# Patient Record
Sex: Female | Born: 1980 | Hispanic: No | Smoking: Never smoker
Health system: Southern US, Community
[De-identification: ages and names within clinical notes are randomized; demographics above are authoritative.]

## PROBLEM LIST (undated history)

## (undated) ENCOUNTER — Inpatient Hospital Stay (HOSPITAL_COMMUNITY): Payer: Self-pay

## (undated) DIAGNOSIS — E119 Type 2 diabetes mellitus without complications: Secondary | ICD-10-CM

## (undated) DIAGNOSIS — O24419 Gestational diabetes mellitus in pregnancy, unspecified control: Secondary | ICD-10-CM

## (undated) DIAGNOSIS — C73 Malignant neoplasm of thyroid gland: Secondary | ICD-10-CM

## (undated) DIAGNOSIS — E7800 Pure hypercholesterolemia, unspecified: Secondary | ICD-10-CM

## (undated) DIAGNOSIS — E0590 Thyrotoxicosis, unspecified without thyrotoxic crisis or storm: Secondary | ICD-10-CM

## (undated) HISTORY — DX: Malignant neoplasm of thyroid gland: C73

## (undated) HISTORY — PX: NO PAST SURGERIES: SHX2092

## (undated) HISTORY — DX: Gestational diabetes mellitus in pregnancy, unspecified control: O24.419

---
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
## 2010-01-26 ENCOUNTER — Ambulatory Visit
Admission: RE | Admit: 2010-01-26 | Payer: Self-pay | Source: Home / Self Care | Attending: Obstetrics & Gynecology | Admitting: Obstetrics & Gynecology

## 2010-01-26 ENCOUNTER — Encounter: Payer: Self-pay | Admitting: Physician Assistant

## 2010-01-26 LAB — CONVERTED CEMR LAB: TSH: 19.331 microintl units/mL — ABNORMAL HIGH (ref 0.350–4.500)

## 2010-03-03 ENCOUNTER — Ambulatory Visit (INDEPENDENT_AMBULATORY_CARE_PROVIDER_SITE_OTHER): Payer: Self-pay | Admitting: Obstetrics and Gynecology

## 2010-03-03 DIAGNOSIS — E039 Hypothyroidism, unspecified: Secondary | ICD-10-CM

## 2013-09-10 ENCOUNTER — Encounter (HOSPITAL_COMMUNITY): Payer: Self-pay | Admitting: Emergency Medicine

## 2013-09-10 ENCOUNTER — Emergency Department (HOSPITAL_COMMUNITY)
Admission: EM | Admit: 2013-09-10 | Discharge: 2013-09-10 | Disposition: A | Discharge status: Home / Self Care | Payer: Self-pay | Attending: Emergency Medicine | Admitting: Emergency Medicine

## 2013-09-10 DIAGNOSIS — Z3202 Encounter for pregnancy test, result negative: Secondary | ICD-10-CM | POA: Insufficient documentation

## 2013-09-10 DIAGNOSIS — R51 Headache: Secondary | ICD-10-CM | POA: Insufficient documentation

## 2013-09-10 DIAGNOSIS — R519 Headache, unspecified: Secondary | ICD-10-CM

## 2013-09-10 DIAGNOSIS — R197 Diarrhea, unspecified: Secondary | ICD-10-CM | POA: Insufficient documentation

## 2013-09-10 DIAGNOSIS — R1084 Generalized abdominal pain: Secondary | ICD-10-CM | POA: Insufficient documentation

## 2013-09-10 DIAGNOSIS — R112 Nausea with vomiting, unspecified: Secondary | ICD-10-CM | POA: Insufficient documentation

## 2013-09-10 DIAGNOSIS — Z79899 Other long term (current) drug therapy: Secondary | ICD-10-CM | POA: Insufficient documentation

## 2013-09-10 LAB — COMPREHENSIVE METABOLIC PANEL
ALBUMIN: 3.9 g/dL (ref 3.5–5.2)
ALT: 31 U/L (ref 0–35)
AST: 34 U/L (ref 0–37)
Alkaline Phosphatase: 84 U/L (ref 39–117)
Anion gap: 17 — ABNORMAL HIGH (ref 5–15)
BUN: 10 mg/dL (ref 6–23)
CHLORIDE: 100 meq/L (ref 96–112)
CO2: 20 meq/L (ref 19–32)
Calcium: 9.9 mg/dL (ref 8.4–10.5)
Creatinine, Ser: 0.73 mg/dL (ref 0.50–1.10)
GFR calc Af Amer: >90 mL/min (ref >90)
Glucose, Bld: 248 mg/dL — ABNORMAL HIGH (ref 70–99)
Potassium: 4.5 mEq/L (ref 3.7–5.3)
SODIUM: 137 meq/L (ref 137–147)
Total Bilirubin: 0.3 mg/dL (ref 0.3–1.2)
Total Protein: 8.3 g/dL (ref 6.0–8.3)

## 2013-09-10 LAB — URINALYSIS, ROUTINE W REFLEX MICROSCOPIC
Bilirubin Urine: NEGATIVE
Glucose, UA: >1000 mg/dL — AB
Ketones, ur: NEGATIVE mg/dL
Nitrite: NEGATIVE
PH: 6 (ref 5.0–8.0)
PROTEIN: NEGATIVE mg/dL
Specific Gravity, Urine: 1.01 (ref 1.005–1.030)
Urobilinogen, UA: 0.2 mg/dL (ref 0.0–1.0)

## 2013-09-10 LAB — URINE MICROSCOPIC-ADD ON

## 2013-09-10 LAB — LIPASE, BLOOD: LIPASE: 32 U/L (ref 11–59)

## 2013-09-10 LAB — POC URINE PREG, ED: Preg Test, Ur: NEGATIVE

## 2013-09-10 MED ORDER — KETOROLAC TROMETHAMINE 30 MG/ML IJ SOLN
15.0000 mg | Freq: Once | INTRAMUSCULAR | Status: AC
  Administered 2013-09-10: 15 mg via INTRAVENOUS
  Filled 2013-09-10: qty 1

## 2013-09-10 MED ORDER — DIPHENHYDRAMINE HCL 50 MG/ML IJ SOLN
25.0000 mg | Freq: Once | INTRAMUSCULAR | Status: AC
  Administered 2013-09-10: 25 mg via INTRAVENOUS
  Filled 2013-09-10: qty 1

## 2013-09-10 MED ORDER — SODIUM CHLORIDE 0.9 % IV BOLUS (SEPSIS)
1000.0000 mL | Freq: Once | INTRAVENOUS | Status: AC
  Administered 2013-09-10: 1000 mL via INTRAVENOUS

## 2013-09-10 MED ORDER — METOCLOPRAMIDE HCL 5 MG/ML IJ SOLN
10.0000 mg | Freq: Once | INTRAMUSCULAR | Status: AC
  Administered 2013-09-10: 10 mg via INTRAVENOUS
  Filled 2013-09-10: qty 2

## 2013-09-10 NOTE — ED Notes (Signed)
Pt c/o headache, generalized body aches, and n/v x 3 days.  Pain score 10/10.  Denies blurred.

## 2013-09-10 NOTE — ED Provider Notes (Signed)
CSN: [id number]     Arrival date & time 09/10/13  2 History   First MD Initiated Contact with Patient 09/10/13 1110     Chief Complaint  Patient presents with  . Headache  . Generalized Body Aches      HPI Patient presents with 3 days of generalized discomfort, abdominal pain, nausea, vomiting, diarrhea. (Symptoms she was in her usual state of health. Patient is generally well, has no recent travel, sick contacts. She does endorse increased intake of pork products. She does not smoke, does not drink. Since onset no relief with OTC medication or any other medications.  History reviewed. No pertinent past medical history. History reviewed. No pertinent past surgical history. History reviewed. No pertinent family history. History  Substance Use Topics  . Smoking status: Never Smoker   . Smokeless tobacco: Not on file  . Alcohol Use: Yes     Comment: occ   OB History   Grav Para Term Preterm Abortions TAB SAB Ect Mult Living                 Review of Systems  Constitutional:       Per HPI, otherwise negative  HENT:       Per HPI, otherwise negative  Respiratory:       Per HPI, otherwise negative  Cardiovascular:       Per HPI, otherwise negative  Gastrointestinal: Positive for nausea, vomiting and diarrhea.  Endocrine:       Negative aside from HPI  Genitourinary:       Neg aside from HPI   Musculoskeletal:       Per HPI, otherwise negative  Skin: Negative.   Neurological: Positive for headaches. Negative for syncope and weakness.      Allergies  Review of patient's allergies indicates no known allergies.  Home Medications   Prior to Admission medications   Medication Sig Start Date End Date Taking? Authorizing Provider  atorvastatin (LIPITOR) 40 MG tablet Take 40 mg by mouth daily.   Yes Historical Provider, MD  metFORMIN (GLUCOPHAGE) 1000 MG tablet Take 1,000 mg by mouth 2 (two) times daily with a meal.   Yes Historical Provider, MD  pioglitazone  (ACTOS) 15 MG tablet Take 15 mg by mouth daily.   Yes Historical Provider, MD   BP 135/80  Pulse 92  Temp(Src) 99.2 F (37.3 C) (Oral)  Resp 16  SpO2 100%  LMP 09/02/2013 Physical Exam  Nursing note and vitals reviewed. Constitutional: She is oriented to person, place, and time. She appears well-developed and well-nourished. No distress.  HENT:  Head: Normocephalic and atraumatic.  Eyes: Conjunctivae and EOM are normal.  Cardiovascular: Normal rate and regular rhythm.   Pulmonary/Chest: Effort normal and breath sounds normal. No stridor. No respiratory distress.  Abdominal: She exhibits no distension. There is no hepatosplenomegaly. There is generalized tenderness. There is no rigidity and no guarding.  Patient is a soft, non-peritoneal abdomen, there is mild pain with palpation throughout  Musculoskeletal: She exhibits no edema.  Neurological: She is alert and oriented to person, place, and time. She displays no atrophy and no tremor. No cranial nerve deficit or sensory deficit. She exhibits normal muscle tone. She displays no seizure activity. Coordination normal.  Skin: Skin is warm and dry.  Psychiatric: She has a normal mood and affect.    ED Course  Procedures (including critical care time) Labs Review Labs Reviewed  COMPREHENSIVE METABOLIC PANEL - Abnormal; Notable for the following:  Glucose, Bld 248 (*)    Anion gap 17 (*)    All other components within normal limits  URINALYSIS, ROUTINE W REFLEX MICROSCOPIC - Abnormal; Notable for the following:    Glucose, UA >1000 (*)    Hgb urine dipstick MODERATE (*)    Leukocytes, UA TRACE (*)    All other components within normal limits  LIPASE, BLOOD  URINE MICROSCOPIC-ADD ON  POC URINE PREG, ED     1:27 PM Patient substantially improved, no ongoing pain.  MDM   Patient presents with headache, no neurologic dysfunction, no other focal complaints.  Patient has substantial improvement here, and retroflex for occult  neurologic cause, nor systemic infection.  Patient was discharged in stable condition  Carmin Muskrat, MD 09/10/13 1328

## 2013-09-17 ENCOUNTER — Emergency Department (HOSPITAL_COMMUNITY): Payer: Self-pay

## 2013-09-17 ENCOUNTER — Emergency Department (HOSPITAL_COMMUNITY)
Admission: EM | Admit: 2013-09-17 | Discharge: 2013-09-17 | Disposition: A | Discharge status: Home / Self Care | Payer: Self-pay | Attending: Emergency Medicine | Admitting: Emergency Medicine

## 2013-09-17 ENCOUNTER — Encounter (HOSPITAL_COMMUNITY): Payer: Self-pay | Admitting: Emergency Medicine

## 2013-09-17 DIAGNOSIS — R109 Unspecified abdominal pain: Secondary | ICD-10-CM | POA: Insufficient documentation

## 2013-09-17 DIAGNOSIS — Z3202 Encounter for pregnancy test, result negative: Secondary | ICD-10-CM | POA: Insufficient documentation

## 2013-09-17 DIAGNOSIS — Z792 Long term (current) use of antibiotics: Secondary | ICD-10-CM | POA: Insufficient documentation

## 2013-09-17 DIAGNOSIS — Z79899 Other long term (current) drug therapy: Secondary | ICD-10-CM | POA: Insufficient documentation

## 2013-09-17 DIAGNOSIS — E119 Type 2 diabetes mellitus without complications: Secondary | ICD-10-CM | POA: Insufficient documentation

## 2013-09-17 DIAGNOSIS — R110 Nausea: Secondary | ICD-10-CM | POA: Insufficient documentation

## 2013-09-17 DIAGNOSIS — E7800 Pure hypercholesterolemia, unspecified: Secondary | ICD-10-CM | POA: Insufficient documentation

## 2013-09-17 DIAGNOSIS — N289 Disorder of kidney and ureter, unspecified: Secondary | ICD-10-CM | POA: Insufficient documentation

## 2013-09-17 HISTORY — DX: Pure hypercholesterolemia, unspecified: E78.00

## 2013-09-17 HISTORY — DX: Type 2 diabetes mellitus without complications: E11.9

## 2013-09-17 LAB — URINE MICROSCOPIC-ADD ON

## 2013-09-17 LAB — COMPREHENSIVE METABOLIC PANEL
ALT: 20 U/L (ref 0–35)
AST: 9 U/L (ref 0–37)
Albumin: 3.9 g/dL (ref 3.5–5.2)
Alkaline Phosphatase: 92 U/L (ref 39–117)
Anion gap: 17 — ABNORMAL HIGH (ref 5–15)
BUN: 8 mg/dL (ref 6–23)
CO2: 27 mEq/L (ref 19–32)
Calcium: 10.2 mg/dL (ref 8.4–10.5)
Chloride: 100 mEq/L (ref 96–112)
Creatinine, Ser: 1.23 mg/dL — ABNORMAL HIGH (ref 0.50–1.10)
GFR calc Af Amer: 67 mL/min — ABNORMAL LOW (ref >90)
GFR calc non Af Amer: 58 mL/min — ABNORMAL LOW (ref >90)
Glucose, Bld: 223 mg/dL — ABNORMAL HIGH (ref 70–99)
Potassium: 3.1 mEq/L — ABNORMAL LOW (ref 3.7–5.3)
Sodium: 144 mEq/L (ref 137–147)
Total Bilirubin: 1 mg/dL (ref 0.3–1.2)
Total Protein: 8.1 g/dL (ref 6.0–8.3)

## 2013-09-17 LAB — URINALYSIS, ROUTINE W REFLEX MICROSCOPIC
Bilirubin Urine: NEGATIVE
Glucose, UA: NEGATIVE mg/dL
Ketones, ur: NEGATIVE mg/dL
Leukocytes, UA: NEGATIVE
Nitrite: NEGATIVE
Protein, ur: NEGATIVE mg/dL
Specific Gravity, Urine: 1.003 — ABNORMAL LOW (ref 1.005–1.030)
Urobilinogen, UA: 0.2 mg/dL (ref 0.0–1.0)
pH: 6.5 (ref 5.0–8.0)

## 2013-09-17 LAB — CBC WITH DIFFERENTIAL/PLATELET
Basophils Absolute: 0 10*3/uL (ref 0.0–0.1)
Basophils Relative: 0 % (ref 0–1)
Eosinophils Absolute: 0.1 10*3/uL (ref 0.0–0.7)
Eosinophils Relative: 1 % (ref 0–5)
HCT: 36.7 % (ref 36.0–46.0)
Hemoglobin: 12.6 g/dL (ref 12.0–15.0)
Lymphocytes Relative: 29 % (ref 12–46)
Lymphs Abs: 3.3 10*3/uL (ref 0.7–4.0)
MCH: 29.2 pg (ref 26.0–34.0)
MCHC: 34.3 g/dL (ref 30.0–36.0)
MCV: 85.2 fL (ref 78.0–100.0)
Monocytes Absolute: 0.6 10*3/uL (ref 0.1–1.0)
Monocytes Relative: 5 % (ref 3–12)
Neutro Abs: 7.4 10*3/uL (ref 1.7–7.7)
Neutrophils Relative %: 65 % (ref 43–77)
Platelets: 313 10*3/uL (ref 150–400)
RBC: 4.31 MIL/uL (ref 3.87–5.11)
RDW: 12 % (ref 11.5–15.5)
WBC: 11.5 10*3/uL — ABNORMAL HIGH (ref 4.0–10.5)

## 2013-09-17 LAB — POC URINE PREG, ED: Preg Test, Ur: NEGATIVE

## 2013-09-17 LAB — LIPASE, BLOOD: Lipase: 32 U/L (ref 11–59)

## 2013-09-17 MED ORDER — IOHEXOL 300 MG/ML  SOLN
100.0000 mL | Freq: Once | INTRAMUSCULAR | Status: AC | PRN
  Administered 2013-09-17: 100 mL via INTRAVENOUS

## 2013-09-17 MED ORDER — PROMETHAZINE HCL 25 MG/ML IJ SOLN
12.5000 mg | Freq: Once | INTRAMUSCULAR | Status: AC
  Administered 2013-09-17: 12.5 mg via INTRAVENOUS
  Filled 2013-09-17: qty 1

## 2013-09-17 MED ORDER — ONDANSETRON HCL 4 MG PO TABS: 4.0000 mg | ORAL_TABLET | Freq: Three times a day (TID) | ORAL | Status: DC | PRN

## 2013-09-17 MED ORDER — ONDANSETRON HCL 4 MG/2ML IJ SOLN
4.0000 mg | Freq: Once | INTRAMUSCULAR | Status: AC
  Administered 2013-09-17: 4 mg via INTRAVENOUS
  Filled 2013-09-17: qty 2

## 2013-09-17 MED ORDER — SODIUM CHLORIDE 0.9 % IV BOLUS (SEPSIS)
1000.0000 mL | Freq: Once | INTRAVENOUS | Status: AC
  Administered 2013-09-17: 1000 mL via INTRAVENOUS

## 2013-09-17 MED ORDER — MORPHINE SULFATE 4 MG/ML IJ SOLN
6.0000 mg | Freq: Once | INTRAMUSCULAR | Status: AC
  Administered 2013-09-17: 6 mg via INTRAVENOUS
  Filled 2013-09-17: qty 2

## 2013-09-17 MED ORDER — POTASSIUM CHLORIDE CRYS ER 20 MEQ PO TBCR
60.0000 meq | EXTENDED_RELEASE_TABLET | Freq: Once | ORAL | Status: DC
  Filled 2013-09-17: qty 3

## 2013-09-17 MED ORDER — POTASSIUM CHLORIDE 10 MEQ/100ML IV SOLN
10.0000 meq | INTRAVENOUS | Status: AC
  Administered 2013-09-17 (×2): 10 meq via INTRAVENOUS
  Filled 2013-09-17 (×2): qty 100

## 2013-09-17 MED ORDER — OXYCODONE HCL 5 MG PO TABS: 5.0000 mg | ORAL_TABLET | ORAL | Status: DC | PRN

## 2013-09-17 NOTE — Discharge Instructions (Signed)
Stop taking Lipitor at this time until you discuss further with your PCP. Do not take NSAIDs such a ibuprofen, naproxen, etc. You are being prescribed a pain medication to take as needed. Stop taking amoxicillin. Your urine today is not consistent with a urinary tract infection. You need to have repeat blood work(basic metabolic panel) by the end of next week. If you develop swelling, decreased urination or other symptoms as discussed you need to be re-evaluated as soon as you can. Otherwise you need to make an appointment to see your PCP. If you do not have one then please see attached resource list and make an appointment with the [facility] and [place].    Emergency Department Resource Guide 1) Find a Doctor and Pay Out of Pocket Although you won't have to find out who is covered by your insurance plan, it is a good idea to ask around and get recommendations. You will then need to call the office and see if the doctor you have chosen will accept you as a new patient and what types of options they offer for patients who are self-pay. Some doctors offer discounts or will set up payment plans for their patients who do not have insurance, but you will need to ask so you aren't surprised when you get to your appointment.  2) Contact Your Local Health Department Not all health departments have doctors that can see patients for sick visits, but many do, so it is worth a call to see if yours does. If you don't know where your local health department is, you can check in your phone book. The CDC also has a tool to help you locate your state's health department, and many state websites also have listings of all of their local health departments.  3) Find a [facility] If your illness is not likely to be very severe or complicated, you may want to try a walk in clinic. These are popping up all over the country in pharmacies, drugstores, and shopping centers. They're usually staffed by nurse  practitioners or physician assistants that have been trained to treat common illnesses and complaints. They're usually fairly quick and inexpensive. However, if you have serious medical issues or chronic medical problems, these are probably not your best option.  No Primary Care Doctor: - Call Health Connect at  [phone] - they can help you locate a primary care doctor that  accepts your insurance, provides certain services, etc. - Physician Referral Service- [phone]  Chronic Pain Problems: Organization         Address  Phone   Notes  [facility]  [phone] Patients need to be referred by their primary care doctor.   Medication Assistance: Organization         Address  Phone   Notes  [facility] Medication [facility] [address] [place], [place] [address] [phone] --Must be a resident of [facility] -- Must have NO insurance coverage whatsoever (no Medicaid/ Medicare, etc.) -- The pt. MUST have a primary care doctor that directs their care regularly and follows them in the community   MedAssist  [phone]   [employer]  [phone]    Agencies that provide inexpensive medical care: Organization         Address  Phone   Notes  [place]  [phone]   [facility]    [phone]   [facility] [place], [place] [address] [phone]   [place] [facility] [address], [place] [phone]   Planned Parenthood    [phone]   [facility]    [phone]   [facility] [address], [place] Phone:  [phone], Fax:  [phone] Hours of Operation:  9 am - 6 pm, M-F.  Also accepts Medicaid/Medicare and self-pay.  [facility] for [place] [address], [address], [place] Phone: [phone], Fax: [phone]. Hours of Operation:  8:30 am -  5:30 pm, M-F.  Also accepts Medicaid and self-pay.  [facility] [place] [address] [place] Phone: [phone]   [place], [place], [place] [phone], Ext. [phone] Mondays & Thursdays: 7-9 AM.  First 15 patients are seen on a first come, first serve basis.    [place] Providers:  Organization         Address  Phone   Notes  [facility] [address], [place] [phone] Also accepts self-pay patients.  [facility] [address], [place]  [phone]   [place], [address], [place] [phone]   [facility] [address], [place] [phone]   Lucianne Lei [address], [place]   [phone] Only accepts [place] Access [place] patients after they have their name applied to their card.   Self-Pay (no insurance) in [facility]:  Organization         Address  Phone   Notes  Sickle Cell Patients, [facility] [place] [phone]   [facility] [place] [phone]   [facility] [place]  [place], [place], [place] [phone]   [facility]/Dr. Osei-Bonsu  [address] [place] or [address], [place] [phone] Phone number for both [place] and [place] locations is the same.  Urgent Medical and [facility] [address] [place] [phone]   [facility] [address] [place] or [address] [phone]   [facility] [address] [place] [phone], phone; [phone], fax Sees patients 1st and 3rd Saturday of every month.  Must not qualify for public or private insurance (i.e. Medicaid, Medicare, [place] Health Choice,  Veterans' Benefits)  Household income should be no more than 200% of the poverty level The clinic cannot treat you if you are pregnant or think you are pregnant  Sexually transmitted diseases are not treated at the clinic.    Dental Care: Organization         Address  Phone  Notes  [facility] Department of [facility] [phone] Accepts children up to age 5 who are enrolled in [place] or [place]; pregnant women with a Medicaid card; and children who have applied for Medicaid or [place] Health Choice, but were declined, whose parents can pay a reduced fee at time of service.  [facility] Department of [facility]  [address], [place] [phone] Accepts children up to age 38 who are enrolled in  Medicaid or [place] Health Choice; pregnant women with a Medicaid card; and children who have applied for Medicaid or [place] Health Choice, but were declined, whose parents can pay a reduced fee at time of service.  [facility] Adult Dental Access PROGRAM  [place] [phone] Patients are seen by appointment only. Walk-ins are not accepted. [place] will see patients 41 years of age and older. Monday - Tuesday (8am-5pm) Most Wednesdays (8:30-5pm) $30 per visit, cash only  [facility] Adult Dental Access PROGRAM  [address], [facility] [phone] Patients are seen by appointment only. Walk-ins are not accepted. [place] will see patients 40 years of age and older. One Wednesday Evening (Monthly: Volunteer Based).  $30 per visit, cash only  [place]  [phone] for adults; Children under age 6, call Graduate Pediatric Dentistry at [phone]. Children aged 37-14, please call [phone] to request a pediatric application.  Dental services are provided in all areas of dental care including fillings, crowns and bridges, complete and  partial dentures, implants, gum treatment, root canals, and extractions. Preventive care is also provided. Treatment is provided to both adults and children. Patients are selected via a lottery and there is often a waiting list.   [facility] [address], [place]  [phone] [email or web address]   [facility] [address] [place], [place] [phone], Ext. [phone] Second and Fourth Thursday of each month, opens at 6:30 AM; Clinic ends at 9 AM.  Patients are seen on a first-come first-served basis, and a limited number are seen during each clinic.   [facility]  [address] Hillard Danker [place], [place] [phone]   Eligibility Requirements You must have lived in [place], [place], or [place] counties for at least the last three months.   You cannot be eligible for state or federal sponsored [employer], including [employer], [place], or [employer].   You generally cannot be eligible for healthcare insurance through your employer.    How to apply: Eligibility screenings are held every Tuesday and Wednesday afternoon from 1:00 pm until 4:00 pm. You do not need an appointment for the interview!  [facility] [address] [place], [place]   [place]  [place] Department  [place]  [phone]    Behavioral Health Resources in the Community: Intensive Outpatient Programs Organization         Address  Phone  Notes  [place]. [address], [place], [place] [phone]   [facility] Outpatient [address] [place], [place]   ADS: Alcohol & Drug Svcs [address], [place], [place]   [place] [address],  [place], [place] or [phone]   Substance Abuse Resources Organization          Address  Phone  Notes  Alcohol and Drug Services  [phone]   [place]  [phone]   [facility]   Chinita Pester  [phone]   Residential & Outpatient Substance Abuse Program  [phone]   Psychological Services Organization         Address  Phone  Notes  [facility] [place]  [place]  [phone]   [place] [address] [place] or  [phone]    Mobile Crisis Teams Organization         Address  Phone  Notes  [facility], Mobile Crisis Care Unit  [phone]   [facility]  [address] [place], [place]   [facility] [address], [place] [phone]    Self-Help/Support Groups Organization         Address  Phone             Notes  [place]. of [place] - variety of support groups  [place] Call for more information  Narcotics Anonymous (NA), [facility] [address], [employer] [place]  2 meetings at this location   Special educational needs teacher         Address  Phone  Notes  ASAP Residential Treatment [place],    [place]  [phone]   [facility]  [address], [place] [address], [place], [place]   [facility] [place], [place] [phone] Admissions: 8am-3pm M-F  Incentives Substance [place] [address],    [place], [place] [phone]   [facility] [address] [place], [place], [place]   [facility] [address],  [place], [place]   [facility] Programs - Intensive Outpatient [address] Kristeen [address], [place], [place]   [facility] ([place].,  [place], [place] [phone] or [phone]   [facility]idential Treatment Services (RTS) [address], [place], [place] Accepts Medicaid  [facility] [place] [address],  [place] [phone] Substance Abuse/Addiction Treatment   [facility] Organization         Address  Phone  Notes  [facility]  [phone]   Domenic Schwab, PhD [address] Arlis Porta [place], [place]   [phone] or [phone]   [place], [place] [phone]   [facility] [address] [place], [place] [phone] Insurance/Medicaid/sponsorship through [facility] and Families [address]                                    [place], [place] [phone] [place] [address], [place] [phone]    Dr. Adele Schilder  [phone]   [facility] Dept. 1) [address], [place] 2) [place] 3)  [address], [place] [phone]  [phone]   [place] [phone] or [phone] (After Hours)

## 2013-09-17 NOTE — ED Notes (Signed)
Patient transported to CT 

## 2013-09-17 NOTE — ED Notes (Signed)
Pt reports abdominal pain x2 weeks. Pain 10/10. Reports she vomited yesterday.

## 2013-09-17 NOTE — ED Notes (Signed)
Pt reports intermittent abdominal pain for 2 weeks. Pt reports normal BM 2 days ago. Pt reports nausea/vomiting 4 days ago but occuring again starting yesterday. Pt reports two episodes of vomiting since. Pt denies blood in stool or vomit. Pt denies GU complaint.

## 2013-09-17 NOTE — ED Provider Notes (Signed)
CSN: [id number]     Arrival date & time 09/17/13  1602 History   First MD Initiated Contact with Patient 09/17/13 1722     Chief Complaint  Patient presents with  . Abdominal Pain     (Consider location/radiation/quality/duration/timing/severity/associated sxs/prior Treatment) HPI  32yF with abdominal pain and n/v. Gradual onset about 2 weeks ago. Diffuse, but within past couple days feeling more in b/ flank. Constant. No appreciable exacerbating or relieving factors. Associated with intermittent n/v. Diarrhea initially which has resolved. No blood in stool. No fever. No urinary complaints. No sick contacts. No rash. Hx of DM. No significant surgical hx. Doesn't think she she is pregnant. No unusual vaginal bleeding or discharge.   Past Medical History  Diagnosis Date  . Diabetes mellitus without complication   . High cholesterol    History reviewed. No pertinent past surgical history. History reviewed. No pertinent family history. History  Substance Use Topics  . Smoking status: Never Smoker   . Smokeless tobacco: Not on file  . Alcohol Use: Yes     Comment: occ   OB History   Grav Para Term Preterm Abortions TAB SAB Ect Mult Living                 Review of Systems  All systems reviewed and negative, other than as noted in HPI.   Allergies  Review of patient's allergies indicates no known allergies.  Home Medications   Prior to Admission medications   Medication Sig Start Date End Date Taking? Authorizing Provider  amoxicillin (AMOXIL) 500 MG capsule Take 1,000 mg by mouth 2 (two) times daily. For 10 days. 09/16/13  Yes Historical Provider, MD  atorvastatin (LIPITOR) 40 MG tablet Take 40 mg by mouth daily.   Yes Historical Provider, MD  metFORMIN (GLUCOPHAGE) 1000 MG tablet Take 1,000 mg by mouth 2 (two) times daily with a meal.   Yes Historical Provider, MD  omeprazole (PRILOSEC) 20 MG capsule Take 20 mg by mouth 2 (two) times daily before a meal.   Yes Historical  Provider, MD  ondansetron (ZOFRAN) 4 MG tablet Take 4 mg by mouth every 6 (six) hours as needed for nausea or vomiting.   Yes Historical Provider, MD  pioglitazone (ACTOS) 15 MG tablet Take 15 mg by mouth daily.   Yes Historical Provider, MD   BP 157/79  Pulse 74  Temp(Src) 98.4 F (36.9 C) (Oral)  Resp 18  SpO2 99%  LMP 09/02/2013 Physical Exam  Nursing note and vitals reviewed. Constitutional: She appears well-developed and well-nourished. No distress.  HENT:  Head: Normocephalic and atraumatic.  Eyes: Conjunctivae are normal. Right eye exhibits no discharge. Left eye exhibits no discharge.  Neck: Neck supple.  Cardiovascular: Normal rate, regular rhythm and normal heart sounds.  Exam reveals no gallop and no friction rub.   No murmur heard. Pulmonary/Chest: Effort normal and breath sounds normal. No respiratory distress.  Abdominal: Soft. She exhibits no distension. There is tenderness.  Mild diffuse tenderness. No rebound or guarding. No distension.   Genitourinary:  No cva tenderness  Musculoskeletal: She exhibits no edema and no tenderness.  Neurological: She is alert.  Skin: Skin is warm and dry.  Psychiatric: She has a normal mood and affect. Her behavior is normal. Thought content normal.    ED Course  Procedures (including critical care time) Labs Review Labs Reviewed  COMPREHENSIVE METABOLIC PANEL - Abnormal; Notable for the following:    Potassium 3.1 (*)    Glucose, Bld 223 (*)  Creatinine, Ser 1.23 (*)    GFR calc non Af Amer 58 (*)    GFR calc Af Amer 67 (*)    Anion gap 17 (*)    All other components within normal limits  CBC WITH DIFFERENTIAL - Abnormal; Notable for the following:    WBC 11.5 (*)    All other components within normal limits  URINALYSIS, ROUTINE W REFLEX MICROSCOPIC - Abnormal; Notable for the following:    Specific Gravity, Urine 1.003 (*)    Hgb urine dipstick TRACE (*)    All other components within normal limits  LIPASE, BLOOD   URINE MICROSCOPIC-ADD ON  POC URINE PREG, ED    Imaging Review Ct Abdomen Pelvis W Contrast  09/17/2013   CLINICAL DATA:  ABDOMINAL PAIN nausea. Fever. RIGHT lower quadrant pain.  EXAM: CT ABDOMEN AND PELVIS WITH CONTRAST  TECHNIQUE: Multidetector CT imaging of the abdomen and pelvis was performed using the standard protocol following bolus administration of intravenous contrast.  CONTRAST:  128mL OMNIPAQUE IOHEXOL 300 MG/ML  SOLN  COMPARISON:  None.  FINDINGS: Musculoskeletal:  Normal.  Lung Bases: Dependent atelectasis.  Liver: Normal. Fatty infiltration adjacent to the falciform ligament of the liver.  Spleen:  Normal.  Gallbladder:  Normal.  Common bile duct:  Normal.  Pancreas:  Normal.  Adrenal glands:  Normal.  Kidneys: Nonspecific patchy enhancement and apparent enlargement of the kidneys bilaterally. This could be associated with ascending urinary tract infection and pyelonephritis. Pyelonephritis is typically more striated in appearance. LEFT kidney measures 14 cm long axis. Both ureters appear within normal limits.  Stomach:  Normal.  Small bowel:  Normal.  Colon:   Normal appendix.  No inflammatory changes of colon.  Pelvic Genitourinary:  Normal.  Vasculature: Normal.  Body Wall: Normal.  IMPRESSION: Patchy bilateral renal enhancement with bilateral renal enlargement. The appearance is most commonly associated with ascending urinary tract infection and pyelonephritis. Correlation with urinalysis recommended. The kidneys both appear enlarged with less perinephric stranding than expected for pyelonephritis and noninfectious causes of nephritis should be considered.   Electronically Signed   By: Dereck Ligas M.D.   On: 09/17/2013 20:32     EKG Interpretation None      MDM   Final diagnoses:  Abdominal pain, unspecified abdominal location  Nausea  Renal insufficiency    32yF with 2 weeks of crampy abdominal pain, worse within the past few days and now radiating into b/l flank.  Nausea/vomiting and diarrhea. Fatigue. CT as above. Dose have mild elevation in Cr. BUN normal. Blood noted on UA but no protein. No complaints of swelling. Doesn't appear volume overloaded. UA today and one week ago not consistent with infection, but reports also going to an Urgent Care in between visits and being started on amoxicillin for UTI. I do not have access to those records. No recent sore throat. Denies hx of known autoimmune disease. No rash. No recent procedures.   Pt doesn't not appear ill. Only mild renal impairment. Doesn't appear volume overloaded. Reports no appreciable decline in urine output. Reviewed medications with patient. Will have stop statin at this time until can follow-up with PCP or potentially nephrology. Told to avoid NSAIDs. Based on her urine from a week ago and again today, it does not appear she has a UTI. Will check a culture today. Instructed to stop amoxicillin. Needs repeat blood work in a few days to a week to assess renal function   Virgel Manifold, MD 09/23/13 1249

## 2013-09-19 LAB — URINE CULTURE
Colony Count: NO GROWTH
Culture: NO GROWTH

## 2015-01-16 ENCOUNTER — Emergency Department (HOSPITAL_COMMUNITY)
Admission: EM | Admit: 2015-01-16 | Discharge: 2015-01-16 | Disposition: A | Discharge status: Home / Self Care | Payer: Self-pay | Attending: Emergency Medicine | Admitting: Emergency Medicine

## 2015-01-16 ENCOUNTER — Encounter (HOSPITAL_COMMUNITY): Payer: Self-pay | Admitting: Emergency Medicine

## 2015-01-16 ENCOUNTER — Emergency Department (HOSPITAL_COMMUNITY): Payer: Self-pay

## 2015-01-16 DIAGNOSIS — F10929 Alcohol use, unspecified with intoxication, unspecified: Secondary | ICD-10-CM

## 2015-01-16 DIAGNOSIS — E1165 Type 2 diabetes mellitus with hyperglycemia: Secondary | ICD-10-CM | POA: Insufficient documentation

## 2015-01-16 DIAGNOSIS — R1110 Vomiting, unspecified: Secondary | ICD-10-CM | POA: Insufficient documentation

## 2015-01-16 DIAGNOSIS — F10129 Alcohol abuse with intoxication, unspecified: Secondary | ICD-10-CM | POA: Insufficient documentation

## 2015-01-16 DIAGNOSIS — Z79899 Other long term (current) drug therapy: Secondary | ICD-10-CM | POA: Insufficient documentation

## 2015-01-16 DIAGNOSIS — R739 Hyperglycemia, unspecified: Secondary | ICD-10-CM

## 2015-01-16 LAB — I-STAT CHEM 8, ED
CALCIUM ION: 1.05 mmol/L — AB (ref 1.12–1.23)
CREATININE: 0.4 mg/dL — AB (ref 0.44–1.00)
Chloride: 111 mmol/L (ref 101–111)
GLUCOSE: 315 mg/dL — AB (ref 65–99)
HEMATOCRIT: 34 % — AB (ref 36.0–46.0)
HEMOGLOBIN: 11.6 g/dL — AB (ref 12.0–15.0)
Potassium: 3.7 mmol/L (ref 3.5–5.1)
Sodium: 144 mmol/L (ref 135–145)
TCO2: 17 mmol/L (ref 0–100)

## 2015-01-16 LAB — CBG MONITORING, ED
GLUCOSE-CAPILLARY: 268 mg/dL — AB (ref 65–99)
Glucose-Capillary: 385 mg/dL — ABNORMAL HIGH (ref 65–99)

## 2015-01-16 LAB — URINALYSIS, ROUTINE W REFLEX MICROSCOPIC
Bilirubin Urine: NEGATIVE
Glucose, UA: >1000 mg/dL — AB
KETONES UR: 40 mg/dL — AB
LEUKOCYTES UA: NEGATIVE
Nitrite: NEGATIVE
PROTEIN: NEGATIVE mg/dL
Specific Gravity, Urine: 1.029 (ref 1.005–1.030)
pH: 5.5 (ref 5.0–8.0)

## 2015-01-16 LAB — BASIC METABOLIC PANEL
ANION GAP: 12 (ref 5–15)
Anion gap: 19 — ABNORMAL HIGH (ref 5–15)
BUN: <5 mg/dL — ABNORMAL LOW (ref 6–20)
CALCIUM: 8.8 mg/dL — AB (ref 8.9–10.3)
CALCIUM: 7.6 mg/dL — AB (ref 8.9–10.3)
CO2: 17 mmol/L — ABNORMAL LOW (ref 22–32)
CO2: 19 mmol/L — AB (ref 22–32)
CREATININE: 0.6 mg/dL (ref 0.44–1.00)
Chloride: 105 mmol/L (ref 101–111)
Chloride: 111 mmol/L (ref 101–111)
Creatinine, Ser: 0.44 mg/dL (ref 0.44–1.00)
GFR calc Af Amer: >60 mL/min (ref >60)
GFR calc Af Amer: >60 mL/min (ref >60)
GFR calc non Af Amer: >60 mL/min (ref >60)
GLUCOSE: 309 mg/dL — AB (ref 65–99)
Glucose, Bld: 421 mg/dL — ABNORMAL HIGH (ref 65–99)
Potassium: 3.9 mmol/L (ref 3.5–5.1)
Potassium: 3.6 mmol/L (ref 3.5–5.1)
Sodium: 141 mmol/L (ref 135–145)
Sodium: 142 mmol/L (ref 135–145)

## 2015-01-16 LAB — BLOOD GAS, VENOUS
Acid-base deficit: 7.6 mmol/L — ABNORMAL HIGH (ref 0.0–2.0)
Bicarbonate: 17.4 mEq/L — ABNORMAL LOW (ref 20.0–24.0)
O2 Saturation: 83.8 %
PATIENT TEMPERATURE: 98.6
PCO2 VEN: 35.5 mmHg — AB (ref 45.0–50.0)
PH VEN: 7.312 — AB (ref 7.250–7.300)
TCO2: 15.4 mmol/L (ref 0–100)
pO2, Ven: 54.3 mmHg — ABNORMAL HIGH (ref 30.0–45.0)

## 2015-01-16 LAB — CBC
HCT: 36.7 % (ref 36.0–46.0)
Hemoglobin: 12.7 g/dL (ref 12.0–15.0)
MCH: 30.2 pg (ref 26.0–34.0)
MCHC: 34.6 g/dL (ref 30.0–36.0)
MCV: 87.2 fL (ref 78.0–100.0)
PLATELETS: 280 10*3/uL (ref 150–400)
RBC: 4.21 MIL/uL (ref 3.87–5.11)
RDW: 13.1 % (ref 11.5–15.5)
WBC: 5.5 10*3/uL (ref 4.0–10.5)

## 2015-01-16 LAB — URINE MICROSCOPIC-ADD ON
Bacteria, UA: NONE SEEN
WBC UA: NONE SEEN WBC/hpf (ref 0–5)

## 2015-01-16 LAB — ETHANOL: ALCOHOL ETHYL (B): 195 mg/dL — AB (ref <5)

## 2015-01-16 MED ORDER — SODIUM CHLORIDE 0.9 % IV BOLUS (SEPSIS)
2000.0000 mL | Freq: Once | INTRAVENOUS | Status: AC
  Administered 2015-01-16: 2000 mL via INTRAVENOUS

## 2015-01-16 MED ORDER — SODIUM CHLORIDE 0.9 % IV BOLUS (SEPSIS)
1000.0000 mL | Freq: Once | INTRAVENOUS | Status: AC
  Administered 2015-01-16: 1000 mL via INTRAVENOUS

## 2015-01-16 MED ORDER — ONDANSETRON HCL 4 MG/2ML IJ SOLN
4.0000 mg | Freq: Once | INTRAMUSCULAR | Status: AC
  Administered 2015-01-16: 4 mg via INTRAVENOUS
  Filled 2015-01-16: qty 2

## 2015-01-16 NOTE — ED Notes (Signed)
ED PA at bedside

## 2015-01-16 NOTE — ED Provider Notes (Signed)
CSN: [id number]     Arrival date & time 01/16/15  [id number] History   First MD Initiated Contact with Patient 01/16/15 [phone]     Chief Complaint  Patient presents with  . Hyperglycemia  . Alcohol Intoxication   (Consider location/radiation/quality/duration/timing/severity/associated sxs/prior Treatment) Patient is a 34 y.o. female presenting with hyperglycemia and intoxication. The history is provided by the patient and the spouse. No language interpreter was used.  Hyperglycemia Associated symptoms: vomiting   Associated symptoms: no fever   Alcohol Intoxication Associated symptoms include vomiting. Pertinent negatives include no fever.  [name] is a 34 y.o female with a past medical history of diabetes and hyperlipidemia who presents via EMS from home complaining of hyperglycemia and alcohol intoxication. Her husband states she had tequila shots since 9 PM last night and his last drink was almost 3 hours ago. She was shaking at home and he was worried that her sugar may have been high. He reports that he vomited multiple times in the ED. The patient tells me she is in no pain now and has no nausea. She states that she takes her insulin regularly and took it yesterday. She denies drinking alcohol regularly. She denies any loss of consciousness, fall, injury, chest pain, shortness of breath, abdominal pain, diarrhea.  Past Medical History  Diagnosis Date  . Diabetes mellitus without complication ([place])   . High cholesterol    History reviewed. No pertinent past surgical history. History reviewed. No pertinent family history. Social History  Substance Use Topics  . Smoking status: Never Smoker   . Smokeless tobacco: None  . Alcohol Use: Yes     Comment: occ   OB History    No data available     Review of Systems  Constitutional: Negative for fever.  Gastrointestinal: Positive for vomiting.  All other systems reviewed and are negative.     Allergies  Review of  patient's allergies indicates no known allergies.  Home Medications   Prior to Admission medications   Medication Sig Start Date End Date Taking? Authorizing Provider  levothyroxine (SYNTHROID, LEVOTHROID) 150 MCG tablet Take 150 mcg by mouth daily. 12/20/14  Yes Historical Provider, MD  pioglitazone (ACTOS) 15 MG tablet Take 15 mg by mouth daily.   Yes Historical Provider, MD  TOUJEO SOLOSTAR 300 UNIT/ML SOPN Inject 20 Units into the skin at bedtime. 11/02/14  Yes Historical Provider, MD   BP 112/65 mmHg  Pulse 86  Temp(Src) 98.1 F (36.7 C) (Oral)  Resp 20  SpO2 99%  LMP  Physical Exam  Constitutional: She is oriented to person, place, and time. She appears well-developed and well-nourished.  HENT:  Head: Normocephalic and atraumatic.  Dry mucous membranes.  Eyes: Conjunctivae are normal.  Neck: Normal range of motion. Neck supple.  Cardiovascular: Normal rate, regular rhythm and normal heart sounds.   Regular rate and rhythm. No murmur.  Pulmonary/Chest: Effort normal and breath sounds normal. She has no decreased breath sounds. She has no wheezes.  Clear to auscultation bilaterally.  Abdominal: Soft. There is no tenderness.  No abdominal tenderness to palpation. Obese abdomen. No distention. No guarding or rebound. No   Musculoskeletal: Normal range of motion.  Neurological: She is alert and oriented to person, place, and time.  Skin: Skin is warm and dry.  Nursing note and vitals reviewed.   ED Course  Procedures (including critical care time) Labs Review Labs Reviewed  BASIC METABOLIC PANEL - Abnormal; Notable for the following:    CO2 17 (*)  Glucose, Bld 421 (*)    BUN <5 (*)    Calcium 8.8 (*)    Anion gap 19 (*)    All other components within normal limits  URINALYSIS, ROUTINE W REFLEX MICROSCOPIC (NOT AT [facility]) - Abnormal; Notable for the following:    Glucose, UA >1000 (*)    Hgb urine dipstick LARGE (*)    Ketones, ur 40 (*)    All other components  within normal limits  ETHANOL - Abnormal; Notable for the following:    Alcohol, Ethyl (B) 195 (*)    All other components within normal limits  BLOOD GAS, VENOUS - Abnormal; Notable for the following:    pH, Ven 7.312 (*)    pCO2, Ven 35.5 (*)    pO2, Ven 54.3 (*)    Bicarbonate 17.4 (*)    Acid-base deficit 7.6 (*)    All other components within normal limits  URINE MICROSCOPIC-ADD ON - Abnormal; Notable for the following:    Squamous Epithelial / LPF 0-5 (*)    All other components within normal limits  BASIC METABOLIC PANEL - Abnormal; Notable for the following:    CO2 19 (*)    Glucose, Bld 309 (*)    BUN <5 (*)    Calcium 7.6 (*)    All other components within normal limits  CBG MONITORING, ED - Abnormal; Notable for the following:    Glucose-Capillary 385 (*)    All other components within normal limits  I-STAT CHEM 8, ED - Abnormal; Notable for the following:    BUN <3 (*)    Creatinine, Ser 0.40 (*)    Glucose, Bld 315 (*)    Calcium, Ion 1.05 (*)    Hemoglobin 11.6 (*)    HCT 34.0 (*)    All other components within normal limits  CBC  CBG MONITORING, ED    Imaging Review Dg Chest 2 View  01/16/2015  CLINICAL DATA:  34 year old female with shortness of breath, chest discomfort and coughing EXAM: CHEST  2 VIEW COMPARISON:  None. FINDINGS: The lungs are clear and negative for focal airspace consolidation, pulmonary edema or suspicious pulmonary nodule. No pleural effusion or pneumothorax. Cardiac and mediastinal contours are within normal limits. No acute fracture or lytic or blastic osseous lesions. The visualized upper abdominal bowel gas pattern is unremarkable. IMPRESSION: Normal chest x-ray. Electronically Signed   By: Jacqulynn Cadet M.D.   On: 01/16/2015 10:14   I have personally reviewed and evaluated these images and lab results as part of my medical decision-making.   EKG Interpretation   Date/Time:  Sunday January 16 2015 08:18:10 EST Ventricular  Rate:  98 PR Interval:  177 QRS Duration: 80 QT Interval:  325 QTC Calculation: 415 R Axis:   52 Text Interpretation:  Sinus rhythm Abnormal inferior Q waves Borderline T  abnormalities, anterior leads no acute ischemia Confirmed by Thomasene Lot,  [place] ([id number]) on 01/16/2015 8:25:52 AM      MDM   Final diagnoses:  Alcohol intoxication, with unspecified complication ([place])  Hyperglycemia  Patient presents for hyperglycemia and alcohol intoxication. Her glucose is 421 and anion gap is 19. Alcohol level is 195. Patient states she is in no pain now. She feels better after vomiting. EKG is not concerning. No peaked T-wave. Recheck: Repeat CBG is 295. A VBG was done and patient is not acidotic and has a bicarbonate of 17.4. Will repeat i-STAT BMP. Recheck: Patient continues to say that she is not nauseous or in pain  but is now intermittently short of breath. Repeat anionic is 12. CO2 is 19. Glucose is 309. No additional vomiting since her arrival. No pain. Medications  ondansetron (ZOFRAN) injection 4 mg (4 mg Intravenous Given 01/16/15 0554)  sodium chloride 0.9 % bolus 2,000 mL (0 mLs Intravenous Stopped 01/16/15 0714)  sodium chloride 0.9 % bolus 1,000 mL (0 mLs Intravenous Stopped 01/16/15 1136)   Filed Vitals:   01/16/15 1106 01/16/15 1131  BP: 112/65   Pulse: 90 86  Temp: 98.1 F (36.7 C)   Resp: 14 20  Chest x-ray is negative for pneumonia or edema. I discussed results findings with the patient. She feels comfortable going home. I discussed return precautions with the patient. She is tolerating by mouth fluids. Patient agrees with the plan.     Ottie Glazier, PA-C 01/16/15 1137  Everlene Balls, MD 01/16/15 [phone]

## 2015-01-16 NOTE — ED Notes (Signed)
Pt in radiology 

## 2015-01-16 NOTE — Discharge Instructions (Signed)
Intoxicacin alcohlica (Alcohol Intoxication) La intoxicacin alcohlica ocurre cuando ha bebido la cantidad de alcohol suficiente para afectar su desenvolvimiento. Puede ser leve o muy grave. Beber gran cantidad de alcohol en un corto plazo se denomina borrachera. Puede ser [employer]. Beber alcohol tambin puede ser muy peligroso si toma medicamentos o [place] otras drogas. Algunos de los efectos causados por el alcohol son:  Prdida de la coordinacin.  Cambios en el estado de nimo y la conducta.  Pensamiento confuso.  Dificultad para hablar (arrastrar las palabras).  Devolver la comida (vomitar).  Confusin.  Disminucin de Secretary/administrator.  Sacudidas y temblores (convulsiones).  Prdida de la conciencia. CUIDADOS EN EL HOGAR  No conduzca vehculos despus de beber alcohol.  Beba gran cantidad de lquido para mantener el pis (orina) de tono claro o de color amarillo plido. Evite la cafena.  Slo tome los medicamentos que le haya indicado su mdico. SOLICITE AYUDA SI:  Devuelve (vomita) repetidas veces.  No mejora luego de [employer]Sh[employer].  Se intoxica con alcohol con frecuencia. El mdico podr ayudarlo a decidir si debe consultar a un terapeuta especializado en el abuso de sustancias. SOLICITE AYUDA DE INMEDIATO SI:  Siente temblores cuando deja de beber.  Tiene temblores o sacudidas.  Vomita sangre. Puede ser de color rojo brillante o similar a la borra del caf.  Nota sangre en las heces (movimiento intestinal).  Se siente mareado o se desvanece (se desmaya). ASEGRESE DE QUE:   Comprende estas instrucciones.  Controlar su afeccin.  Recibir ayuda de inmediato si no mejora o si empeora.   Esta informacin no tiene Marine scientist el consejo del mdico. Asegrese de hacerle al mdico cualquier pregunta que tenga.   Document Released: 02/10/2010 Document Revised: 09/10/2012 Elsevier Interactive Patient Education 2016 [place] (Hyperglycemia) La hiperglucemia ocurre cuando la glucosa (azcar) en su sangre est demasiado elevada. Puede suceder por varias razones, pero a menudo ocurre en personas que no saben que tienen diabetes o no la controlan adecuadamente.  CAUSAS Tanto si tiene diabetes como si no, existen otras causas para la hiperglucemia. La hiperglucemia puede producirse cuando tiene diabetes, pero tambin puede presentarse en otras situaciones de las que podra no ser consciente, como por ejemplo: Diabetes  Si tiene diabetes y tiene problemas para controlar su glucosa en sangre, la hiperglucemia podra producirse debido a las siguientes razones:  No seguir Armed forces technical officer.  No tomar los medicamentos para la diabetes o tomarlos de forma inadecuada.  Realizar menos ejercicio del que normalmente hace.  Estar enfermo. Prediabetes  Esto no puede ignorarse. Antes de que la persona presente diabetes de tipo 2, casi siempre hay "prediabetes". Esto ocurre cuando su glucosa en sangre es mayor que lo normal, pero no lo suficiente como para diagnosticar diabetes. La investigacin ha demostrado que algunos daos al cuerpo de Barrister's clerk, en especial los del corazn y el sistema circulatorio, podran haber ocurrido durante el periodo de prediabetes. Si controla la glucosa en sangre cuando tiene prediabetes, podr retardar o evitar que se desarrrolle la diabetes tipo 2. El estrs  Si tiene diabetes, deber hacer una dieta, tomar medicamentos orales o insulina para [place]. Sin embargo, Pension scheme manager que la glucosa en sangre es mayor que lo normal en el hospital tenga o no diabetes. Cientficamente se lo denomina "hiperglucemia por estrs". El estrs puede elevar su glucosa en sangre. Esto ocurre porque el organismo genera hormonas en los momentos de estrs. Si el estrs ha Avery Dennison causa  del alto nivel de glucosa en sangre, el mdico podr Optometrist un seguimiento de Justin regular. Ninfa [place], podr asegurarse de que la hiperglucemia no empeora o progresa hacia diabetes. Esteroides  Los esteroides son medicamentos que actan en la infeccin que ataca al sistema inmunolgico para bloquear la inflamacin o la infeccin. Un efecto secundario puede ser el aumento de glucosa en [place]. Muchas personas pueden producir la suficiente insulina extra para este aumento, pero aquellos que no pueden, los esteroides pueden Morgan Stanley niveles sean an [place]. No es inusual que los tratamientos con esteorides "destapen" una diabetes que se est desarrollando. No siempre es posible determinar si la hiperglucemia desaparecer una vez que se detenga el consumo de esteroides. A veces se realiza un anlisis de sangre especial denominado A1c para determinar si la glucosa en sangre se ha elevado antes de comenzar con el consumo de esteroides. SNTOMAS  Sed.  Necesidad frecuente de Garment/textile technologist.  T[employer]oro Corporation.  Visin borrosa.  Cansancio o fatiga.  Debilidad.  Somnolencia.  Hormigueo en el pie o pierna. DIAGNSTICO El diagnstico se realiza mediante el control de la glucosa en sangre de una o varias de las siguientes maneras:  Anlisis A1c. Es una sustancia qumica que se encuentra en la [place].  Control de glucosa en sangre con tiras de prueba.  Resultados de laboratorio. TRATAMIENTO Primero, es importante conocer la causa de la hiperglucemia antes de tratarla. El tratamiento puede ser el siguiente, [place] pueden ser otros:  Educacin  Cambios o ajustes en los medicamentos.  Cambios o ajustes en el plan de alimentacin.  Tratamiento por enfermedades, infecciones, etc.  Control de glucosa en sangre ms frecuente.  Cambios en el plan de ejercicios.  Disminucin o interrupcin del consumo de esteroides.  Cambios en el estilo de vida. INSTRUCCIONES PARA EL CUIDADO DOMICILIARIO  Contrlese la glucosa en sangre, como se lo indicaron.  Haga ejercicios regularmente. El profesional  que lo asiste le dar instrucciones relacionadas con el ejercicio fsico. La prediabetes que es consecuencia de situaciones de estrs, puede mejorar con la actividad fsica.  Consuma alimentos saludables y balanceados. Coma a menudo y de [place] regular, en momentos fijos. El profesional o el nutricionista le dar una dieta especial para controlar su ingestin de azcar.  Mantener su peso ideal es importante. Si lo necesita, perder un poco de peso, como 5  7 Kg. puede ser beneficioso para Unisys Corporation niveles de Museum/gallery exhibitions officer. SOLICITE ATENCIN MDICA SI:  Tiene preguntas relacionadas con los medicamentos, la actividad o la dieta.  Contina teniendo sntomas (como mucha sed, deseos intensos de Garment/textile technologist o aumento de peso) SOLICITE ATENCIN MDICA DE INMEDIATO SI:  Vomita o tiene diarrea.  Su respiracin huele frutal.  La frecuencia respiratoria es ms rpida o ms lenta.  Est somnoliento o incoherente.  Siente adormecimiento, hormigueos o Engineer, agricultural o en las manos.  Siente dolor en el pecho.  Sus sntomas empeoran aunque haya seguido las indicaciones de su mdico.  Tiene otras preguntas o preocupaciones.   Esta informacin no tiene Marine scientist el consejo del mdico. Asegrese de hacerle al mdico cualquier pregunta que tenga.   Document Released: 01/08/2005 Document Revised: 04/02/2011 Elsevier Interactive Patient Education Nation[employer] Mutual Insurance.  Emergency Department Resource Guide 1) Find a Doctor and Pay Out of Pocket Although you won't have to find out who is covered by your insurance plan, it is a good idea to ask around and get recommendations. You will then need to call  the office and see if the doctor you have chosen will accept you as a new patient and what types of options they offer for patients who are self-pay. Some doctors offer discounts or will set up payment plans for their patients who do not have insurance, but you will need to ask so you aren't  surprised when you get to your appointment.  2) Contact Your Local Health Department Not all health departments have doctors that can see patients for sick visits, but many do, so it is worth a call to see if yours does. If you don't know where your local health department is, you can check in your phone book. The CDC also has a tool to help you locate your state's health department, and many state websites also have listings of all of their local health departments.  3) Find a [facility] If your illness is not likely to be very severe or complicated, you may want to try a walk in clinic. These are popping up all over the country in pharmacies, drugstores, and shopping centers. They're usually staffed by nurse practitioners or physician assistants that have been trained to treat common illnesses and complaints. They're usually fairly quick and inexpensive. However, if you have serious medical issues or chronic medical problems, these are probably not your best option.  No Primary Care Doctor: - Call Health Connect at  [phone] - they can help you locate a primary care doctor that  accepts your insurance, provides certain services, etc. - Physician Referral Service- [phone]  Chronic Pain Problems: Organization         Address  Phone   Notes  [facility]  [phone] Patients need to be referred by their primary care doctor.   Medication Assistance: Organization         Address  Phone   Notes  [facility] Medication [facility] [address] [place], [place] [address] [phone] --Must be a resident of [facility] -- Must have NO insurance coverage whatsoever (no Medicaid/ Medicare, etc.) -- The pt. MUST have a primary care doctor that directs their care regularly and follows them in the community   MedAssist  [phone]   [employer]  [phone]    Agencies that provide inexpensive medical care: Organization          Address  Phone   Notes  [facility]  [phone]   [facility]    [phone]   [facility] [place], [place] [address] [phone]   [place] [address] [place] [phone]   Planned Parenthood    [phone]   [facility]    [phone]   [place] and [place] [address], [place] Phone:  [phone], Fax:  [phone] Hours of Operation:  9 am - 6 pm, M-F.  Also accepts Medicaid/Medicare and self-pay.  [facility] [address], [address], [place] Phone: [phone], Fax: [phone]. Hours of Operation:  8:30 am - 5:30 pm, M-F.  Also accepts Medicaid and self-pay.  [facility] [place] [address] [place] Phone: [phone]   [place], [place], [place] [phone], Ext. [phone] Mondays & Thursdays: 7-9 AM.  First 15 patients are seen on a first come, first serve basis.  [place] Providers:  Organization         Address  Phone   Notes  [facility] [address], [place] [phone] Also accepts self-pay patients.  [facility] [id number] [place], [place]  [phone]   [place], [address], [place] [phone]   [facility] [address], [place] [phone]   Lucianne Lei [address], [place]   [phone] Only accepts [place] Access [place] patients after they have their name applied to their card.   Self-Pay (no insurance) in [facility]:  Organization         Address  Phone   Notes  Sickle Cell Patients, [facility] Internal [facility] [place] [phone]   [facility] Care [place] [phone]    [facility] [place]  [place], [place], [place] [phone]   [facility]/Dr. Osei-Bonsu  [address], [place] or [address], [place] [phone] Phone number for both [place] and [place] locations is the same.  Urgent Medical and [facility] [address], [place] [phone]   [facility] [address] [place] or [address] [phone]   [facility] [address] [place] [phone], phone; [phone], fax Sees patients 1st and 3rd Saturday of every month.  Must not qualify for public or private insurance (i.e. Medicaid, Medicare, [place] Health Choice, Veterans' Benefits)  Household income should be no more than 200% of the poverty level The clinic cannot treat you if you are pregnant or think you are pregnant  Sexually transmitted diseases are not treated at the clinic.    Dental Care: Organization         Address  Phone  Notes  [facility] Department of [facility] [phone] Accepts children up to age 48 who are enrolled in [place] or [place]; pregnant women with a Medicaid card; and children who have applied for Medicaid or [place] Health Choice, but were declined, whose parents can pay a reduced fee at time of service.  [place] Department of [facility]  [address], [place] [phone] Accepts children up to age 18 who are enrolled in [place] or Mariano Colon; pregnant women with a Medicaid card; and children who have applied for Medicaid or [place] Health Choice, but were declined, whose parents can pay a reduced fee at time of service.  [place] Adult Dental Access PROGRAM  [place] [phone] Patients are seen by appointment only. Walk-ins are not accepted. [place] will see patients 20  years of age and older. Monday - Tuesday (8am-5pm) Most Wednesdays (8:30-5pm) $30 per visit, cash only  [facility] Adult Dental Access PROGRAM  [address], [facility] [phone] Patients are seen by appointment only. Walk-ins are not accepted. [place] will see patients 36 years of age and older. One Wednesday Evening (Monthly: Volunteer Based).  $30 per visit, cash only  [place]  [phone] for adults; Children under age 49, call Graduate Pediatric Dentistry at [phone]. Children aged 3-14, please call [phone] to request a  pediatric application.  Dental services are provided in all areas of dental care including fillings, crowns and bridges, complete and partial dentures, implants, gum treatment, root canals, and extractions. Preventive care is also provided. Treatment is provided to both adults and children. Patients are selected via a lottery and there is often a waiting list.   [facility] [address], [place]  [phone] [email or web address]   [facility] [address] [place], [place] [phone], Ext. [phone] Second and Fourth Thursday of each month, opens at 6:30 AM; Clinic ends at 9 AM.  Patients are seen on a first-come first-served basis, and a limited number are seen during each clinic.   [facility]  [address] Hillard Danker [place], [place] [phone]   Eligibility Requirements You must have lived in [place], [place], or [place] counties for at least the last three months.   You cannot be eligible for state or federal sponsored Apache Corporation, including [employer], [place], or [employer].   You generally cannot be eligible for healthcare insurance through your employer.    How to apply: Eligibility screenings are held every Tuesday and Wednesday afternoon from 1:00 pm until 4:00 pm. You do not need an appointment for the interview!  [facility]  [address] [place], [place]   [place]  [place] Department  [place]  [phone]    Behavioral Health Resources in the Community: Intensive Outpatient Programs Organization         Address  Phone  Notes  [place]. [address], [place], [place] [phone]   [facility] Outpatient [address], [place], [place]   ADS: Alcohol & Drug Svcs [address], [place], [place]   [place] [address],  [place], [place] or [phone]   Substance Abuse Resources Organization         Address  Phone  Notes  Alcohol and Drug Services  [phone]   [place]  [phone]   The [place]   Chinita Pester  [phone]   Residential & Outpatient Substance Abuse Program  [phone]   Psychological Services Organization         Address  Phone  Notes  [facility]  [place]  [phone]   [place] [address], [place] or [phone]    Mobile Crisis Teams Organization         Address  Phone  Notes  [facility]rapeutic Alternatives, Mobile Crisis Care Unit  [phone]   [facility]ive Psychotherapeutic Services  [address] [place], [place]   [place] Levels [address], [place] [phone]    Self-Help/Support Groups Organization         Address  Phone             Notes  [place]. of [place] - variety of support groups  [place] Call for more information  Narcotics Anonymous (NA), [facility] [address], [employer] [place]  2 meetings at this location   Brewing technologist  Notes  ASAP Residential Treatment [place],    [place]   [phone]   [place]  [place], [place]  [id number], [place], [place]   [place], [place] [phone] Admissions: 8am-3pm M-F  Incentives Substance [place] [address]    [place], [place] [phone]   [facility] [address] [place], [place], [place]   [facility] [address]  [place], [place]   Insight Programs - Intensive Outpatient [address] Kristeen Mans [address], [place], [place]   [facility] [place].,  [place], [place] [phone] or [phone]   [facility]idential Treatment Services (RTS) [address] [place], [place] Accepts Medicaid  [facility] [place] [address],  [place] 1-386-873-5734 Substance Abuse/Addiction Treatment   [facility] Organization         Address  Phone  Notes  [facility]  [phone]   Domenic Schwab, PhD [address] Arlis Porta [place], [place]   [phone] or [phone]   Euharlee Matthews [place], [place] [phone]   [facility] [address], [place], [place] [phone] Insurance/Medicaid/sponsorship through [facility] and Families [address] [place]                                    [place], [place] [phone] [place] [address], [place] [phone]    Dr. Adele Schilder  [phone]   [facility] Dept. 1) [address] [place] 2) [place] 3)  [place] [address], [place] [phone]  [phone]   [place] [phone] or [phone] (After Hours)

## 2015-01-16 NOTE — ED Notes (Signed)
Per EMS , is from home with complaint of hyperglycemia and etoh. Pt.'s cbg is 455mg /dl., pt. Is diabetic. Pt. Had shots  tequila since 9pm last night and reported last drink at 0430 this morning with family. Vomited multiple times as reported. Restless.

## 2015-01-16 NOTE — ED Notes (Signed)
Patient transported to X-ray 

## 2015-01-16 NOTE — ED Notes (Signed)
CBG [id number]

## 2015-01-18 LAB — I-STAT CHEM 8, ED
BUN: <3 mg/dL — ABNORMAL LOW (ref 6–20)
CALCIUM ION: 1.09 mmol/L — AB (ref 1.12–1.23)
CREATININE: 0.4 mg/dL — AB (ref 0.44–1.00)
Chloride: 108 mmol/L (ref 101–111)
GLUCOSE: 352 mg/dL — AB (ref 65–99)
HCT: 64 % — ABNORMAL HIGH (ref 36.0–46.0)
HEMOGLOBIN: 21.8 g/dL — AB (ref 12.0–15.0)
POTASSIUM: 3.8 mmol/L (ref 3.5–5.1)
Sodium: 145 mmol/L (ref 135–145)
TCO2: 17 mmol/L (ref 0–100)

## 2015-08-27 ENCOUNTER — Emergency Department (HOSPITAL_COMMUNITY)
Admission: EM | Admit: 2015-08-27 | Discharge: 2015-08-27 | Disposition: A | Discharge status: Home / Self Care | Payer: Self-pay | Attending: Emergency Medicine | Admitting: Emergency Medicine

## 2015-08-27 ENCOUNTER — Encounter (HOSPITAL_COMMUNITY): Payer: Self-pay | Admitting: Emergency Medicine

## 2015-08-27 DIAGNOSIS — K611 Rectal abscess: Secondary | ICD-10-CM | POA: Insufficient documentation

## 2015-08-27 DIAGNOSIS — E119 Type 2 diabetes mellitus without complications: Secondary | ICD-10-CM | POA: Insufficient documentation

## 2015-08-27 DIAGNOSIS — Z79899 Other long term (current) drug therapy: Secondary | ICD-10-CM | POA: Insufficient documentation

## 2015-08-27 MED ORDER — LIDOCAINE-EPINEPHRINE 2 %-1:100000 IJ SOLN
10.0000 mL | Freq: Once | INTRAMUSCULAR | Status: AC
  Administered 2015-08-27: 10 mL via INTRADERMAL
  Filled 2015-08-27: qty 1

## 2015-08-27 NOTE — ED Notes (Signed)
Bed: WA04 Expected date:  Expected time:  Means of arrival:  Comments: 

## 2015-08-27 NOTE — ED Triage Notes (Signed)
Patient presents for abscess to right inner buttock x3 days. Denies fever, N/V. No drainage. Hardened area with redness noted to same.

## 2015-08-27 NOTE — ED Provider Notes (Signed)
[facility] DEPT Provider Note   CSN: [id number] Arrival date & time: 08/27/15  1940  First Provider Contact:  None       History   Chief Complaint Chief Complaint  Patient presents with  . Abscess    HPI Audrey Mcmillan is a 35 y.o. female.  The history is provided by the patient.  Abscess  Location:  Pelvis Pelvic abscess location:  Anus Size:  2cm Abscess quality: induration, painful, redness and warmth   Duration:  3 days Progression:  Worsening Pain details:    Quality:  Aching and sharp   Severity:  Moderate   Timing:  Constant   Progression:  Worsening Chronicity:  New Context: not diabetes and not immunosuppression   Relieved by:  Nothing Worsened by:  Nothing Ineffective treatments:  None tried Associated symptoms: no fever and no vomiting   Risk factors: no prior abscess     Past Medical History:  Diagnosis Date  . Diabetes mellitus without complication ([place])   . High cholesterol     There are no active problems to display for this patient.   History reviewed. No pertinent surgical history.  OB History    No data available       Home Medications    Prior to Admission medications   Medication Sig Start Date End Date Taking? Authorizing Provider  levothyroxine (SYNTHROID, LEVOTHROID) 150 MCG tablet Take 150 mcg by mouth daily. 12/20/14   Historical Provider, MD  pioglitazone (ACTOS) 15 MG tablet Take 15 mg by mouth daily.    Historical Provider, MD  TOUJEO SOLOSTAR 300 UNIT/ML SOPN Inject 20 Units into the skin at bedtime. 11/02/14   Historical Provider, MD    Family History No family history on file.  Social History Social History  Substance Use Topics  . Smoking status: Never Smoker  . Smokeless tobacco: Never Used  . Alcohol use Yes     Comment: occ     Allergies   Review of patient's allergies indicates no known allergies.   Review of Systems Review of Systems  Constitutional: Negative for fever.    Gastrointestinal: Negative for vomiting.  All other systems reviewed and are negative.    Physical Exam Updated Vital Signs BP 149/77 (BP Location: Left Arm)   Pulse 108   Temp 98.6 F (37 C) (Oral)   Resp 20   Ht 5\' 2"  (1.575 m)   Wt 156 lb (70.8 kg)   LMP 08/13/2015   SpO2 96%   BMI 28.53 kg/m   Physical Exam  Constitutional: She is oriented to person, place, and time. She appears well-developed and well-nourished. No distress.  HENT:  Head: Normocephalic.  Eyes: Conjunctivae are normal.  Neck: Neck supple. No tracheal deviation present.  Cardiovascular: Normal rate and regular rhythm.   Pulmonary/Chest: Effort normal. No respiratory distress.  Abdominal: Soft. She exhibits no distension.  Genitourinary: Rectal exam shows tenderness. Rectal exam shows no external hemorrhoid and no internal hemorrhoid.  Genitourinary Comments: 2cm area of induration and fluctuance at right side of rectum without palpable extension internally  Neurological: She is alert and oriented to person, place, and time.  Skin: Skin is warm and dry.  Psychiatric: She has a normal mood and affect.     ED Treatments / Results  Labs (all labs ordered are listed, but only abnormal results are displayed) Labs Reviewed - No data to display  EKG  EKG Interpretation None       Radiology No results found.  Procedures  Procedures (including critical care time)  INCISION AND DRAINAGE Performed by: Leo Grosser Consent: Verbal consent obtained. Risks and benefits: risks, benefits and alternatives were discussed Type: abscess  Body area: perirectal  Anesthesia: local infiltration  Incision was made with a scalpel.  Local anesthetic: lidocaine 2% w epinephrine  Anesthetic total: 8 ml  Complexity: complex Blunt dissection to break up loculations  Drainage: purulent  Drainage amount: moderate  Packing material: none  Patient tolerance: Patient tolerated the procedure well with no  immediate complications.     Medications Ordered in ED Medications - No data to display   Initial Impression / Assessment and Plan / ED Course  I have reviewed the triage vital signs and the nursing notes.  Pertinent labs & imaging results that were available during my care of the patient were reviewed by me and considered in my medical decision making (see chart for details).  Clinical Course    Patient presents with abscess. Located over perirectal area. No extension internally on rectal exam. Drained as above with good relief of pressure after local anesthesia. No overlying cellulitis appreciated on exam and no indication for antibiotics at this time. Wound left open to drain and return precautions discussed for wound recheck.    Final Clinical Impressions(s) / ED Diagnoses   Final diagnoses:  Perirectal abscess    New Prescriptions New Prescriptions   No medications on file     Leo Grosser, MD 08/29/15 [phone]

## 2017-02-27 ENCOUNTER — Ambulatory Visit (INDEPENDENT_AMBULATORY_CARE_PROVIDER_SITE_OTHER): Payer: Self-pay | Admitting: Physician Assistant

## 2017-02-27 ENCOUNTER — Encounter (INDEPENDENT_AMBULATORY_CARE_PROVIDER_SITE_OTHER): Payer: Self-pay | Admitting: Physician Assistant

## 2017-02-27 VITALS — BP 106/68 | HR 71 | Temp 97.9°F | Resp 18 | Ht 63.0 in | Wt 153.0 lb

## 2017-02-27 DIAGNOSIS — E1142 Type 2 diabetes mellitus with diabetic polyneuropathy: Secondary | ICD-10-CM

## 2017-02-27 DIAGNOSIS — F418 Other specified anxiety disorders: Secondary | ICD-10-CM

## 2017-02-27 DIAGNOSIS — Z794 Long term (current) use of insulin: Secondary | ICD-10-CM

## 2017-02-27 DIAGNOSIS — Z131 Encounter for screening for diabetes mellitus: Secondary | ICD-10-CM

## 2017-02-27 DIAGNOSIS — E039 Hypothyroidism, unspecified: Secondary | ICD-10-CM

## 2017-02-27 DIAGNOSIS — R5383 Other fatigue: Secondary | ICD-10-CM

## 2017-02-27 DIAGNOSIS — Z23 Encounter for immunization: Secondary | ICD-10-CM

## 2017-02-27 DIAGNOSIS — E7841 Elevated Lipoprotein(a): Secondary | ICD-10-CM

## 2017-02-27 LAB — POCT GLYCOSYLATED HEMOGLOBIN (HGB A1C): HEMOGLOBIN A1C: 11.8

## 2017-02-27 MED ORDER — ATORVASTATIN CALCIUM 40 MG PO TABS
40.0000 mg | ORAL_TABLET | Freq: Every day | ORAL | 5 refills | Status: DC
Start: 2017-02-27 — End: 2017-03-27

## 2017-02-27 MED ORDER — INSULIN GLARGINE 100 UNIT/ML ~~LOC~~ SOLN: 45.0000 [IU] | Freq: Every day | SUBCUTANEOUS | 5 refills | Status: DC

## 2017-02-27 MED ORDER — INSULIN SYRINGES (DISPOSABLE) U-100 1 ML MISC: 1.0000 "application " | Freq: Four times a day (QID) | 5 refills | Status: DC

## 2017-02-27 MED ORDER — LEVOTHYROXINE SODIUM 150 MCG PO TABS: 150.0000 ug | ORAL_TABLET | Freq: Every day | ORAL | 5 refills | Status: DC

## 2017-02-27 MED ORDER — INSULIN LISPRO 100 UNIT/ML ~~LOC~~ SOLN: SUBCUTANEOUS | 11 refills | Status: DC

## 2017-02-27 MED ORDER — METFORMIN HCL 1000 MG PO TABS: 1000.0000 mg | ORAL_TABLET | Freq: Two times a day (BID) | ORAL | 5 refills | Status: DC

## 2017-02-27 MED ORDER — ESCITALOPRAM OXALATE 10 MG PO TABS: 10.0000 mg | ORAL_TABLET | Freq: Every day | ORAL | 5 refills | Status: DC

## 2017-02-27 MED FILL — ?LEVOTHYROXINE 150 MCG TAB: 150 | 30 days supply | Qty: 30 | Fill #0

## 2017-02-27 MED FILL — ESCITALOPRAM 10 MG TABLET: 10 | 30 days supply | Qty: 30 | Fill #0

## 2017-02-27 MED FILL — !LANTUS 100 UNITS/ML VIAL: 100 | 22 days supply | Qty: 10 | Fill #0

## 2017-02-27 MED FILL — ?METFORMIN HCL 1,000 MG TAB: 1000 | 30 days supply | Qty: 60 | Fill #0

## 2017-02-27 MED FILL — ?ATORVASTATIN 40MG TAB: 40 | 30 days supply | Qty: 30 | Fill #0

## 2017-02-27 NOTE — Patient Instructions (Signed)
Diabetes mellitus y nutrición  Diabetes Mellitus and Nutrition  Si sufre de diabetes (diabetes mellitus), es muy importante tener hábitos alimenticios saludables debido a que sus niveles de azúcar en la sangre (glucosa) se ven afectados en gran medida por lo que come y bebe. Comer alimentos saludables en las cantidades adecuadas, aproximadamente a la misma hora todos los días, lo ayudará a:  · Controlar la glucemia.  · Disminuir el riesgo de sufrir una enfermedad cardíaca.  · Mejorar la presión arterial.  · Alcanzar o mantener un peso saludable.    Todas las personas que sufren de diabetes son diferentes y cada una tiene necesidades diferentes en cuanto a un plan de alimentación. El médico puede recomendarle que trabaje con un especialista en dietas y nutrición (nutricionista) para elaborar el mejor plan para usted. Su plan de alimentación puede variar según factores como:  · Las calorías que necesita.  · Los medicamentos que toma.  · Su peso.  · Sus niveles de glucemia, presión arterial y colesterol.  · Su nivel de actividad.  · Otras afecciones que tenga, como enfermedades cardíacas o renales.    ¿Cómo me afectan los carbohidratos?  Los carbohidratos afectan el nivel de glucemia más que cualquier otro tipo de alimento. La ingesta de carbohidratos naturalmente aumenta la cantidad glucosa en la sangre. El recuento de carbohidratos es un método destinado a llevar un registro de la cantidad de carbohidratos que se ingieren. El recuento de carbohidratos es importante para mantener la glucemia a un nivel saludable, en especial si utiliza insulina o toma determinados medicamentos por vía oral para la diabetes.  Es importante saber la cantidad de carbohidratos que se pueden ingerir en cada comida sin correr ningún riesgo. Esto es diferente en cada persona. El nutricionista puede ayudarlo a calcular la cantidad de carbohidratos que debe ingerir en cada comida y colación.   Los alimentos que contienen carbohidratos incluyen:  · Pan, cereal, arroz, pasta y galletas.  · Papas y maíz.  · Guisantes, frijoles y lentejas.  · Leche y yogur.  · Frutas y jugo.  · Postres, como pasteles, galletitas, helado y caramelos.    ¿Cómo me afecta el alcohol?  El alcohol puede provocar disminuciones súbitas de la glucemia (hipoglucemia), en especial si utiliza insulina o toma determinados medicamentos por vía oral para la diabetes. La hipoglucemia es una afección potencialmente mortal. Los síntomas de la hipoglucemia (somnolencia, mareos y confusión) son similares a los síntomas de haber consumido demasiado alcohol.  Si el médico afirma que el alcohol es seguro para usted, siga estas pautas:  · Limite el consumo de alcohol a no más de 1 medida por día si es mujer y no está embarazada, y a 2 medidas si es hombre. Una medida equivale a 12 oz (355 ml) de cerveza, 5 oz (148 ml) de vino o 1½ oz (44 ml) de bebidas de alta graduación alcohólica.  · No beba con el estómago vacío.  · Manténgase hidratado con agua, gaseosas dietéticas o té helado sin azúcar.  · Tenga en cuenta que las gaseosas comunes, los jugos y otros refrescos pueden contener mucha azúcar y se deben contar como carbohidratos.    Consejos para seguir este plan  Leer las etiquetas de los alimentos  · Comience por controlar el tamaño de la porción en la etiqueta. La cantidad de calorías, carbohidratos, grasas y otros nutrientes mencionados en la etiqueta se basan en una porción del alimento. Muchos alimentos contienen más de una porción por envase.  · Verifique la cantidad total de gramos (g)   de carbohidratos totales en una porción. Puede calcular la cantidad de porciones de carbohidratos al dividir el total de carbohidratos por 15. Por ejemplo, si un alimento posee un total de 30 g de carbohidratos, equivale a 2 porciones de carbohidratos.  · Verifique la cantidad de gramos (g) de grasas saturadas y grasas trans  en una porción. Escoja alimentos que no contengan grasa o que tengan un bajo contenido.  · Controle la cantidad de miligramos (mg) de sodio en una porción. La mayoría de las personas deben limitar la ingesta de sodio total a menos de 2300 mg por día.  · Siempre consulte la información nutricional de los alimentos etiquetados como “con bajo contenido de grasa” o “sin grasa”. Estos alimentos pueden ser más altos en azúcar agregada o en carbohidratos refinados y deben evitarse.  · Hable con el nutricionista para identificar sus objetivos diarios en cuanto a los nutrientes mencionados en la etiqueta.  De compras  · Evite comprar alimentos procesados, enlatados o prehechos. Estos alimentos tienden a tener mayor cantidad de grasa, sodio y azúcar agregada.  · Compre en la zona exterior de la tienda de comestibles. Esta incluye frutas y vegetales frescos, granos a granel, carnes frescas y productos lácteos frescos.  Cocción  · Utilice métodos de cocción a baja temperatura, como hornear, en lugar de métodos de cocción a alta temperatura, como freír en abundante aceite.  · Cocine con aceites saludables, como el aceite de oliva, canola o girasol.  · Evite cocinar con manteca, crema o carnes con alto contenido de grasa.  Planificación de las comidas  · Consuma las comidas y las colaciones de forma regular, preferentemente a la misma hora todos los días. Evite pasar largos períodos de tiempo sin comer.  · Consuma alimentos ricos en fibra, como frutas frescas, verduras, frijoles y cereales integrales. Consulte al nutricionista sobre cuántas porciones de carbohidratos puede consumir en cada comida.  · Consuma entre 4 y 6 onzas de proteínas magras por día, como carnes magras, pollo, pescado, huevos o tofu. 1 onza equivale a 1 onza de carne, pollo o pescado, 1 huevo, o 1/4 taza de tofu.  · Coma algunos alimentos por día que contengan grasas saludables, como aguacates, frutos secos, semillas y pescado.  Estilo de vida     · Controle su nivel de glucemia con regularidad.  · Haga ejercicio al menos 30 minutos, 5 días o más por semana, o como se lo haya indicado el médico.  · Tome los medicamentos como se lo haya indicado el médico.  · No consuma ningún producto que contenga nicotina o tabaco, como cigarrillos y cigarrillos electrónicos. Si necesita ayuda para dejar de fumar, consulte al médico.  · Trabaje con un asesor o instructor en diabetes para identificar estrategias para controlar el estrés y cualquier desafío emocional y social.  ¿Cuáles son algunas de las preguntas que puedo hacerle a mi médico?  · ¿Es necesario que me reúna con un instructor en diabetes?  · ¿Es necesario que me reúna con un nutricionista?  · ¿A qué número puedo llamar si tengo preguntas?  · ¿Cuáles son los mejores momentos para controlar la glucemia?  Dónde encontrar más información:  · Asociación Americana de la Diabetes (American Diabetes Association): diabetes.org/food-and-fitness/food  · Academia de Nutrición y Dietética (Academy of Nutrition and Dietetics): www.eatright.org/resources/health/diseases-and-conditions/diabetes  · Instituto Nacional de la Diabetes y las Enfermedades Digestivas y Renales (National Institute of Diabetes and Digestive and Kidney Diseases) (Institutos Nacionales de Salud, NIH): www.niddk.nih.gov/health-information/diabetes/overview/diet-eating-physical-activity  Resumen  · Un plan de alimentación saludable   lo ayudará a controlar la glucemia y mantener un estilo de vida saludable.  · Trabajar con un especialista en dietas y nutrición (nutricionista) puede ayudarlo a elaborar el mejor plan de alimentación para usted.  · Tenga en cuenta que los carbohidratos y el alcohol tienen efectos inmediatos en sus niveles de glucemia. Es importante contar los carbohidratos y consumir alcohol con prudencia.  Esta información no tiene como fin reemplazar el consejo del médico. Asegúrese de hacerle al médico cualquier pregunta que tenga.   Document Released: 04/17/2007 Document Revised: 04/30/2016 Document Reviewed: 04/30/2016  Elsevier Interactive Patient Education © 2018 Elsevier Inc.
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
## 2017-02-28 LAB — LIPID PANEL
CHOLESTEROL TOTAL: 253 mg/dL — AB (ref 100–199)
Chol/HDL Ratio: 6.3 ratio — ABNORMAL HIGH (ref 0.0–4.4)
HDL: 40 mg/dL (ref >39)
LDL Calculated: 159 mg/dL — ABNORMAL HIGH (ref 0–99)
Triglycerides: 271 mg/dL — ABNORMAL HIGH (ref 0–149)
VLDL Cholesterol Cal: 54 mg/dL — ABNORMAL HIGH (ref 5–40)

## 2017-02-28 LAB — COMPREHENSIVE METABOLIC PANEL
A/G RATIO: 1.5 (ref 1.2–2.2)
ALK PHOS: 92 IU/L (ref 39–117)
ALT: 15 IU/L (ref 0–32)
AST: 10 IU/L (ref 0–40)
Albumin: 4.5 g/dL (ref 3.5–5.5)
BUN/Creatinine Ratio: 13 (ref 9–23)
BUN: 9 mg/dL (ref 6–20)
Bilirubin Total: 0.7 mg/dL (ref 0.0–1.2)
CHLORIDE: 101 mmol/L (ref 96–106)
CO2: 22 mmol/L (ref 20–29)
Calcium: 10.1 mg/dL (ref 8.7–10.2)
Creatinine, Ser: 0.69 mg/dL (ref 0.57–1.00)
GFR calc Af Amer: 130 mL/min/{1.73_m2} (ref >59)
GFR calc non Af Amer: 113 mL/min/{1.73_m2} (ref >59)
GLOBULIN, TOTAL: 3 g/dL (ref 1.5–4.5)
Glucose: 300 mg/dL — ABNORMAL HIGH (ref 65–99)
POTASSIUM: 4.9 mmol/L (ref 3.5–5.2)
SODIUM: 138 mmol/L (ref 134–144)
Total Protein: 7.5 g/dL (ref 6.0–8.5)

## 2017-02-28 LAB — CBC WITH DIFFERENTIAL/PLATELET
Basophils Absolute: 0 10*3/uL (ref 0.0–0.2)
Basos: 0 %
EOS (ABSOLUTE): 0.1 10*3/uL (ref 0.0–0.4)
EOS: 2 %
HEMATOCRIT: 44.3 % (ref 34.0–46.6)
Hemoglobin: 14.7 g/dL (ref 11.1–15.9)
IMMATURE GRANULOCYTES: 0 %
Immature Grans (Abs): 0 10*3/uL (ref 0.0–0.1)
LYMPHS: 43 %
Lymphocytes Absolute: 3.1 10*3/uL (ref 0.7–3.1)
MCH: 30.2 pg (ref 26.6–33.0)
MCHC: 33.2 g/dL (ref 31.5–35.7)
MCV: 91 fL (ref 79–97)
MONOS ABS: 0.4 10*3/uL (ref 0.1–0.9)
Monocytes: 5 %
NEUTROS PCT: 50 %
Neutrophils Absolute: 3.6 10*3/uL (ref 1.4–7.0)
PLATELETS: 301 10*3/uL (ref 150–379)
RBC: 4.86 x10E6/uL (ref 3.77–5.28)
RDW: 13.1 % (ref 12.3–15.4)
WBC: 7.2 10*3/uL (ref 3.4–10.8)

## 2017-02-28 LAB — THYROID PANEL WITH TSH
Free Thyroxine Index: 1.1 — ABNORMAL LOW (ref 1.2–4.9)
T3 UPTAKE RATIO: 20 % — AB (ref 24–39)
T4 TOTAL: 5.4 ug/dL (ref 4.5–12.0)
TSH: 16.12 u[IU]/mL — ABNORMAL HIGH (ref 0.450–4.500)

## 2017-03-01 ENCOUNTER — Other Ambulatory Visit (INDEPENDENT_AMBULATORY_CARE_PROVIDER_SITE_OTHER): Payer: Self-pay | Admitting: Physician Assistant

## 2017-03-04 ENCOUNTER — Encounter (INDEPENDENT_AMBULATORY_CARE_PROVIDER_SITE_OTHER): Payer: Self-pay | Admitting: Physician Assistant

## 2017-03-07 ENCOUNTER — Telehealth (INDEPENDENT_AMBULATORY_CARE_PROVIDER_SITE_OTHER): Payer: Self-pay | Admitting: *Deleted

## 2017-03-07 NOTE — Telephone Encounter (Signed)
Medical Assistant used [employer] to contact patient.  Interpreter Name:Diego Interpreter #: [phone] Medical Assistant left message on patient's home and cell voicemail. Voicemail states to give a call back to [place] with [facility] at [phone]. !!!Please inform patient of thyroid being abnormal and cholesterol being elevate likely due to being out of medication. Patient has refills and should take as directed. Patient will have a recheck completed on 03/27/17 for PAP!!

## 2017-03-07 NOTE — Telephone Encounter (Signed)
-----   Message from Clent Demark, PA-C sent at 03/01/2017 12:11 PM EST ----- Thyroid abnormal and cholesterol elevated but this is likely due to having run out of medications. I have already refilled thyroid medication. Please take as directed and after eight weeks of taking medication. She may keep appt on 03/27/17 for her PAP.

## 2017-03-27 ENCOUNTER — Encounter (INDEPENDENT_AMBULATORY_CARE_PROVIDER_SITE_OTHER): Payer: Self-pay | Admitting: Physician Assistant

## 2017-03-27 ENCOUNTER — Other Ambulatory Visit: Payer: Self-pay

## 2017-03-27 ENCOUNTER — Telehealth (INDEPENDENT_AMBULATORY_CARE_PROVIDER_SITE_OTHER): Payer: Self-pay | Admitting: Physician Assistant

## 2017-03-27 ENCOUNTER — Ambulatory Visit (INDEPENDENT_AMBULATORY_CARE_PROVIDER_SITE_OTHER): Payer: Self-pay | Admitting: Physician Assistant

## 2017-03-27 ENCOUNTER — Other Ambulatory Visit (HOSPITAL_COMMUNITY)
Admission: RE | Admit: 2017-03-27 | Discharge: 2017-03-27 | Disposition: A | Discharge status: Home / Self Care | Payer: Self-pay | Source: Ambulatory Visit | Attending: Physician Assistant | Admitting: Physician Assistant

## 2017-03-27 VITALS — BP 121/58 | HR 74 | Temp 98.3°F | Wt 156.4 lb

## 2017-03-27 DIAGNOSIS — Z124 Encounter for screening for malignant neoplasm of cervix: Secondary | ICD-10-CM | POA: Insufficient documentation

## 2017-03-27 DIAGNOSIS — E7841 Elevated Lipoprotein(a): Secondary | ICD-10-CM | POA: Insufficient documentation

## 2017-03-27 DIAGNOSIS — Z794 Long term (current) use of insulin: Secondary | ICD-10-CM

## 2017-03-27 DIAGNOSIS — Z114 Encounter for screening for human immunodeficiency virus [HIV]: Secondary | ICD-10-CM | POA: Insufficient documentation

## 2017-03-27 DIAGNOSIS — E039 Hypothyroidism, unspecified: Secondary | ICD-10-CM | POA: Insufficient documentation

## 2017-03-27 DIAGNOSIS — E119 Type 2 diabetes mellitus without complications: Secondary | ICD-10-CM

## 2017-03-27 MED ORDER — ATORVASTATIN CALCIUM 40 MG PO TABS
80.0000 mg | ORAL_TABLET | Freq: Every day | ORAL | 11 refills | Status: DC
Start: 2017-03-27 — End: 2017-07-01

## 2017-03-27 MED FILL — ?LEVOTHYROXINE 150 MCG TAB: 150 | 30 days supply | Qty: 30 | Fill #1

## 2017-03-27 MED FILL — ATORVASTATIN 40 MG TABLET: 40 | 30 days supply | Qty: 60 | Fill #0

## 2017-03-27 NOTE — Progress Notes (Signed)
Subjective:  Patient ID: Audrey Mcmillan, female    DOB: 12/29/1980  Age: 37 y.o. MRN: [id number]  CC: PAP  HPI Audrey Mcmillan is a 37 y.o. female with a medical history of DM2 and hypothyroidism presents to report glucometer readings and to have a PAP collected. Says her new insulin regimen has been successful and her glucometer low 120, high 375, and regular 200. Has been using Lantus sliding scale and Humalog qhs.     Ready to have pap done. Endorses occasional left sided adnexal pain. Does not endorse any other GU symptoms.     Outpatient Medications Prior to Visit  Medication Sig Dispense Refill  . atorvastatin (LIPITOR) 40 MG tablet Take 1 tablet (40 mg total) by mouth daily. 30 tablet 5  . escitalopram (LEXAPRO) 10 MG tablet Take 1 tablet (10 mg total) by mouth daily. 30 tablet 5  . insulin glargine (LANTUS) 100 UNIT/ML injection Inject 0.45 mLs (45 Units total) into the skin at bedtime. 10 mL 5  . insulin lispro (HUMALOG) 100 UNIT/ML injection Use per sliding scale: If sugar 150-200 take 2 units If sugar 201-251 take 4 units If sugar 251-300 take 6 units If sugar 301-350 take 8 units If sugar 351-400 take 10 units 10 mL 11  . Insulin Syringes, Disposable, U-100 1 ML MISC Inject 1 application into the skin 4 (four) times daily. 120 each 5  . levothyroxine (SYNTHROID, LEVOTHROID) 150 MCG tablet Take 150 mcg by mouth daily.  0  . levothyroxine (SYNTHROID, LEVOTHROID) 150 MCG tablet Take 1 tablet (150 mcg total) by mouth daily before breakfast. 30 tablet 5  . metFORMIN (GLUCOPHAGE) 1000 MG tablet Take 1 tablet (1,000 mg total) by mouth 2 (two) times daily with a meal. 60 tablet 5  . pioglitazone (ACTOS) 15 MG tablet Take 15 mg by mouth daily.    Nelva Nay SOLOSTAR 300 UNIT/ML SOPN Inject 20 Units into the skin at bedtime.  0   No facility-administered medications prior to visit.      ROS Review of Systems  Constitutional: Negative for chills, fever and malaise/fatigue.   Eyes: Negative for blurred vision.  Respiratory: Negative for shortness of breath.   Cardiovascular: Negative for chest pain and palpitations.  Gastrointestinal: Negative for abdominal pain and nausea.  Genitourinary: Negative for dysuria and hematuria.  Musculoskeletal: Negative for joint pain and myalgias.  Skin: Negative for rash.  Neurological: Negative for tingling and headaches.  Psychiatric/Behavioral: Negative for depression. The patient is not nervous/anxious.     Objective:  BP (!) 121/58 (BP Location: Left Arm, Patient Position: Sitting, Cuff Size: Normal)   Pulse 74   Temp 98.3 F (36.8 C) (Oral)   Wt 156 lb 6.4 oz (70.9 kg)   LMP 03/20/2017 (Exact Date)   SpO2 97%   BMI 27.71 kg/m   BP/Weight 03/27/2017 08/26/6312 09/28/261  Systolic BP 785 885 027  Diastolic BP 58 68 69  Wt. (Lbs) 156.4 153 156  BMI 27.71 27.1 28.53      Physical Exam  Constitutional: She is oriented to person, place, and time.  Well developed, well nourished, NAD, polite  HENT:  Head: Normocephalic and atraumatic.  Eyes: No scleral icterus.  Neck: Normal range of motion.  Cardiovascular: Normal rate, regular rhythm and normal heart sounds.  Pulmonary/Chest: Effort normal and breath sounds normal.  Genitourinary:  Genitourinary Comments: No vaginal discharge. Cervix somewhat friable, no motion tenderness to palpation. No adnexal mass or tenderness bilaterally. Uterus without mass or tenderness.  Musculoskeletal: She exhibits no edema.  Neurological: She is alert and oriented to person, place, and time.  Skin: Skin is warm and dry. No rash noted. No erythema. No pallor.  Psychiatric: She has a normal mood and affect. Her behavior is normal. Thought content normal.  Vitals reviewed.    Assessment & Plan:   1. Elevated lipoprotein(a) - atorvastatin (LIPITOR) 40 MG tablet; Take 2 tablets (80 mg total) by mouth daily.  Dispense: 60 tablet; Refill: 11  2. Screening for HIV (human  immunodeficiency virus) - HIV antibody; Future  3. Acquired hypothyroidism - Thyroid Panel With TSH; Future  4. Screening for cervical cancer - Cytology - PAP([place])  5. Type 2 diabetes mellitus without complication with long term current use of insulin - I have explained once again on how to properly use the both different kinds of insulin. Pt communicated understanding.     Meds ordered this encounter  Medications  . atorvastatin (LIPITOR) 40 MG tablet    Sig: Take 2 tablets (80 mg total) by mouth daily.    Dispense:  60 tablet    Refill:  11    Order Specific Question:   Supervising Provider    Answer:   Tresa Garter [[id number]]    Follow-up: Return in about 3 months (around 06/27/2017) for Diabetes and HLD.   Clent Demark PA

## 2017-03-27 NOTE — Telephone Encounter (Signed)
FWD to PCP. Tempestt S Roberts, CMA  

## 2017-03-27 NOTE — Patient Instructions (Signed)
Prueba de Papanicolaou  (Pap Test)  ¿POR QUÉ ME DEBO REALIZAR ESTA PRUEBA?  A esta prueba también se la denomina "frotis de Pap". Es una prueba de detección que se utiliza para detectar signos de cáncer de vagina, cuello del útero y útero. La prueba también puede identificar la presencia de infección o cambios precancerosos. El médico probablemente le recomiende que se realice esta prueba en forma regular. Esta prueba puede realizarse de la siguiente manera:  · Cada 3 años, a partir de los 21 años.  · Cada 5 años, en combinación con las pruebas que se realizan para detectar la presencia del virus del papiloma humano (VPH).  · Con mayor o menor frecuencia, en función de otras enfermedades que tenga.  ¿QUÉ TIPO DE MUESTRA SE TOMA?  El médico utilizará un pequeño hisopo de algodón, una espátula de plástico o un cepillo para recolectar una muestra de células de la superficie del cuello del útero. El cuello del útero es la apertura del útero, que también se conoce como matriz. También pueden recolectarse las secreciones del cuello del útero y la vagina.  ¿CÓMO DEBO PREPARARME PARA ESTA PRUEBA?  · Tenga en cuenta en qué etapa del ciclo menstrual se encuentra. Es posible que deba reprogramar la prueba si está menstruando el día en que debe realizársela.  · Si el día en que debe realizarse la prueba tiene una infección vaginal aparente, deberá reprogramar la prueba.  · Pueden pedirle que evite tomar una ducha o baño el día de la prueba o el día anterior.  · Algunos medicamentos pueden provocar resultados anormales de la prueba, como los digitálicos y la tetraciclina. Si toma alguno de estos medicamentos, hable con su médico antes de realizarse la prueba.  ¿QUÉ SIGNIFICAN LOS RESULTADOS?  Los resultados anormales de la prueba pueden indicar diversas enfermedades. Estas pueden incluir lo siguiente:  · Cáncer. Si bien los resultados de la prueba de Papanicolaou no pueden  utilizarse para diagnosticar cáncer de cuello del útero, de vagina o de útero, pueden indicar que existe una posibilidad de presencia de cáncer. En este caso, será necesario realizar pruebas adicionales para determinar la presencia de cáncer.  · Enfermedad de transmisión sexual.  · Infecciones por hongos.  · Infección por parásitos.  · Infección por herpes.  · Una enfermedad que causa o favorece la infertilidad.  Es su responsabilidad retirar el resultado del estudio. Consulte en el laboratorio o en el departamento en el que fue realizado el estudio cuándo y cómo podrá obtener los resultados. Comuníquese con el médico si tiene preguntas sobre los resultados.  Esta información no tiene como fin reemplazar el consejo del médico. Asegúrese de hacerle al médico cualquier pregunta que tenga.  Document Released: 06/27/2007 Document Revised: 01/29/2014 Document Reviewed: 06/01/2013  Elsevier Interactive Patient Education © 2018 Elsevier Inc.

## 2017-03-27 NOTE — Telephone Encounter (Signed)
Patient went ro [facility] to pick up her Humalog and they told her that she wasn't authorize only for the Lantus

## 2017-03-28 ENCOUNTER — Other Ambulatory Visit (INDEPENDENT_AMBULATORY_CARE_PROVIDER_SITE_OTHER): Payer: Self-pay | Admitting: Physician Assistant

## 2017-03-28 DIAGNOSIS — N760 Acute vaginitis: Principal | ICD-10-CM

## 2017-03-28 DIAGNOSIS — B9689 Other specified bacterial agents as the cause of diseases classified elsewhere: Secondary | ICD-10-CM

## 2017-03-28 LAB — CYTOLOGY - PAP
BACTERIAL VAGINITIS: POSITIVE — AB
Candida vaginitis: NEGATIVE
Chlamydia: NEGATIVE
DIAGNOSIS: NEGATIVE
NEISSERIA GONORRHEA: NEGATIVE
Trichomonas: NEGATIVE

## 2017-03-28 MED ORDER — METRONIDAZOLE 500 MG PO TABS: 500.0000 mg | ORAL_TABLET | Freq: Two times a day (BID) | ORAL | 0 refills | Status: AC

## 2017-03-29 ENCOUNTER — Other Ambulatory Visit (INDEPENDENT_AMBULATORY_CARE_PROVIDER_SITE_OTHER): Payer: Self-pay | Admitting: Physician Assistant

## 2017-03-29 ENCOUNTER — Telehealth (INDEPENDENT_AMBULATORY_CARE_PROVIDER_SITE_OTHER): Payer: Self-pay

## 2017-03-29 DIAGNOSIS — E1142 Type 2 diabetes mellitus with diabetic polyneuropathy: Secondary | ICD-10-CM

## 2017-03-29 DIAGNOSIS — Z794 Long term (current) use of insulin: Principal | ICD-10-CM

## 2017-03-29 MED ORDER — INSULIN LISPRO 100 UNIT/ML ~~LOC~~ SOLN: SUBCUTANEOUS | 11 refills | Status: DC

## 2017-03-29 MED FILL — metroNIDAZOLE 500 MG TABS: 500 | 7 days supply | Qty: 14 | Fill #0

## 2017-03-29 NOTE — Telephone Encounter (Signed)
I have spoken to the pharmacy. I reprinted the rx. Have patient pick up and take to [place].

## 2017-03-29 NOTE — Telephone Encounter (Signed)
Noted   She will come to pick up the RX today  Thank you

## 2017-03-29 NOTE — Telephone Encounter (Signed)
-----   Message from Clent Demark, PA-C sent at 03/28/2017  5:47 PM EST ----- Pap neg for cancer. Positive for bacterial vaginitis. I will send metronidazole to [facility].

## 2017-03-29 NOTE — Telephone Encounter (Signed)
Using interpreter [phone]) left patient a message informing that pap negative for cancer but positive for BV and that medication has been sent to pharmacy. Nat Christen, CMA

## 2017-04-02 MED FILL — ?HUMALOG 100 UNITS/ML VIAL: 100 | 28 days supply | Qty: 10 | Fill #0

## 2017-04-22 MED FILL — ?LEVOTHYROXINE 150 MCG TAB: 150 | 30 days supply | Qty: 30 | Fill #2

## 2017-04-22 MED FILL — metFORMIN HCL 1000 MG TABS: 1000 | 30 days supply | Qty: 60 | Fill #1

## 2017-04-22 MED FILL — !LANTUS 100 UNITS/ML VIAL: 100 | 22 days supply | Qty: 10 | Fill #1

## 2017-06-03 MED FILL — !LANTUS 100 UNITS/ML VIAL: 100 | 22 days supply | Qty: 10 | Fill #2

## 2017-06-03 MED FILL — ?LEVOTHYROXINE 150 MCG TAB: 150 | 30 days supply | Qty: 30 | Fill #3

## 2017-06-03 MED FILL — metFORMIN HCL 1000 MG TABS: 1000 | 30 days supply | Qty: 60 | Fill #2

## 2017-06-03 MED FILL — !HUMALOG 100 UNITS/ML VIAL: 100 | 28 days supply | Qty: 10 | Fill #1

## 2017-06-27 ENCOUNTER — Ambulatory Visit (INDEPENDENT_AMBULATORY_CARE_PROVIDER_SITE_OTHER): Payer: Self-pay | Admitting: Physician Assistant

## 2017-06-27 ENCOUNTER — Encounter (INDEPENDENT_AMBULATORY_CARE_PROVIDER_SITE_OTHER): Payer: Self-pay | Admitting: Physician Assistant

## 2017-06-27 ENCOUNTER — Other Ambulatory Visit: Payer: Self-pay

## 2017-06-27 VITALS — BP 99/62 | HR 68 | Temp 98.1°F | Ht 63.0 in | Wt 159.6 lb

## 2017-06-27 DIAGNOSIS — E1142 Type 2 diabetes mellitus with diabetic polyneuropathy: Secondary | ICD-10-CM

## 2017-06-27 DIAGNOSIS — E7841 Elevated Lipoprotein(a): Secondary | ICD-10-CM

## 2017-06-27 DIAGNOSIS — Z23 Encounter for immunization: Secondary | ICD-10-CM

## 2017-06-27 DIAGNOSIS — E039 Hypothyroidism, unspecified: Secondary | ICD-10-CM

## 2017-06-27 DIAGNOSIS — Z794 Long term (current) use of insulin: Secondary | ICD-10-CM

## 2017-06-27 DIAGNOSIS — F418 Other specified anxiety disorders: Secondary | ICD-10-CM

## 2017-06-27 LAB — POCT GLYCOSYLATED HEMOGLOBIN (HGB A1C): Hemoglobin A1C: 8.5 % — AB (ref 4.0–5.6)

## 2017-06-27 MED ORDER — INSULIN GLARGINE 100 UNIT/ML ~~LOC~~ SOLN: 60.0000 [IU] | Freq: Every day | SUBCUTANEOUS | 5 refills | Status: DC

## 2017-06-27 MED ORDER — HYDROXYZINE HCL 10 MG PO TABS: 10.0000 mg | ORAL_TABLET | Freq: Two times a day (BID) | ORAL | 1 refills | Status: DC | PRN

## 2017-06-27 MED ORDER — ESCITALOPRAM OXALATE 10 MG PO TABS: 10.0000 mg | ORAL_TABLET | Freq: Every day | ORAL | 5 refills | Status: DC

## 2017-06-27 MED FILL — hydrOXYzine HCL 10 MG TABS: 10 | 30 days supply | Qty: 60 | Fill #0

## 2017-06-27 MED FILL — ESCITALOPRAM 10 MG TABLET: 10 | 30 days supply | Qty: 30 | Fill #1

## 2017-06-27 MED FILL — !LANTUS 100 UNITS/ML VIAL: 100 | 33 days supply | Qty: 20 | Fill #0

## 2017-06-27 NOTE — Progress Notes (Signed)
Subjective:  Patient ID: Audrey Mcmillan, female    DOB: April 23, 1980  Age: 37 y.o. MRN: [id number]  CC: f/u DM/HLD  HPI  Audrey Mcmillan a 37 y.o.femalewith amedical history of depression, anxiety, DM2, HLD, and hypothyroidism presents to f/u on DM, HLD, and hypothyroidism. Last A1c 11.8% four months ago and A1c 8.5% today. Blood sugar reading usually ranging anywhere from 80s to 245. Pt not exercising. Has reduced her carbohydrate intake somewhat. Feels stressed from issues with her son. Has anxiety from dealing with her son and has chest tightening during stressful situations. No chest symptoms aside from stressful situations. Has not taken escitalopram for a couple of months. Does not endorse any other symptoms.       Outpatient Medications Prior to Visit  Medication Sig Dispense Refill  . atorvastatin (LIPITOR) 40 MG tablet Take 2 tablets (80 mg total) by mouth daily. 60 tablet 11  . escitalopram (LEXAPRO) 10 MG tablet Take 1 tablet (10 mg total) by mouth daily. 30 tablet 5  . insulin glargine (LANTUS) 100 UNIT/ML injection Inject 0.45 mLs (45 Units total) into the skin at bedtime. 10 mL 5  . insulin lispro (HUMALOG) 100 UNIT/ML injection Use per sliding scale: If sugar 150-200 take 2 units If sugar 201-251 take 4 units If sugar 251-300 take 6 units If sugar 301-350 take 8 units If sugar 351-400 take 10 units 10 mL 11  . Insulin Syringes, Disposable, U-100 1 ML MISC Inject 1 application into the skin 4 (four) times daily. 120 each 5  . levothyroxine (SYNTHROID, LEVOTHROID) 150 MCG tablet Take 1 tablet (150 mcg total) by mouth daily before breakfast. 30 tablet 5  . metFORMIN (GLUCOPHAGE) 1000 MG tablet Take 1 tablet (1,000 mg total) by mouth 2 (two) times daily with a meal. 60 tablet 5  . levothyroxine (SYNTHROID, LEVOTHROID) 150 MCG tablet Take 150 mcg by mouth daily.  0   No facility-administered medications prior to visit.      ROS Review of Systems   Constitutional: Negative for chills, fever and malaise/fatigue.  Eyes: Negative for blurred vision.  Respiratory: Negative for shortness of breath.   Cardiovascular: Negative for chest pain and palpitations.  Gastrointestinal: Negative for abdominal pain and nausea.  Genitourinary: Negative for dysuria and hematuria.  Musculoskeletal: Negative for joint pain and myalgias.  Skin: Negative for rash.  Neurological: Negative for tingling and headaches.  Psychiatric/Behavioral: Negative for depression. The patient is nervous/anxious.     Objective:  BP 99/62 (BP Location: Left Arm, Patient Position: Sitting, Cuff Size: Normal)   Pulse 68   Temp 98.1 F (36.7 C) (Oral)   Ht 5\' 3"  (1.6 m)   Wt 159 lb 9.6 oz (72.4 kg)   LMP 05/07/2017 (Approximate)   SpO2 100%   BMI 28.27 kg/m   BP/Weight 06/27/2017 04/28/8293 06/24/1306  Systolic BP 99 657 846  Diastolic BP 62 58 68  Wt. (Lbs) 159.6 156.4 153  BMI 28.27 27.71 27.1      Physical Exam  Constitutional: She is oriented to person, place, and time.  Well developed, well nourished, NAD, polite  HENT:  Head: Normocephalic and atraumatic.  Eyes: No scleral icterus.  Neck: Normal range of motion. Neck supple. No thyromegaly present.  Cardiovascular: Normal rate, regular rhythm, normal heart sounds and intact distal pulses. Exam reveals no gallop and no friction rub.  No murmur heard. Pulmonary/Chest: Effort normal and breath sounds normal.  Musculoskeletal: She exhibits no edema.  Neurological: She is alert and oriented  to person, place, and time.  Skin: Skin is warm and dry. No rash noted. No erythema. No pallor.  Psychiatric: She has a normal mood and affect. Her behavior is normal. Thought content normal.  Vitals reviewed.    Assessment & Plan:    1. Type 2 diabetes mellitus with diabetic polyneuropathy, with long-term current use of insulin (HCC) - HgB A1c 8.5% - Microalbumin / creatinine urine ratio - Increase insulin  glargine (LANTUS) 100 UNIT/ML injection; Inject 0.6 mLs (60 Units total) into the skin at bedtime.  Dispense: 10 mL; Refill: 5. Explained how to gradually increase insulin. Advised to check blood sugar TID and call if hypoglycemic.  - Basic Metabolic Panel  2. Acquired hypothyroidism - Thyroid Panel With TSH - Basic Metabolic Panel  3. Elevated lipoprotein(a) - Lipid panel  4. Depression with anxiety - hydrOXYzine (ATARAX/VISTARIL) 10 MG tablet; Take 1 tablet (10 mg total) by mouth 2 (two) times daily as needed.  Dispense: 60 tablet; Refill: 1 - escitalopram (LEXAPRO) 10 MG tablet; Take 1 tablet (10 mg total) by mouth daily.  Dispense: 30 tablet; Refill: 5  5. Need for prophylactic vaccination against Streptococcus pneumoniae (pneumococcus) - Pneumococcal polysaccharide vaccine 23-valent greater than or equal to 2yo subcutaneous/IM   Meds ordered this encounter  Medications  . hydrOXYzine (ATARAX/VISTARIL) 10 MG tablet    Sig: Take 1 tablet (10 mg total) by mouth 2 (two) times daily as needed.    Dispense:  60 tablet    Refill:  1    Order Specific Question:   Supervising Provider    Answer:   Charlott Rakes [[id number]]  . escitalopram (LEXAPRO) 10 MG tablet    Sig: Take 1 tablet (10 mg total) by mouth daily.    Dispense:  30 tablet    Refill:  5    Order Specific Question:   Supervising Provider    Answer:   Charlott Rakes [[id number]]  . insulin glargine (LANTUS) 100 UNIT/ML injection    Sig: Inject 0.6 mLs (60 Units total) into the skin at bedtime.    Dispense:  10 mL    Refill:  5    Order Specific Question:   Supervising Provider    Answer:   Charlott Rakes [[id number]]    Follow-up: Return in about 6 weeks (around 08/08/2017) for DM.   Clent Demark PA

## 2017-06-27 NOTE — Patient Instructions (Signed)
Vacuna antineumoccica de polisacridos: Lo que debe saber (Pneumococcal Polysaccharide Vaccine: What You Need to Know) 1. Por qu vacunarse? La vacunacin puede proteger a los [employer] (y a algunos nios y adultos ms jvenes) de la enfermedad neumoccica. La enfermedad neumoccica es causada por una bacteria que puede contagiarse de [place] persona a otra por contacto directo. Puede provocar infecciones en los odos y tambin infecciones ms graves en:  los pulmones (neumona),  la sangre (bacteriemia), y  las membranas que cubren el cerebro y la mdula espinal (meningitis). La meningitis puede causar sordera y dao cerebral, y puede ser mortal. Cualquier persona puede contraer la enfermedad neumoccica, pero los nios menores de 2 aos, las personas con [employer], los adultos mayores de 44 aos y los fumadores son los que corren un mayor riesgo. Cada ao, mueren alrededor de [employer] a causa de la enfermedad [employer] [place]. El tratamiento de las infecciones neumoccicas con penicilina y otros medicamentos era ms efectivo en el pasado. Sin embargo, algunas cepas de la bacteria que causa la enfermedad se han vuelto resistentes a estos medicamentos. Esto hace que la prevencin de la enfermedad mediante la vacunacin sea an ms importante. 2. Edward Jolly antineumoccica de polisacridos (PPSV23) La vacuna antineumoccica de polisacridos (PPSV23) protege contra 23tipos de bacterias neumoccicas. No previene todas las enfermedades neumoccicas. La vacuna PPSV23 se recomienda para las siguientes personas:  Todos los adultos de 65aos en adelante.  Cleora Fleet persona de 2a 64aos que tenga un problema de salud a Barrister's clerk.  [employer] de 2 a 65 aos que tengan el sistema inmunitario dbil.  Los adultos de 19 a 64 aos que fumen o tengan asma. [place] solo necesitan una dosis de la vacuna PPSV. Para ciertos grupos de alto  riesgo se recomienda una segunda dosis. Las [employer] de 65 aos deben recibir una dosis de la vacuna, aun si recibieron una o ms dosis antes de [employer]. El mdico puede brindarle ms informacin sobre estas recomendaciones. La mayora de los adultos sanos adquiere proteccin 2 a 3semanas despus de haber recibido la vacuna. 3. Algunas personas no deben recibir la vacuna  Cleora Fleet persona que haya sufrido una reaccin alrgica potencialmente mortal a la PPSV no debe recibir otra dosis.  Las personas que tengan Buyer, retail grave a los componentes de la vacuna PPSV no deben recibirla. Informe a su mdico si sufre de algn tipo de alergia grave.  Cleora Fleet persona que est enferma en un grado moderado o grave el da en que est programada la vacunacin, probablemente deba esperar a recuperarse para recibirla. Por lo general, una persona con una enfermedad leve puede vacunarse.  Los nios menores de 2 aos no deben recibir [employer].  No hay pruebas de que la vacuna PPSV sea perjudicial para las mujeres embarazadas o el feto. No obstante, como precaucin, las mujeres que necesiten aplicarse la vacuna deben hacerlo antes de quedar embarazadas, si es posible.  4. Riesgos de [place] reaccin a la vacuna Con cualquier medicamento, incluyendo las vacunas, existe la posibilidad de que aparezcan efectos secundarios. Estos son leves y desaparecen por s solos, pero tambin son posibles las reacciones graves. Aproximadamente la mitad de las personas que reciben la PPSV presentan efectos secundarios leves, como enrojecimiento o Management consultant donde se aplic la vacuna, que desaparecen a [place]. Menos de 1 de cada 100personas presenta fiebre, dolores musculares o reacciones locales ms graves. Problemas que podran ocurrir despus  de cualquier vacuna:  [employer] a veces se desmayan despus de un procedimiento mdico, incluso despus de recibir Engineer, drilling. Si permanece sentado o recostado  durante 15 minutos puede ayudar a [employer] y las lesiones causadas por las cadas. Informe al mdico si se siente mareado, tiene cambios en la visin o zumbidos en los odos.  Algunas personas sienten un dolor intenso en el hombro y tienen dificultad para mover el brazo donde se coloc la vacuna. Esto sucede con muy poca frecuencia.  Cualquier medicamento puede causar una reaccin alrgica grave. Dichas reacciones son Orlene Erm poco frecuentes con una vacuna (se calcula que menos de 1en un milln de dosis) y se producen unos minutos a unas horas despus de la vacunacin. Al igual que con cualquier [employer], existe una probabilidad remota de que una vacuna cause una lesin grave o la [place]. Se controla permanentemente la seguridad de las vacunas. Para obtener ms informacin, visite: http://www.aguilar.org/. 5. Qu pasa si hay una reaccin grave? A qu signos debo estar atento? Observe todo lo que le preocupe, como signos de una reaccin alrgica grave, fiebre muy alta o comportamiento fuera de lo normal. Los signos de una reaccin alrgica grave pueden incluir ronchas, hinchazn de la cara y la garganta, dificultad para respirar, latidos cardacos acelerados, mareos y debilidad. Generalmente, estos comenzaran entre unos pocos minutos y algunas horas despus de la vacunacin. Qu debo hacer? Si usted piensa que se trata de una reaccin alrgica grave o de otra emergencia que no puede esperar, llame al 911 o dirjase al hospital ms cercano. Sino, llame a su mdico. Despus, la reaccin debe informarse al Sistema de Informacin sobre Efectos Adversos de las [place] (Vaccine Adverse Event Reporting System, VAERS). Su mdico puede presentar este informe, o puede hacerlo usted mismo a travs del sitio web de VAERS, en www.vaers.SamedayNews.es, o llamando al (858)176-2563. VAERS no brinda recomendaciones mdicas. 6. Cmo puedo obtener ms informacin?  Consulte a su mdico. Este puede darle el  prospecto de la vacuna o recomendarle otras fuentes de informacin.  Comunquese con el servicio de salud de su localidad o su estado.  Comunquese con los Centros para Building surveyor y la Prevencin de Probation officer for Disease Control and Prevention , CDC). ? Llame al [phone] (1-800-CDC-INFO), o ? Visite el sitio Biomedical engineer en http://hunter.com/. Declaracin de informacin sobre la vacuna antineumoccica de polisacridos de los CDC (05/15/13) Esta informacin no tiene Marine scientist el consejo del mdico. Asegrese de hacerle al mdico cualquier pregunta que tenga. Document Released: 04/06/2008 Document Revised: 01/29/2014 Document Reviewed: 05/18/2013 Elsevier Interactive Patient Education  2017 [employer].

## 2017-06-28 LAB — THYROID PANEL WITH TSH
FREE THYROXINE INDEX: 2.5 (ref 1.2–4.9)
T3 Uptake Ratio: 26 % (ref 24–39)
T4, Total: 9.6 ug/dL (ref 4.5–12.0)
TSH: 0.608 u[IU]/mL (ref 0.450–4.500)

## 2017-06-28 LAB — BASIC METABOLIC PANEL
BUN / CREAT RATIO: 13 (ref 9–23)
BUN: 7 mg/dL (ref 6–20)
CHLORIDE: 102 mmol/L (ref 96–106)
CO2: 24 mmol/L (ref 20–29)
Calcium: 9.7 mg/dL (ref 8.7–10.2)
Creatinine, Ser: 0.52 mg/dL — ABNORMAL LOW (ref 0.57–1.00)
GFR calc Af Amer: 143 mL/min/{1.73_m2} (ref >59)
GFR calc non Af Amer: 124 mL/min/{1.73_m2} (ref >59)
GLUCOSE: 156 mg/dL — AB (ref 65–99)
Potassium: 4.2 mmol/L (ref 3.5–5.2)
SODIUM: 140 mmol/L (ref 134–144)

## 2017-06-28 LAB — LIPID PANEL
CHOLESTEROL TOTAL: 151 mg/dL (ref 100–199)
Chol/HDL Ratio: 4.4 ratio (ref 0.0–4.4)
HDL: 34 mg/dL — ABNORMAL LOW (ref >39)
LDL Calculated: 84 mg/dL (ref 0–99)
Triglycerides: 166 mg/dL — ABNORMAL HIGH (ref 0–149)
VLDL Cholesterol Cal: 33 mg/dL (ref 5–40)

## 2017-06-28 LAB — MICROALBUMIN / CREATININE URINE RATIO: Creatinine, Urine: 52.9 mg/dL

## 2017-07-01 ENCOUNTER — Other Ambulatory Visit (INDEPENDENT_AMBULATORY_CARE_PROVIDER_SITE_OTHER): Payer: Self-pay | Admitting: Physician Assistant

## 2017-07-01 DIAGNOSIS — E7841 Elevated Lipoprotein(a): Secondary | ICD-10-CM

## 2017-07-01 DIAGNOSIS — E039 Hypothyroidism, unspecified: Secondary | ICD-10-CM

## 2017-07-01 DIAGNOSIS — E781 Pure hyperglyceridemia: Secondary | ICD-10-CM

## 2017-07-01 MED ORDER — LEVOTHYROXINE SODIUM 150 MCG PO TABS: 150.0000 ug | ORAL_TABLET | Freq: Every day | ORAL | 11 refills | Status: DC

## 2017-07-01 MED ORDER — ATORVASTATIN CALCIUM 40 MG PO TABS: 80.0000 mg | ORAL_TABLET | Freq: Every day | ORAL | 11 refills | Status: DC

## 2017-07-01 MED FILL — ?LEVOTHYROXINE 150 MCG TAB: 150 | 30 days supply | Qty: 30 | Fill #0

## 2017-07-01 MED FILL — ATORVASTATIN CALCIUM 40 MG: 40 | 30 days supply | Qty: 60 | Fill #0

## 2017-07-05 ENCOUNTER — Telehealth (INDEPENDENT_AMBULATORY_CARE_PROVIDER_SITE_OTHER): Payer: Self-pay

## 2017-07-05 NOTE — Telephone Encounter (Signed)
Left message asking patient to return call to [facility]. If patient calls please provide the following results. Thyroid at normal levels. Triglycerides mildly elevated, abstain from too many sweets/carbs. Rest of labs normal. Meds/refills have been sent to her pharmacy at [facility]. Please remind to take protective measures against pregnancy while taking atorvastatin. Nat Christen, CMA

## 2017-07-05 NOTE — Telephone Encounter (Signed)
-----   Message from Clent Demark, PA-C sent at 07/01/2017  8:46 AM EDT ----- Thyroid at normal levels. Triglycerides mildly elevated, abstain from too many sweets/carbs. Rest of labs normal. I will send meds/refills to her pharmacy at [facility]. Please remind to take protective measures against pregnancy while taking atorvastatin.

## 2017-07-19 MED FILL — metFORMIN HCL 1000 MG TABS: 1000 | 30 days supply | Qty: 60 | Fill #3

## 2017-08-08 ENCOUNTER — Ambulatory Visit (INDEPENDENT_AMBULATORY_CARE_PROVIDER_SITE_OTHER): Payer: Self-pay | Admitting: Physician Assistant

## 2017-08-08 ENCOUNTER — Encounter (INDEPENDENT_AMBULATORY_CARE_PROVIDER_SITE_OTHER): Payer: Self-pay | Admitting: Physician Assistant

## 2017-08-08 ENCOUNTER — Other Ambulatory Visit: Payer: Self-pay

## 2017-08-08 VITALS — BP 108/70 | HR 67 | Temp 98.1°F | Ht 63.0 in | Wt 162.0 lb

## 2017-08-08 DIAGNOSIS — E1142 Type 2 diabetes mellitus with diabetic polyneuropathy: Secondary | ICD-10-CM

## 2017-08-08 DIAGNOSIS — Z114 Encounter for screening for human immunodeficiency virus [HIV]: Secondary | ICD-10-CM

## 2017-08-08 DIAGNOSIS — Z794 Long term (current) use of insulin: Secondary | ICD-10-CM

## 2017-08-08 DIAGNOSIS — E039 Hypothyroidism, unspecified: Secondary | ICD-10-CM

## 2017-08-08 MED ORDER — "INSULIN SYRINGE 30G X 5/16"" 1 ML MISC": 1.0000 "application " | Freq: Four times a day (QID) | 11 refills | Status: DC

## 2017-08-08 MED ORDER — LEVOTHYROXINE SODIUM 150 MCG PO TABS: 150.0000 ug | ORAL_TABLET | Freq: Every day | ORAL | 11 refills | Status: DC

## 2017-08-08 MED ORDER — METFORMIN HCL 1000 MG PO TABS: 1000.0000 mg | ORAL_TABLET | Freq: Two times a day (BID) | ORAL | 11 refills | Status: DC

## 2017-08-08 MED ORDER — INSULIN LISPRO 100 UNIT/ML ~~LOC~~ SOLN: SUBCUTANEOUS | 11 refills | Status: DC

## 2017-08-08 MED ORDER — INSULIN GLARGINE 100 UNIT/ML ~~LOC~~ SOLN: SUBCUTANEOUS | 11 refills | Status: DC

## 2017-08-08 MED FILL — !LANTUS 100 UNITS/ML VIAL: 100 | 26 days supply | Qty: 20 | Fill #0

## 2017-08-08 MED FILL — !HUMALOG 100 UNITS/ML VIAL: 100 | 28 days supply | Qty: 10 | Fill #2

## 2017-08-08 MED FILL — ?LEVOTHYROXINE 150 MCG TAB: 150 | 30 days supply | Qty: 30 | Fill #0

## 2017-08-08 NOTE — Patient Instructions (Signed)
Diabetes Mellitus and Nutrition When you have diabetes (diabetes mellitus), it is very important to have healthy eating habits because your blood sugar (glucose) levels are greatly affected by what you eat and drink. Eating healthy foods in the appropriate amounts, at about the same times every day, can help you:  Control your blood glucose.  Lower your risk of heart disease.  Improve your blood pressure.  Reach or maintain a healthy weight.  Every person with diabetes is different, and each person has different needs for a meal plan. Your health care provider may recommend that you work with a diet and nutrition specialist (dietitian) to make a meal plan that is best for you. Your meal plan may vary depending on factors such as:  The calories you need.  The medicines you take.  Your weight.  Your blood glucose, blood pressure, and cholesterol levels.  Your activity level.  Other health conditions you have, such as heart or kidney disease.  How do carbohydrates affect me? Carbohydrates affect your blood glucose level more than any other type of food. Eating carbohydrates naturally increases the amount of glucose in your blood. Carbohydrate counting is a method for keeping track of how many carbohydrates you eat. Counting carbohydrates is important to keep your blood glucose at a healthy level, especially if you use insulin or take certain oral diabetes medicines. It is important to know how many carbohydrates you can safely have in each meal. This is different for every person. Your dietitian can help you calculate how many carbohydrates you should have at each meal and for snack. Foods that contain carbohydrates include:  Bread, cereal, rice, pasta, and crackers.  Potatoes and corn.  Peas, beans, and lentils.  Milk and yogurt.  Fruit and juice.  Desserts, such as cakes, cookies, ice cream, and candy.  How does alcohol affect me? Alcohol can cause a sudden decrease in blood  glucose (hypoglycemia), especially if you use insulin or take certain oral diabetes medicines. Hypoglycemia can be a life-threatening condition. Symptoms of hypoglycemia (sleepiness, dizziness, and confusion) are similar to symptoms of having too much alcohol. If your health care provider says that alcohol is safe for you, follow these guidelines:  Limit alcohol intake to no more than 1 drink per day for nonpregnant women and 2 drinks per day for men. One drink equals 12 oz of beer, 5 oz of wine, or 1 oz of hard liquor.  Do not drink on an empty stomach.  Keep yourself hydrated with water, diet soda, or unsweetened iced tea.  Keep in mind that regular soda, juice, and other mixers may contain a lot of sugar and must be counted as carbohydrates.  What are tips for following this plan? Reading food labels  Start by checking the serving size on the label. The amount of calories, carbohydrates, fats, and other nutrients listed on the label are based on one serving of the food. Many foods contain more than one serving per package.  Check the total grams (g) of carbohydrates in one serving. You can calculate the number of servings of carbohydrates in one serving by dividing the total carbohydrates by 15. For example, if a food has 30 g of total carbohydrates, it would be equal to 2 servings of carbohydrates.  Check the number of grams (g) of saturated and trans fats in one serving. Choose foods that have low or no amount of these fats.  Check the number of milligrams (mg) of sodium in one serving. Most people   should limit total sodium intake to less than 2,300 mg per day.  Always check the nutrition information of foods labeled as "low-fat" or "nonfat". These foods may be higher in added sugar or refined carbohydrates and should be avoided.  Talk to your dietitian to identify your daily goals for nutrients listed on the label. Shopping  Avoid buying canned, premade, or processed foods. These  foods tend to be high in fat, sodium, and added sugar.  Shop around the outside edge of the grocery store. This includes fresh fruits and vegetables, bulk grains, fresh meats, and fresh dairy. Cooking  Use low-heat cooking methods, such as baking, instead of high-heat cooking methods like deep frying.  Cook using healthy oils, such as olive, canola, or sunflower oil.  Avoid cooking with butter, cream, or high-fat meats. Meal planning  Eat meals and snacks regularly, preferably at the same times every day. Avoid going long periods of time without eating.  Eat foods high in fiber, such as fresh fruits, vegetables, beans, and whole grains. Talk to your dietitian about how many servings of carbohydrates you can eat at each meal.  Eat 4-6 ounces of lean protein each day, such as lean meat, chicken, fish, eggs, or tofu. 1 ounce is equal to 1 ounce of meat, chicken, or fish, 1 egg, or 1/4 cup of tofu.  Eat some foods each day that contain healthy fats, such as avocado, nuts, seeds, and fish. Lifestyle   Check your blood glucose regularly.  Exercise at least 30 minutes 5 or more days each week, or as told by your health care provider.  Take medicines as told by your health care provider.  Do not use any products that contain nicotine or tobacco, such as cigarettes and e-cigarettes. If you need help quitting, ask your health care provider.  Work with a counselor or diabetes educator to identify strategies to manage stress and any emotional and social challenges. What are some questions to ask my health care provider?  Do I need to meet with a diabetes educator?  Do I need to meet with a dietitian?  What number can I call if I have questions?  When are the best times to check my blood glucose? Where to find more information:  American Diabetes Association: diabetes.org/food-and-fitness/food  Academy of Nutrition and Dietetics:  www.eatright.org/resources/health/diseases-and-conditions/diabetes  National Institute of Diabetes and Digestive and Kidney Diseases (NIH): www.niddk.nih.gov/health-information/diabetes/overview/diet-eating-physical-activity Summary  A healthy meal plan will help you control your blood glucose and maintain a healthy lifestyle.  Working with a diet and nutrition specialist (dietitian) can help you make a meal plan that is best for you.  Keep in mind that carbohydrates and alcohol have immediate effects on your blood glucose levels. It is important to count carbohydrates and to use alcohol carefully. This information is not intended to replace advice given to you by your health care provider. Make sure you discuss any questions you have with your health care provider. Document Released: 10/05/2004 Document Revised: 02/13/2016 Document Reviewed: 02/13/2016 Elsevier Interactive Patient Education  2018 Elsevier Inc.  

## 2017-08-08 NOTE — Progress Notes (Signed)
Subjective:  Patient ID: Audrey Mcmillan, female    DOB: 1980-07-19  Age: 37 y.o. MRN: [id number]  CC: f/u DM  HPI Audrey Mcmillan a 37 y.o.femalewith amedical history of depression, anxiety, DM2, HLD, and hypothyroidism presents to f/u on DM, HLD, and hypothyroidism. Last A1c 8.5% six weeks ago. Had Lantus increased to 60 units qhs. Glucometer readings are ranging from 85-200 but regularly around 150. Has adjusted diet to include less carbs. 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 Does not endorse any symptoms or complaints.       Outpatient Medications Prior to Visit  Medication Sig Dispense Refill  . atorvastatin (LIPITOR) 40 MG tablet Take 2 tablets (80 mg total) by mouth daily. 60 tablet 11  . escitalopram (LEXAPRO) 10 MG tablet Take 1 tablet (10 mg total) by mouth daily. 30 tablet 5  . hydrOXYzine (ATARAX/VISTARIL) 10 MG tablet Take 1 tablet (10 mg total) by mouth 2 (two) times daily as needed. 60 tablet 1  . insulin glargine (LANTUS) 100 UNIT/ML injection Inject 0.6 mLs (60 Units total) into the skin at bedtime. 10 mL 5  . insulin lispro (HUMALOG) 100 UNIT/ML injection Use per sliding scale: If sugar 150-200 take 2 units If sugar 201-251 take 4 units If sugar 251-300 take 6 units If sugar 301-350 take 8 units If sugar 351-400 take 10 units 10 mL 11  . Insulin Syringes, Disposable, U-100 1 ML MISC Inject 1 application into the skin 4 (four) times daily. 120 each 5  . levothyroxine (SYNTHROID, LEVOTHROID) 150 MCG tablet Take 1 tablet (150 mcg total) by mouth daily before breakfast. 30 tablet 11  . metFORMIN (GLUCOPHAGE) 1000 MG tablet Take 1 tablet (1,000 mg total) by mouth 2 (two) times daily with a meal. 60 tablet 5   No facility-administered medications prior to visit.      ROS Review of Systems  Constitutional: Negative for chills, fever and malaise/fatigue.  Eyes: Negative for blurred vision.  Respiratory: Negative for  shortness of breath.   Cardiovascular: Negative for chest pain and palpitations.  Gastrointestinal: Negative for abdominal pain and nausea.  Genitourinary: Negative for dysuria and hematuria.  Musculoskeletal: Negative for joint pain and myalgias.  Skin: Negative for rash.  Neurological: Negative for tingling and headaches.  Psychiatric/Behavioral: Negative for depression. The patient is not nervous/anxious.     Objective:  BP 108/70 (BP Location: Left Arm, Patient Position: Sitting, Cuff Size: Normal)   Pulse 67   Temp 98.1 F (36.7 C) (Oral)   Ht 5\' 3"  (1.6 m)   Wt 162 lb (73.5 kg)   SpO2 97%   BMI 28.70 kg/m   BP/Weight 08/08/2017 08/23/4233 03/27/1441  Systolic BP 154 99 008  Diastolic BP 70 62 58  Wt. (Lbs) 162 159.6 156.4  BMI 28.7 28.27 27.71      Physical Exam  Constitutional: She is oriented to person, place, and time.  Well developed, well nourished, NAD, polite  HENT:  Head: Normocephalic and atraumatic.  Eyes: No scleral icterus.  Neck: Normal range of motion. Neck supple. No thyromegaly present.  Cardiovascular: Normal rate, regular rhythm and normal heart sounds.  Pulmonary/Chest: Effort normal and breath sounds normal.  Musculoskeletal: She exhibits no edema.  Neurological: She is alert and oriented to person, place, and time.  Skin: Skin is warm and dry. No rash noted. No erythema. No pallor.  Psychiatric: She has a normal mood and affect. Her behavior is normal. Thought content normal.  Vitals reviewed.    Assessment & Plan:    1. Hypothyroidism, unspecified type - Refill levothyroxine (SYNTHROID, LEVOTHROID) 150 MCG tablet; Take 1 tablet (150 mcg total) by mouth daily before breakfast.  Dispense: 30 tablet; Refill: 11  2. Type 2 diabetes mellitus with diabetic polyneuropathy, with long-term current use of insulin (HCC) - Refill metFORMIN (GLUCOPHAGE) 1000 MG tablet; Take 1 tablet (1,000 mg total) by mouth 2 (two) times daily with a meal.  Dispense:  60 tablet; Refill: 11 - Refill insulin lispro (HUMALOG) 100 UNIT/ML injection; Use per sliding scale: If sugar 150-200 take 2 units If sugar 201-251 take 4 units If sugar 251-300 take 6 units If sugar 301-350 take 8 units If sugar 351-400 take 10 units  Dispense: 10 mL; Refill: 11 - Increase insulin glargine (LANTUS) 100 UNIT/ML injection; Inject 60 units at bedtime and 15 units in the morning.  Dispense: 10 mL; Refill: 11. Take 15 units after breakfast. - Refill Insulin Syringe-Needle U-100 (INSULIN SYRINGE 1CC/30GX5/16") 30G X 5/16" 1 ML MISC; Inject 1 application into the skin 4 (four) times daily.  Dispense: 120 each; Refill: 11  3. Screening for HIV (human immunodeficiency virus) - HIV antibody   Meds ordered this encounter  Medications  . metFORMIN (GLUCOPHAGE) 1000 MG tablet    Sig: Take 1 tablet (1,000 mg total) by mouth 2 (two) times daily with a meal.    Dispense:  60 tablet    Refill:  11    Order Specific Question:   Supervising Provider    Answer:   Charlott Rakes [[id number]]  . levothyroxine (SYNTHROID, LEVOTHROID) 150 MCG tablet    Sig: Take 1 tablet (150 mcg total) by mouth daily before breakfast.    Dispense:  30 tablet    Refill:  11    Order Specific Question:   Supervising Provider    Answer:   Charlott Rakes [[id number]]  . insulin lispro (HUMALOG) 100 UNIT/ML injection    Sig: Use per sliding scale: If sugar 150-200 take 2 units If sugar 201-251 take 4 units If sugar 251-300 take 6 units If sugar 301-350 take 8 units If sugar 351-400 take 10 units    Dispense:  10 mL    Refill:  11    Order Specific Question:   Supervising Provider    Answer:   Charlott Rakes [[id number]]  . insulin glargine (LANTUS) 100 UNIT/ML injection    Sig: Inject 60 units at bedtime and 15 units in the morning.    Dispense:  10 mL    Refill:  11    Order Specific Question:   Supervising Provider    Answer:   Charlott Rakes [[id number]]  . Insulin Syringe-Needle U-100 (INSULIN SYRINGE  1CC/30GX5/16") 30G X 5/16" 1 ML MISC    Sig: Inject 1 application into the skin 4 (four) times daily.    Dispense:  120 each    Refill:  11    Order Specific Question:   Supervising Provider    Answer:   Charlott Rakes [[id number]]    Follow-up: Return in about 3 months (around 11/08/2017) for diabetes.   Clent Demark PA

## 2017-08-09 ENCOUNTER — Telehealth (INDEPENDENT_AMBULATORY_CARE_PROVIDER_SITE_OTHER): Payer: Self-pay

## 2017-08-09 LAB — HIV ANTIBODY (ROUTINE TESTING W REFLEX): HIV Screen 4th Generation wRfx: NONREACTIVE

## 2017-08-09 NOTE — Telephone Encounter (Signed)
-----   Message from Clent Demark, PA-C sent at 08/09/2017 12:19 PM EDT ----- HIV negative.

## 2017-08-09 NOTE — Telephone Encounter (Signed)
Call placed using pacific interpreter Nevin Bloodgood [phone]) left message asking patient to call [facility]. If patient returns call please notify her that HIV is negative. Thank You. Nat Christen, CMA

## 2017-08-13 ENCOUNTER — Telehealth (INDEPENDENT_AMBULATORY_CARE_PROVIDER_SITE_OTHER): Payer: Self-pay

## 2017-08-13 ENCOUNTER — Encounter (INDEPENDENT_AMBULATORY_CARE_PROVIDER_SITE_OTHER): Payer: Self-pay

## 2017-08-13 NOTE — Telephone Encounter (Signed)
Call placed using pacific interpreter [phone] left patient another message asking to call [facility] at [phone]. Results have been mailed. Nat Christen, CMA

## 2017-08-13 NOTE — Telephone Encounter (Signed)
-----   Message from Clent Demark, PA-C sent at 08/09/2017 12:19 PM EDT ----- HIV negative.

## 2017-09-03 MED FILL — metFORMIN HCL 1000 MG TABS: 1000 | 30 days supply | Qty: 60 | Fill #4

## 2017-09-03 MED FILL — ATORVASTATIN CALCIUM 40 MG: 40 | 30 days supply | Qty: 60 | Fill #1

## 2017-09-03 MED FILL — !LANTUS 100 UNITS/ML VIAL: 100 | 26 days supply | Qty: 20 | Fill #1

## 2017-09-03 MED FILL — ?LEVOTHYROXINE 150 MCG TAB: 150 | 30 days supply | Qty: 30 | Fill #1

## 2017-10-10 MED FILL — ?LEVOTHYROXINE 150 MCG TAB: 150 | 30 days supply | Qty: 30 | Fill #2

## 2017-10-10 MED FILL — !LANTUS 100 UNITS/ML VIAL: 100 | 26 days supply | Qty: 20 | Fill #2

## 2017-10-10 MED FILL — metFORMIN HCL 1000 MG TABS: 1000 | 30 days supply | Qty: 60 | Fill #5

## 2017-11-08 ENCOUNTER — Other Ambulatory Visit: Payer: Self-pay

## 2017-11-08 ENCOUNTER — Ambulatory Visit (INDEPENDENT_AMBULATORY_CARE_PROVIDER_SITE_OTHER): Payer: Self-pay | Admitting: Physician Assistant

## 2017-11-08 ENCOUNTER — Encounter (INDEPENDENT_AMBULATORY_CARE_PROVIDER_SITE_OTHER): Payer: Self-pay | Admitting: Physician Assistant

## 2017-11-08 VITALS — BP 101/64 | HR 78 | Temp 98.0°F | Ht 63.0 in | Wt 161.6 lb

## 2017-11-08 DIAGNOSIS — Z794 Long term (current) use of insulin: Secondary | ICD-10-CM

## 2017-11-08 DIAGNOSIS — Z23 Encounter for immunization: Secondary | ICD-10-CM

## 2017-11-08 DIAGNOSIS — E1142 Type 2 diabetes mellitus with diabetic polyneuropathy: Secondary | ICD-10-CM

## 2017-11-08 DIAGNOSIS — N979 Female infertility, unspecified: Secondary | ICD-10-CM

## 2017-11-08 DIAGNOSIS — E039 Hypothyroidism, unspecified: Secondary | ICD-10-CM

## 2017-11-08 LAB — POCT GLYCOSYLATED HEMOGLOBIN (HGB A1C): Hemoglobin A1C: 9.6 % — AB (ref 4.0–5.6)

## 2017-11-08 MED ORDER — INSULIN GLARGINE 100 UNIT/ML ~~LOC~~ SOLN: SUBCUTANEOUS | 11 refills | Status: DC

## 2017-11-08 MED ORDER — "INSULIN SYRINGE 30G X 5/16"" 1 ML MISC": 1.0000 "application " | Freq: Two times a day (BID) | 5 refills | Status: DC

## 2017-11-08 MED ORDER — SITAGLIPTIN PHOSPHATE 100 MG PO TABS: 100.0000 mg | ORAL_TABLET | Freq: Every day | ORAL | 11 refills | Status: DC

## 2017-11-08 MED FILL — !LANTUS 100 UNITS/ML VIAL: 100 | 33 days supply | Qty: 30 | Fill #0

## 2017-11-08 MED FILL — TRUEPLUS SYR 0.5ML 30GX5/16: 30G X 5/16" | 50 days supply | Qty: 100 | Fill #0

## 2017-11-08 MED FILL — !JANUVIA 100MG TABLET: 100 | 30 days supply | Qty: 30 | Fill #0

## 2017-11-08 NOTE — Progress Notes (Signed)
Subjective:  Patient ID: Audrey Mcmillan, female    DOB: 06-Jul-1980  Age: 37 y.o. MRN: [id number]  CC: f/u DM  HPI [name] a 37 y.o.femalewith amedical history ofdepression, anxiety,DM2, HLD,and hypothyroidism presents to f/u on DM, HLD, and hypothyroidism. Last A1c 8.5% four months ago. Glucometer readings usually from "150 - 160". 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 Has excluded much sweets and breads from her diet. A1c 9.6% today. States she is feeling well and has complaints besides being unable to conceive.     Complains of inability to conceive over the past 10 years. Has a child of her own and he is 20 years old. Her partner is 3 years old and obese. He has no children of his own. Pt is concerned that she may have had a BTL or sterilization procedure in [place] because it was common to do so without the patient's consent. She gave natural birth, no surgical abdominal/pelvic scars. Pt endorses menstrual irregularity. Has not had a regular menstrual cycle for 5 months out of the last six months.     Outpatient Medications Prior to Visit  Medication Sig Dispense Refill  . atorvastatin (LIPITOR) 40 MG tablet Take 2 tablets (80 mg total) by mouth daily. 60 tablet 11  . insulin glargine (LANTUS) 100 UNIT/ML injection Inject 60 units at bedtime and 15 units in the morning. 10 mL 11  . Insulin Syringe-Needle U-100 (INSULIN SYRINGE 1CC/30GX5/16") 30G X 5/16" 1 ML MISC Inject 1 application into the skin 4 (four) times daily. 120 each 11  . levothyroxine (SYNTHROID, LEVOTHROID) 150 MCG tablet Take 1 tablet (150 mcg total) by mouth daily before breakfast. 30 tablet 11  . metFORMIN (GLUCOPHAGE) 1000 MG tablet Take 1 tablet (1,000 mg total) by mouth 2 (two) times daily with a meal. 60 tablet 11  . insulin lispro (HUMALOG) 100 UNIT/ML injection Use per sliding scale: If sugar 150-200 take 2 units If sugar 201-251 take 4 units If sugar 251-300 take  6 units If sugar 301-350 take 8 units If sugar 351-400 take 10 units (Patient not taking: Reported on 11/08/2017) 10 mL 11   No facility-administered medications prior to visit.      ROS Review of Systems  Constitutional: Negative for chills, fever and malaise/fatigue.  Eyes: Negative for blurred vision.  Respiratory: Negative for shortness of breath.   Cardiovascular: Negative for chest pain and palpitations.  Gastrointestinal: Negative for abdominal pain and nausea.  Genitourinary: Negative for dysuria and hematuria.  Musculoskeletal: Negative for joint pain and myalgias.  Skin: Negative for rash.  Neurological: Negative for tingling and headaches.  Psychiatric/Behavioral: Negative for depression. The patient is not nervous/anxious.     Objective:  BP 101/64 (BP Location: Left Arm, Patient Position: Sitting, Cuff Size: Normal)   Pulse 78   Temp 98 F (36.7 C) (Oral)   Ht 5\' 3"  (1.6 m)   Wt 161 lb 9.6 oz (73.3 kg)   LMP 10/30/2017 (Exact Date)   SpO2 97%   BMI 28.63 kg/m   BP/Weight 11/08/2017 2/37/6283 01/27/1759  Systolic BP 607 371 99  Diastolic BP 64 70 62  Wt. (Lbs) 161.6 162 159.6  BMI 28.63 28.7 28.27      Physical Exam  Constitutional: She is oriented to person, place, and time.  Well developed, well nourished, NAD, polite  HENT:  Head: Normocephalic and atraumatic.  Eyes: No scleral icterus.  Neck: Normal range of motion. Neck supple.  No thyromegaly present.  Cardiovascular: Normal rate, regular rhythm and normal heart sounds.  Pulmonary/Chest: Effort normal and breath sounds normal.  Musculoskeletal: She exhibits no edema.  Neurological: She is alert and oriented to person, place, and time.  Skin: Skin is warm and dry. No rash noted. No erythema. No pallor.  Psychiatric: She has a normal mood and affect. Her behavior is normal. Thought content normal.  Vitals reviewed.    Assessment & Plan:    1. Type 2 diabetes mellitus with diabetic  polyneuropathy, with long-term current use of insulin (HCC) - HgB A1c 9.6% today, up from 8.5% four months ago. - Ambulatory referral to Ophthalmology - Begin sitaGLIPtin (JANUVIA) 100 MG tablet; Take 1 tablet (100 mg total) by mouth daily.  Dispense: 30 tablet; Refill: 11 - Increase insulin glargine (LANTUS) 100 UNIT/ML injection; Inject 60 units at bedtime and 30 units in the morning.  Dispense: 30 mL; Refill: 11 - Refill Insulin Syringe-Needle U-100 (INSULIN SYRINGE 1CC/30GX5/16") 30G X 5/16" 1 ML MISC; Inject 1 application into the skin 2 times daily at 12 noon and 4 pm.  Dispense: 100 each; Refill: 5 - Stop Novolog. - Lipid panel - Basic Metabolic Panel   2. Hypothyroidism, unspecified type - Thyroid Panel With TSH  3. Infertility, female - DHEA-sulfate - FSH/LH - US Pelvis Complete; Future - US Transvaginal Non-OB; Future - I have advised to tell her partner to have a semen analysis done.   4. Need for prophylactic vaccination and inoculation against influenza - Flu Vaccine QUAD 6+ mos PF IM (Fluarix Quad PF)   Meds ordered this encounter  Medications  . sitaGLIPtin (JANUVIA) 100 MG tablet    Sig: Take 1 tablet (100 mg total) by mouth daily.    Dispense:  30 tablet    Refill:  11    Order Specific Question:   Supervising Provider    Answer:   Charlott Rakes [[id number]]  . insulin glargine (LANTUS) 100 UNIT/ML injection    Sig: Inject 60 units at bedtime and 30 units in the morning.    Dispense:  30 mL    Refill:  11    Order Specific Question:   Supervising Provider    Answer:   Charlott Rakes [[id number]]  . Insulin Syringe-Needle U-100 (INSULIN SYRINGE 1CC/30GX5/16") 30G X 5/16" 1 ML MISC    Sig: Inject 1 application into the skin 2 times daily at 12 noon and 4 pm.    Dispense:  100 each    Refill:  5    ICD 10 - E11.42    Order Specific Question:   Supervising Provider    Answer:   Charlott Rakes [[id number]]    Follow-up: Return in about 3 months (around 02/08/2018) for  Diabetes.   Clent Demark PA

## 2017-11-08 NOTE — Patient Instructions (Signed)
Diabetes mellitus y [place] fsica (Diabetes Mellitus and Exercise) Hacer actividad fsica habitualmente es importante para el estado de salud general, en especial si tiene diabetes (diabetes mellitus). La actividad fsica no solo se reduce a Pharmacist, hospital. Aporta muchos beneficios para la salud, como aumentar la fuerza muscular y la densidad sea, y reducir las grasas corporales y Dealer. Esto mejora el estado fsico, la flexibilidad y la resistencia, y todo ello redunda en un mejor estado de salud general. La actividad fsica tiene beneficios adicionales para los diabticos, entre ellos:  Disminuye el apetito.  Ayuda a bajar y Consulting civil engineer glucemia bajo control.  Baja la presin arterial.  Ayuda a controlar las cantidades de sustancias grasas (lpidos) en la [place], como el colesterol y los triglicridos.  Mejora la respuesta del cuerpo a la insulina (optimizacin de la sensibilidad a la insulina).  Reduce la cantidad de insulina que el cuerpo necesita.  Reduce el riesgo de sufrir cardiopata coronaria de la siguiente forma: ? [employer] de colesterol y triglicridos. ? Aumenta los niveles de colesterol bueno. ? Disminuye la glucemia. QU ES EL PLAN DE ACTIVIDADES? El mdico o un educador para la diabetes certificado pueden ayudarlo a Engineer, petroleum del tipo y de la frecuencia de actividad fsica (plan de actividades) adecuado para usted. Asegrese de lo siguiente:  Haga por lo menos 162mnutos semanales de ejercicios de intensidad moderada o vigorosa. Estos podran ser caminatas dinmicas, ciclismo o gBenin ? Haga ejercicios de elongacin y de fortalecimiento, como yoga o levantamiento de pesas, por lo menos 2veces por semana. ? Reparta la actividad en al menos 3das de la semana.  Haga algn tipo de actividad fsica t[employer] ? No deje pasar ms de 2das seguidos sin hacer algn tipo de actividad fsica. ? No permanezca inactivo durante  ms de 916mutos seguidos. Tmese descansos frecuentes para caminar o estirarse.  Elija un tipo de ejercicio o de actividad que disfrute y establezca objetivos realistas.  Comience lentamente y aumente de maMozambiqueradual la intensidad del ejercicio con el correr del tiShenandoahQU DEBO SABER ACERCA DEL CONTROL DE LA DIABETES?  Contrlese la glucemia antes y despus de ejercitarse. ? Si la glucemia supera los 24048ml (13,3mm84ml) antes de ejercitarse, hgase un control de la orina para detectar la presencia de cetonas. Si tiene cetoEmerson Electricna, no se ejercite hasta que la glucemia se normalice.  Conozca los sntomas de la glucemia baja (hipoglucemia) y aprenda cmo tratarla. El riesgo de tener hipoglucemia aumeSerbiaante y despus de hacer actividad fsica. Los sntomas frecuentes de hipoglucemia pueden incluir los siguientes: ? Hambre. ? Ansiedad. ? Sudoracin y pielIntel CorporationConfusin. ? Mareos o sensacin de desvanecimiento. ? Aumento de la frecuencia cardaca o palpitaciones. ? Visin borrosa. ? Hormigueo o adormecimiento alrededor de lExxon Mobil Corporations labios o la lengPerkinsTemblores o sacudidas. ? Irritabilidad.  Tenga una colacin de carbohidratos de accin rpida disponible antes, durante y despus de ejercitarse, a fin de evitar o tratar la hipoglucemia.  Evite inyectarse insulina en las zonas del cuerpo que ejercitar. Por ejemplo, evite inyectarse insulina en: ? Los brazos, si juega al tenis. ? Las piernas, si corre.  Lleve registros de sus hbitos de actividad fsica. Esto puede ayudarlos a usted y al mdico a adapTax advisercontrol de la diabetes segn sea necesario. Escriba los siguientes datos: ? Los alimentos que consume antes y despus de haceField seismologistividad fsica. ? Los niveles de glucosa en  Esto puede ayudarlos a usted y al mdico a adaptar el plan de control de la diabetes segn sea necesario. Escriba los siguientes datos:  ? Los alimentos que consume antes y despus de hacer actividad fsica.  ? Los niveles de glucosa en la sangre antes y despus de hacer ejercicios.  ? El tipo y cantidad de actividad fsica que realiza.  ? Cuando se prev que la insulina alcance su valor mximo, si usa insulina.  No haga actividad fsica en los momentos en que insulina alcanza su valor mximo.   Cuando comience un ejercicio o una actividad nuevos, trabaje con el mdico para asegurarse de que la actividad sea segura para usted y para ajustar la insulina, los medicamentos o la ingesta de alimentos segn sea necesario.   Beba gran cantidad de agua mientras hace ejercicios para evitar la deshidratacin o los golpes de calor. Beba suficiente lquido para mantener la orina clara o de color amarillo plido.  Esta informacin no tiene como fin reemplazar el consejo del mdico. Asegrese de hacerle al mdico cualquier pregunta que tenga.  Document Released: 01/28/2007 Document Revised: 05/02/2015 Document Reviewed: 06/20/2015  Elsevier Interactive Patient Education  2018 Elsevier Inc.

## 2017-11-09 LAB — BASIC METABOLIC PANEL
BUN / CREAT RATIO: 10 (ref 9–23)
BUN: 6 mg/dL (ref 6–20)
CHLORIDE: 104 mmol/L (ref 96–106)
CO2: 21 mmol/L (ref 20–29)
Calcium: 9.1 mg/dL (ref 8.7–10.2)
Creatinine, Ser: 0.61 mg/dL (ref 0.57–1.00)
GFR calc non Af Amer: 118 mL/min/{1.73_m2} (ref >59)
GFR, EST AFRICAN AMERICAN: 136 mL/min/{1.73_m2} (ref >59)
Glucose: 231 mg/dL — ABNORMAL HIGH (ref 65–99)
POTASSIUM: 4.2 mmol/L (ref 3.5–5.2)
SODIUM: 140 mmol/L (ref 134–144)

## 2017-11-09 LAB — LIPID PANEL
Chol/HDL Ratio: 4.8 ratio — ABNORMAL HIGH (ref 0.0–4.4)
Cholesterol, Total: 149 mg/dL (ref 100–199)
HDL: 31 mg/dL — AB (ref >39)
LDL CALC: 80 mg/dL (ref 0–99)
TRIGLYCERIDES: 189 mg/dL — AB (ref 0–149)
VLDL CHOLESTEROL CAL: 38 mg/dL (ref 5–40)

## 2017-11-09 LAB — DHEA-SULFATE: DHEA-SO4: 131 ug/dL (ref 57.3–279.2)

## 2017-11-09 LAB — THYROID PANEL WITH TSH
Free Thyroxine Index: 2.7 (ref 1.2–4.9)
T3 Uptake Ratio: 27 % (ref 24–39)
T4, Total: 9.9 ug/dL (ref 4.5–12.0)
TSH: 0.923 u[IU]/mL (ref 0.450–4.500)

## 2017-11-09 LAB — FSH/LH
FSH: 6.9 m[IU]/mL
LH: 6.1 m[IU]/mL

## 2017-11-12 ENCOUNTER — Telehealth (INDEPENDENT_AMBULATORY_CARE_PROVIDER_SITE_OTHER): Payer: Self-pay

## 2017-11-12 ENCOUNTER — Encounter (INDEPENDENT_AMBULATORY_CARE_PROVIDER_SITE_OTHER): Payer: Self-pay

## 2017-11-12 NOTE — Telephone Encounter (Signed)
-----   Message from Clent Demark, PA-C sent at 11/11/2017  6:13 PM EDT ----- Normal hormones. Triglycerides elevated. Buy OTC omega 3 fish oil pills and abstain from sweets. Awaiting [place] results.

## 2017-11-12 NOTE — Telephone Encounter (Signed)
Call placed using pacific interpreter [phone]) left voicemail asking patient to return call to [facility] at [phone]. Results mailed. Nat Christen, CMA

## 2017-11-14 ENCOUNTER — Ambulatory Visit (HOSPITAL_COMMUNITY): Admission: RE | Admit: 2017-11-14 | Payer: Self-pay | Source: Ambulatory Visit

## 2017-11-19 MED FILL — ?LEVOTHYROXINE 150 MCG TAB: 150 | 30 days supply | Qty: 30 | Fill #3

## 2017-11-19 MED FILL — metFORMIN HCL 1000 MG TABS: 1000 | 30 days supply | Qty: 60 | Fill #0

## 2017-11-19 MED FILL — ATORVASTATIN CALCIUM 40 MG: 40 | 30 days supply | Qty: 60 | Fill #2

## 2017-12-05 ENCOUNTER — Ambulatory Visit (HOSPITAL_COMMUNITY): Payer: Self-pay | Attending: Physician Assistant

## 2017-12-23 MED FILL — ?LEVOTHYROXINE 150 MCG TAB: 150 | 30 days supply | Qty: 30 | Fill #4

## 2017-12-23 MED FILL — metFORMIN HCL 1000 MG TABS: 1000 | 30 days supply | Qty: 60 | Fill #1

## 2017-12-23 MED FILL — $LANTUS 100 UNITS/ML VIAL: 100 | 33 days supply | Qty: 30 | Fill #1

## 2018-01-27 MED FILL — ?LEVOTHYROXINE 150 MCG TAB: 150 | 30 days supply | Qty: 30 | Fill #5

## 2018-02-07 MED FILL — metFORMIN HCL 1000 MG TABS: 1000 | 30 days supply | Qty: 60 | Fill #2

## 2018-02-07 MED FILL — ATORVASTATIN CALCIUM 40 MG: 40 | 30 days supply | Qty: 60 | Fill #3

## 2018-02-07 MED FILL — $LANTUS 100 UNITS/ML VIAL: 100 | 77 days supply | Qty: 70 | Fill #2

## 2018-02-14 ENCOUNTER — Ambulatory Visit (INDEPENDENT_AMBULATORY_CARE_PROVIDER_SITE_OTHER): Payer: Self-pay | Admitting: Physician Assistant

## 2018-02-19 ENCOUNTER — Ambulatory Visit (INDEPENDENT_AMBULATORY_CARE_PROVIDER_SITE_OTHER): Payer: Self-pay | Admitting: Nurse Practitioner

## 2018-02-19 ENCOUNTER — Other Ambulatory Visit: Payer: Self-pay

## 2018-02-19 ENCOUNTER — Encounter (INDEPENDENT_AMBULATORY_CARE_PROVIDER_SITE_OTHER): Payer: Self-pay | Admitting: Nurse Practitioner

## 2018-02-19 VITALS — BP 125/71 | HR 67 | Temp 98.0°F | Ht 63.0 in | Wt 165.0 lb

## 2018-02-19 DIAGNOSIS — E1165 Type 2 diabetes mellitus with hyperglycemia: Secondary | ICD-10-CM

## 2018-02-19 DIAGNOSIS — E1142 Type 2 diabetes mellitus with diabetic polyneuropathy: Secondary | ICD-10-CM

## 2018-02-19 DIAGNOSIS — Z794 Long term (current) use of insulin: Secondary | ICD-10-CM

## 2018-02-19 LAB — POCT GLYCOSYLATED HEMOGLOBIN (HGB A1C): HEMOGLOBIN A1C: 9.3 % — AB (ref 4.0–5.6)

## 2018-02-19 MED ORDER — GLUCOSE BLOOD VI STRP: ORAL_STRIP | 12 refills | Status: DC

## 2018-02-19 MED ORDER — TRUE METRIX METER W/DEVICE KIT: PACK | 0 refills | Status: DC

## 2018-02-19 MED ORDER — TRUEPLUS LANCETS 28G MISC: 3 refills | Status: DC

## 2018-02-19 MED FILL — TRUE METRIX TEST STRIP: 50 days supply | Qty: 100 | Fill #0

## 2018-02-19 MED FILL — TRUEplus LANCETS 28G MISC: 50 days supply | Qty: 100 | Fill #0

## 2018-02-19 MED FILL — !TRUE METRIX BLOOD GLUCOSE: 365 days supply | Qty: 1 | Fill #0

## 2018-02-19 NOTE — Patient Instructions (Signed)
Registro diario de la diabetes Daily Diabetes Record Introduccin Controle su glucemia segn las indicaciones de su mdico. Utilice este formulario para registrar los [place] de su glucemia y cualquier medicamento para la diabetes que tome, incluyendo insulina. [place] al [employer] un registro de los [place] de su glucemia y Ardelia Mems lista de los medicamentos que est tomando es muy til para el control de la diabetes. Estos nmeros ayudan al mdico a saber si se necesita algn cambio en su plan de control de la diabetes. Nombre del paciente: ____________________________________ Audrey Mcmillan de ____________________ Resultados diarios de la glucemia y medicamentos para la diabetes Fecha: _________  Audrey Mcmillan de glucemia / Medicamentos: ________________ / __________________________________________________________  Audrey Mcmillan de glucemia / Medicamentos: ____________________ / __________________________________________________________  Audrey Mcmillan de glucemia / Medicamentos: ___________________ / __________________________________________________________  Audrey Mcmillan ir a dormir, Nivel de glucemia / Medicamentos: __________________ / __________________________________________________________ Audrey Mcmillan: _________  Audrey Mcmillan de glucemia / Medicamentos: ________________ / __________________________________________________________  Audrey Mcmillan de glucemia / Medicamentos: ____________________ / __________________________________________________________  Audrey Mcmillan de glucemia / Medicamentos: ___________________ / __________________________________________________________  Audrey Mcmillan ir a dormir, Nivel de glucemia / Medicamentos: __________________ / __________________________________________________________ Audrey Mcmillan: _________  Audrey Mcmillan de glucemia / Medicamentos: ________________ / __________________________________________________________  Audrey Mcmillan de glucemia / Medicamentos:  ____________________ / __________________________________________________________  Audrey Mcmillan de glucemia / Medicamentos: ___________________ / __________________________________________________________  Audrey Mcmillan ir a dormir, Nivel de glucemia / Medicamentos: __________________ / __________________________________________________________ Audrey Mcmillan: _________  Audrey Mcmillan de glucemia / Medicamentos: ________________ / __________________________________________________________  Audrey Mcmillan de glucemia / Medicamentos: ____________________ / __________________________________________________________  Audrey Mcmillan de glucemia / Medicamentos: ___________________ / __________________________________________________________  Audrey Mcmillan ir a dormir, Nivel de glucemia / Medicamentos: __________________ / __________________________________________________________ Audrey Mcmillan: _________  Audrey Mcmillan de glucemia / Medicamentos: ________________ / __________________________________________________________  Audrey Mcmillan de glucemia / Medicamentos: ____________________ / __________________________________________________________  Audrey Mcmillan de glucemia / Medicamentos: ___________________ / __________________________________________________________  Audrey Mcmillan ir a dormir, Nivel de glucemia / Medicamentos: __________________ / __________________________________________________________ Audrey Mcmillan: _________  Audrey Mcmillan de glucemia / Medicamentos: ________________ / __________________________________________________________  Audrey Mcmillan de glucemia / Medicamentos: ____________________ / __________________________________________________________  Audrey Mcmillan de glucemia / Medicamentos: ___________________ / __________________________________________________________  Audrey Mcmillan ir a dormir, Nivel de glucemia / Medicamentos: __________________ /  __________________________________________________________ Audrey Mcmillan: _________  Audrey Mcmillan de glucemia / Medicamentos: ________________ / __________________________________________________________  Audrey Mcmillan de glucemia / Medicamentos: ____________________ / __________________________________________________________  Audrey Mcmillan de glucemia / Medicamentos: ___________________ / __________________________________________________________  Audrey Mcmillan ir a dormir, Nivel de glucemia / Medicamentos: __________________ / __________________________________________________________ Audrey Mcmillan: __________________________________________________________________________________________________ Esta informacin no tiene como fin reemplazar el consejo del mdico. Asegrese de hacerle al mdico cualquier pregunta que tenga. Document Released: 12/22/2007 Document Revised: 07/23/2016 Document Reviewed: 07/05/2015 Elsevier Interactive Patient Education  2019 [employer].

## 2018-02-19 NOTE — Progress Notes (Signed)
Assessment & Plan:  Nishtha was seen today for follow-up.  Diagnoses and all orders for this visit:  Type 2 diabetes mellitus with diabetic polyneuropathy, with long-term current use of insulin (HCC) -     HgB A1c -     Blood Glucose Monitoring Suppl (TRUE METRIX METER) w/Device KIT; Use as instructed -     glucose blood (TRUE METRIX BLOOD GLUCOSE TEST) test strip; Use as instructed. Check blood glucose levels twice per day. -     TRUEPLUS LANCETS 28G MISC; Use as instructed. Check blood glucose levels twice per day. Continue blood sugar control as discussed in office today, low carbohydrate diet, and regular physical exercise as tolerated, 150 minutes per week (30 min each day, 5 days per week, or 50 min 3 days per week). Keep blood sugar logs with fasting goal of 90-130 mg/dl, post prandial (after you eat) less than 180.  For Hypoglycemia: BS <60 and Hyperglycemia BS >400; contact the clinic ASAP. Annual eye exams and foot exams are recommended.  Diabetes is poorly controlled. Advised patient to keep a fasting blood sugar log fast, 2 hours post lunch and bedtime which will be reviewed at the next office visit.   Patient has been counseled on age-appropriate routine health concerns for screening and prevention. These are reviewed and up-to-date. Referrals have been placed accordingly. Immunizations are up-to-date or declined.    Subjective:   Chief Complaint  Patient presents with  . Follow-up    DM   HPI Taralee Marcus 38 y.o. female presents to office today for follow up to DM. She is not very engaged in the office visit today. Endorses increased stress dealing with her 70 some year old son who is an alcoholic. She denies any violence in the home.   Diabetes Mellitus Type 2 Chronic and not well controlled. A1c still above goal >7. She is not checking her blood glucose levels as instructed. She does not take her medication as prescribed and se does not exercise. She denies  any hypo or hyperglycemic symptoms. Current medications include: lantus 60units in the am and 30units at night along with metformin 1064m BID. She is overdue for eye exam. Patient has been advised to apply for financial assistance and schedule to see our financial counselor. We discussed dietary and exercise modifications today along with stress reduction. PHQ is normal.  Lab Results  Component Value Date   HGBA1C 9.3 (A) 02/19/2018    [facility] 02/19/2018 11/08/2017 08/08/2017 06/27/2017 03/27/2017  Decreased Interest 1 0 0 0 0  Down, Depressed, Hopeless 0 0 0 0 0  PHQ - 2 Score 1 0 0 0 0  Altered sleeping - - - - 0  Tired, decreased energy - - - - 1  Change in appetite - - - - 0  Feeling bad or failure about yourself  - - - - 0  Trouble concentrating - - - - 0  Moving slowly or fidgety/restless - - - - 0  Suicidal thoughts - - - - 0  PHQ-9 Score - - - - 1   Review of Systems  Constitutional: Negative for fever, malaise/fatigue and weight loss.  HENT: Negative.  Negative for nosebleeds.   Eyes: Negative.  Negative for blurred vision, double vision and photophobia.  Respiratory: Negative.  Negative for cough and shortness of breath.   Cardiovascular: Negative.  Negative for chest pain, palpitations and leg swelling.  Gastrointestinal: Negative.  Negative for heartburn, nausea and vomiting.  Musculoskeletal: Negative.  Negative for myalgias.  Neurological: Negative.  Negative for dizziness, focal weakness, seizures and headaches.  Psychiatric/Behavioral: Negative.  Negative for suicidal ideas.    Past Medical History:  Diagnosis Date  . Diabetes mellitus without complication ([place])   . High cholesterol     History reviewed. No pertinent surgical history.  History reviewed. No pertinent family history.  Social History Reviewed with no changes to be made today.   Outpatient Medications Prior to Visit  Medication Sig Dispense Refill  . atorvastatin (LIPITOR) 40 MG  tablet Take 2 tablets (80 mg total) by mouth daily. 60 tablet 11  . insulin glargine (LANTUS) 100 UNIT/ML injection Inject 60 units at bedtime and 30 units in the morning. 30 mL 11  . Insulin Syringe-Needle U-100 (INSULIN SYRINGE 1CC/30GX5/16") 30G X 5/16" 1 ML MISC Inject 1 application into the skin 2 times daily at 12 noon and 4 pm. 100 each 5  . levothyroxine (SYNTHROID, LEVOTHROID) 150 MCG tablet Take 1 tablet (150 mcg total) by mouth daily before breakfast. 30 tablet 11  . metFORMIN (GLUCOPHAGE) 1000 MG tablet Take 1 tablet (1,000 mg total) by mouth 2 (two) times daily with a meal. 60 tablet 11  . sitaGLIPtin (JANUVIA) 100 MG tablet Take 1 tablet (100 mg total) by mouth daily. (Patient not taking: Reported on 02/19/2018) 30 tablet 11   No facility-administered medications prior to visit.     No Known Allergies     Objective:    BP 125/71 (BP Location: Left Arm, Patient Position: Sitting, Cuff Size: Normal)   Pulse 67   Temp 98 F (36.7 C) (Oral)   Ht _0  (1.6 m)   Wt 165 lb (74.8 kg)   LMP 01/11/2018 (Approximate)   SpO2 100%   BMI 29.23 kg/m  Wt Readings from Last 3 Encounters:  02/19/18 165 lb (74.8 kg)  11/08/17 161 lb 9.6 oz (73.3 kg)  08/08/17 162 lb (73.5 kg)    Physical Exam Vitals signs and nursing note reviewed.  Constitutional:      Appearance: She is well-developed.  HENT:     Head: Normocephalic and atraumatic.  Neck:     Musculoskeletal: Normal range of motion.  Cardiovascular:     Rate and Rhythm: Normal rate and regular rhythm.     Heart sounds: Normal heart sounds. No murmur. No friction rub. No gallop.   Pulmonary:     Effort: Pulmonary effort is normal. No tachypnea or respiratory distress.     Breath sounds: Normal breath sounds. No decreased breath sounds, wheezing, rhonchi or rales.  Chest:     Chest wall: No tenderness.  Abdominal:     General: Bowel sounds are normal.     Palpations: Abdomen is soft.  Musculoskeletal: Normal range of  motion.  Skin:    General: Skin is warm and dry.  Neurological:     Mental Status: She is alert and oriented to person, place, and time.     Coordination: Coordination normal.  Psychiatric:        Behavior: Behavior normal. Behavior is cooperative.        Thought Content: Thought content normal.        Judgment: Judgment normal.          Patient has been counseled extensively about nutrition and exercise as well as the importance of adherence with medications and regular follow-up. The patient was given clear instructions to go to ER or return to medical center if symptoms don't improve,  worsen or new problems develop. The patient verbalized understanding.   Follow-up: Return in about 4 weeks (around 03/19/2018) for WITH LUKE bring meter; food log and glucose readings.   Gildardo Pounds, FNP-BC [facility] and [place], [place]   02/20/2018, 12:13 PM

## 2018-02-20 ENCOUNTER — Encounter (INDEPENDENT_AMBULATORY_CARE_PROVIDER_SITE_OTHER): Payer: Self-pay | Admitting: Nurse Practitioner

## 2018-02-20 ENCOUNTER — Ambulatory Visit: Payer: Self-pay | Attending: Family Medicine | Admitting: Pharmacist

## 2018-02-20 DIAGNOSIS — Z79899 Other long term (current) drug therapy: Secondary | ICD-10-CM

## 2018-02-20 DIAGNOSIS — Z794 Long term (current) use of insulin: Secondary | ICD-10-CM | POA: Insufficient documentation

## 2018-02-20 DIAGNOSIS — E1142 Type 2 diabetes mellitus with diabetic polyneuropathy: Secondary | ICD-10-CM | POA: Insufficient documentation

## 2018-02-20 NOTE — Progress Notes (Signed)
Patient was educated on the use of the True Metrix blood glucose meter. Reviewed necessary supplies and operation of the meter. Also reviewed goal blood glucose levels. Patient was able to demonstrate use. All questions and concerns were addressed.  Patient seen with: Beckey Rutter, PharmD Candidate  [place] [employer]  Class of 2022  Benard Halsted, PharmD, [place] [phone]

## 2018-02-21 ENCOUNTER — Encounter: Payer: Self-pay | Admitting: Pharmacist

## 2018-03-11 MED FILL — LEVOTHYROXINE 150 MCG TAB: 150 | 30 days supply | Qty: 30 | Fill #6

## 2018-03-31 MED FILL — metFORMIN HCL 1000 MG TABS: 1000 | 30 days supply | Qty: 60 | Fill #3

## 2018-04-10 MED FILL — $LANTUS 100 UNITS/ML VIAL: 100 | 33 days supply | Qty: 30 | Fill #3

## 2018-04-11 MED FILL — LEVOTHYROXINE 150 MCG TAB: 150 | 30 days supply | Qty: 30 | Fill #7

## 2018-05-09 MED FILL — LEVOTHYROXINE 150 MCG TAB: 150 | 30 days supply | Qty: 30 | Fill #8

## 2018-05-09 MED FILL — metFORMIN HCL 1000 MG TABS: 1000 | 30 days supply | Qty: 60 | Fill #4

## 2018-05-09 MED FILL — ATORVASTATIN CALCIUM 40 MG: 40 | 30 days supply | Qty: 60 | Fill #4

## 2018-05-15 ENCOUNTER — Ambulatory Visit (INDEPENDENT_AMBULATORY_CARE_PROVIDER_SITE_OTHER): Payer: Self-pay | Admitting: Primary Care

## 2018-05-15 ENCOUNTER — Ambulatory Visit: Payer: Self-pay | Admitting: Primary Care

## 2018-05-21 ENCOUNTER — Ambulatory Visit: Payer: Self-pay | Admitting: Primary Care

## 2018-06-06 MED FILL — LEVOTHYROXINE 150 MCG TAB: 150 | 30 days supply | Qty: 30 | Fill #9

## 2018-06-07 MED FILL — $LANTUS 100 UNITS/ML VIAL: 100 | 88 days supply | Qty: 80 | Fill #4

## 2018-06-07 MED FILL — ?METFORMIN HCL 1000 MG TAB: 1000 | 30 days supply | Qty: 60 | Fill #5

## 2018-07-11 MED FILL — LEVOTHYROXINE 150 MCG TAB: 150 | 30 days supply | Qty: 30 | Fill #10

## 2018-07-11 MED FILL — metFORMIN HCL 1000 MG TABS: 1000 | 30 days supply | Qty: 60 | Fill #6

## 2018-08-12 ENCOUNTER — Telehealth (INDEPENDENT_AMBULATORY_CARE_PROVIDER_SITE_OTHER): Payer: Self-pay | Admitting: Physician Assistant

## 2018-08-12 NOTE — Telephone Encounter (Signed)
Pt would like refills until her follow up appt-08/21/2018 If possible -atorvastatin (LIPITOR) 40 MG tablet  -glucose blood (TRUE METRIX BLOOD GLUCOSE TEST) test strip  -insulin glargine (LANTUS) 100 UNIT/ML injection  -levothyroxine (SYNTHROID, LEVOTHROID) 150 MCG tablet  -metFORMIN (GLUCOPHAGE) 1000 MG tablet  -TRUEPLUS LANCETS 28G MISC  To -[place], [place]. [address]

## 2018-08-12 NOTE — Telephone Encounter (Signed)
Per PCP patient must present to appointment before refills given. Would you please notify patient. Thanks. Nat Christen, CMA

## 2018-08-13 ENCOUNTER — Encounter (INDEPENDENT_AMBULATORY_CARE_PROVIDER_SITE_OTHER): Payer: Self-pay | Admitting: Primary Care

## 2018-08-13 ENCOUNTER — Ambulatory Visit (INDEPENDENT_AMBULATORY_CARE_PROVIDER_SITE_OTHER): Payer: Self-pay | Admitting: Primary Care

## 2018-08-13 ENCOUNTER — Other Ambulatory Visit: Payer: Self-pay

## 2018-08-13 VITALS — BP 119/72 | HR 80 | Temp 99.3°F | Ht 63.0 in | Wt 169.2 lb

## 2018-08-13 DIAGNOSIS — Z794 Long term (current) use of insulin: Secondary | ICD-10-CM

## 2018-08-13 DIAGNOSIS — Z6829 Body mass index (BMI) 29.0-29.9, adult: Secondary | ICD-10-CM

## 2018-08-13 DIAGNOSIS — E1142 Type 2 diabetes mellitus with diabetic polyneuropathy: Secondary | ICD-10-CM

## 2018-08-13 DIAGNOSIS — E7841 Elevated Lipoprotein(a): Secondary | ICD-10-CM

## 2018-08-13 DIAGNOSIS — E039 Hypothyroidism, unspecified: Secondary | ICD-10-CM

## 2018-08-13 DIAGNOSIS — I10 Essential (primary) hypertension: Secondary | ICD-10-CM

## 2018-08-13 LAB — POCT GLYCOSYLATED HEMOGLOBIN (HGB A1C): Hemoglobin A1C: 8.5 % — AB (ref 4.0–5.6)

## 2018-08-13 LAB — GLUCOSE, POCT (MANUAL RESULT ENTRY): POC Glucose: 208 mg/dl — AB (ref 70–99)

## 2018-08-13 MED ORDER — LISINOPRIL 2.5 MG PO TABS: 2.5000 mg | ORAL_TABLET | Freq: Every day | ORAL | 3 refills | Status: DC

## 2018-08-13 MED ORDER — LEVOTHYROXINE SODIUM 150 MCG PO TABS: 150.0000 ug | ORAL_TABLET | Freq: Every day | ORAL | 0 refills | Status: DC

## 2018-08-13 MED ORDER — INSULIN GLARGINE 100 UNIT/ML ~~LOC~~ SOLN: SUBCUTANEOUS | 3 refills | Status: DC

## 2018-08-13 MED ORDER — ATORVASTATIN CALCIUM 40 MG PO TABS: 80.0000 mg | ORAL_TABLET | Freq: Every day | ORAL | 3 refills | Status: DC

## 2018-08-13 MED ORDER — "INSULIN SYRINGE 30G X 5/16"" 1 ML MISC": 1.0000 "application " | Freq: Two times a day (BID) | 5 refills | Status: DC

## 2018-08-13 MED ORDER — METFORMIN HCL 1000 MG PO TABS: 1000.0000 mg | ORAL_TABLET | Freq: Two times a day (BID) | ORAL | 3 refills | Status: DC

## 2018-08-13 MED FILL — LISINOPRIL 2.5 MG TABLET: 2.5 | 30 days supply | Qty: 30 | Fill #0

## 2018-08-13 MED FILL — metFORMIN HCL 1000 MG TABS: 1000 | 30 days supply | Qty: 60 | Fill #0

## 2018-08-13 MED FILL — ?ATORVASTATIN 40MG TABLET: 40 | 30 days supply | Qty: 60 | Fill #0

## 2018-08-13 MED FILL — LEVOTHYROXINE 150 MCG TAB: 150 | 30 days supply | Qty: 30 | Fill #0

## 2018-08-13 MED FILL — $LANTUS 100 UNITS/ML VIAL: 100 | 33 days supply | Qty: 30 | Fill #5

## 2018-08-13 NOTE — Progress Notes (Signed)
Established Patient Office Visit  Subjective:  Patient ID: Audrey Mcmillan, female    DOB: 1981-01-19  Age: 38 y.o. MRN: [id number]  CC:  Chief Complaint  Patient presents with  . Establish Care    DM  . Medication Refill    all medications need refills     HPI Audrey Mcmillan presents for diabetes management and medication refills.   Past Medical History:  Diagnosis Date  . Diabetes mellitus without complication ([place])   . High cholesterol     History reviewed. No pertinent surgical history.  History reviewed. No pertinent family history.  Social History   Socioeconomic History  . Marital status: Single    Spouse name: Not on file  . Number of children: Not on file  . Years of education: Not on file  . Highest education level: Not on file  Occupational History  . Not on file  Social Needs  . Financial resource strain: Not on file  . Food insecurity    Worry: Not on file    Inability: Not on file  . Transportation needs    Medical: Not on file    Non-medical: Not on file  Tobacco Use  . Smoking status: Never Smoker  . Smokeless tobacco: Never Used  Substance and Sexual Activity  . Alcohol use: No    Frequency: Never    Comment: occ  . Drug use: No  . Sexual activity: Yes    Birth control/protection: None  Lifestyle  . Physical activity    Days per week: Not on file    Minutes per session: Not on file  . Stress: Not on file  Relationships  . Social Herbalist on phone: Not on file    Gets together: Not on file    Attends religious service: Not on file    Active member of club or organization: Not on file    Attends meetings of clubs or organizations: Not on file    Relationship status: Not on file  . Intimate partner violence    Fear of current or ex partner: Not on file    Emotionally abused: Not on file    Physically abused: Not on file    Forced sexual activity: Not on file  Other Topics Concern  . Not on file   Social History Narrative   ** Merged History Encounter **        Outpatient Medications Prior to Visit  Medication Sig Dispense Refill  . Blood Glucose Monitoring Suppl (TRUE METRIX METER) w/Device KIT Use as instructed 1 kit 0  . glucose blood (TRUE METRIX BLOOD GLUCOSE TEST) test strip Use as instructed. Check blood glucose levels twice per day. 100 each 12  . TRUEPLUS LANCETS 28G MISC Use as instructed. Check blood glucose levels twice per day. 100 each 3  . atorvastatin (LIPITOR) 40 MG tablet Take 2 tablets (80 mg total) by mouth daily. 60 tablet 11  . insulin glargine (LANTUS) 100 UNIT/ML injection Inject 60 units at bedtime and 30 units in the morning. 30 mL 11  . Insulin Syringe-Needle U-100 (INSULIN SYRINGE 1CC/30GX5/16") 30G X 5/16" 1 ML MISC Inject 1 application into the skin 2 times daily at 12 noon and 4 pm. 100 each 5  . levothyroxine (SYNTHROID, LEVOTHROID) 150 MCG tablet Take 1 tablet (150 mcg total) by mouth daily before breakfast. 30 tablet 11  . metFORMIN (GLUCOPHAGE) 1000 MG tablet Take 1 tablet (1,000 mg total) by mouth 2 (  two) times daily with a meal. 60 tablet 11   No facility-administered medications prior to visit.     No Known Allergies  ROS Review of Systems  All other systems reviewed and are negative.     Objective:    Physical Exam  Constitutional: She is oriented to person, place, and time. She appears well-developed and well-nourished.  HENT:  Head: Normocephalic.  Eyes: EOM are normal.  Neck: Neck supple.  Cardiovascular: Normal rate and regular rhythm.  Pulmonary/Chest: Effort normal and breath sounds normal.  Abdominal: Soft. Bowel sounds are normal. She exhibits distension.  Neurological: She is oriented to person, place, and time.  Skin: Skin is warm and dry.  Psychiatric: She has a normal mood and affect.  Sensory exam of the foot is normal, tested with the monofilament. Good pulses, no lesions or ulcers, good peripheral pulses.  BP  119/72 (BP Location: Right Arm, Patient Position: Sitting, Cuff Size: Normal)   Pulse 80   Temp 99.3 F (37.4 C) (Tympanic)   Ht 5' 3"  (1.6 m)   Wt 169 lb 3.2 oz (76.7 kg)   LMP 07/30/2018 (Approximate)   SpO2 97%   BMI 29.97 kg/m  Wt Readings from Last 3 Encounters:  08/13/18 169 lb 3.2 oz (76.7 kg)  02/19/18 165 lb (74.8 kg)  11/08/17 161 lb 9.6 oz (73.3 kg)     Health Maintenance Due  Topic Date Due  . OPHTHALMOLOGY EXAM  12/05/1991  . FOOT EXAM  06/28/2018  . URINE MICROALBUMIN  06/28/2018    There are no preventive care reminders to display for this patient.  Lab Results  Component Value Date   TSH 0.923 11/08/2017   Lab Results  Component Value Date   WBC 7.2 02/27/2017   HGB 14.7 02/27/2017   HCT 44.3 02/27/2017   MCV 91 02/27/2017   PLT 301 02/27/2017   Lab Results  Component Value Date   NA 140 11/08/2017   K 4.2 11/08/2017   CO2 21 11/08/2017   GLUCOSE 231 (H) 11/08/2017   BUN 6 11/08/2017   CREATININE 0.61 11/08/2017   BILITOT 0.7 02/27/2017   ALKPHOS 92 02/27/2017   AST 10 02/27/2017   ALT 15 02/27/2017   PROT 7.5 02/27/2017   ALBUMIN 4.5 02/27/2017   CALCIUM 9.1 11/08/2017   ANIONGAP 12 01/16/2015   Lab Results  Component Value Date   CHOL 149 11/08/2017   Lab Results  Component Value Date   HDL 31 (L) 11/08/2017   Lab Results  Component Value Date   LDLCALC 80 11/08/2017   Lab Results  Component Value Date   TRIG 189 (H) 11/08/2017   Lab Results  Component Value Date   CHOLHDL 4.8 (H) 11/08/2017   Lab Results  Component Value Date   HGBA1C 8.5 (A) 08/13/2018      Assessment & Plan:  Kalinda was seen today for establish care and medication refill.  Diagnoses and all orders for this visit:  Type 2 diabetes mellitus with diabetic polyneuropathy, with long-term current use of insulin ([place]) ADA recommends the following therapeutic goals for glycemic control related to A1c measurements: Goal of therapy: Less than 6.5  hemoglobin A1c.  Reference clinical practice recommendations. Foods that are high in carbohydrates are the following rice, potatoes, breads, sugars, and pastas.  Reduction in the intake (eating) will assist in lowering your blood sugars. -     HgB A1c -     Glucose (CBG) -     Ambulatory referral  to Ophthalmology -     HM Diabetes Foot Exam -     metFORMIN (GLUCOPHAGE) 1000 MG tablet; Take 1 tablet (1,000 mg total) by mouth 2 (two) times daily with a meal. -     insulin glargine (LANTUS) 100 UNIT/ML injection; Inject 60 units at bedtime and 30 units in the morning. -     CBC with Differential -     CMP14+EGFR -     Microalbumin, urine  Elevated lipoprotein(a)  Healthy lifestyle diet of fruits vegetables fish nuts whole grains and low saturated fat . Foods high in cholesterol or liver, fatty meats,cheese, butter avocados, nuts and seeds, chocolate and fried foods.    -     atorvastatin (LIPITOR) 40 MG tablet; Take 2 tablets (80 mg total) by mouth daily. -     Lipid Panel  Hypothyroidism, unspecified type -     levothyroxine (SYNTHROID) 150 MCG tablet; Take 1 tablet (150 mcg total) by mouth daily before breakfast. -     TSH + free T4  Adult BMI 29.0-29.9 kg/sq m Obesity is indicating an excess in caloric intake or underlining conditions. This may lead to other co-morbidities. Lifestyle modifications of diet and exercise may reduce obesity.   Essential hypertension Counseled on blood pressure goal of less than 130/80, low-sodium, DASH diet, medication compliance, 150 minutes of moderate intensity exercise per week. Discussed medication compliance, adverse effects.  Other orders -     lisinopril (ZESTRIL) 2.5 MG tablet; Take 1 tablet (2.5 mg total) by mouth daily.    Meds ordered this encounter  Medications  . atorvastatin (LIPITOR) 40 MG tablet    Sig: Take 2 tablets (80 mg total) by mouth daily.    Dispense:  60 tablet    Refill:  3  . levothyroxine (SYNTHROID) 150 MCG tablet     Sig: Take 1 tablet (150 mcg total) by mouth daily before breakfast.    Dispense:  30 tablet    Refill:  0  . metFORMIN (GLUCOPHAGE) 1000 MG tablet    Sig: Take 1 tablet (1,000 mg total) by mouth 2 (two) times daily with a meal.    Dispense:  60 tablet    Refill:  3  . insulin glargine (LANTUS) 100 UNIT/ML injection    Sig: Inject 60 units at bedtime and 30 units in the morning.    Dispense:  30 mL    Refill:  3  . lisinopril (ZESTRIL) 2.5 MG tablet    Sig: Take 1 tablet (2.5 mg total) by mouth daily.    Dispense:  30 tablet    Refill:  3  . Insulin Syringe-Needle U-100 (INSULIN SYRINGE 1CC/30GX5/16") 30G X 5/16" 1 ML MISC    Sig: Inject 1 application into the skin 2 times daily at 12 noon and 4 pm.    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    Kerin Perna, NP

## 2018-08-13 NOTE — Patient Instructions (Signed)
Diabetes mellitus y nutricin, en adultos Diabetes Mellitus and Nutrition, Adult Si sufre de diabetes (diabetes mellitus), es muy importante tener hbitos alimenticios saludables debido a que sus niveles de Designer, television/film set sangre (glucosa) se ven afectados en gran medida por lo que come y bebe. [place] alimentos saludables en las cantidades [place], aproximadamente a la [employer], [place] ayudar a:  Aeronautical engineer glucemia.  Disminuir el riesgo de sufrir una enfermedad cardaca.  Mejorar la presin arterial.  Science writer o mantener un peso saludable. Todas las personas que sufren de diabetes son diferentes y cada una tiene necesidades diferentes en cuanto a un plan de alimentacin. El mdico puede recomendarle que trabaje con un especialista en dietas y nutricin (nutricionista) para Financial trader plan para usted. Su plan de alimentacin puede variar segn factores como:  Las caloras que necesita.  Los medicamentos que toma.  Su peso.  Sus niveles de glucemia, presin arterial y colesterol.  Su nivel de [place].  Otras afecciones que tenga, como enfermedades cardacas o renales. Cmo me afectan los carbohidratos? Los carbohidratos, o hidratos de carbono, afectan su nivel de glucemia ms que cualquier otro tipo de alimento. La ingesta de carbohidratos naturalmente aumenta la cantidad de [employer]. El recuento de carbohidratos es un mtodo destinado a Catering manager un registro de la cantidad de carbohidratos que se consumen. El recuento de carbohidratos es importante para Theatre manager la glucemia a un nivel saludable, especialmente si utiliza insulina o toma determinados medicamentos por va oral para la diabetes. Es importante conocer la cantidad de carbohidratos que se pueden ingerir en cada comida sin correr Engineer, manufacturing. Esto es Psychologist, forensic. Su nutricionista puede ayudarlo a calcular la cantidad de carbohidratos que debe ingerir en cada comida y en cada  refrigerio. Entre los alimentos que contienen carbohidratos, se incluyen:  Pan, cereal, arroz, pastas y galletas.  Papas y maz.  Guisantes, frijoles y lentejas.  Leche y Estate agent.  Lambert Mody y [place].  Postres, como pasteles, galletas, helado y caramelos. Cmo me afecta el alcohol? El alcohol puede provocar disminuciones sbitas de la glucemia (hipoglucemia), especialmente si utiliza insulina o toma determinados medicamentos por va oral para la diabetes. La hipoglucemia es una afeccin potencialmente mortal. Los sntomas de la hipoglucemia (somnolencia, mareos y confusin) son similares a los sntomas de haber consumido demasiado alcohol. Si el mdico afirma que el alcohol es seguro para usted, [place] estas pautas:  Limite el consumo de alcohol a no ms de 26mdida por da si es mujer y no est [place] y a 219midas si es hombre. Una medida equivale a 12oz (35564mde cerveza, 5oz (148m60me vino o 1oz (44ml75m bebidas alcohlicas de alta graduacin.  No beba con el estmago vaco.  Mantngase hidratado bebiendo agua, refrescos dietticos o t helado sin azcar.  Tenga en cuenta que los refrescos comunes, los jugos y otras bebida para mezclOptician, dispensingen contener mucha azcar y se deben contar como carbohidratos. Cules son algunos consejos para seguir este plan?  Leer las etiquetas de los alimentos  Comience por leer el tamao de la porcin en la "Informacin nutricional" en las etiquetas de los alimentos envasados y las bebidas. La cantidad de caloras, carbohidratos, grasas y otros nutrientes mencionados en la etiqueta se basan en una porcin del alimento. Muchos alimentos contienen ms de una porcin por envase.  Verifique la cantidad total de gramos (g) de carbohidratos totales en una porcin. Puede calcular la cantidad de porciones de carbohidratos al dividir el  Muchos alimentos contienen ms de una porcin por envase.   Verifique la cantidad total de gramos (g) de carbohidratos totales en una porcin. Puede calcular la cantidad de porciones de carbohidratos al dividir el total de carbohidratos por 15. Por ejemplo, si un alimento tiene un total de 30g de carbohidratos, equivale a 2  porciones de carbohidratos.   Verifique la cantidad de gramos (g) de grasas saturadas y grasas trans en una porcin. Escoja alimentos que no contengan grasa o que tengan un bajo contenido.   Verifique la cantidad de miligramos (mg) de sal (sodio) en una porcin. La mayora de las personas deben limitar la ingesta de sodio total a menos de 2300mg por da.   Siempre consulte la informacin nutricional de los alimentos etiquetados como "con bajo contenido de grasa" o "sin grasa". Estos alimentos pueden tener un mayor contenido de azcar agregada o carbohidratos refinados, y deben evitarse.   Hable con su nutricionista para identificar sus objetivos diarios en cuanto a los nutrientes mencionados en la etiqueta.  Al ir de compras   Evite comprar alimentos procesados, enlatados o precocinados. Estos alimentos tienden a tener una mayor cantidad de grasa, sodio y azcar agregada.   Compre en la zona exterior de la tienda de comestibles. Esta zona incluye frutas y verduras frescas, granos a granel, carnes frescas y productos lcteos frescos.  Al cocinar   Utilice mtodos de coccin a baja temperatura, como hornear, en lugar de mtodos de coccin a alta temperatura, como frer en abundante aceite.   Cocine con aceites saludables, como el aceite de oliva, canola o girasol.   Evite cocinar con manteca, crema o carnes con alto contenido de grasa.  Planificacin de las comidas   Coma las comidas y los refrigerios regularmente, preferentemente a la misma hora todos los das. Evite pasar largos perodos de tiempo sin comer.   Consuma alimentos ricos en fibra, como frutas frescas, verduras, frijoles y cereales integrales. Consulte a su nutricionista sobre cuntas porciones de carbohidratos puede consumir en cada comida.   Consuma entre 4 y 6 onzas (oz) de protenas magras por da, como carnes magras, pollo, pescado, huevos o tofu. Una onza de protena magra equivale a:  ? 1 onza de carne, pollo o  pescado.  ? 1huevo.  ?  taza de tofu.   Coma algunos alimentos por da que contengan grasas saludables, como aguacates, frutos secos, semillas y pescado.  Estilo de vida   Controle su nivel de glucemia con regularidad.   Haga actividad fsica habitualmente como se lo haya indicado el mdico. Esto puede incluir lo siguiente:  ? 150minutos semanales de ejercicio de intensidad moderada o alta. Esto podra incluir caminatas dinmicas, ciclismo o gimnasia acutica.  ? Realizar ejercicios de elongacin y de fortalecimiento, como yoga o levantamiento de pesas, por lo menos 2veces por semana.   Tome los medicamentos como se lo haya indicado el mdico.   No consuma ningn producto que contenga nicotina o tabaco, como cigarrillos y cigarrillos electrnicos. Si necesita ayuda para dejar de fumar, consulte al mdico.   Trabaje con un asesor o instructor en diabetes para identificar estrategias para controlar el estrs y cualquier desafo emocional y social.  Preguntas para hacerle al mdico   Es necesario que consulte a un instructor en el cuidado de la diabetes?   Es necesario que me rena con un nutricionista?   A qu nmero puedo llamar si tengo preguntas?   Cules son los mejores momentos para controlar la glucemia?  Dnde   Trabajar con un especialista en dietas y nutricin (nutricionista) puede ayudarlo a Insurance claims handler de alimentacin para usted.  Tenga en cuenta que los carbohidratos (hidratos de carbono) y el alcohol tienen  efectos inmediatos en sus niveles de glucemia. Es importante contar los carbohidratos que ingiere y consumir alcohol con prudencia. Esta informacin no tiene Marine scientist el consejo del mdico. Asegrese de hacerle al mdico cualquier pregunta que tenga. Document Released: 04/17/2007 Document Revised: 09/18/2016 Document Reviewed: 04/30/2016 Elsevier Patient Education  2020 [employer].

## 2018-08-14 LAB — TSH+FREE T4
Free T4: 1.48 ng/dL (ref 0.82–1.77)
TSH: 0.612 u[IU]/mL (ref 0.450–4.500)

## 2018-08-14 LAB — CMP14+EGFR
ALT: 22 IU/L (ref 0–32)
AST: 12 IU/L (ref 0–40)
Albumin/Globulin Ratio: 1.6 (ref 1.2–2.2)
Albumin: 4 g/dL (ref 3.8–4.8)
Alkaline Phosphatase: 88 IU/L (ref 39–117)
BUN/Creatinine Ratio: 7 — ABNORMAL LOW (ref 9–23)
BUN: 4 mg/dL — ABNORMAL LOW (ref 6–20)
Bilirubin Total: 0.3 mg/dL (ref 0.0–1.2)
CO2: 26 mmol/L (ref 20–29)
Calcium: 9.3 mg/dL (ref 8.7–10.2)
Chloride: 103 mmol/L (ref 96–106)
Creatinine, Ser: 0.58 mg/dL (ref 0.57–1.00)
GFR calc Af Amer: 137 mL/min/{1.73_m2} (ref >59)
GFR calc non Af Amer: 119 mL/min/{1.73_m2} (ref >59)
Globulin, Total: 2.5 g/dL (ref 1.5–4.5)
Glucose: 189 mg/dL — ABNORMAL HIGH (ref 65–99)
Potassium: 4.5 mmol/L (ref 3.5–5.2)
Sodium: 142 mmol/L (ref 134–144)
Total Protein: 6.5 g/dL (ref 6.0–8.5)

## 2018-08-14 LAB — CBC WITH DIFFERENTIAL/PLATELET
Basophils Absolute: 0.1 10*3/uL (ref 0.0–0.2)
Basos: 1 %
EOS (ABSOLUTE): 0.2 10*3/uL (ref 0.0–0.4)
Eos: 2 %
Hematocrit: 38 % (ref 34.0–46.6)
Hemoglobin: 12.9 g/dL (ref 11.1–15.9)
Immature Grans (Abs): 0 10*3/uL (ref 0.0–0.1)
Immature Granulocytes: 0 %
Lymphocytes Absolute: 4.5 10*3/uL — ABNORMAL HIGH (ref 0.7–3.1)
Lymphs: 40 %
MCH: 29.9 pg (ref 26.6–33.0)
MCHC: 33.9 g/dL (ref 31.5–35.7)
MCV: 88 fL (ref 79–97)
Monocytes Absolute: 0.5 10*3/uL (ref 0.1–0.9)
Monocytes: 4 %
Neutrophils Absolute: 5.9 10*3/uL (ref 1.4–7.0)
Neutrophils: 53 %
Platelets: 273 10*3/uL (ref 150–450)
RBC: 4.32 x10E6/uL (ref 3.77–5.28)
RDW: 13.1 % (ref 11.7–15.4)
WBC: 11.1 10*3/uL — ABNORMAL HIGH (ref 3.4–10.8)

## 2018-08-14 LAB — LIPID PANEL
Chol/HDL Ratio: 5.4 ratio — ABNORMAL HIGH (ref 0.0–4.4)
Cholesterol, Total: 157 mg/dL (ref 100–199)
HDL: 29 mg/dL — ABNORMAL LOW (ref >39)
LDL Calculated: 55 mg/dL (ref 0–99)
Triglycerides: 364 mg/dL — ABNORMAL HIGH (ref 0–149)
VLDL Cholesterol Cal: 73 mg/dL — ABNORMAL HIGH (ref 5–40)

## 2018-08-14 LAB — MICROALBUMIN, URINE: Microalbumin, Urine: 5.4 ug/mL

## 2018-08-21 ENCOUNTER — Ambulatory Visit (INDEPENDENT_AMBULATORY_CARE_PROVIDER_SITE_OTHER): Payer: Self-pay | Admitting: Primary Care

## 2018-08-29 ENCOUNTER — Encounter (INDEPENDENT_AMBULATORY_CARE_PROVIDER_SITE_OTHER): Payer: Self-pay

## 2018-09-04 MED FILL — PROMETHAZINE W/DM SYRUP: 6.25-15 | 4 days supply | Qty: 80 | Fill #0

## 2018-09-15 ENCOUNTER — Other Ambulatory Visit: Payer: Self-pay

## 2018-09-15 ENCOUNTER — Telehealth (INDEPENDENT_AMBULATORY_CARE_PROVIDER_SITE_OTHER): Payer: Self-pay

## 2018-09-15 DIAGNOSIS — E039 Hypothyroidism, unspecified: Secondary | ICD-10-CM

## 2018-09-15 DIAGNOSIS — Z20822 Contact with and (suspected) exposure to covid-19: Secondary | ICD-10-CM

## 2018-09-15 MED ORDER — LEVOTHYROXINE SODIUM 150 MCG PO TABS: 150.0000 ug | ORAL_TABLET | Freq: Every day | ORAL | 1 refills | Status: DC

## 2018-09-15 MED FILL — $LANTUS 100 UNITS/ML VIAL: 100 | 33 days supply | Qty: 30 | Fill #6

## 2018-09-15 MED FILL — LEVOTHYROXINE 150 MCG TAB: 150 | 30 days supply | Qty: 30 | Fill #0

## 2018-09-15 NOTE — Telephone Encounter (Signed)
Patient called to request medication refill for levothyroxine (SYNTHROID) 150 MCG tablet    Patient uses [facility]   Please advice [phone]    Thank you Whitney Post

## 2018-09-16 LAB — NOVEL CORONAVIRUS, NAA: SARS-CoV-2, NAA: NOT DETECTED

## 2018-10-02 MED FILL — metFORMIN HCL 1000 MG TABS: 1000 | 30 days supply | Qty: 60 | Fill #1

## 2018-10-02 MED FILL — ?ATORVASTATIN 40MG TABLET: 40 | 30 days supply | Qty: 60 | Fill #1

## 2018-10-22 MED FILL — LEVOTHYROXINE 150 MCG TAB: 150 | 30 days supply | Qty: 30 | Fill #1

## 2018-11-10 MED FILL — metFORMIN HCL 1000 MG TABS: 1000 | 30 days supply | Qty: 60 | Fill #2

## 2018-11-14 ENCOUNTER — Ambulatory Visit (INDEPENDENT_AMBULATORY_CARE_PROVIDER_SITE_OTHER): Payer: Self-pay | Admitting: Primary Care

## 2018-11-28 ENCOUNTER — Ambulatory Visit (INDEPENDENT_AMBULATORY_CARE_PROVIDER_SITE_OTHER): Payer: Self-pay | Admitting: Primary Care

## 2018-11-28 ENCOUNTER — Other Ambulatory Visit: Payer: Self-pay

## 2018-11-28 ENCOUNTER — Encounter (INDEPENDENT_AMBULATORY_CARE_PROVIDER_SITE_OTHER): Payer: Self-pay | Admitting: Primary Care

## 2018-11-28 VITALS — BP 101/65 | HR 76 | Temp 97.3°F | Ht 63.0 in | Wt 167.2 lb

## 2018-11-28 DIAGNOSIS — Z794 Long term (current) use of insulin: Secondary | ICD-10-CM

## 2018-11-28 DIAGNOSIS — E039 Hypothyroidism, unspecified: Secondary | ICD-10-CM

## 2018-11-28 DIAGNOSIS — E1142 Type 2 diabetes mellitus with diabetic polyneuropathy: Secondary | ICD-10-CM

## 2018-11-28 DIAGNOSIS — K5900 Constipation, unspecified: Secondary | ICD-10-CM

## 2018-11-28 DIAGNOSIS — E7841 Elevated Lipoprotein(a): Secondary | ICD-10-CM

## 2018-11-28 LAB — POCT GLYCOSYLATED HEMOGLOBIN (HGB A1C): Hemoglobin A1C: 8.3 % — AB (ref 4.0–5.6)

## 2018-11-28 LAB — GLUCOSE, POCT (MANUAL RESULT ENTRY): POC Glucose: 78 mg/dl (ref 70–99)

## 2018-11-28 MED ORDER — ATORVASTATIN CALCIUM 40 MG PO TABS: 80.0000 mg | ORAL_TABLET | Freq: Every day | ORAL | 3 refills | Status: DC

## 2018-11-28 MED ORDER — INSULIN GLARGINE 100 UNIT/ML ~~LOC~~ SOLN: SUBCUTANEOUS | 3 refills | Status: DC

## 2018-11-28 MED ORDER — METFORMIN HCL 1000 MG PO TABS: 1000.0000 mg | ORAL_TABLET | Freq: Two times a day (BID) | ORAL | 3 refills | Status: DC

## 2018-11-28 MED ORDER — LISINOPRIL 2.5 MG PO TABS: 2.5000 mg | ORAL_TABLET | Freq: Every day | ORAL | 3 refills | Status: DC

## 2018-11-28 MED ORDER — LEVOTHYROXINE SODIUM 150 MCG PO TABS: 150.0000 ug | ORAL_TABLET | Freq: Every day | ORAL | 1 refills | Status: DC

## 2018-11-28 MED ORDER — GLIPIZIDE 10 MG PO TABS: 10.0000 mg | ORAL_TABLET | Freq: Two times a day (BID) | ORAL | 3 refills | Status: DC

## 2018-11-28 MED ORDER — TRUEPLUS LANCETS 28G MISC: 3 refills | Status: DC

## 2018-11-28 MED ORDER — "INSULIN SYRINGE 30G X 5/16"" 1 ML MISC": 1.0000 "application " | Freq: Two times a day (BID) | 5 refills | Status: DC

## 2018-11-28 MED ORDER — SENNA 8.6 MG PO TABS: 1.0000 | ORAL_TABLET | Freq: Every day | ORAL | 0 refills | Status: DC

## 2018-11-28 MED FILL — LISINOPRIL 2.5 MG TABLET: 2.5 | 30 days supply | Qty: 30 | Fill #0

## 2018-11-28 MED FILL — ?ATORVASTATIN 40MG TABLET: 40 | 30 days supply | Qty: 60 | Fill #0

## 2018-11-28 MED FILL — TRUEPLUS SYR 0.5ML 30GX5/16: 30G X 5/16" | 30 days supply | Qty: 100 | Fill #0

## 2018-11-28 MED FILL — TRUEplus LANCETS 28G MISC: 50 days supply | Qty: 100 | Fill #0

## 2018-11-28 MED FILL — glipiZIDE 10 MG TABS: 10 | 30 days supply | Qty: 60 | Fill #0

## 2018-11-28 MED FILL — LEVOTHYROXINE 150 MCG TAB: 150 | 30 days supply | Qty: 30 | Fill #0

## 2018-11-28 MED FILL — $LANTUS 100 UNITS/ML VIAL: 100 | 30 days supply | Qty: 30 | Fill #0

## 2018-11-28 NOTE — Progress Notes (Signed)
Established Patient Office Visit  Subjective:  Patient ID: Audrey Mcmillan, female    DOB: Nov 11, 1980  Age: 38 y.o. MRN: [id number]  CC:  Chief Complaint  Patient presents with  . Diabetes    HPI Audrey Mcmillan presents for the management of diabetes, hypothyrodism and elevated cholesterol. Denies any concerns does have a problem with constipation and would like to address.  Past Medical History:  Diagnosis Date  . Diabetes mellitus without complication ([place])   . High cholesterol     History reviewed. No pertinent surgical history.  History reviewed. No pertinent family history.  Social History   Socioeconomic History  . Marital status: Single    Spouse name: Not on file  . Number of children: Not on file  . Years of education: Not on file  . Highest education level: Not on file  Occupational History  . Not on file  Social Needs  . Financial resource strain: Not on file  . Food insecurity    Worry: Not on file    Inability: Not on file  . Transportation needs    Medical: Not on file    Non-medical: Not on file  Tobacco Use  . Smoking status: Never Smoker  . Smokeless tobacco: Never Used  Substance and Sexual Activity  . Alcohol use: No    Frequency: Never    Comment: occ  . Drug use: No  . Sexual activity: Yes    Birth control/protection: None  Lifestyle  . Physical activity    Days per week: Not on file    Minutes per session: Not on file  . Stress: Not on file  Relationships  . Social Herbalist on phone: Not on file    Gets together: Not on file    Attends religious service: Not on file    Active member of club or organization: Not on file    Attends meetings of clubs or organizations: Not on file    Relationship status: Not on file  . Intimate partner violence    Fear of current or ex partner: Not on file    Emotionally abused: Not on file    Physically abused: Not on file    Forced sexual activity: Not on file   Other Topics Concern  . Not on file  Social History Narrative   ** Merged History Encounter **        Outpatient Medications Prior to Visit  Medication Sig Dispense Refill  . Blood Glucose Monitoring Suppl (TRUE METRIX METER) w/Device KIT Use as instructed 1 kit 0  . glucose blood (TRUE METRIX BLOOD GLUCOSE TEST) test strip Use as instructed. Check blood glucose levels twice per day. 100 each 12  . atorvastatin (LIPITOR) 40 MG tablet Take 2 tablets (80 mg total) by mouth daily. 60 tablet 3  . insulin glargine (LANTUS) 100 UNIT/ML injection Inject 60 units at bedtime and 30 units in the morning. 30 mL 3  . Insulin Syringe-Needle U-100 (INSULIN SYRINGE 1CC/30GX5/16") 30G X 5/16" 1 ML MISC Inject 1 application into the skin 2 times daily at 12 noon and 4 pm. 100 each 5  . levothyroxine (SYNTHROID) 150 MCG tablet Take 1 tablet (150 mcg total) by mouth daily before breakfast. 30 tablet 1  . metFORMIN (GLUCOPHAGE) 1000 MG tablet Take 1 tablet (1,000 mg total) by mouth 2 (two) times daily with a meal. 60 tablet 3  . TRUEPLUS LANCETS 28G MISC Use as instructed. Check blood glucose  levels twice per day. 100 each 3  . lisinopril (ZESTRIL) 2.5 MG tablet Take 1 tablet (2.5 mg total) by mouth daily. 30 tablet 3   No facility-administered medications prior to visit.     No Known Allergies  ROS Review of Systems  Gastrointestinal: Positive for constipation.  All other systems reviewed and are negative.     Objective:    Physical Exam  Constitutional: She is oriented to person, place, and time. She appears well-developed and well-nourished.  Neck: Neck supple.  Cardiovascular: Normal rate and regular rhythm.  Pulmonary/Chest: Effort normal and breath sounds normal.  Abdominal: Soft. Bowel sounds are normal. She exhibits distension.  Musculoskeletal: Normal range of motion.  Neurological: She is oriented to person, place, and time.  Psychiatric: She has a normal mood and affect.    BP  101/65 (BP Location: Left Arm, Patient Position: Sitting, Cuff Size: Normal)   Pulse 76   Temp (!) 97.3 F (36.3 C) (Temporal)   Ht 5' 3"  (1.6 m)   Wt 167 lb 3.2 oz (75.8 kg)   LMP 07/28/2018 (Approximate)   SpO2 97%   BMI 29.62 kg/m  Wt Readings from Last 3 Encounters:  11/28/18 167 lb 3.2 oz (75.8 kg)  08/13/18 169 lb 3.2 oz (76.7 kg)  02/19/18 165 lb (74.8 kg)     Health Maintenance Due  Topic Date Due  . OPHTHALMOLOGY EXAM  12/05/1991    There are no preventive care reminders to display for this patient.  Lab Results  Component Value Date   TSH 0.423 (L) 11/28/2018   Lab Results  Component Value Date   WBC 12.3 (H) 11/28/2018   HGB 12.6 11/28/2018   HCT 36.9 11/28/2018   MCV 87 11/28/2018   PLT 327 11/28/2018   Lab Results  Component Value Date   NA 143 11/28/2018   K 4.4 11/28/2018   CO2 22 11/28/2018   GLUCOSE 78 11/28/2018   BUN 8 11/28/2018   CREATININE 0.60 11/28/2018   BILITOT 0.7 11/28/2018   ALKPHOS 77 11/28/2018   AST 15 11/28/2018   ALT 21 11/28/2018   PROT 6.8 11/28/2018   ALBUMIN 3.9 11/28/2018   CALCIUM 9.3 11/28/2018   ANIONGAP 12 01/16/2015   Lab Results  Component Value Date   CHOL 140 11/28/2018   Lab Results  Component Value Date   HDL 34 (L) 11/28/2018   Lab Results  Component Value Date   LDLCALC 86 11/28/2018   Lab Results  Component Value Date   TRIG 108 11/28/2018   Lab Results  Component Value Date   CHOLHDL 4.1 11/28/2018   Lab Results  Component Value Date   HGBA1C 8.3 (A) 11/28/2018      Assessment & Plan:  Constipation, unspecified constipation type Denice was seen today for diabetes.  Diagnoses and all orders for this visit:  Type 2 diabetes mellitus with diabetic polyneuropathy, with long-term current use of insulin (HCC) A1c today 8.3 previously 3 months ago 8.5 not a significant of improvement discussed decreasing foods that are high in carbohydrates are the following rice, potatoes, breads,  sugars, and pastas.  Reduction in the intake (eating) will assist in lowering your blood sugars. -     CBC with Differential future; Future -     CMP14+EGFR; Future -     Lipid panel; Future -     metFORMIN (GLUCOPHAGE) 1000 MG tablet; Take 1 tablet (1,000 mg total) by mouth 2 (two) times daily with a meal. -  TRUEplus Lancets 28G MISC; Use as instructed. Check blood glucose levels twice per day. -     Insulin Syringe-Needle U-100 (INSULIN SYRINGE 1CC/30GX5/16") 30G X 5/16" 1 ML MISC; Inject 1 application into the skin 2 times daily at 12 noon and 4 pm. -     insulin glargine (LANTUS) 100 UNIT/ML injection; Inject 60 units at bedtime and 30 units in the morning. -     Lipid panel -     CMP14+EGFR -     CBC with Differential future -     HgB A1c -     Glucose (CBG)  Hypothyroidism, unspecified type -     levothyroxine (SYNTHROID) 150 MCG tablet; Take 1 tablet (150 mcg total) by mouth daily before breakfast. -     TSH + free T4; Future -     TSH + free T4  Elevated lipoprotein(a) Reduction in fatty foods examples of cheese, butter, fried foods and organ meats -     atorvastatin (LIPITOR) 40 MG tablet; Take 2 tablets (80 mg total) by mouth daily.  Constipation, unspecified constipation type Encouraged to eat more fruits and vegetables and drink 64 ounces of water- added Senokot  Other orders -     lisinopril (ZESTRIL) 2.5 MG tablet; Take 1 tablet (2.5 mg total) by mouth daily. -     glipiZIDE (GLUCOTROL) 10 MG tablet; Take 1 tablet (10 mg total) by mouth 2 (two) times daily before a meal. -     senna (SENOKOT) 8.6 MG TABS tablet; Take 1 tablet (8.6 mg total) by mouth daily.    Follow-up: Return in about 3 months (around 02/28/2019) for Diabetes ,HTN, lipids.    Kerin Perna, NP

## 2018-11-28 NOTE — Patient Instructions (Signed)
Diabetes Mellitus and Exercise Exercising regularly is important for your overall health, especially when you have diabetes (diabetes mellitus). Exercising is not only about losing weight. It has many other health benefits, such as increasing muscle strength and bone density and reducing body fat and stress. This leads to improved fitness, flexibility, and endurance, all of which result in better overall health. Exercise has additional benefits for people with diabetes, including:  Reducing appetite.  Helping to lower and control blood glucose.  Lowering blood pressure.  Helping to control amounts of fatty substances (lipids) in the blood, such as cholesterol and triglycerides.  Helping the body to respond better to insulin (improving insulin sensitivity).  Reducing how much insulin the body needs.  Decreasing the risk for heart disease by: ? Lowering cholesterol and triglyceride levels. ? Increasing the levels of good cholesterol. ? Lowering blood glucose levels. What is my activity plan? Your health care provider or certified diabetes educator can help you make a plan for the type and frequency of exercise (activity plan) that works for you. Make sure that you:  Do at least 150 minutes of moderate-intensity or vigorous-intensity exercise each week. This could be brisk walking, biking, or water aerobics. ? Do stretching and strength exercises, such as yoga or weightlifting, at least 2 times a week. ? Spread out your activity over at least 3 days of the week.  Get some form of physical activity every day. ? Do not go more than 2 days in a row without some kind of physical activity. ? Avoid being inactive for more than 30 minutes at a time. Take frequent breaks to walk or stretch.  Choose a type of exercise or activity that you enjoy, and set realistic goals.  Start slowly, and gradually increase the intensity of your exercise over time. What do I need to know about managing my  diabetes?   Check your blood glucose before and after exercising. ? If your blood glucose is 240 mg/dL (13.3 mmol/L) or higher before you exercise, check your urine for ketones. If you have ketones in your urine, do not exercise until your blood glucose returns to normal. ? If your blood glucose is 100 mg/dL (5.6 mmol/L) or lower, eat a snack containing 15-20 grams of carbohydrate. Check your blood glucose 15 minutes after the snack to make sure that your level is above 100 mg/dL (5.6 mmol/L) before you start your exercise.  Know the symptoms of low blood glucose (hypoglycemia) and how to treat it. Your risk for hypoglycemia increases during and after exercise. Common symptoms of hypoglycemia can include: ? Hunger. ? Anxiety. ? Sweating and feeling clammy. ? Confusion. ? Dizziness or feeling light-headed. ? Increased heart rate or palpitations. ? Blurry vision. ? Tingling or numbness around the mouth, lips, or tongue. ? Tremors or shakes. ? Irritability.  Keep a rapid-acting carbohydrate snack available before, during, and after exercise to help prevent or treat hypoglycemia.  Avoid injecting insulin into areas of the body that are going to be exercised. For example, avoid injecting insulin into: ? The arms, when playing tennis. ? The legs, when jogging.  Keep records of your exercise habits. Doing this can help you and your health care provider adjust your diabetes management plan as needed. Write down: ? Food that you eat before and after you exercise. ? Blood glucose levels before and after you exercise. ? The type and amount of exercise you have done. ? When your insulin is expected to peak, if you use   insulin. Avoid exercising at times when your insulin is peaking.  When you start a new exercise or activity, work with your health care provider to make sure the activity is safe for you, and to adjust your insulin, medicines, or food intake as needed.  Drink plenty of water while  you exercise to prevent dehydration or heat stroke. Drink enough fluid to keep your urine clear or pale yellow. Summary  Exercising regularly is important for your overall health, especially when you have diabetes (diabetes mellitus).  Exercising has many health benefits, such as increasing muscle strength and bone density and reducing body fat and stress.  Your health care provider or certified diabetes educator can help you make a plan for the type and frequency of exercise (activity plan) that works for you.  When you start a new exercise or activity, work with your health care provider to make sure the activity is safe for you, and to adjust your insulin, medicines, or food intake as needed. This information is not intended to replace advice given to you by your health care provider. Make sure you discuss any questions you have with your health care provider. Document Released: 03/31/2003 Document Revised: 08/02/2016 Document Reviewed: 06/20/2015 Elsevier Patient Education  2020 [employer]  

## 2018-11-29 LAB — CMP14+EGFR
ALT: 21 IU/L (ref 0–32)
AST: 15 IU/L (ref 0–40)
Albumin/Globulin Ratio: 1.3 (ref 1.2–2.2)
Albumin: 3.9 g/dL (ref 3.8–4.8)
Alkaline Phosphatase: 77 IU/L (ref 39–117)
BUN/Creatinine Ratio: 13 (ref 9–23)
BUN: 8 mg/dL (ref 6–20)
Bilirubin Total: 0.7 mg/dL (ref 0.0–1.2)
CO2: 22 mmol/L (ref 20–29)
Calcium: 9.3 mg/dL (ref 8.7–10.2)
Chloride: 107 mmol/L — ABNORMAL HIGH (ref 96–106)
Creatinine, Ser: 0.6 mg/dL (ref 0.57–1.00)
GFR calc Af Amer: 136 mL/min/{1.73_m2} (ref >59)
GFR calc non Af Amer: 118 mL/min/{1.73_m2} (ref >59)
Globulin, Total: 2.9 g/dL (ref 1.5–4.5)
Glucose: 78 mg/dL (ref 65–99)
Potassium: 4.4 mmol/L (ref 3.5–5.2)
Sodium: 143 mmol/L (ref 134–144)
Total Protein: 6.8 g/dL (ref 6.0–8.5)

## 2018-11-29 LAB — CBC WITH DIFFERENTIAL/PLATELET
Basophils Absolute: 0 10*3/uL (ref 0.0–0.2)
Basos: 0 %
EOS (ABSOLUTE): 0.1 10*3/uL (ref 0.0–0.4)
Eos: 1 %
Hematocrit: 36.9 % (ref 34.0–46.6)
Hemoglobin: 12.6 g/dL (ref 11.1–15.9)
Immature Grans (Abs): 0 10*3/uL (ref 0.0–0.1)
Immature Granulocytes: 0 %
Lymphocytes Absolute: 4.2 10*3/uL — ABNORMAL HIGH (ref 0.7–3.1)
Lymphs: 34 %
MCH: 29.6 pg (ref 26.6–33.0)
MCHC: 34.1 g/dL (ref 31.5–35.7)
MCV: 87 fL (ref 79–97)
Monocytes Absolute: 0.5 10*3/uL (ref 0.1–0.9)
Monocytes: 4 %
Neutrophils Absolute: 7.5 10*3/uL — ABNORMAL HIGH (ref 1.4–7.0)
Neutrophils: 61 %
Platelets: 327 10*3/uL (ref 150–450)
RBC: 4.25 x10E6/uL (ref 3.77–5.28)
RDW: 12.6 % (ref 11.7–15.4)
WBC: 12.3 10*3/uL — ABNORMAL HIGH (ref 3.4–10.8)

## 2018-11-29 LAB — LIPID PANEL
Chol/HDL Ratio: 4.1 ratio (ref 0.0–4.4)
Cholesterol, Total: 140 mg/dL (ref 100–199)
HDL: 34 mg/dL — ABNORMAL LOW (ref >39)
LDL Chol Calc (NIH): 86 mg/dL (ref 0–99)
Triglycerides: 108 mg/dL (ref 0–149)
VLDL Cholesterol Cal: 20 mg/dL (ref 5–40)

## 2018-11-29 LAB — TSH+FREE T4
Free T4: 1.28 ng/dL (ref 0.82–1.77)
TSH: 0.423 u[IU]/mL — ABNORMAL LOW (ref 0.450–4.500)

## 2018-12-26 MED FILL — LEVOTHYROXINE 150 MCG TAB: 150 | 30 days supply | Qty: 30 | Fill #1

## 2018-12-26 MED FILL — metFORMIN HCL 1000 MG TABS: 1000 | 30 days supply | Qty: 60 | Fill #3

## 2019-01-28 ENCOUNTER — Other Ambulatory Visit (INDEPENDENT_AMBULATORY_CARE_PROVIDER_SITE_OTHER): Payer: Self-pay | Admitting: Primary Care

## 2019-01-28 ENCOUNTER — Telehealth (INDEPENDENT_AMBULATORY_CARE_PROVIDER_SITE_OTHER): Payer: Self-pay

## 2019-01-28 DIAGNOSIS — E039 Hypothyroidism, unspecified: Secondary | ICD-10-CM

## 2019-01-28 MED ORDER — LEVOTHYROXINE SODIUM 150 MCG PO TABS: 150.0000 ug | ORAL_TABLET | Freq: Every day | ORAL | 1 refills | Status: DC

## 2019-01-28 NOTE — Telephone Encounter (Signed)
FWD to PCP

## 2019-01-28 NOTE — Telephone Encounter (Signed)
Patient called to make a medication refill for levothyroxine (SYNTHROID) 150 MCG tablet    Patient uses  [facility]   Please advice [phone]

## 2019-01-28 NOTE — Telephone Encounter (Signed)
Thyroid medication sent 148mcg labs placed needs TSH/T4 6 weeks

## 2019-01-29 MED FILL — LEVOTHYROXINE 150 MCG TAB: 150 | 30 days supply | Qty: 30 | Fill #0

## 2019-01-29 NOTE — Telephone Encounter (Signed)
Patient is aware medication has been sent. Patient has an appointment to see PCP on Feb 12.

## 2019-02-02 MED FILL — $LANTUS 100 UNITS/ML VIAL: 100 | 30 days supply | Qty: 30 | Fill #1

## 2019-03-06 ENCOUNTER — Encounter (INDEPENDENT_AMBULATORY_CARE_PROVIDER_SITE_OTHER): Payer: Self-pay | Admitting: Primary Care

## 2019-03-06 ENCOUNTER — Ambulatory Visit (INDEPENDENT_AMBULATORY_CARE_PROVIDER_SITE_OTHER): Payer: Self-pay | Admitting: Primary Care

## 2019-03-06 ENCOUNTER — Other Ambulatory Visit: Payer: Self-pay

## 2019-03-06 DIAGNOSIS — E1142 Type 2 diabetes mellitus with diabetic polyneuropathy: Secondary | ICD-10-CM

## 2019-03-06 DIAGNOSIS — E039 Hypothyroidism, unspecified: Secondary | ICD-10-CM

## 2019-03-06 DIAGNOSIS — Z794 Long term (current) use of insulin: Secondary | ICD-10-CM

## 2019-03-06 DIAGNOSIS — E7841 Elevated Lipoprotein(a): Secondary | ICD-10-CM

## 2019-03-06 LAB — POCT GLYCOSYLATED HEMOGLOBIN (HGB A1C): Hemoglobin A1C: 10.2 % — AB (ref 4.0–5.6)

## 2019-03-06 MED ORDER — "INSULIN SYRINGE 30G X 5/16"" 1 ML MISC": 1.0000 "application " | Freq: Two times a day (BID) | 5 refills | Status: DC

## 2019-03-06 MED ORDER — SENNA 8.6 MG PO TABS: 1.0000 | ORAL_TABLET | Freq: Every day | ORAL | 0 refills | Status: DC

## 2019-03-06 MED ORDER — METFORMIN HCL 1000 MG PO TABS: 1000.0000 mg | ORAL_TABLET | Freq: Two times a day (BID) | ORAL | 1 refills | Status: DC

## 2019-03-06 MED ORDER — INSULIN GLARGINE 100 UNIT/ML ~~LOC~~ SOLN: SUBCUTANEOUS | 1 refills | Status: DC

## 2019-03-06 MED ORDER — ATORVASTATIN CALCIUM 40 MG PO TABS: 80.0000 mg | ORAL_TABLET | Freq: Every day | ORAL | 1 refills | Status: DC

## 2019-03-06 MED ORDER — LISINOPRIL 2.5 MG PO TABS: 2.5000 mg | ORAL_TABLET | Freq: Every day | ORAL | 1 refills | Status: DC

## 2019-03-06 MED ORDER — LEVOTHYROXINE SODIUM 150 MCG PO TABS: 150.0000 ug | ORAL_TABLET | Freq: Every day | ORAL | 1 refills | Status: DC

## 2019-03-06 MED ORDER — ATORVASTATIN CALCIUM 40 MG PO TABS: 40.0000 mg | ORAL_TABLET | Freq: Every day | ORAL | 1 refills | Status: DC

## 2019-03-06 MED ORDER — GLIPIZIDE 10 MG PO TABS: 10.0000 mg | ORAL_TABLET | Freq: Two times a day (BID) | ORAL | 1 refills | Status: DC

## 2019-03-06 MED FILL — LISINOPRIL 2.5 MG TABLET: 2.5 | 30 days supply | Qty: 30 | Fill #0

## 2019-03-06 MED FILL — metFORMIN HCL 1000 MG TABS: 1000 | 30 days supply | Qty: 60 | Fill #0

## 2019-03-06 MED FILL — LEVOTHYROXINE 150 MCG TAB: 150 | 30 days supply | Qty: 30 | Fill #0

## 2019-03-06 MED FILL — $LANTUS 100 UNITS/ML VIAL: 100 | 33 days supply | Qty: 30 | Fill #0

## 2019-03-06 MED FILL — ?ATORVASTATIN 40MG TABL: 40 | 30 days supply | Qty: 60 | Fill #0

## 2019-03-06 MED FILL — TRUEPLUS SYR 1ML 30GX5/16": 30G X 5/16" | 50 days supply | Qty: 100 | Fill #0

## 2019-03-06 MED FILL — TRUEPLUS SYR 1ML 30GX5/16: 30G X 5/16" | 50 days supply | Qty: 100 | Fill #0

## 2019-03-06 MED FILL — glipiZIDE 10 MG TABS: 10 | 30 days supply | Qty: 60 | Fill #0

## 2019-03-06 NOTE — Progress Notes (Signed)
Virtual Visit via Telephone Note  I connected with Audrey Mcmillan on 03/06/19 at  8:30 AM EST by telephone and verified that I am speaking with the correct person using two identifiers.   I discussed the limitations, risks, security and privacy concerns of performing an evaluation and management service by telephone and the availability of in person appointments. I also discussed with the patient that there may be a patient responsible charge related to this service. The patient expressed understanding and agreed to proceed.   History of Present Illness: Audrey Mcmillan  Is having a tele visit due to inclement weather she was originally schedule for TSH/T4 today. Confussion with taking her diabetes medication. She thought because no refills were sent on last visit her orals were stopped. However all medications would had refills and lasted to this appointment . She will be in for labs today. A1C and TSH.  Past Medical History:  Diagnosis Date  . Diabetes mellitus without complication ([place])   . High cholesterol     Current Outpatient Medications on File Prior to Visit  Medication Sig Dispense Refill  . Blood Glucose Monitoring Suppl (TRUE METRIX METER) w/Device KIT Use as instructed 1 kit 0  . glucose blood (TRUE METRIX BLOOD GLUCOSE TEST) test strip Use as instructed. Check blood glucose levels twice per day. 100 each 12  . TRUEplus Lancets 28G MISC Use as instructed. Check blood glucose levels twice per day. 100 each 3   No current facility-administered medications on file prior to visit.   Observations/Objective: Audrey Mcmillan was seen today for diabetes, hypertension and medication refill.  Diagnoses and all orders for this visit:  Type 2 diabetes mellitus with diabetic polyneuropathy, with linsulin. long-term current use of insulin (Audrey Mcmillan) Discussed taking medication oral medication daily as prescribed and continue insulin misunderstanding stopped oral agents Not at goal  Less  than 6.5 hemoglobin A1c.  Decrease foods that are high in carbohydrates are the following rice, potatoes, breads, sugars, and pastas.  Reduction in the intake (eating) will assist in lowering your blood sugars. -     HgB A1c; Future -     metFORMIN (GLUCOPHAGE) 1000 MG tablet; Take 1 tablet (1,000 mg total) by mouth 2 (two) times daily with a meal. -     insulin glargine (LANTUS) 100 UNIT/ML injection; Inject 60 units at bedtime and 30 units in the morning. -     Insulin Syringe-Needle U-100 (INSULIN SYRINGE 1CC/30GX5/16") 30G X 5/16" 1 ML MISC; Inject 1 application into the skin 2 times daily at 12 noon and 4 pm.  Elevated lipoprotein(a) Decrease your fatty foods, red meat, cheese, milk and increase fiber like whole grains and veggies.  --     atorvastatin (LIPITOR) 40 MG tablet; Take 1 tablet (40 mg total) by mouth daily.  Hypothyroidism, unspecified type Refill -     levothyroxine (SYNTHROID) 150 MCG tablet; Take 1 tablet (150 mcg total) by mouth daily before breakfast.  Other orders -     glipiZIDE (GLUCOTROL) 10 MG tablet; Take 1 tablet (10 mg total) by mouth 2 (two) times daily before a meal. -     lisinopril (ZESTRIL) 2.5 MG tablet; Take 1 tablet (2.5 mg total) by mouth daily. -     senna (SENOKOT) 8.6 MG TABS tablet; Take 1 tablet (8.6 mg total) by mouth daily.    Assessment and Plan:  Follow Up Instructions:    I discussed the assessment and treatment plan with the patient. The patient was provided  an opportunity to ask questions and all were answered. The patient agreed with the plan and demonstrated an understanding of the instructions.   The patient was advised to call back or seek an in-person evaluation if the symptoms worsen or if the condition fails to improve as anticipated.  I provided 15 minutes of non-face-to-face time during this encounter.   Kerin Perna, NP

## 2019-03-06 NOTE — Progress Notes (Signed)
Fasting CBG this am 126 Pt states that Metformin and glipizide were  suspended by PCP  Pt is unable to check Bp at home; she does not have a cuff

## 2019-03-07 LAB — LIPID PANEL
Chol/HDL Ratio: 4.2 ratio (ref 0.0–4.4)
Cholesterol, Total: 167 mg/dL (ref 100–199)
HDL: 40 mg/dL (ref >39)
LDL Chol Calc (NIH): 105 mg/dL — ABNORMAL HIGH (ref 0–99)
Triglycerides: 123 mg/dL (ref 0–149)
VLDL Cholesterol Cal: 22 mg/dL (ref 5–40)

## 2019-03-07 LAB — TSH+FREE T4
Free T4: 1.39 ng/dL (ref 0.82–1.77)
TSH: 0.997 u[IU]/mL (ref 0.450–4.500)

## 2019-03-09 ENCOUNTER — Other Ambulatory Visit (INDEPENDENT_AMBULATORY_CARE_PROVIDER_SITE_OTHER): Payer: Self-pay | Admitting: Primary Care

## 2019-03-09 DIAGNOSIS — E039 Hypothyroidism, unspecified: Secondary | ICD-10-CM

## 2019-03-09 MED ORDER — LEVOTHYROXINE SODIUM 150 MCG PO TABS: 150.0000 ug | ORAL_TABLET | Freq: Every day | ORAL | 1 refills | Status: DC

## 2019-04-07 ENCOUNTER — Telehealth: Payer: Self-pay | Admitting: Pharmacist

## 2019-04-07 MED FILL — $LANTUS 100 UNITS/ML VIAL: 100 | 30 days supply | Qty: 30 | Fill #2

## 2019-04-07 MED FILL — LEVOTHYROXINE SODIUM 150 MC: 150 | 30 days supply | Qty: 30 | Fill #1

## 2019-04-07 MED FILL — metFORMIN HCL 1000 MG TABS: 1000 | 30 days supply | Qty: 60 | Fill #1

## 2019-04-07 NOTE — Telephone Encounter (Signed)
Patient's pharmacy requesting change in levothyroxine brand. Will notify provider.   Sharyn Lull -   Is it okay for [place] to change the brand?

## 2019-04-07 NOTE — Telephone Encounter (Signed)
Yes needs TSH/T4 in 6 weeks due to brand change

## 2019-05-05 MED FILL — LEVOTHYROXINE SODIUM 150 MC: 150 | 30 days supply | Qty: 30 | Fill #0

## 2019-05-25 MED FILL — metFORMIN HCL 1000 MG TABS: 1000 | 30 days supply | Qty: 60 | Fill #2

## 2019-05-25 MED FILL — ?ATORVASTATIN 40MG TABLET: 40 | 30 days supply | Qty: 60 | Fill #1

## 2019-06-03 ENCOUNTER — Ambulatory Visit (INDEPENDENT_AMBULATORY_CARE_PROVIDER_SITE_OTHER): Payer: Self-pay | Admitting: Primary Care

## 2019-06-11 MED FILL — LEVOTHYROXINE SODIUM 150 MC: 150 | 30 days supply | Qty: 30 | Fill #1

## 2019-06-12 ENCOUNTER — Ambulatory Visit (INDEPENDENT_AMBULATORY_CARE_PROVIDER_SITE_OTHER): Payer: Self-pay | Admitting: Primary Care

## 2019-06-12 ENCOUNTER — Encounter (INDEPENDENT_AMBULATORY_CARE_PROVIDER_SITE_OTHER): Payer: Self-pay | Admitting: Primary Care

## 2019-06-12 ENCOUNTER — Other Ambulatory Visit: Payer: Self-pay

## 2019-06-12 VITALS — BP 116/75 | HR 67 | Temp 97.3°F | Ht 63.0 in | Wt 165.6 lb

## 2019-06-12 DIAGNOSIS — E1142 Type 2 diabetes mellitus with diabetic polyneuropathy: Secondary | ICD-10-CM

## 2019-06-12 DIAGNOSIS — E039 Hypothyroidism, unspecified: Secondary | ICD-10-CM

## 2019-06-12 DIAGNOSIS — Z794 Long term (current) use of insulin: Secondary | ICD-10-CM

## 2019-06-12 LAB — POCT GLYCOSYLATED HEMOGLOBIN (HGB A1C): Hemoglobin A1C: 10 % — AB (ref 4.0–5.6)

## 2019-06-12 LAB — GLUCOSE, POCT (MANUAL RESULT ENTRY): POC Glucose: 185 mg/dl — AB (ref 70–99)

## 2019-06-12 MED ORDER — LISINOPRIL 2.5 MG PO TABS: 2.5000 mg | ORAL_TABLET | Freq: Every day | ORAL | 1 refills | Status: DC

## 2019-06-12 MED FILL — LISINOPRIL 2.5 MG TABLET: 2.5 | 90 days supply | Qty: 90 | Fill #0

## 2019-06-12 NOTE — Patient Instructions (Signed)
Diabetes mellitus y actividad fsica Diabetes Mellitus and Exercise Hacer actividad fsica habitualmente es importante para el estado de salud general, en especial si tiene diabetes (diabetes mellitus). La actividad fsica no solo se reduce a bajar de peso. Aporta muchos beneficios para la salud, como aumento de la fuerza muscular y la densidad sea, y reduccin de las grasas corporales y el estrs. Esto mejora el estado fsico, la flexibilidad y la resistencia, y todo ello redunda en un mejor estado de salud general. La actividad fsica tiene beneficios adicionales para los diabticos, entre ellos:  Disminuye el apetito.  Ayuda a bajar y mantener la glucemia bajo control.  Baja la presin arterial.  Ayuda a controlar las cantidades de sustancias grasas (lpidos) en la sangre, como el colesterol y los triglicridos.  Mejora la respuesta del cuerpo a la insulina (optimizacin de la sensibilidad a la insulina).  Reduce la cantidad de insulina que el cuerpo necesita.  Reduce el riesgo de sufrir cardiopata coronaria de la siguiente forma: ? Baja los niveles de colesterol y triglicridos. ? Aumenta los niveles de colesterol bueno. ? Disminuye la glucemia. Cul es mi plan de actividad? El mdico o un educador para la diabetes certificado pueden ayudarlo a elaborar un plan respecto del tipo y de la frecuencia de actividad fsica (plan de actividades) adecuado para usted. Asegrese de lo siguiente:  Haga por lo menos 150minutos semanales de ejercicios de intensidad moderada o vigorosa. Estos podran ser caminatas dinmicas, ciclismo o gimnasia acutica. ? Haga ejercicios de elongacin y de fortalecimiento, como yoga o levantamiento de pesas, por lo menos 2veces por semana. ? Reparta la actividad en al menos 3das de la semana.  Haga algn tipo de actividad fsica todos los das. ? No deje pasar ms de 2das seguidos sin hacer algn tipo de actividad fsica. ? Evite permanecer inactivo  durante ms de 30minutos seguidos. Tmese descansos frecuentes para caminar o estirarse.  Elija un tipo de ejercicio o de actividad que disfrute y establezca objetivos realistas.  Comience lentamente y aumente de manera gradual la intensidad del ejercicio con el correr del tiempo. Qu debo saber acerca del control de la diabetes?   Contrlese la glucemia antes y despus de ejercitarse. ? Si la glucemia es de 240mg/dl (13,3mmol/l) o ms antes de comenzar a hacer actividad fsica, controle la orina para detectar la presencia de cetonas. Si tiene cetonas en la orina, no haga ejercicio hasta que la glucemia se normalice. ? Si la glucemia es de 100 mg/dl (5.6 mmol/l) o menos, tome una colacin que contenga entre 15 y 20 gramos de carbohidratos. Controle la glucemia 15 minutos despus de la colacin para asegurarse de que el nivel est por encima de 100 mg/dl (5.6 mmol/l) antes de comenzar a hacer actividad fsica.  Conozca los sntomas de la glucemia baja (hipoglucemia) y aprenda cmo tratarla. El riesgo de tener hipoglucemia aumenta durante y despus de hacer actividad fsica. Los sntomas frecuentes de hipoglucemia pueden incluir los siguientes: ? Hambre. ? Ansiedad. ? Sudoracin y piel hmeda. ? Confusin. ? Mareos o sensacin de desvanecimiento. ? Aumento de la frecuencia cardaca o palpitaciones. ? Visin borrosa. ? Hormigueo o adormecimiento alrededor de la boca, los labios o la lengua. ? Estremecimientos y temblores. ? Irritabilidad.  Tenga una colacin de carbohidratos de accin rpida disponible antes, durante y despus de ejercitarse, a fin de evitar o tratar la hipoglucemia.  Evite inyectarse insulina en las zonas del cuerpo que ejercitar. Por ejemplo, evite inyectarse insulina en: ? Los brazos,   si juega al tenis. ? Las piernas, si corre.  Lleve registros de sus hbitos de actividad fsica. Esto puede ayudarlos a usted y al mdico a adaptar el plan de control de la diabetes  segn sea necesario. Escriba los siguientes datos: ? Los alimentos que consume antes y despus de hacer actividad fsica. ? Los niveles de glucosa en la sangre antes y despus de hacer ejericios. ? El tipo y cantidad de actividad fsica que realiza. ? Cuando se prev que la insulina alcance su valor mximo, si usa insulina. No haga actividad fsica en los momentos en que insulina alcanza su valor mximo.  Cuando comience un ejercicio o una actividad nuevos, trabaje con el mdico para asegurarse de que la actividad sea segura para usted y para ajustar la insulina, los medicamentos o la ingesta de alimentos segn sea necesario.  Beba gran cantidad de agua mientras hace ejercicio para evitar la deshidratacin o los golpes de calor. Beba suficiente lquido como para mantener la orina clara o de color amarillo plido. Resumen  Hacer actividad fsica habitualmente es importante para el estado de salud general, en especial si tiene diabetes (diabetes mellitus).  La actividad fsica aporta muchos beneficios para la salud, como aumentar la fuerza muscular y la densidad sea, y reducir las grasas corporales y el estrs.  El mdico o un educador para la diabetes certificado pueden ayudarlo a elaborar un plan respecto del tipo y de la frecuencia de actividad fsica (plan de actividades) adecuado para usted.  Cuando comience un ejercicio o una actividad nuevos, trabaje con el mdico para asegurarse de que la actividad sea segura para usted y para ajustar la insulina, los medicamentos o la ingesta de alimentos segn sea necesario. Esta informacin no tiene como fin reemplazar el consejo del mdico. Asegrese de hacerle al mdico cualquier pregunta que tenga. Document Revised: 11/05/2016 Document Reviewed: 06/20/2015 Elsevier Patient Education  2020 Elsevier Inc.  

## 2019-06-12 NOTE — Progress Notes (Signed)
Established Patient Office Visit  Subjective:  Patient ID: Audrey Mcmillan, female    DOB: 11-21-1980  Age: 39 y.o. MRN: [id number]  CC:  Chief Complaint  Patient presents with  . Diabetes    HPI Audrey Mcmillan is a 39 year old Hispanic female that speaks Fluent [place]. In today for diabetes follow up . She states she is compliant with all medication  lantus 30 am 60 pm, metformin 1056m glipizide 10 mg both BID . Diabetes remains uncontrolled. Blood pressure well controlled on ACE for renal protection.   Past Medical History:  Diagnosis Date  . [place]   . High cholesterol     History reviewed. No pertinent surgical history.  History reviewed. No pertinent family history.  Social History   Socioeconomic History  . Marital status: Single    Spouse name: Not on file  . Number of children: Not on file  . Years of education: Not on file  . Highest education level: Not on file  Occupational History  . Not on file  Tobacco Use  . Smoking status: Never Smoker  . Smokeless tobacco: Never Used  Substance and Sexual Activity  . Alcohol use: No    Comment: occ  . Drug use: No  . Sexual activity: Yes    Birth control/protection: None  Other Topics Concern  . Not on file  Social History Narrative   ** Merged History Encounter **       Social Determinants of Health   Financial Resource Strain:   . Difficulty of Paying Living Expenses:   Food Insecurity:   . Worried About RCharity fundraiserin the Last Year:   . RArboriculturistin the Last Year:   Transportation Needs:   . LFilm/video editor(Medical):   .Marland KitchenLack of Transportation (Non-Medical):   Physical Activity:   . Days of Exercise per Week:   . Minutes of Exercise per Session:   Stress:   . Feeling of Stress :   Social Connections:   . Frequency of Communication with Friends and Family:   . Frequency of Social Gatherings with Friends and Family:    . Attends Religious Services:   . Active Member of Clubs or Organizations:   . Attends CArchivistMeetings:   .Marland KitchenMarital Status:   Intimate Partner Violence:   . Fear of Current or Ex-Partner:   . Emotionally Abused:   .Marland KitchenPhysically Abused:   . Sexually Abused:     Outpatient Medications Prior to Visit  Medication Sig Dispense Refill  . atorvastatin (LIPITOR) 40 MG tablet Take 1 tablet (40 mg total) by mouth daily. 180 tablet 1  . Blood Glucose Monitoring Suppl (TRUE METRIX METER) w/Device KIT Use as instructed 1 kit 0  . glipiZIDE (GLUCOTROL) 10 MG tablet Take 1 tablet (10 mg total) by mouth 2 (two) times daily before a meal. 180 tablet 1  . glucose blood (TRUE METRIX BLOOD GLUCOSE TEST) test strip Use as instructed. Check blood glucose levels twice per day. 100 each 12  . insulin glargine (LANTUS) 100 UNIT/ML injection Inject 60 units at bedtime and 30 units in the morning. 90 mL 1  . Insulin Syringe-Needle U-100 (INSULIN SYRINGE 1CC/30GX5/16") 30G X 5/16" 1 ML MISC Inject 1 application into the skin 2 times daily at 12 noon and 4 pm. 100 each 5  . levothyroxine (SYNTHROID) 150 MCG tablet Take 1 tablet (150 mcg total) by  mouth daily before breakfast. 90 tablet 1  . metFORMIN (GLUCOPHAGE) 1000 MG tablet Take 1 tablet (1,000 mg total) by mouth 2 (two) times daily with a meal. 180 tablet 1  . senna (SENOKOT) 8.6 MG TABS tablet Take 1 tablet (8.6 mg total) by mouth daily. 120 tablet 0  . TRUEplus Lancets 28G MISC Use as instructed. Check blood glucose levels twice per day. 100 each 3  . lisinopril (ZESTRIL) 2.5 MG tablet Take 1 tablet (2.5 mg total) by mouth daily. 90 tablet 1   No facility-administered medications prior to visit.    No Known Allergies  ROS Review of Systems  All other systems reviewed and are negative.     Objective:    Physical Exam  Constitutional: She is oriented to person, place, and time. She appears well-developed and well-nourished.   Cardiovascular: Normal rate and regular rhythm.  Pulmonary/Chest: Effort normal and breath sounds normal.  Abdominal: Soft. Bowel sounds are normal.  Musculoskeletal:        General: Normal range of motion.     Cervical back: Normal range of motion.  Neurological: She is oriented to person, place, and time.  Skin: Skin is warm and dry.  Psychiatric: She has a normal mood and affect.    BP 116/75 (BP Location: Right Arm, Patient Position: Sitting, Cuff Size: Normal)   Pulse 67   Temp (!) 97.3 F (36.3 C) (Temporal)   Ht 5' 3" (1.6 m)   Wt 165 lb 9.6 oz (75.1 kg)   LMP 05/14/2019 (Approximate)   SpO2 96%   BMI 29.33 kg/m  Wt Readings from Last 3 Encounters:  06/12/19 165 lb 9.6 oz (75.1 kg)  11/28/18 167 lb 3.2 oz (75.8 kg)  08/13/18 169 lb 3.2 oz (76.7 kg)     Health Maintenance Due  Topic Date Due  . OPHTHALMOLOGY EXAM  Never done  . COVID-19 Vaccine (1) Never done    There are no preventive care reminders to display for this patient.  Lab Results  Component Value Date   TSH 1.060 06/12/2019   Lab Results  Component Value Date   WBC 12.3 (H) 11/28/2018   HGB 12.6 11/28/2018   HCT 36.9 11/28/2018   MCV 87 11/28/2018   PLT 327 11/28/2018   Lab Results  Component Value Date   NA 143 11/28/2018   K 4.4 11/28/2018   CO2 22 11/28/2018   GLUCOSE 78 11/28/2018   BUN 8 11/28/2018   CREATININE 0.60 11/28/2018   BILITOT 0.7 11/28/2018   ALKPHOS 77 11/28/2018   AST 15 11/28/2018   ALT 21 11/28/2018   PROT 6.8 11/28/2018   ALBUMIN 3.9 11/28/2018   CALCIUM 9.3 11/28/2018   ANIONGAP 12 01/16/2015   Lab Results  Component Value Date   CHOL 167 03/06/2019   Lab Results  Component Value Date   HDL 40 03/06/2019   Lab Results  Component Value Date   LDLCALC 105 (H) 03/06/2019   Lab Results  Component Value Date   TRIG 123 03/06/2019   Lab Results  Component Value Date   CHOLHDL 4.2 03/06/2019   Lab Results  Component Value Date   HGBA1C 10.0 (A)  06/12/2019      Assessment & Plan:  Audrey Mcmillan was seen today for diabetes.  Diagnoses and all orders for this visit:  Type 2 diabetes mellitus with diabetic polyneuropathy, with long-term current use of insulin ([place])  Goal of therapy: Less than 6.5 hemoglobin A1c.  Reference clinical practice recommendations.decreas  foods that are high in carbohydrates are the following rice, potatoes, breads, sugars, and pastas.  Reduction in the intake (eating) will assist in lowering your blood sugars. -     HgB A1c -     Glucose (CBG) -     Ambulatory referral to Ophthalmology -     lisinopril (ZESTRIL) 2.5 MG tablet; Take 1 tablet (2.5 mg total) by mouth daily.  Hypothyroidism, unspecified type -     TSH + free T4    Meds ordered this encounter  Medications  . lisinopril (ZESTRIL) 2.5 MG tablet    Sig: Take 1 tablet (2.5 mg total) by mouth daily.    Dispense:  90 tablet    Refill:  1    Follow-up: Return in about 3 months (around 09/12/2019) for Appointment with Lurena Joiner and PCP 3 months.    Kerin Perna, NP

## 2019-06-13 LAB — TSH+FREE T4
Free T4: 1.19 ng/dL (ref 0.82–1.77)
TSH: 1.06 u[IU]/mL (ref 0.450–4.500)

## 2019-06-30 ENCOUNTER — Ambulatory Visit: Payer: Self-pay | Admitting: Pharmacist

## 2019-07-09 ENCOUNTER — Ambulatory Visit: Payer: Self-pay | Attending: Internal Medicine

## 2019-07-09 DIAGNOSIS — Z23 Encounter for immunization: Secondary | ICD-10-CM

## 2019-07-15 MED FILL — LEVOTHYROXINE SODIUM 150 MC: 150 | 30 days supply | Qty: 30 | Fill #2

## 2019-08-13 ENCOUNTER — Other Ambulatory Visit (INDEPENDENT_AMBULATORY_CARE_PROVIDER_SITE_OTHER): Payer: Self-pay | Admitting: Primary Care

## 2019-08-13 DIAGNOSIS — E039 Hypothyroidism, unspecified: Secondary | ICD-10-CM

## 2019-08-13 MED FILL — ?ATORVASTATIN 40MG TABLET: 40 | 30 days supply | Qty: 60 | Fill #3

## 2019-08-13 MED FILL — $LANTUS 100 UNITS/ML VIAL: 100 | 22 days supply | Qty: 20 | Fill #2

## 2019-08-13 MED FILL — metFORMIN HCL 1000 MG TABS: 1000 | 30 days supply | Qty: 60 | Fill #4

## 2019-08-13 MED FILL — LEVOTHYROXINE SODIUM 150 MC: 150 | 30 days supply | Qty: 30 | Fill #3

## 2019-09-14 ENCOUNTER — Ambulatory Visit (INDEPENDENT_AMBULATORY_CARE_PROVIDER_SITE_OTHER): Payer: Self-pay | Admitting: Family Medicine

## 2019-09-14 ENCOUNTER — Other Ambulatory Visit: Payer: Self-pay

## 2019-09-14 ENCOUNTER — Encounter (INDEPENDENT_AMBULATORY_CARE_PROVIDER_SITE_OTHER): Payer: Self-pay | Admitting: Family Medicine

## 2019-09-14 ENCOUNTER — Other Ambulatory Visit (INDEPENDENT_AMBULATORY_CARE_PROVIDER_SITE_OTHER): Payer: Self-pay | Admitting: Family Medicine

## 2019-09-14 VITALS — BP 115/72 | HR 79 | Ht 63.0 in | Wt 163.0 lb

## 2019-09-14 DIAGNOSIS — E039 Hypothyroidism, unspecified: Secondary | ICD-10-CM

## 2019-09-14 DIAGNOSIS — E1142 Type 2 diabetes mellitus with diabetic polyneuropathy: Secondary | ICD-10-CM

## 2019-09-14 DIAGNOSIS — Z794 Long term (current) use of insulin: Secondary | ICD-10-CM

## 2019-09-14 DIAGNOSIS — E7800 Pure hypercholesterolemia, unspecified: Secondary | ICD-10-CM

## 2019-09-14 LAB — POCT GLYCOSYLATED HEMOGLOBIN (HGB A1C): HbA1c, POC (controlled diabetic range): 9.9 % — AB (ref 0.0–7.0)

## 2019-09-14 LAB — GLUCOSE, POCT (MANUAL RESULT ENTRY): POC Glucose: 197 mg/dl — AB (ref 70–99)

## 2019-09-14 MED ORDER — LISINOPRIL 2.5 MG PO TABS: 2.5000 mg | ORAL_TABLET | Freq: Every day | ORAL | 1 refills | Status: DC

## 2019-09-14 MED ORDER — ATORVASTATIN CALCIUM 40 MG PO TABS: 40.0000 mg | ORAL_TABLET | Freq: Every day | ORAL | 1 refills | Status: DC

## 2019-09-14 MED ORDER — DAPAGLIFLOZIN PROPANEDIOL 10 MG PO TABS: 10.0000 mg | ORAL_TABLET | Freq: Every day | ORAL | 1 refills | Status: DC

## 2019-09-14 MED ORDER — INSULIN GLARGINE 100 UNIT/ML ~~LOC~~ SOLN: SUBCUTANEOUS | 1 refills | Status: DC

## 2019-09-14 MED ORDER — GLIPIZIDE 10 MG PO TABS: 10.0000 mg | ORAL_TABLET | Freq: Two times a day (BID) | ORAL | 1 refills | Status: DC

## 2019-09-14 MED ORDER — METFORMIN HCL 1000 MG PO TABS: 1000.0000 mg | ORAL_TABLET | Freq: Two times a day (BID) | ORAL | 1 refills | Status: DC

## 2019-09-14 MED FILL — metFORMIN HCL 1000 MG TABS: 1000 | 30 days supply | Qty: 60 | Fill #0

## 2019-09-14 MED FILL — glipiZIDE 10 MG TABS: 10 | 30 days supply | Qty: 60 | Fill #0

## 2019-09-14 MED FILL — LISINOPRIL 2.5 MG TABLET: 2.5 | 30 days supply | Qty: 30 | Fill #0

## 2019-09-14 MED FILL — FARXIGA 10 MG TABLET: 10 | 30 days supply | Qty: 30 | Fill #0

## 2019-09-14 MED FILL — ATORVASTATIN CALCIUM 40 MG: 40 | 30 days supply | Qty: 30 | Fill #0

## 2019-09-14 MED FILL — $LANTUS 100 UNITS/ML VIAL: 100 | 33 days supply | Qty: 30 | Fill #0

## 2019-09-14 NOTE — Patient Instructions (Signed)

## 2019-09-14 NOTE — Progress Notes (Signed)
Subjective:  Patient ID: Audrey Mcmillan, female    DOB: 02-08-1980  Age: 39 y.o. MRN: [id number]  CC: Diabetes and Hypertension   HPI Audrey Mcmillan is a 39 year old female with a history of Type 2 Dm (A1c 9.9) , Hyperlipidemia, Hypothyroidism,HTN here for chronic disease management. She endorses compliance with her Diabetic medications but A1C is 9.9 with no much difference compared to 10.0, three months ago. Denies presence of neuropathy, visual concerns or hypoglycemic symptoms. Tolerating her statin and antihypertensive. Doing well on Levothyroxine. She has no additional concerns today.  Past Medical History:  Diagnosis Date  . Diabetes mellitus without complication ([place])   . High cholesterol     History reviewed. No pertinent surgical history.  History reviewed. No pertinent family history.  No Known Allergies  Outpatient Medications Prior to Visit  Medication Sig Dispense Refill  . Blood Glucose Monitoring Suppl (TRUE METRIX METER) w/Device KIT Use as instructed 1 kit 0  . glucose blood (TRUE METRIX BLOOD GLUCOSE TEST) test strip Use as instructed. Check blood glucose levels twice per day. 100 each 12  . Insulin Syringe-Needle U-100 (INSULIN SYRINGE 1CC/30GX5/16") 30G X 5/16" 1 ML MISC Inject 1 application into the skin 2 times daily at 12 noon and 4 pm. 100 each 5  . levothyroxine (SYNTHROID) 150 MCG tablet Take 1 tablet (150 mcg total) by mouth daily before breakfast. 90 tablet 1  . senna (SENOKOT) 8.6 MG TABS tablet Take 1 tablet (8.6 mg total) by mouth daily. 120 tablet 0  . TRUEplus Lancets 28G MISC Use as instructed. Check blood glucose levels twice per day. 100 each 3  . atorvastatin (LIPITOR) 40 MG tablet Take 1 tablet (40 mg total) by mouth daily. 180 tablet 1  . glipiZIDE (GLUCOTROL) 10 MG tablet Take 1 tablet (10 mg total) by mouth 2 (two) times daily before a meal. 180 tablet 1  . insulin glargine (LANTUS) 100 UNIT/ML injection Inject 60 units  at bedtime and 30 units in the morning. 90 mL 1  . metFORMIN (GLUCOPHAGE) 1000 MG tablet Take 1 tablet (1,000 mg total) by mouth 2 (two) times daily with a meal. 180 tablet 1  . lisinopril (ZESTRIL) 2.5 MG tablet Take 1 tablet (2.5 mg total) by mouth daily. 90 tablet 1   No facility-administered medications prior to visit.     ROS Review of Systems  Constitutional: Negative for activity change, appetite change and fatigue.  HENT: Negative for congestion, sinus pressure and sore throat.   Eyes: Negative for visual disturbance.  Respiratory: Negative for cough, chest tightness, shortness of breath and wheezing.   Cardiovascular: Negative for chest pain and palpitations.  Gastrointestinal: Negative for abdominal distention, abdominal pain and constipation.  Endocrine: Negative for polydipsia.  Genitourinary: Negative for dysuria and frequency.  Musculoskeletal: Negative for arthralgias and back pain.  Skin: Negative for rash.  Neurological: Negative for tremors, light-headedness and numbness.  Hematological: Does not bruise/bleed easily.  Psychiatric/Behavioral: Negative for agitation and behavioral problems.    Objective:  BP 115/72   Pulse 79   Ht 5' 3"  (1.6 m)   Wt 163 lb (73.9 kg)   SpO2 99%   BMI 28.87 kg/m   BP/Weight 09/14/2019 06/12/2019 97/09/4799  Systolic BP 655 374 827  Diastolic BP 72 75 65  Wt. (Lbs) 163 165.6 167.2  BMI 28.87 29.33 29.62      Physical Exam Constitutional:      Appearance: She is well-developed.  Neck:  Vascular: No JVD.  Cardiovascular:     Rate and Rhythm: Normal rate.     Heart sounds: Normal heart sounds. No murmur heard.   Pulmonary:     Effort: Pulmonary effort is normal.     Breath sounds: Normal breath sounds. No wheezing or rales.  Chest:     Chest wall: No tenderness.  Abdominal:     General: Bowel sounds are normal. There is no distension.     Palpations: Abdomen is soft. There is no mass.     Tenderness: There is no  abdominal tenderness.  Musculoskeletal:        General: Normal range of motion.     Right lower leg: No edema.     Left lower leg: No edema.  Neurological:     Mental Status: She is alert and oriented to person, place, and time.  Psychiatric:        Mood and Affect: Mood normal.     CMP Latest Ref Rng & Units 11/28/2018 08/13/2018 11/08/2017  Glucose 65 - 99 mg/dL 78 189(H) 231(H)  BUN 6 - 20 mg/dL 8 4(L) 6  Creatinine 0.57 - 1.00 mg/dL 0.60 0.58 0.61  Sodium 134 - 144 mmol/L 143 142 140  Potassium 3.5 - 5.2 mmol/L 4.4 4.5 4.2  Chloride 96 - 106 mmol/L 107(H) 103 104  CO2 20 - 29 mmol/L 22 26 21   Calcium 8.7 - 10.2 mg/dL 9.3 9.3 9.1  Total Protein 6.0 - 8.5 g/dL 6.8 6.5 -  Total Bilirubin 0.0 - 1.2 mg/dL 0.7 0.3 -  Alkaline Phos 39 - 117 IU/L 77 88 -  AST 0 - 40 IU/L 15 12 -  ALT 0 - 32 IU/L 21 22 -    Lipid Panel     Component Value Date/Time   CHOL 167 03/06/2019 0932   TRIG 123 03/06/2019 0932   HDL 40 03/06/2019 0932   CHOLHDL 4.2 03/06/2019 0932   LDLCALC 105 (H) 03/06/2019 0932    CBC    Component Value Date/Time   WBC 12.3 (H) 11/28/2018 1203   WBC 5.5 01/16/2015 0558   RBC 4.25 11/28/2018 1203   RBC 4.21 01/16/2015 0558   HGB 12.6 11/28/2018 1203   HCT 36.9 11/28/2018 1203   PLT 327 11/28/2018 1203   MCV 87 11/28/2018 1203   MCH 29.6 11/28/2018 1203   MCH 30.2 01/16/2015 0558   MCHC 34.1 11/28/2018 1203   MCHC 34.6 01/16/2015 0558   RDW 12.6 11/28/2018 1203   LYMPHSABS 4.2 (H) 11/28/2018 1203   MONOABS 0.6 09/17/2013 1642   EOSABS 0.1 11/28/2018 1203   BASOSABS 0.0 11/28/2018 1203    Lab Results  Component Value Date   HGBA1C 9.9 (A) 09/14/2019    Assessment & Plan:  1. Type 2 diabetes mellitus with diabetic polyneuropathy, with long-term current use of insulin (HCC) Uncontrolled Farxiga added to regimen Counseled on Diabetic diet, my plate method, 177 minutes of moderate intensity exercise/week Blood sugar logs with fasting goals of 80-120  mg/dl, random of less than 180 and in the event of sugars less than 60 mg/dl or greater than 400 mg/dl encouraged to notify the clinic. Advised on the need for annual eye exams, annual foot exams, Pneumonia vaccine. - POCT glucose (manual entry) - POCT glycosylated hemoglobin (Hb A1C) - CMP14+EGFR - dapagliflozin propanediol (FARXIGA) 10 MG TABS tablet; Take 1 tablet (10 mg total) by mouth daily before breakfast.  Dispense: 90 tablet; Refill: 1 - glipiZIDE (GLUCOTROL) 10 MG tablet;  Take 1 tablet (10 mg total) by mouth 2 (two) times daily before a meal.  Dispense: 180 tablet; Refill: 1 - insulin glargine (LANTUS) 100 UNIT/ML injection; Inject 60 units at bedtime and 30 units in the morning.  Dispense: 90 mL; Refill: 1 - lisinopril (ZESTRIL) 2.5 MG tablet; Take 1 tablet (2.5 mg total) by mouth daily.  Dispense: 90 tablet; Refill: 1 - metFORMIN (GLUCOPHAGE) 1000 MG tablet; Take 1 tablet (1,000 mg total) by mouth 2 (two) times daily with a meal.  Dispense: 180 tablet; Refill: 1 - Microalbumin / creatinine urine ratio  2. Acquired hypothyroidism Controlled Will send off thyroid labs and adjust regimen accordingly - TSH - T4, free  3. Pure Hypercholesterolemia Controlled Low cholesterol diet - atorvastatin (LIPITOR) 40 MG tablet; Take 1 tablet (40 mg total) by mouth daily.  Dispense: 180 tablet; Refill: 1    Meds ordered this encounter  Medications  . dapagliflozin propanediol (FARXIGA) 10 MG TABS tablet    Sig: Take 1 tablet (10 mg total) by mouth daily before breakfast.    Dispense:  90 tablet    Refill:  1  . glipiZIDE (GLUCOTROL) 10 MG tablet    Sig: Take 1 tablet (10 mg total) by mouth 2 (two) times daily before a meal.    Dispense:  180 tablet    Refill:  1  . atorvastatin (LIPITOR) 40 MG tablet    Sig: Take 1 tablet (40 mg total) by mouth daily.    Dispense:  180 tablet    Refill:  1  . insulin glargine (LANTUS) 100 UNIT/ML injection    Sig: Inject 60 units at bedtime and 30  units in the morning.    Dispense:  90 mL    Refill:  1  . lisinopril (ZESTRIL) 2.5 MG tablet    Sig: Take 1 tablet (2.5 mg total) by mouth daily.    Dispense:  90 tablet    Refill:  1  . metFORMIN (GLUCOPHAGE) 1000 MG tablet    Sig: Take 1 tablet (1,000 mg total) by mouth 2 (two) times daily with a meal.    Dispense:  180 tablet    Refill:  1    Follow-up: Return in about 3 months (around 12/15/2019) for Chronic disease management.       Charlott Rakes, MD, FAAFP. [facility] [place], [place]   09/14/2019, 9:09 AM

## 2019-09-15 ENCOUNTER — Other Ambulatory Visit (INDEPENDENT_AMBULATORY_CARE_PROVIDER_SITE_OTHER): Payer: Self-pay | Admitting: Family Medicine

## 2019-09-15 DIAGNOSIS — E039 Hypothyroidism, unspecified: Secondary | ICD-10-CM

## 2019-09-15 DIAGNOSIS — E7800 Pure hypercholesterolemia, unspecified: Secondary | ICD-10-CM

## 2019-09-15 LAB — CMP14+EGFR
ALT: 15 IU/L (ref 0–32)
AST: 9 IU/L (ref 0–40)
Albumin/Globulin Ratio: 1.4 (ref 1.2–2.2)
Albumin: 4.2 g/dL (ref 3.8–4.8)
Alkaline Phosphatase: 100 IU/L (ref 48–121)
BUN/Creatinine Ratio: 11 (ref 9–23)
BUN: 8 mg/dL (ref 6–20)
Bilirubin Total: 0.6 mg/dL (ref 0.0–1.2)
CO2: 20 mmol/L (ref 20–29)
Calcium: 10 mg/dL (ref 8.7–10.2)
Chloride: 101 mmol/L (ref 96–106)
Creatinine, Ser: 0.76 mg/dL (ref 0.57–1.00)
GFR calc Af Amer: 116 mL/min/{1.73_m2} (ref >59)
GFR calc non Af Amer: 101 mL/min/{1.73_m2} (ref >59)
Globulin, Total: 2.9 g/dL (ref 1.5–4.5)
Glucose: 219 mg/dL — ABNORMAL HIGH (ref 65–99)
Potassium: 4.6 mmol/L (ref 3.5–5.2)
Sodium: 137 mmol/L (ref 134–144)
Total Protein: 7.1 g/dL (ref 6.0–8.5)

## 2019-09-15 LAB — TSH: TSH: 0.45 u[IU]/mL (ref 0.450–4.500)

## 2019-09-15 LAB — MICROALBUMIN / CREATININE URINE RATIO
Creatinine, Urine: 206.8 mg/dL
Microalb/Creat Ratio: 28 mg/g creat (ref 0–29)
Microalbumin, Urine: 57.9 ug/mL

## 2019-09-15 LAB — T4, FREE: Free T4: 1.48 ng/dL (ref 0.82–1.77)

## 2019-09-15 MED ORDER — LEVOTHYROXINE SODIUM 150 MCG PO TABS: 150.0000 ug | ORAL_TABLET | Freq: Every day | ORAL | 1 refills | Status: DC

## 2019-09-16 MED FILL — LEVOTHYROXINE SODIUM 150 MC: 150 | 90 days supply | Qty: 90 | Fill #0

## 2019-11-13 MED FILL — METFORMIN HCL 1000 MG TABS: 1000 | 30 days supply | Qty: 60 | Fill #1

## 2019-11-13 MED FILL — ATORVASTATIN CALCIUM 40 MG: 40 | 30 days supply | Qty: 60 | Fill #4

## 2019-11-13 MED FILL — $LANTUS 100 UNITS/ML VIAL: 100 | 22 days supply | Qty: 20 | Fill #3

## 2019-12-15 ENCOUNTER — Encounter (INDEPENDENT_AMBULATORY_CARE_PROVIDER_SITE_OTHER): Payer: Self-pay | Admitting: Primary Care

## 2019-12-15 ENCOUNTER — Other Ambulatory Visit: Payer: Self-pay

## 2019-12-15 ENCOUNTER — Ambulatory Visit (INDEPENDENT_AMBULATORY_CARE_PROVIDER_SITE_OTHER): Payer: Self-pay | Admitting: Primary Care

## 2019-12-15 VITALS — BP 121/72 | HR 79 | Temp 97.2°F | Ht 63.0 in | Wt 157.2 lb

## 2019-12-15 DIAGNOSIS — E1142 Type 2 diabetes mellitus with diabetic polyneuropathy: Secondary | ICD-10-CM

## 2019-12-15 DIAGNOSIS — Z794 Long term (current) use of insulin: Secondary | ICD-10-CM

## 2019-12-15 DIAGNOSIS — Z23 Encounter for immunization: Secondary | ICD-10-CM

## 2019-12-15 DIAGNOSIS — E7841 Elevated Lipoprotein(a): Secondary | ICD-10-CM

## 2019-12-15 DIAGNOSIS — E039 Hypothyroidism, unspecified: Secondary | ICD-10-CM

## 2019-12-15 DIAGNOSIS — E119 Type 2 diabetes mellitus without complications: Secondary | ICD-10-CM

## 2019-12-15 DIAGNOSIS — Z3009 Encounter for other general counseling and advice on contraception: Secondary | ICD-10-CM

## 2019-12-15 LAB — GLUCOSE, POCT (MANUAL RESULT ENTRY): POC Glucose: 104 mg/dl — AB (ref 70–99)

## 2019-12-15 LAB — POCT GLYCOSYLATED HEMOGLOBIN (HGB A1C): Hemoglobin A1C: 10.1 % — AB (ref 4.0–5.6)

## 2019-12-15 MED ORDER — LISINOPRIL 2.5 MG PO TABS: 2.5000 mg | ORAL_TABLET | Freq: Every day | ORAL | 1 refills | Status: DC

## 2019-12-15 MED FILL — LISINOPRIL 2.5 MG TABLET: 2.5 | 30 days supply | Qty: 30 | Fill #0

## 2019-12-15 NOTE — Patient Instructions (Addendum)
Gripe en los adultos Influenza, Adult A la gripe tambin se la conoce como "influenza". Es una [employer], la nariz y la garganta (vas respiratorias). La causa un virus. La gripe provoca sntomas que son similares a los de un resfro. Tambin causa fiebre alta y dolores corporales. Se transmite fcilmente de persona a persona (es contagiosa). La mejor manera de prevenir la gripe es aplicndose la vacuna contra la gripe todos los aos. Cules son las causas? La causa de esta afeccin es el virus de la influenza. Puede contraer el virus de las siguientes maneras:  [place] gotitas que estn en el aire y que provienen de la tos o el estornudo de una persona que tiene el virus.  Tocar algo que tiene el virus (est contaminado) y luego tocarse la boca, la nariz o los ojos. Qu incrementa el riesgo? Hay ciertas cosas que lo pueden hacer ms propenso a Nurse, adult. Estas incluyen lo siguiente:  No lavarse las manos con frecuencia.  Tener contacto cercano con [employer] durante la temporada de resfro y gripe.  Tocarse la boca, los ojos o la nariz sin antes lavarse las manos.  No recibir la [employer]. Puede correr un mayor riesgo de tener gripe, junto con problemas graves como una infeccin pulmonar (neumona), si:  Es mayor de 60 aos de edad.  Est embarazada.  Tiene debilitado el sistema que combate las defensas (sistema inmunitario) debido a una enfermedad o porque toma determinados medicamentos.  Tiene una enfermedad prolongada (crnica), por ejemplo: ? Enfermedad cardaca, renal o pulmonar. ? Diabetes. ? Asma.  Tiene un trastorno heptico.  Tiene mucho sobrepeso (obesidad [place]).  Tiene anemia. Esta es una afeccin que afecta a los glbulos rojos. Cules son los signos o los sntomas? Los sntomas normalmente comienzan de repente y [place] [address]. Pueden incluir los siguientes:  Cristy Hilts y [place].  Dolores de  Orin, dolores en el cuerpo o dolores musculares.  Dolor de Investment banker, operational.  Tos.  Secrecin o congestin nasal.  Immunologist.  No desear comer en las cantidades normales (prdida del apetito).  Debilidad o cansancio (fatiga).  Mareos.  Malestar estomacal (nuseas) o ganas de devolver (vmitos). Cmo se trata? Si la gripe se encuentra de forma temprana, se la puede tratar con medicamentos que pueden ayudar a reducir la gravedad de la enfermedad y reducir su duracin (medicamentos antivirales). Estos pueden administrarse por boca (va oral) o por va (catter) intravenosa. Cuidarse en su hogar puede ayudar a que mejoren los sntomas. El mdico puede sugerirle lo siguiente:  Tomar medicamentos de Radio broadcast assistant.  Beber mucho lquido. La gripe suele desaparecer sola. Si tiene sntomas muy graves u otros problemas, puede recibir tratamiento en un hospital. Siga estas indicaciones en su casa:     Actividad  Descanse todo lo que sea necesario. Duerma lo suficiente.  Foy [place] en su casa y no concurra al [place] o a la escuela, como se lo haya indicado el mdico. ? No salga de su casa hasta que no haya tenido fiebre por 24horas sin tomar medicamentos. ? Salga de su casa solo para ir al [employer]. Comida y bebida  Wyatt Haste SRO (solucin de rehidratacin oral). Es Ardelia Mems bebida que se vende en farmacias y tiendas.  Beba suficiente lquido para Contractor pis (la orina) de color amarillo plido.  En la medida en que pueda, beba lquidos claros en pequeas cantidades. Los lquidos transparentes son, por ejemplo: ? Grayce Sessions. ? Trocitos  de hielo. ? Jugo de frutas con agua agregada (jugo de frutas diluido). ? Bebidas deportivas de bajas caloras.  En la medida en que pueda, consuma alimentos blandos y fciles de digerir en pequeas cantidades. Estos alimentos incluyen: ? Bananas. ? Pur de [employer]. ? Arroz. ? [employer]on. ? Tostadas. ? Galletas.  No coma ni beba lo  siguiente: ? Lquidos con alto contenido de azcar o cafena. ? Alcohol. ? Alimentos condimentados o con alto contenido de [place]. Indicaciones generales  [employer] de venta libre y los recetados solamente como se lo haya indicado el mdico.  Use un humidificador de aire fro para que el aire de su casa est ms hmedo. Esto puede facilitar la respiracin.  Al toser o estornudar, cbrase la boca y la [place].  Lvese las manos con agua y jabn frecuentemente, en especial despus de toser o Brewing technologist. Use desinfectante para manos con alcohol si no dispone de [place] y [place].  Concurra a todas las visitas de control como se lo haya indicado el mdico. Esto es importante. Cmo se evita?   Colquese la vacuna antigripal todos los [place]. Puede colocarse la vacuna contra la gripe a fines de verano, en otoo o en invierno. Pregntele al mdico cundo debe aplicarse la vacuna contra la gripe.  Evite el contacto con personas que estn enfermas durante el otoo y el invierno (la temporada de resfro y gripe). Comunquese con un mdico si:  Tiene sntomas nuevos.  Tiene los siguientes sntomas: ? Tourist information centre manager. ? Materia fecal lquida (diarrea). ? Fiebre.  La tos empeora.  Empieza a tener ms mucosidad.  Tiene Higher education careers adviser.  Vomita. Solicite ayuda inmediatamente si:  Le falta el aire.  Tiene dificultad para respirar.  La piel o las uas se ponen de un color azulado.  Presenta dolor muy intenso o rigidez en el cuello.  Tiene dolor de cabeza repentino.  Le duele la cara o el odo de forma repentina.  No puede comer ni beber sin vomitar. Resumen  La gripe es una infeccin en los pulmones, la nariz y Patent examiner. La causa un virus.  Tome los medicamentos de venta libre y los recetados solamente como se lo haya indicado el mdico.  Aplicarse la vacuna contra la gripe todos los aos es la mejor manera de evitar contagiarse la gripe. Esta informacin no tiene  Marine scientist el consejo del mdico. Asegrese de hacerle al mdico cualquier pregunta que tenga. Document Revised: 08/21/2017 Document Reviewed: 08/21/2017 Elsevier Patient Education  [place] mtodo anticonceptivo Contraception Choices La anticoncepcin, o los mtodos anticonceptivos, son medidas que se toman o [place] a fin de intentar no quedar embarazada. Mtodo anticonceptivo hormonal Este tipo de mtodo anticonceptivo contiene hormonas. A continuacin se mencionan algunos tipos de mtodos anticonceptivos hormonales:  Un tubo que se coloca debajo de la piel del brazo (implante). El tubo Product manager durante 3aos.  Inyecciones que debe recibir cada 82meses.  Pldoras que debe tomar [employer] (pldoras anticonceptivas).  Un parche que se debe cambiar 1vez por semana durante 3semanas (parche anticonceptivo). Despus de [employer], el parche se debe retirar durante 1semana.  Un anillo que se coloca en la vagina. El anillo se deja colocado durante 3 semanas. Luego, se debe retirar de Theatre stage manager. Luego, se coloca un nuevo anillo en la vagina.  Pldoras que se deben tomar despus de tener sexo sin proteccin (pldoras anticonceptivas de emergencia). Mtodos anticonceptivos de  barrera A continuacin se mencionan algunos tipos de mtodos anticonceptivos de barrera:  Una cubierta delgada que se coloca sobre el pene antes de tener sexo (preservativo masculino). La cubierta se desecha despus de [employer].  Una cubierta blanda y suelta que se coloca en la vagina antes de tener sexo (preservativo femenino). La cubierta se desecha despus de [employer]ill Lynch.  Un dispositivo de goma que se aplica sobre el cuello uterino (diafragma). Este dispositivo debe fabricarse para usted. Se coloca en la vagina antes de tener sexo. Se debe dejar colocado durante 6 a 8horas despus de [employer]ill Lynch. Se debe retirar en un plazo de  24horas.  Un capuchn pequeo y [employer] se fija sobre el cuello uterino (capuchn cervical). Este capuchn debe fabricarse para usted. Se debe dejar colocado durante 6 a 8horas despus de [employer]ill Lynch. Se debe retirar en un plazo de 48horas.  Una esponja que se coloca en la vagina antes de tener sexo. Se debe dejar colocada durante al menos 6horas despus de [employer]ill Lynch. Se debe retirar en un plazo de 30horas. Luego, debe desecharse.  Una sustancia qumica que destruye o impide que los espermatozoides ingresen al tero (espermicida). Se puede presentar en forma de pldora, crema, gel o espuma y se debe colocar en la vagina. La sustancia qumica se debe usar al [facility] de 10 a 70minutos antes de [employer]ill Lynch. Dispositivo anticonceptivo intrauterino (DIU) El DIU es un pequeo dispositivo plstico en forma de T. Se coloca en el interior del tero. Existen dos tipos diferentes:  DIU hormonal. Este tipo puede permanecer colocado durante 3 a 5aos.  DIU de cobre. Este tipo International aid/development worker colocado durante 10aos. Mtodos anticonceptivos permanentes A continuacin se mencionan algunos tipos de mtodos anticonceptivos permanentes:  [place] para obstruir las trompas de [place].  Colocacin de un dispositivo en cada una de las trompas de [place].  [place] para atar los conductos que transportan el esperma (vasectoma). Mtodos anticonceptivos por planificacin natural A continuacin se mencionan algunos mtodos anticonceptivos por planificacin natural:  No tener [employer]'s frtiles de la [place].  Usar un calendario a fin de: ? Llevar un registro de la duracin de cada perodo. ? Determinar en [employer] se podra producir Water quality scientist. ? Planificar no tener [employer] en que se podra producir Water quality scientist.  Reconocer los sntomas de la ovulacin y no tener sexo durante la ovulacin. Kallie Edward en que la mujer puede detectar la ovulacin es controlarse la temperatura.  Esperar para  tener sexo hasta despus de la ovulacin. Resumen  La anticoncepcin, o los mtodos anticonceptivos, son medidas que se toman o [place] a fin de intentar no quedar [place].  Los mtodos anticonceptivos hormonales incluyen implantes, inyecciones, pldoras, parches, anillos vaginales y pldoras anticonceptivas de Freight forwarder.  Los mtodos anticonceptivos de barrera pueden incluir preservativos masculinos, preservativos femeninos, diafragmas, capuchones cervicales, esponjas y espermicidas.  [employer] tipos diferentes de DIU (dispositivo intrauterino) anticonceptivo. Un DIU puede colocarse en el tero de una mujer para evitar el embarazo durante 3 a 5 aos.  La esterilizacin permanente puede realizarse mediante un procedimiento para hombres, mujeres o ambos.  Los mtodos anticonceptivos por Marine scientist natural incluyen no tener [employer] frtiles de la [place]. Esta informacin no tiene Marine scientist el consejo del mdico. Asegrese de hacerle al mdico cualquier pregunta que tenga. Document Revised: 09/11/2017 Document Reviewed: 08/29/2016 Elsevier Patient Education  [place].

## 2019-12-15 NOTE — Progress Notes (Signed)
Established Patient Office Visit  Subjective:  Patient ID: Audrey Mcmillan, female    DOB: Jun 04, 1980  Age: 39 y.o. MRN: [id number]  CC:  Chief Complaint  Patient presents with  . Diabetes    HPI Audrey Mcmillan is a 39 year old Hispanic female who speaks Spanish and English interpreter used [place] [phone]  presents for management of type 2 diabetes she denies polyuria, polyphagia, and polydipsia. Bp well control on ACE for renal protection  Past Medical History:  Diagnosis Date  . Diabetes mellitus without complication ([place])   . High cholesterol     No past surgical history on file.  No family history on file.  Social History   Socioeconomic History  . Marital status: Single    Spouse name: Not on file  . Number of children: Not on file  . Years of education: Not on file  . Highest education level: Not on file  Occupational History  . Not on file  Tobacco Use  . Smoking status: Never Smoker  . Smokeless tobacco: Never Used  Vaping Use  . Vaping Use: Never used  Substance and Sexual Activity  . Alcohol use: No    Comment: occ  . Drug use: No  . Sexual activity: Yes    Birth control/protection: None  Other Topics Concern  . Not on file  Social History Narrative   ** Merged History Encounter **       Social Determinants of Health   Financial Resource Strain:   . Difficulty of Paying Living Expenses: Not on file  Food Insecurity:   . Worried About Charity fundraiser in the Last Year: Not on file  . Ran Out of Food in the Last Year: Not on file  Transportation Needs:   . Lack of Transportation (Medical): Not on file  . Lack of Transportation (Non-Medical): Not on file  Physical Activity:   . Days of Exercise per Week: Not on file  . Minutes of Exercise per Session: Not on file  Stress:   . Feeling of Stress : Not on file  Social Connections:   . Frequency of Communication with Friends and Family: Not on file  . Frequency of Social  Gatherings with Friends and Family: Not on file  . Attends Religious Services: Not on file  . Active Member of Clubs or Organizations: Not on file  . Attends Archivist Meetings: Not on file  . Marital Status: Not on file  Intimate Partner Violence:   . Fear of Current or Ex-Partner: Not on file  . Emotionally Abused: Not on file  . Physically Abused: Not on file  . Sexually Abused: Not on file    Outpatient Medications Prior to Visit  Medication Sig Dispense Refill  . atorvastatin (LIPITOR) 40 MG tablet Take 1 tablet (40 mg total) by mouth daily. 180 tablet 1  . Blood Glucose Monitoring Suppl (TRUE METRIX METER) w/Device KIT Use as instructed 1 kit 0  . dapagliflozin propanediol (FARXIGA) 10 MG TABS tablet Take 1 tablet (10 mg total) by mouth daily before breakfast. 90 tablet 1  . glipiZIDE (GLUCOTROL) 10 MG tablet Take 1 tablet (10 mg total) by mouth 2 (two) times daily before a meal. 180 tablet 1  . glucose blood (TRUE METRIX BLOOD GLUCOSE TEST) test strip Use as instructed. Check blood glucose levels twice per day. 100 each 12  . insulin glargine (LANTUS) 100 UNIT/ML injection Inject 60 units at bedtime and 30 units in the  morning. 90 mL 1  . Insulin Syringe-Needle U-100 (INSULIN SYRINGE 1CC/30GX5/16") 30G X 5/16" 1 ML MISC Inject 1 application into the skin 2 times daily at 12 noon and 4 pm. 100 each 5  . levothyroxine (SYNTHROID) 150 MCG tablet Take 1 tablet (150 mcg total) by mouth daily before breakfast. 90 tablet 1  . metFORMIN (GLUCOPHAGE) 1000 MG tablet Take 1 tablet (1,000 mg total) by mouth 2 (two) times daily with a meal. 180 tablet 1  . TRUEplus Lancets 28G MISC Use as instructed. Check blood glucose levels twice per day. 100 each 3  . lisinopril (ZESTRIL) 2.5 MG tablet Take 1 tablet (2.5 mg total) by mouth daily. 90 tablet 1  . senna (SENOKOT) 8.6 MG TABS tablet Take 1 tablet (8.6 mg total) by mouth daily. 120 tablet 0   No facility-administered medications prior  to visit.    No Known Allergies  ROS Review of Systems  All other systems reviewed and are negative.     Objective:    Physical Exam Vitals reviewed.  Constitutional:      Appearance: Normal appearance.  HENT:     Head: Normocephalic.     Right Ear: Tympanic membrane normal.     Left Ear: Tympanic membrane normal.     Nose: Nose normal.  Eyes:     Extraocular Movements: Extraocular movements intact.     Pupils: Pupils are equal, round, and reactive to light.  Cardiovascular:     Rate and Rhythm: Normal rate and regular rhythm.  Pulmonary:     Effort: Pulmonary effort is normal.     Breath sounds: Normal breath sounds.  Abdominal:     General: Bowel sounds are normal.     Palpations: Abdomen is soft.  Musculoskeletal:        General: Normal range of motion.     Cervical back: Normal range of motion.  Skin:    General: Skin is warm and dry.  Neurological:     Mental Status: She is alert and oriented to person, place, and time.  Psychiatric:        Mood and Affect: Mood normal.        Behavior: Behavior normal.        Thought Content: Thought content normal.     BP 121/72 (BP Location: Right Arm, Patient Position: Sitting, Cuff Size: Normal)   Pulse 79   Temp (!) 97.2 F (36.2 C) (Temporal)   Ht _0  (1.6 m)   Wt 157 lb 3.2 oz (71.3 kg)   SpO2 99%   BMI 27.85 kg/m  Wt Readings from Last 3 Encounters:  12/15/19 157 lb 3.2 oz (71.3 kg)  09/14/19 163 lb (73.9 kg)  06/12/19 165 lb 9.6 oz (75.1 kg)     Health Maintenance Due  Topic Date Due  . OPHTHALMOLOGY EXAM  Never done  . INFLUENZA VACCINE  08/23/2019    There are no preventive care reminders to display for this patient.  Lab Results  Component Value Date   TSH 0.450 09/14/2019   Lab Results  Component Value Date   WBC 12.3 (H) 11/28/2018   HGB 12.6 11/28/2018   HCT 36.9 11/28/2018   MCV 87 11/28/2018   PLT 327 11/28/2018   Lab Results  Component Value Date   NA 137 09/14/2019   K  4.6 09/14/2019   CO2 20 09/14/2019   GLUCOSE 219 (H) 09/14/2019   BUN 8 09/14/2019   CREATININE 0.76 09/14/2019   BILITOT 0.6  09/14/2019   ALKPHOS 100 09/14/2019   AST 9 09/14/2019   ALT 15 09/14/2019   PROT 7.1 09/14/2019   ALBUMIN 4.2 09/14/2019   CALCIUM 10.0 09/14/2019   ANIONGAP 12 01/16/2015   Lab Results  Component Value Date   CHOL 167 03/06/2019   Lab Results  Component Value Date   HDL 40 03/06/2019   Lab Results  Component Value Date   LDLCALC 105 (H) 03/06/2019   Lab Results  Component Value Date   TRIG 123 03/06/2019   Lab Results  Component Value Date   CHOLHDL 4.2 03/06/2019   Lab Results  Component Value Date   HGBA1C 10.1 (A) 12/15/2019      Assessment & Plan:  Ellenora was seen today for diabetes.  Diagnoses and all orders for this visit:  Type 2 diabetes mellitus with diabetic polyneuropathy, with long-term current use of insulin ([place]) Uncontrolled states taking all medication as directed again discussed diabetic retinopathy leading to blindness, diabetic nephropathy leading to dialysis, decrease in circulation decrease in sores or wound healing which may lead to amputations and increase of heart attack and stroke -     HgB A1c -     Glucose (CBG)  Acquired hypothyroidism Currently on levothyroxine (SYNTHROID) 150 MCG tablet stable on this dosage will check thyroid levels .  -     TSH + free T4  Elevated lipoprotein(a) -     Lipid Panel  Comprehensive diabetic foot examination, type 2 DM, encounter for [facility]) Completed done preventative care and health maintenance   Flu vaccine need Received   Birth control counseling Did not do well with oral contraception , not that interested in depo with may cause weight gain. Refer to health department for more invasive treatments  Follow-up: Return in about 3 months (around 03/16/2020) for ASAP with Lurena Joiner than 3 month follow up with PCP.    Kerin Perna, NP

## 2019-12-16 LAB — TSH+FREE T4
Free T4: 1.19 ng/dL (ref 0.82–1.77)
TSH: 1.72 u[IU]/mL (ref 0.450–4.500)

## 2019-12-16 LAB — LIPID PANEL
Chol/HDL Ratio: 5.8 ratio — ABNORMAL HIGH (ref 0.0–4.4)
Cholesterol, Total: 162 mg/dL (ref 100–199)
HDL: 28 mg/dL — ABNORMAL LOW (ref >39)
LDL Chol Calc (NIH): 109 mg/dL — ABNORMAL HIGH (ref 0–99)
Triglycerides: 140 mg/dL (ref 0–149)
VLDL Cholesterol Cal: 25 mg/dL (ref 5–40)

## 2019-12-22 ENCOUNTER — Other Ambulatory Visit (INDEPENDENT_AMBULATORY_CARE_PROVIDER_SITE_OTHER): Payer: Self-pay | Admitting: Primary Care

## 2019-12-22 DIAGNOSIS — E7841 Elevated Lipoprotein(a): Secondary | ICD-10-CM

## 2019-12-22 MED ORDER — ATORVASTATIN CALCIUM 10 MG PO TABS: 10.0000 mg | ORAL_TABLET | Freq: Every day | ORAL | 3 refills | Status: DC

## 2019-12-22 MED FILL — ?ATORVASTATIN 10 MG TABLET: 10 | 30 days supply | Qty: 30 | Fill #0

## 2019-12-29 ENCOUNTER — Ambulatory Visit: Payer: Self-pay | Admitting: Physician Assistant

## 2019-12-29 ENCOUNTER — Encounter: Payer: Self-pay | Admitting: Pharmacist

## 2019-12-29 ENCOUNTER — Ambulatory Visit: Payer: Self-pay | Attending: Family Medicine | Admitting: Pharmacist

## 2019-12-29 ENCOUNTER — Other Ambulatory Visit: Payer: Self-pay

## 2019-12-29 DIAGNOSIS — E1142 Type 2 diabetes mellitus with diabetic polyneuropathy: Secondary | ICD-10-CM

## 2019-12-29 DIAGNOSIS — Z794 Long term (current) use of insulin: Secondary | ICD-10-CM

## 2019-12-29 LAB — GLUCOSE, POCT (MANUAL RESULT ENTRY): POC Glucose: 99 mg/dl (ref 70–99)

## 2019-12-29 MED FILL — METFORMIN HCL 1000 MG TABS: 1000 | 30 days supply | Qty: 60 | Fill #2

## 2019-12-29 MED FILL — LEVOTHYROXINE SODIUM 150 MC: 150 | 90 days supply | Qty: 90 | Fill #1

## 2019-12-29 NOTE — Progress Notes (Signed)
    S:    PCP: Sharyn Lull   No chief complaint on file.  Patient arrives well and in good spirits.  Presents for diabetes evaluation, education, and management. Patient was referred and last seen by Primary Care Provider, Juluis Mire, on 12/15/2019. Patient's fasting blood sugar of 104 was controlled at that visit. However, A1c remains elevated a 10.1.   Today, patient reports appropriate adherence to medications. Denies episodes of hypoglycemia. Denies symptoms of hyperglycemia.  Family/Social History: nonsmoker  Insurance coverage/medication affordability: Self pay  Medication adherence reported appropriately. Current diabetes medications include: metformin 1g BID, glipizide 10 mg BID, farxiga 10 mg, glargine 60 units daily Current hypertension medications include: lisinopril 2.5 mg  Current hyperlipidemia medications include: atorvastatin 10 mg   Patient denies hypoglycemic events.  Patient reported dietary habits: Eats 3 meals/day  Patient-reported exercise habits: none reported   Patient denies nocturia (nighttime urination).  Patient denies neuropathy (nerve pain). Patient denies visual changes. Patient reports self foot exams.     O:  POCT glucose: 99  Lab Results  Component Value Date   HGBA1C 10.1 (A) 12/15/2019   There were no vitals filed for this visit.  Lipid Panel     Component Value Date/Time   CHOL 162 12/15/2019 0918   TRIG 140 12/15/2019 0918   HDL 28 (L) 12/15/2019 0918   CHOLHDL 5.8 (H) 12/15/2019 0918   LDLCALC 109 (H) 12/15/2019 0918    Home fasting blood sugars: 110s-130s; reports a 350 a couple weeks ago  Does not take post prandial blood sugar    Clinical Atherosclerotic Cardiovascular Disease (ASCVD): No  The ASCVD Risk score Mikey Bussing DC Jr., et al., 2013) failed to calculate for the following reasons:   The 2013 ASCVD risk score is only valid for ages 39 to 33    A/P: Diabetes longstanding, appears currently controlled based on  fasting blood glucose, however, A1c at 10.1 in November. Patient is able to verbalize appropriate hypoglycemia management plan. Medication adherence appears appropriate. Suspecting that post-prandial blood sugars may be elevated due to discrepancy between fasting blood sugars and A1c. Discussed with patient the need to start checking PPD home CBGs. Patient will return with results in one month. If mealtime insulin is needed, we can consider discontinuing glipizide and starting a short-acting insulin three times daily.  -Continue current diabetes regimen  -Extensively discussed pathophysiology of diabetes, recommended lifestyle interventions, dietary effects on blood sugar control -Counseled on s/sx of and management of hypoglycemia -Next A1C anticipated February 2022  ASCVD risk - primary prevention in patient with diabetes. Last LDL is controlled. ASCVD risk score is not >20%  - moderate intensity statin indicated. Aspirin is not indicated.  -Continued atorvastatin 10 mg.   Written patient instructions provided.  Total time in face to face counseling 10 minutes.   Follow up Pharmacist Clinic Visit in one month.   Harriet Pho, PharmD PGY-1 [facility] Pharmacy Resident   12/29/2019 11:06 AM

## 2020-01-05 ENCOUNTER — Other Ambulatory Visit: Payer: Self-pay

## 2020-01-05 ENCOUNTER — Inpatient Hospital Stay (HOSPITAL_COMMUNITY)
Admission: AD | Admit: 2020-01-05 | Discharge: 2020-01-05 | Disposition: A | Discharge status: Home / Self Care | Payer: Self-pay | Attending: Obstetrics and Gynecology | Admitting: Obstetrics and Gynecology

## 2020-01-05 DIAGNOSIS — Z3202 Encounter for pregnancy test, result negative: Secondary | ICD-10-CM | POA: Insufficient documentation

## 2020-01-05 DIAGNOSIS — R109 Unspecified abdominal pain: Secondary | ICD-10-CM | POA: Insufficient documentation

## 2020-01-05 LAB — POCT PREGNANCY, URINE: Preg Test, Ur: NEGATIVE

## 2020-01-05 NOTE — MAU Provider Note (Signed)
   S Ms. Delene Morais is a 39 y.o. No obstetric history on file. patient who presents to MAU today with complaint of abdominal pain that started at 1400. Has not tried medications. Complains of bleeding but no blood on pad. LMP 12/4.   O BP (!) 151/68 (BP Location: Right Arm)   Pulse 81   Temp 98.7 F (37.1 C) (Oral)   Resp 20   Ht 5\' 2"  (1.575 m)   Wt 72.6 kg   LMP 12/26/2019   BMI 29.28 kg/m  Physical Exam Constitutional:      Appearance: She is well-developed.  Cardiovascular:     Rate and Rhythm: Normal rate and regular rhythm.  Pulmonary:     Effort: Pulmonary effort is normal.     Breath sounds: Normal breath sounds.  Skin:    General: Skin is warm and dry.  Neurological:     General: No focal deficit present.     Mental Status: She is alert and oriented to person, place, and time.  Psychiatric:        Mood and Affect: Mood normal.        Behavior: Behavior normal.     A Medical screening exam complete 1. Abdominal pain, unspecified abdominal location  2. Negative pregnancy test     P Discharge from MAU in stable condition Patient given the option of transfer to [facility] for further evaluation or seek care in outpatient facility of choice  List of options for follow-up given  Warning signs for worsening condition that would warrant emergency follow-up discussed   Nicolette Bang, DO 01/05/2020 9:28 PM

## 2020-01-05 NOTE — MAU Note (Signed)
PT SAYS ABD PAIN STARTED AT 2 PM- -NO MEDS.   AT [place] . IN TRIAGE - NO BLOOD ON PAD- YELLOW D/C.   SHE SEES BLOOD WHEN URINATES  AND PAINFUL.   NO BC.  NO HPT .

## 2020-01-23 NOTE — L&D Delivery Note (Signed)
OB/GYN Faculty Practice Delivery Note  Audrey Mcmillan is a 40 y.o. G2P1001 s/p VD at [redacted]w[redacted]d. She was admitted for IOL due to pre-eclampsia with severe features.   ROM: 9h 53m with clear fluid GBS Status: unknown, received PCN   Maximum Maternal Temperature: 98.77F  Labor Progress: Initial SVE: 1/thick/posterior. She then progressed to complete with assistance of cytotec, FB, pit, and AROM.   Delivery Date/Time: 1610 Delivery: Presented to room due to noting a deceleration on FHT. Upon entering the room (with RN, as we were both walking towards the room the FOB came out requesting help), found mom hovering over the toilet holding the infant. Infant blue with quite whimper, code APGAR called. Infant dried and stimulated, cord clamped and cut by provider within 1 minute. No nuchal cord present. Attempted to obtain cord ABG, however unable to collect specimen. Cord blood drawn. Placenta delivered spontaneously with gentle cord traction. Fundus firm with massage and Pitocin. Labia, perineum, vagina, and cervix inspected with a hemostatic left labial tear that did not require repair.  Approximately 15 minutes after placenta delivery, noted patient requested postpartum liletta. Patient consented to try placement despite the delay, performed a bimanual exam with uterus already significantly clamped down. Lower uterine sweep at this time retrieved an additional 100cc of clots (initial EBL was 50). Opted to not proceed with trial of liletta placement, will send message for postpartum IUD with scholarship paperwork.    Baby Weight: pending  Placenta: 3 vessel, intact. Sent to pathology.  Complications: See above  Lacerations: hemostatic left labial that did not require repair  EBL: 150 mL Analgesia: None  Infant:  APGAR (1 MIN): 3   APGAR (5 MINS): 9    Infant left skin to skin after evaluation.   Darrelyn Hillock, DO  OB Family Medicine Fellow, [facility] for [facility], [facility] 12/07/2020, 3:13 AM

## 2020-01-29 ENCOUNTER — Ambulatory Visit: Payer: Self-pay | Admitting: Pharmacist

## 2020-02-04 ENCOUNTER — Emergency Department (HOSPITAL_COMMUNITY): Payer: Self-pay

## 2020-02-04 ENCOUNTER — Encounter (HOSPITAL_COMMUNITY): Payer: Self-pay

## 2020-02-04 DIAGNOSIS — Z79899 Other long term (current) drug therapy: Secondary | ICD-10-CM

## 2020-02-04 DIAGNOSIS — E1165 Type 2 diabetes mellitus with hyperglycemia: Secondary | ICD-10-CM | POA: Diagnosis present

## 2020-02-04 DIAGNOSIS — E872 Acidosis: Secondary | ICD-10-CM | POA: Diagnosis present

## 2020-02-04 DIAGNOSIS — E785 Hyperlipidemia, unspecified: Secondary | ICD-10-CM | POA: Diagnosis present

## 2020-02-04 DIAGNOSIS — Z833 Family history of diabetes mellitus: Secondary | ICD-10-CM

## 2020-02-04 DIAGNOSIS — N12 Tubulo-interstitial nephritis, not specified as acute or chronic: Secondary | ICD-10-CM | POA: Diagnosis present

## 2020-02-04 DIAGNOSIS — Z7984 Long term (current) use of oral hypoglycemic drugs: Secondary | ICD-10-CM

## 2020-02-04 DIAGNOSIS — Z20822 Contact with and (suspected) exposure to covid-19: Secondary | ICD-10-CM | POA: Diagnosis present

## 2020-02-04 DIAGNOSIS — E1142 Type 2 diabetes mellitus with diabetic polyneuropathy: Secondary | ICD-10-CM | POA: Diagnosis present

## 2020-02-04 DIAGNOSIS — E7800 Pure hypercholesterolemia, unspecified: Secondary | ICD-10-CM | POA: Diagnosis present

## 2020-02-04 DIAGNOSIS — Z79890 Hormone replacement therapy (postmenopausal): Secondary | ICD-10-CM

## 2020-02-04 DIAGNOSIS — Z794 Long term (current) use of insulin: Secondary | ICD-10-CM

## 2020-02-04 DIAGNOSIS — Z8342 Family history of familial hypercholesterolemia: Secondary | ICD-10-CM

## 2020-02-04 DIAGNOSIS — R319 Hematuria, unspecified: Secondary | ICD-10-CM | POA: Diagnosis present

## 2020-02-04 DIAGNOSIS — T383X6A Underdosing of insulin and oral hypoglycemic [antidiabetic] drugs, initial encounter: Secondary | ICD-10-CM | POA: Diagnosis present

## 2020-02-04 DIAGNOSIS — E039 Hypothyroidism, unspecified: Secondary | ICD-10-CM | POA: Diagnosis present

## 2020-02-04 DIAGNOSIS — N179 Acute kidney failure, unspecified: Principal | ICD-10-CM | POA: Diagnosis present

## 2020-02-04 NOTE — ED Triage Notes (Signed)
Pt c/o productive cough, fever and body aches x 2 wks. Denies known covid exposure

## 2020-02-05 ENCOUNTER — Inpatient Hospital Stay (HOSPITAL_COMMUNITY): Payer: Self-pay

## 2020-02-05 ENCOUNTER — Emergency Department (HOSPITAL_COMMUNITY): Payer: Self-pay

## 2020-02-05 ENCOUNTER — Inpatient Hospital Stay (HOSPITAL_COMMUNITY)
Admission: EM | Admit: 2020-02-05 | Discharge: 2020-02-09 | DRG: 683 | Disposition: A | Discharge status: Home / Self Care | Payer: Self-pay | Attending: Internal Medicine | Admitting: Internal Medicine

## 2020-02-05 DIAGNOSIS — Z20822 Contact with and (suspected) exposure to covid-19: Secondary | ICD-10-CM

## 2020-02-05 DIAGNOSIS — E1142 Type 2 diabetes mellitus with diabetic polyneuropathy: Secondary | ICD-10-CM

## 2020-02-05 DIAGNOSIS — N179 Acute kidney failure, unspecified: Secondary | ICD-10-CM | POA: Diagnosis present

## 2020-02-05 DIAGNOSIS — E039 Hypothyroidism, unspecified: Secondary | ICD-10-CM | POA: Diagnosis present

## 2020-02-05 DIAGNOSIS — R0602 Shortness of breath: Secondary | ICD-10-CM

## 2020-02-05 DIAGNOSIS — D72829 Elevated white blood cell count, unspecified: Secondary | ICD-10-CM

## 2020-02-05 DIAGNOSIS — R6889 Other general symptoms and signs: Secondary | ICD-10-CM

## 2020-02-05 DIAGNOSIS — Z794 Long term (current) use of insulin: Secondary | ICD-10-CM

## 2020-02-05 DIAGNOSIS — N390 Urinary tract infection, site not specified: Secondary | ICD-10-CM

## 2020-02-05 LAB — COMPREHENSIVE METABOLIC PANEL
ALT: 17 U/L (ref 0–44)
AST: 17 U/L (ref 15–41)
Albumin: 2.5 g/dL — ABNORMAL LOW (ref 3.5–5.0)
Alkaline Phosphatase: 167 U/L — ABNORMAL HIGH (ref 38–126)
Anion gap: 11 (ref 5–15)
BUN: 44 mg/dL — ABNORMAL HIGH (ref 6–20)
CO2: 23 mmol/L (ref 22–32)
Calcium: 8.9 mg/dL (ref 8.9–10.3)
Chloride: 98 mmol/L (ref 98–111)
Creatinine, Ser: 4.61 mg/dL — ABNORMAL HIGH (ref 0.44–1.00)
GFR, Estimated: 12 mL/min — ABNORMAL LOW (ref >60)
Glucose, Bld: 337 mg/dL — ABNORMAL HIGH (ref 70–99)
Potassium: 5.2 mmol/L — ABNORMAL HIGH (ref 3.5–5.1)
Sodium: 132 mmol/L — ABNORMAL LOW (ref 135–145)
Total Bilirubin: 0.4 mg/dL (ref 0.3–1.2)
Total Protein: 7.8 g/dL (ref 6.5–8.1)

## 2020-02-05 LAB — CBC WITH DIFFERENTIAL/PLATELET
Abs Immature Granulocytes: 1.05 10*3/uL — ABNORMAL HIGH (ref 0.00–0.07)
Basophils Absolute: 0.1 10*3/uL (ref 0.0–0.1)
Basophils Relative: 0 %
Eosinophils Absolute: 0.1 10*3/uL (ref 0.0–0.5)
Eosinophils Relative: 0 %
HCT: 34 % — ABNORMAL LOW (ref 36.0–46.0)
Hemoglobin: 10.8 g/dL — ABNORMAL LOW (ref 12.0–15.0)
Immature Granulocytes: 4 %
Lymphocytes Relative: 6 %
Lymphs Abs: 1.5 10*3/uL (ref 0.7–4.0)
MCH: 28.1 pg (ref 26.0–34.0)
MCHC: 31.8 g/dL (ref 30.0–36.0)
MCV: 88.5 fL (ref 80.0–100.0)
Monocytes Absolute: 0.7 10*3/uL (ref 0.1–1.0)
Monocytes Relative: 3 %
Neutro Abs: 22.1 10*3/uL — ABNORMAL HIGH (ref 1.7–7.7)
Neutrophils Relative %: 87 %
Platelets: 338 10*3/uL (ref 150–400)
RBC: 3.84 MIL/uL — ABNORMAL LOW (ref 3.87–5.11)
RDW: 14.1 % (ref 11.5–15.5)
WBC: 25.4 10*3/uL — ABNORMAL HIGH (ref 4.0–10.5)
nRBC: 0 % (ref 0.0–0.2)

## 2020-02-05 LAB — BLOOD GAS, VENOUS
Acid-base deficit: 5 mmol/L — ABNORMAL HIGH (ref 0.0–2.0)
Bicarbonate: 20.7 mmol/L (ref 20.0–28.0)
FIO2: 21
O2 Saturation: 81.2 %
Patient temperature: 37
pCO2, Ven: 43.6 mmHg — ABNORMAL LOW (ref 44.0–60.0)
pH, Ven: 7.297 (ref 7.250–7.430)
pO2, Ven: 55.5 mmHg — ABNORMAL HIGH (ref 32.0–45.0)

## 2020-02-05 LAB — URINALYSIS, ROUTINE W REFLEX MICROSCOPIC
Bilirubin Urine: NEGATIVE
Glucose, UA: >=500 mg/dL — AB
Ketones, ur: NEGATIVE mg/dL
Nitrite: POSITIVE — AB
Protein, ur: NEGATIVE mg/dL
Specific Gravity, Urine: 1.007 (ref 1.005–1.030)
WBC, UA: >50 WBC/hpf — ABNORMAL HIGH (ref 0–5)
pH: 5 (ref 5.0–8.0)

## 2020-02-05 LAB — CBG MONITORING, ED
Glucose-Capillary: 289 mg/dL — ABNORMAL HIGH (ref 70–99)
Glucose-Capillary: 126 mg/dL — ABNORMAL HIGH (ref 70–99)
Glucose-Capillary: 120 mg/dL — ABNORMAL HIGH (ref 70–99)

## 2020-02-05 LAB — GLUCOSE, CAPILLARY
Glucose-Capillary: 178 mg/dL — ABNORMAL HIGH (ref 70–99)
Glucose-Capillary: 172 mg/dL — ABNORMAL HIGH (ref 70–99)

## 2020-02-05 LAB — SARS CORONAVIRUS 2 (TAT 6-24 HRS): SARS Coronavirus 2: NEGATIVE

## 2020-02-05 LAB — HEMOGLOBIN A1C
Hgb A1c MFr Bld: 10.2 % — ABNORMAL HIGH (ref 4.8–5.6)
Mean Plasma Glucose: 246.04 mg/dL

## 2020-02-05 LAB — I-STAT BETA HCG BLOOD, ED (MC, WL, AP ONLY): I-stat hCG, quantitative: <5 m[IU]/mL (ref <5)

## 2020-02-05 MED ORDER — ONDANSETRON HCL 4 MG/2ML IJ SOLN
4.0000 mg | Freq: Four times a day (QID) | INTRAMUSCULAR | Status: DC | PRN
  Administered 2020-02-06: 4 mg via INTRAVENOUS
  Filled 2020-02-05: qty 2

## 2020-02-05 MED ORDER — INSULIN ASPART 100 UNIT/ML IV SOLN
10.0000 [IU] | Freq: Once | INTRAVENOUS | Status: AC
  Administered 2020-02-05: 10 [IU] via INTRAVENOUS
  Filled 2020-02-05: qty 0.1

## 2020-02-05 MED ORDER — SODIUM CHLORIDE 0.9 % IV SOLN: INTRAVENOUS | Status: AC

## 2020-02-05 MED ORDER — INSULIN ASPART 100 UNIT/ML ~~LOC~~ SOLN
0.0000 [IU] | Freq: Every day | SUBCUTANEOUS | Status: DC
  Administered 2020-02-06: 2 [IU] via SUBCUTANEOUS
  Filled 2020-02-05: qty 0.05

## 2020-02-05 MED ORDER — SODIUM CHLORIDE 0.9 % IV SOLN
1.0000 g | INTRAVENOUS | Status: DC
  Administered 2020-02-05: 1 g via INTRAVENOUS
  Filled 2020-02-05: qty 10

## 2020-02-05 MED ORDER — LEVOTHYROXINE SODIUM 75 MCG PO TABS
150.0000 ug | ORAL_TABLET | Freq: Every day | ORAL | Status: DC
  Administered 2020-02-06 – 2020-02-09 (×4): 150 ug via ORAL
  Filled 2020-02-05 (×4): qty 2

## 2020-02-05 MED ORDER — METOPROLOL TARTRATE 5 MG/5ML IV SOLN: 5.0000 mg | Freq: Four times a day (QID) | INTRAVENOUS | Status: DC | PRN

## 2020-02-05 MED ORDER — SODIUM CHLORIDE 0.9 % IV BOLUS
1000.0000 mL | Freq: Once | INTRAVENOUS | Status: AC
Start: 2020-02-05 — End: 2020-02-05
  Administered 2020-02-05: 1000 mL via INTRAVENOUS

## 2020-02-05 MED ORDER — ACETAMINOPHEN 650 MG RE SUPP: 650.0000 mg | Freq: Four times a day (QID) | RECTAL | Status: DC | PRN

## 2020-02-05 MED ORDER — ONDANSETRON HCL 4 MG/2ML IJ SOLN
4.0000 mg | Freq: Once | INTRAMUSCULAR | Status: AC
  Administered 2020-02-05: 4 mg via INTRAVENOUS
  Filled 2020-02-05: qty 2

## 2020-02-05 MED ORDER — ATORVASTATIN CALCIUM 10 MG PO TABS
10.0000 mg | ORAL_TABLET | Freq: Every day | ORAL | Status: DC
Start: 2020-02-05 — End: 2020-02-09
  Administered 2020-02-05 – 2020-02-09 (×5): 10 mg via ORAL
  Filled 2020-02-05 (×5): qty 1

## 2020-02-05 MED ORDER — ACETAMINOPHEN 325 MG PO TABS
650.0000 mg | ORAL_TABLET | Freq: Four times a day (QID) | ORAL | Status: DC | PRN
  Administered 2020-02-07 – 2020-02-09 (×4): 650 mg via ORAL
  Filled 2020-02-05 (×4): qty 2

## 2020-02-05 MED ORDER — POLYETHYLENE GLYCOL 3350 17 G PO PACK
17.0000 g | PACK | Freq: Every day | ORAL | Status: DC | PRN
  Administered 2020-02-07 – 2020-02-08 (×2): 17 g via ORAL
  Filled 2020-02-05 (×2): qty 1

## 2020-02-05 MED ORDER — HEPARIN SODIUM (PORCINE) 5000 UNIT/ML IJ SOLN
5000.0000 [IU] | Freq: Three times a day (TID) | INTRAMUSCULAR | Status: DC
  Administered 2020-02-05 – 2020-02-09 (×12): 5000 [IU] via SUBCUTANEOUS
  Filled 2020-02-05 (×12): qty 1

## 2020-02-05 MED ORDER — INSULIN ASPART 100 UNIT/ML ~~LOC~~ SOLN
0.0000 [IU] | Freq: Three times a day (TID) | SUBCUTANEOUS | Status: DC
  Administered 2020-02-05: 17:00:00 2 [IU] via SUBCUTANEOUS
  Administered 2020-02-06: 5 [IU] via SUBCUTANEOUS
  Administered 2020-02-06: 09:00:00 2 [IU] via SUBCUTANEOUS
  Administered 2020-02-06: 3 [IU] via SUBCUTANEOUS
  Filled 2020-02-05: qty 0.09

## 2020-02-05 MED ORDER — ONDANSETRON HCL 4 MG PO TABS: 4.0000 mg | ORAL_TABLET | Freq: Four times a day (QID) | ORAL | Status: DC | PRN

## 2020-02-05 NOTE — ED Notes (Signed)
ED TO INPATIENT HANDOFF REPORT  ED Nurse Name and Phone #: [phone]  S Name/Age/Gender Audrey Mcmillan 40 y.o. female Room/Bed: WA10/WA10  Code Status   Code Status: Full Code  Home/SNF/Other Home Patient oriented to: self, place, time and situation Is this baseline? Yes   Triage Complete: Triage complete  Chief Complaint AKI (acute kidney injury) (HCC) [N17.9]  Triage Note Pt c/o productive cough, fever and body aches x 2 wks. Denies known covid exposure    Allergies No Known Allergies  Level of Care/Admitting Diagnosis ED Disposition    ED Disposition Condition Comment   Admit  [facility] [[id number]]  Level of Care: Med-Surg [16]  [facility] to [facility] or [facility] if equivalent level of care is available:: Yes  Covid Evaluation: Symptomatic Person Under Investigation (PUI)  Diagnosis: AKI (acute kidney injury) [facility](HCC) [[id number]]) [[id number]]  Admitting Physician: Jae DireSEGAL, JARED E [[id number]]  Attending Physician: Jae DireSEGAL, JARED E [[id number]]  Estimated length of stay: past midnight tomorrow  Certification:: I certify this patient will need inpatient services for at least 2 midnights       B Medical/Surgery History Past Medical History:  Diagnosis Date  . Diabetes mellitus without complication (HCC)   . High cholesterol    History reviewed. No pertinent surgical history.   A IV Location/Drains/Wounds Patient Lines/Drains/Airways Status    Active Line/Drains/Airways    Name Placement date Placement time Site Days   Peripheral IV 02/05/20 Right Antecubital 02/05/20  0702  Antecubital  less than 1          Intake/Output Last 24 hours No intake or output data in the 24 hours ending 02/05/20 1420  Labs/Imaging Results for orders placed or performed during the hospital encounter of 02/05/20 (from the past 48 hour(s))  SARS CORONAVIRUS 2 (TAT 6-24 HRS) Nasopharyngeal Nasopharyngeal Swab     Status: None   Collection Time:  02/05/20  6:57 AM   Specimen: Nasopharyngeal Swab  Result Value Ref Range   SARS Coronavirus 2 NEGATIVE NEGATIVE    Comment: (NOTE) SARS-CoV-2 target nucleic acids are NOT DETECTED.  The SARS-CoV-2 RNA is generally detectable in upper and lower respiratory specimens during the acute phase of infection. Negative results do not preclude SARS-CoV-2 infection, do not rule out co-infections with other pathogens, and should not be used as the sole basis for treatment or other patient management decisions. Negative results must be combined with clinical observations, patient history, and epidemiological information. The expected result is Negative.  Fact Sheet for Patients: HairSlick.nohttps://www.fda.gov/media/138098/download  Fact Sheet for Healthcare Providers: quierodirigir.comhttps://www.fda.gov/media/138095/download  This test is not yet approved or cleared by the [place] FDA and  has been authorized for detection and/or diagnosis of SARS-CoV-2 by FDA under an Emergency Use Authorization (EUA). This EUA will remain  in effect (meaning this test can be used) for the duration of the COVID-19 declaration under Se ction 564(b)(1) of the Act, 21 U.S.C. section 360bbb-3(b)(1), unless the authorization is terminated or revoked sooner.  Performed at [facility], [address] [place], [place] [address]   Comprehensive metabolic panel     Status: Abnormal   Collection Time: 02/05/20  6:57 AM  Result Value Ref Range   Sodium 132 (L) 135 - 145 mmol/L   Potassium 5.2 (H) 3.5 - 5.1 mmol/L   Chloride 98 98 - 111 mmol/L   CO2 23 22 - 32 mmol/L   Glucose, Bld 337 (H) 70 - 99 mg/dL  Comment: Glucose reference range applies only to samples taken after fasting for at least 8 hours.   BUN 44 (H) 6 - 20 mg/dL   Creatinine, Ser 4.61 (H) 0.44 - 1.00 mg/dL   Calcium 8.9 8.9 - 10.3 mg/dL   Total Protein 7.8 6.5 - 8.1 g/dL   Albumin 2.5 (L) 3.5 - 5.0 g/dL   AST 17 15 - 41 U/L   ALT 17 0 - 44 U/L   Alkaline  Phosphatase 167 (H) 38 - 126 U/L   Total Bilirubin 0.4 0.3 - 1.2 mg/dL   GFR, Estimated 12 (L) >60 mL/min    Comment: (NOTE) Calculated using the CKD-EPI Creatinine Equation (2021)    Anion gap 11 5 - 15    Comment: Performed at [facility], [place] [address], [place], [place] [address]  CBC with Differential     Status: Abnormal   Collection Time: 02/05/20  6:57 AM  Result Value Ref Range   WBC 25.4 (H) 4.0 - 10.5 K/uL   RBC 3.84 (L) 3.87 - 5.11 MIL/uL   Hemoglobin 10.8 (L) 12.0 - 15.0 g/dL   HCT 34.0 (L) 36.0 - 46.0 %   MCV 88.5 80.0 - 100.0 fL   MCH 28.1 26.0 - 34.0 pg   MCHC 31.8 30.0 - 36.0 g/dL   RDW 14.1 11.5 - 15.5 %   Platelets 338 150 - 400 K/uL   nRBC 0.0 0.0 - 0.2 %   Neutrophils Relative % 87 %   Neutro Abs 22.1 (H) 1.7 - 7.7 K/uL   Lymphocytes Relative 6 %   Lymphs Abs 1.5 0.7 - 4.0 K/uL   Monocytes Relative 3 %   Monocytes Absolute 0.7 0.1 - 1.0 K/uL   Eosinophils Relative 0 %   Eosinophils Absolute 0.1 0.0 - 0.5 K/uL   Basophils Relative 0 %   Basophils Absolute 0.1 0.0 - 0.1 K/uL   WBC Morphology MILD LEFT SHIFT (1-5% METAS, OCC MYELO, OCC BANDS)    Immature Granulocytes 4 %   Abs Immature Granulocytes 1.05 (H) 0.00 - 0.07 K/uL    Comment: Performed at [facility], [place] [address], [place], [place] [address]  Urinalysis, Routine w reflex microscopic     Status: Abnormal   Collection Time: 02/05/20  6:57 AM  Result Value Ref Range   Color, Urine YELLOW YELLOW   APPearance HAZY (A) CLEAR   Specific Gravity, Urine 1.007 1.005 - 1.030   pH 5.0 5.0 - 8.0   Glucose, UA >=500 (A) NEGATIVE mg/dL   Hgb urine dipstick MODERATE (A) NEGATIVE   Bilirubin Urine NEGATIVE NEGATIVE   Ketones, ur NEGATIVE NEGATIVE mg/dL   Protein, ur NEGATIVE NEGATIVE mg/dL   Nitrite POSITIVE (A) NEGATIVE   Leukocytes,Ua LARGE (A) NEGATIVE   RBC / HPF 6-10 0 - 5 RBC/hpf   WBC, UA >50 (H) 0 - 5 WBC/hpf   Bacteria, UA MANY (A) NONE SEEN   Squamous  Epithelial / LPF 6-10 0 - 5   Mucus PRESENT     Comment: Performed at [facility], [place] [address] [place], [place] [address]  Hemoglobin A1c     Status: Abnormal   Collection Time: 02/05/20  7:07 AM  Result Value Ref Range   Hgb A1c MFr Bld 10.2 (H) 4.8 - 5.6 %    Comment: (NOTE) Pre diabetes:          5.7%-6.4%  Diabetes:              >  6.4%  Glycemic control for   <7.0% adults with diabetes    Mean Plasma Glucose 246.04 mg/dL    Comment: Performed at [place] [address], [place], [place] [address]  I-Stat Beta hCG blood, ED ([facility], [facility], [facility] only)     Status: None   Collection Time: 02/05/20  7:09 AM  Result Value Ref Range   I-stat hCG, quantitative <5.0 <5 mIU/mL   Comment 3            Comment:   GEST. AGE      CONC.  (mIU/mL)   <=1 WEEK        5 - 50     2 WEEKS       50 - 500     3 WEEKS       100 - 10,000     4 WEEKS     1,000 - 30,000        FEMALE AND NON-PREGNANT FEMALE:     LESS THAN 5 mIU/mL   Blood gas, venous (at [facility] and [facility], not at [facility])     Status: Abnormal   Collection Time: 02/05/20  7:24 AM  Result Value Ref Range   FIO2 21.00    pH, Ven 7.297 7.250 - 7.430   pCO2, Ven 43.6 (L) 44.0 - 60.0 mmHg   pO2, Ven 55.5 (H) 32.0 - 45.0 mmHg   Bicarbonate 20.7 20.0 - 28.0 mmol/L   Acid-base deficit 5.0 (H) 0.0 - 2.0 mmol/L   O2 Saturation 81.2 %   Patient temperature 37.0     Comment: Performed at [facility], [place] [address], [place], [place] [address]  POC CBG, ED     Status: Abnormal   Collection Time: 02/05/20  7:26 AM  Result Value Ref Range   Glucose-Capillary 289 (H) 70 - 99 mg/dL    Comment: Glucose reference range applies only to samples taken after fasting for at least 8 hours.  POC CBG, ED     Status: Abnormal   Collection Time: 02/05/20 10:52 AM  Result Value Ref Range   Glucose-Capillary 126 (H) 70 - 99 mg/dL    Comment: Glucose reference range applies only to samples taken after fasting for at least  8 hours.  CBG monitoring, ED     Status: Abnormal   Collection Time: 02/05/20 11:56 AM  Result Value Ref Range   Glucose-Capillary 120 (H) 70 - 99 mg/dL    Comment: Glucose reference range applies only to samples taken after fasting for at least 8 hours.   DG Chest 2 View  Result Date: 02/04/2020 CLINICAL DATA:  Productive cough, fever EXAM: CHEST - 2 VIEW COMPARISON:  None. FINDINGS: Low lung volumes. Linear densities in the lung bases, likely atelectasis. Heart is normal size. No effusions or acute bony abnormality. IMPRESSION: Low lung volumes, bibasilar atelectasis. Electronically Signed   By: Rolm Baptise M.D.   On: 02/04/2020 23:21    Pending Labs Unresulted Labs (From admission, onward)          Start     Ordered   02/06/20 9924  Basic metabolic panel  Daily,   R      02/05/20 1139   02/06/20 0500  CBC  Daily,   R      02/05/20 1139   02/05/20 1140  HIV Antibody (routine testing w rflx)  (HIV Antibody (Routine testing w reflex) panel)  Once,   STAT  02/05/20 1139   02/05/20 1139  Culture, blood (routine x 2)  BLOOD CULTURE X 2,   R (with STAT occurrences)      02/05/20 1138   02/05/20 1132  Influenza panel by PCR (type A & B)  (Influenza PCR Panel)  Add-on,   AD        02/05/20 1131   02/05/20 1118  Culture, Urine  Add-on,   AD        02/05/20 1117          Vitals/Pain Today's Vitals   02/05/20 0852 02/05/20 1015 02/05/20 1040 02/05/20 1131  BP: 131/67 112/74  135/70  Pulse: 92 87  85  Resp: 18 (!) 22  19  Temp:      SpO2: 97% 100%  96%  Weight:      Height:      PainSc:   0-No pain 0-No pain    Isolation Precautions Droplet precaution  Medications Medications  cefTRIAXone (ROCEPHIN) 1 g in sodium chloride 0.9 % 100 mL IVPB (1 g Intravenous New Bag/Given 02/05/20 1131)  atorvastatin (LIPITOR) tablet 10 mg (has no administration in time range)  levothyroxine (SYNTHROID) tablet 150 mcg (has no administration in time range)  acetaminophen (TYLENOL)  tablet 650 mg (has no administration in time range)    Or  acetaminophen (TYLENOL) suppository 650 mg (has no administration in time range)  polyethylene glycol (MIRALAX / GLYCOLAX) packet 17 g (has no administration in time range)  heparin injection 5,000 Units (has no administration in time range)  0.9 %  sodium chloride infusion (has no administration in time range)  ondansetron (ZOFRAN) tablet 4 mg (has no administration in time range)    Or  ondansetron (ZOFRAN) injection 4 mg (has no administration in time range)  metoprolol tartrate (LOPRESSOR) injection 5 mg (has no administration in time range)  insulin aspart (novoLOG) injection 0-5 Units (has no administration in time range)  insulin aspart (novoLOG) injection 0-9 Units (0 Units Subcutaneous Not Given 02/05/20 1208)  sodium chloride 0.9 % bolus 1,000 mL (0 mLs Intravenous Stopped 02/05/20 0931)  ondansetron (ZOFRAN) injection 4 mg (4 mg Intravenous Given 02/05/20 0706)  insulin aspart (novoLOG) injection 10 Units (10 Units Intravenous Given 02/05/20 0851)    Mobility walks Low fall risk   Focused Assessments Renal Assessment Handoff:         R Recommendations: See Admitting Provider Note  Report given to:   Additional Notes:

## 2020-02-05 NOTE — ED Provider Notes (Signed)
[facility] DEPT Provider Note   CSN: [id number] Arrival date & time: 02/04/20  2238     History Chief Complaint  Patient presents with  . Cough  . Generalized Body Aches    Audrey Mcmillan is a 40 y.o. female with PMHx high cholesterol and IDDM who presents to the ED today with complaint of gradual onset, constant, productive cough x 2 weeks. Pt also complains of subjective fevers, body aches, nausea, NBNB emesis, occasional diarrhea, and shortness of breath. She denies any recent sick contacts - has received J&J vaccine 07/09/19. Pt states she has not been taking her insulin for the past 2 days and has not been checking her blood sugar levels. She states anytime she tries to eat or drink she vomits. Pt also mentions that her menstrual cycle is late - LNMP per patient 12/04. She has diffuse abdominal pain however attributes this to her emesis. No vaginal bleeding or discharge or pelvic pain.   The history is provided by the patient and medical records.       Past Medical History:  Diagnosis Date  . Diabetes mellitus without complication ([place])   . High cholesterol     Patient Active Problem List   Diagnosis Date Noted  . Type 2 diabetes mellitus with diabetic polyneuropathy, with long-term current use of insulin ([place]) 06/27/2017  . Elevated lipoprotein(a) 03/27/2017  . Acquired hypothyroidism 03/27/2017    History reviewed. No pertinent surgical history.   OB History   No obstetric history on file.     History reviewed. No pertinent family history.  Social History   Tobacco Use  . Smoking status: Never Smoker  . Smokeless tobacco: Never Used  Vaping Use  . Vaping Use: Never used  Substance Use Topics  . Alcohol use: No    Comment: occ  . Drug use: No    Home Medications Prior to Admission medications   Medication Sig Start Date End Date Taking? Authorizing Provider  atorvastatin (LIPITOR) 10 MG tablet Take 1 tablet (10  mg total) by mouth daily. 12/22/19   Kerin Perna, NP  Blood Glucose Monitoring Suppl (TRUE METRIX METER) w/Device KIT Use as instructed 02/19/18   Gildardo Pounds, NP  dapagliflozin propanediol (FARXIGA) 10 MG TABS tablet Take 1 tablet (10 mg total) by mouth daily before breakfast. 09/14/19   Charlott Rakes, MD  glipiZIDE (GLUCOTROL) 10 MG tablet Take 1 tablet (10 mg total) by mouth 2 (two) times daily before a meal. 09/14/19   Newlin, Enobong, MD  glucose blood (TRUE METRIX BLOOD GLUCOSE TEST) test strip Use as instructed. Check blood glucose levels twice per day. 02/19/18   Gildardo Pounds, NP  insulin glargine (LANTUS) 100 UNIT/ML injection Inject 60 units at bedtime and 30 units in the morning. 09/14/19   Charlott Rakes, MD  Insulin Syringe-Needle U-100 (INSULIN SYRINGE 1CC/30GX5/16") 30G X 5/16" 1 ML MISC Inject 1 application into the skin 2 times daily at 12 noon and 4 pm. 03/06/19   Kerin Perna, NP  levothyroxine (SYNTHROID) 150 MCG tablet Take 1 tablet (150 mcg total) by mouth daily before breakfast. 09/15/19   Charlott Rakes, MD  lisinopril (ZESTRIL) 2.5 MG tablet Take 1 tablet (2.5 mg total) by mouth daily. 12/15/19   Kerin Perna, NP  metFORMIN (GLUCOPHAGE) 1000 MG tablet Take 1 tablet (1,000 mg total) by mouth 2 (two) times daily with a meal. 09/14/19   Charlott Rakes, MD  TRUEplus Lancets 28G MISC Use as  instructed. Check blood glucose levels twice per day. 11/28/18   Kerin Perna, NP    Allergies    Patient has no known allergies.  Review of Systems   Review of Systems  Constitutional: Positive for chills, fatigue and fever (subjective).  Respiratory: Positive for cough and shortness of breath.   Cardiovascular: Negative for chest pain, palpitations and leg swelling.  Gastrointestinal: Positive for abdominal pain, diarrhea, nausea and vomiting.  Genitourinary: Negative for difficulty urinating, dysuria, flank pain and frequency.  Musculoskeletal:  Positive for myalgias.  All other systems reviewed and are negative.   Physical Exam Updated Vital Signs BP (!) 163/88   Pulse 95   Temp 98.4 F (36.9 C)   Resp 18   Ht 5' 2"  (1.575 m)   Wt 73.5 kg   LMP 12/23/2019 (Within Days)   SpO2 100%   BMI 29.63 kg/m   Physical Exam Vitals and nursing note reviewed.  Constitutional:      Appearance: She is ill-appearing. She is not diaphoretic.  HENT:     Head: Normocephalic and atraumatic.     Mouth/Throat:     Mouth: Mucous membranes are dry.  Eyes:     Conjunctiva/sclera: Conjunctivae normal.  Cardiovascular:     Rate and Rhythm: Normal rate and regular rhythm.     Pulses: Normal pulses.  Pulmonary:     Effort: Pulmonary effort is normal.     Breath sounds: Normal breath sounds.     Comments: Actively coughing in the room. Speaking in short sentences between coughing fits. Satting 100% on RA. LCTAB.  Abdominal:     Palpations: Abdomen is soft.     Tenderness: There is abdominal tenderness. There is no right CVA tenderness, left CVA tenderness, guarding or rebound.     Comments: Soft, diffuse mild abdominal TTP, +BS throughout, no r/g/r, neg murphy's, neg mcburney's, no CVA TTP  Musculoskeletal:     Cervical back: Neck supple.  Skin:    General: Skin is warm and dry.  Neurological:     Mental Status: She is alert.     ED Results / Procedures / Treatments   Labs (all labs ordered are listed, but only abnormal results are displayed) Labs Reviewed  COMPREHENSIVE METABOLIC PANEL - Abnormal; Notable for the following components:      Result Value   Sodium 132 (*)    Potassium 5.2 (*)    Glucose, Bld 337 (*)    BUN 44 (*)    Creatinine, Ser 4.61 (*)    Albumin 2.5 (*)    Alkaline Phosphatase 167 (*)    GFR, Estimated 12 (*)    All other components within normal limits  CBC WITH DIFFERENTIAL/PLATELET - Abnormal; Notable for the following components:   WBC 25.4 (*)    RBC 3.84 (*)    Hemoglobin 10.8 (*)    HCT 34.0  (*)    Neutro Abs 22.1 (*)    Abs Immature Granulocytes 1.05 (*)    All other components within normal limits  BLOOD GAS, VENOUS - Abnormal; Notable for the following components:   pCO2, Ven 43.6 (*)    pO2, Ven 55.5 (*)    Acid-base deficit 5.0 (*)    All other components within normal limits  CBG MONITORING, ED - Abnormal; Notable for the following components:   Glucose-Capillary 289 (*)    All other components within normal limits  SARS CORONAVIRUS 2 (TAT 6-24 HRS)  URINALYSIS, ROUTINE W REFLEX MICROSCOPIC  I-STAT BETA  HCG BLOOD, ED ([facility], [facility], [facility] ONLY)  I-STAT VENOUS BLOOD GAS, ED  CBG MONITORING, ED    EKG EKG Interpretation  Date/Time:  Friday February 05 2020 07:27:06 EST Ventricular Rate:  99 PR Interval:    QRS Duration: 80 QT Interval:  310 QTC Calculation: 398 R Axis:   61 Text Interpretation: Sinus rhythm No previous ECGs available Confirmed by Ripley Fraise [phone]) on 02/05/2020 7:37:38 AM   Radiology DG Chest 2 View  Result Date: 02/04/2020 CLINICAL DATA:  Productive cough, fever EXAM: CHEST - 2 VIEW COMPARISON:  None. FINDINGS: Low lung volumes. Linear densities in the lung bases, likely atelectasis. Heart is normal size. No effusions or acute bony abnormality. IMPRESSION: Low lung volumes, bibasilar atelectasis. Electronically Signed   By: Rolm Baptise M.D.   On: 02/04/2020 23:21    Procedures Procedures (including critical care time)  Medications Ordered in ED Medications  sodium chloride 0.9 % bolus 1,000 mL (0 mLs Intravenous Stopped 02/05/20 0931)  ondansetron (ZOFRAN) injection 4 mg (4 mg Intravenous Given 02/05/20 0706)  insulin aspart (novoLOG) injection 10 Units (10 Units Intravenous Given 02/05/20 0851)    ED Course  I have reviewed the triage vital signs and the nursing notes.  Pertinent labs & imaging results that were available during my care of the patient were reviewed by me and considered in my medical decision making (see chart for  details).  Clinical Course as of 02/05/20 1041  Fri Feb 05, 2020  0716 WBC(!): 25.4 [MV]  0727 I-stat hCG, quantitative: <5.0 [MV]  1025 Creatinine(!): 4.61 [MV]  0751 Potassium(!): 5.2 [MV]    Clinical Course User Index [MV] Eustaquio Maize, PA-C   MDM Rules/Calculators/A&P                          41 year old female who presents to the ED today with complaints of a productive cough, body aches, subjective fevers for the past 2 weeks.  She is vaccinated with J&J vaccine in July and denies any recent sick contacts.  On arrival to the ED vitals are stable.  Patient is afebrile, nontachycardic and nontachypneic.  She had a chest x-ray done while in the waiting room which did not show any acute findings.  When she is brought back to her room she mentions she is also having nausea, nonbloody nonbilious emesis, diarrhea and has not been taking her insulin for the past 2 days.  She also mentions she is late on her menstrual cycle, last normal menstrual period 12/04.  On exam patient has diffuse abdominal tenderness palpation however very mild without rebound or guarding.  She attributes this to excessive vomiting.  She denies any pelvic pain.  She is dry on exam.  She is actively coughing in the room however able to speak in short sentences between coughing spells.  Satting 100% on room air and lungs are clear to auscultation bilaterally.  Strong suspicion for COVID at this time given constellation of symptoms however given patient is a diabetic is not taking her insulin for the past 2 days we will check CBG, screening labs including CBC and CMP with VBG.  We will also obtain pregnancy test at this time.  Will provide fluids and Zofran.  Will obtain EKG given complaint of shortness of breath and plan to ambulate patient in the ED to check pulse ox. 6-24 hour TAT COVID test ordered.   CBC with leukocytosis 25.4. Hgb stable at 10.8. Pt nontoxic appearing -  question dehydration causing leukocytosis.  CMP  with creatinine 4.61 and BUN 44 with potassium 5.2. CBG 337, bicarb normal at 23 and no gap. Will obtain renal ultrasound at this time given new AKI and will consult nephrology. Insulin provided.   Lab Results  Component Value Date   CREATININE 4.61 (H) 02/05/2020   CREATININE 0.76 09/14/2019   CREATININE 0.60 11/28/2018   CBG with normal pH at 7.297; not consistent with DKA.   Discussed case with nephrologist Dr. Jonnie Finner who recommends renal ultrasound, U/A, and admit. Can be reconsulted in a couple of days if kidney function does not improve.   Discussed case with [facility] Dr. Neysa Bonito who agrees to evaluate patient for admission. Still awaiting renal ultrasound and U/A.   U/A positive for leuks and nitrites with > 50 WBCs per HPF. Rocephin ordered by hospitalist.   This note was prepared using Dragon voice recognition software and may include unintentional dictation errors due to the inherent limitations of voice recognition software.  Starlynn Klinkner was evaluated in Emergency Department on 02/05/2020 for the symptoms described in the history of present illness. She was evaluated in the context of the global COVID-19 pandemic, which necessitated consideration that the patient might be at risk for infection with the SARS-CoV-2 virus that causes COVID-19. Institutional protocols and algorithms that pertain to the evaluation of patients at risk for COVID-19 are in a state of rapid change based on information released by regulatory bodies including the CDC and federal and state organizations. These policies and algorithms were followed during the patient's care in the ED.   Final Clinical Impression(s) / ED Diagnoses Final diagnoses:  AKI (acute kidney injury) ([place])  Person under investigation for COVID-19    Rx / DC Orders ED Discharge Orders    None       Eustaquio Maize, PA-C 02/05/20 1141    Fredia Sorrow, MD 02/11/20 1757

## 2020-02-05 NOTE — H&P (Signed)
History and Physical        Hospital Admission Note Date: 02/05/2020  Patient name: Audrey Mcmillan Medical record number: [id number] Date of birth: 1980/09/28 Age: 40 y.o. Gender: female  PCP: Kerin Perna, NP    Chief Complaint    Chief Complaint  Patient presents with  . Cough  . Generalized Body Aches      HPI:   This is a 40 year old female who was vaccinated against COVID-19 with J&J with past medical history of hyperlipidemia, diabetes, hypothyroidism who presented to the ED with persistent subjective fever, nausea, vomiting x2 weeks.  Has intermittent shortness of breath as well with cough.  Currently denies myalgias but reported body aches to the ED provider.  Has not been taking her insulin over the past 2 days and has not been checking her blood sugar levels.  Unable to tolerate significant p.o. intake for the past 2 weeks and especially the past few days.  States that 3 weeks ago she noticed blood in her urine which has since stopped and denies dysuria, abdominal pain or urinary frequency.  States that she does have an appetite and is requesting clear liquid diet.  She was also concerned that she was pregnant as she had a late menstrual cycle most recently on 12/4 but was negative for pregnancy and denies any further concern regarding this which was mentioned in the ED.   ED Course: Afebrile, hemodynamically stable, on room air. Notable Labs: Sodium 132, K5.2, glucose 337, BUN 44, creatinine 4.61, AG 11, alkaline phosphatase 167, albumin 2.5, AST 17, ALT 17, WBC 25, Hb 10, platelets 338 pregnancy test negative. Notable Imaging: CXR-low lung volumes bibasilar atelectasis. Patient received 1 L NS bolus, Zofran, NovoLog 10 units x1.  ED provider discussed AKI with Dr. Jonnie Finner, nephrology recommended UA and renal ultrasound  Vitals:   02/05/20 1015 02/05/20 1131   BP: 112/74 135/70  Pulse: 87 85  Resp: (!) 22 19  Temp:    SpO2: 100% 96%     Review of Systems:  Review of Systems  All other systems reviewed and are negative.   Medical/Social/Family History   Past Medical History: Past Medical History:  Diagnosis Date  . Diabetes mellitus without complication ([place])   . High cholesterol     History reviewed. No pertinent surgical history.  Medications: Prior to Admission medications   Medication Sig Start Date End Date Taking? Authorizing Provider  atorvastatin (LIPITOR) 10 MG tablet Take 1 tablet (10 mg total) by mouth daily. 12/22/19   Kerin Perna, NP  Blood Glucose Monitoring Suppl (TRUE METRIX METER) w/Device KIT Use as instructed 02/19/18   Gildardo Pounds, NP  dapagliflozin propanediol (FARXIGA) 10 MG TABS tablet Take 1 tablet (10 mg total) by mouth daily before breakfast. 09/14/19   Charlott Rakes, MD  glipiZIDE (GLUCOTROL) 10 MG tablet Take 1 tablet (10 mg total) by mouth 2 (two) times daily before a meal. 09/14/19   Newlin, Enobong, MD  glucose blood (TRUE METRIX BLOOD GLUCOSE TEST) test strip Use as instructed. Check blood glucose levels twice per day. 02/19/18   Gildardo Pounds, NP  insulin glargine (LANTUS) 100 UNIT/ML injection Inject 60 units at bedtime and 30  units in the morning. 09/14/19   Charlott Rakes, MD  Insulin Syringe-Needle U-100 (INSULIN SYRINGE 1CC/30GX5/16") 30G X 5/16" 1 ML MISC Inject 1 application into the skin 2 times daily at 12 noon and 4 pm. 03/06/19   Kerin Perna, NP  levothyroxine (SYNTHROID) 150 MCG tablet Take 1 tablet (150 mcg total) by mouth daily before breakfast. 09/15/19   Charlott Rakes, MD  lisinopril (ZESTRIL) 2.5 MG tablet Take 1 tablet (2.5 mg total) by mouth daily. 12/15/19   Kerin Perna, NP  metFORMIN (GLUCOPHAGE) 1000 MG tablet Take 1 tablet (1,000 mg total) by mouth 2 (two) times daily with a meal. 09/14/19   Charlott Rakes, MD  TRUEplus Lancets 28G MISC Use as  instructed. Check blood glucose levels twice per day. 11/28/18   Kerin Perna, NP    Allergies:  No Known Allergies  Social History:  reports that she has never smoked. She has never used smokeless tobacco. She reports that she does not drink alcohol and does not use drugs.  Family History: History reviewed. No pertinent family history.   Objective   Physical Exam: Blood pressure 135/70, pulse 85, temperature 98.4 F (36.9 C), resp. rate 19, height 5' 2"  (1.575 m), weight 73.5 kg, last menstrual period 12/23/2019, SpO2 96 %.  Physical Exam Vitals and nursing note reviewed.  Constitutional:      Appearance: She is ill-appearing.  HENT:     Head: Normocephalic.     Mouth/Throat:     Mouth: Mucous membranes are dry.  Eyes:     Conjunctiva/sclera: Conjunctivae normal.  Cardiovascular:     Rate and Rhythm: Normal rate.     Pulses: Normal pulses.  Pulmonary:     Effort: Pulmonary effort is normal. No respiratory distress.  Abdominal:     General: Abdomen is flat.     Palpations: Abdomen is soft.  Musculoskeletal:        General: No swelling or tenderness.  Skin:    General: Skin is warm.     Coloration: Skin is not jaundiced.  Neurological:     Mental Status: She is alert. Mental status is at baseline.  Psychiatric:        Mood and Affect: Mood normal.        Behavior: Behavior normal.     LABS on Admission: I have personally reviewed all the labs and imaging below    Basic Metabolic Panel: Recent Labs  Lab 02/05/20 0657  NA 132*  K 5.2*  CL 98  CO2 23  GLUCOSE 337*  BUN 44*  CREATININE 4.61*  CALCIUM 8.9   Liver Function Tests: Recent Labs  Lab 02/05/20 0657  AST 17  ALT 17  ALKPHOS 167*  BILITOT 0.4  PROT 7.8  ALBUMIN 2.5*   No results for input(s): LIPASE, AMYLASE in the last 168 hours. No results for input(s): AMMONIA in the last 168 hours. CBC: Recent Labs  Lab 02/05/20 0657  WBC 25.4*  NEUTROABS 22.1*  HGB 10.8*  HCT 34.0*   MCV 88.5  PLT 338   Cardiac Enzymes: No results for input(s): CKTOTAL, CKMB, CKMBINDEX, TROPONINI in the last 168 hours. BNP: Invalid input(s): POCBNP CBG: Recent Labs  Lab 02/05/20 0726 02/05/20 1052  GLUCAP 289* 126*    Radiological Exams on Admission:  DG Chest 2 View  Result Date: 02/04/2020 CLINICAL DATA:  Productive cough, fever EXAM: CHEST - 2 VIEW COMPARISON:  None. FINDINGS: Low lung volumes. Linear densities in the lung bases, likely atelectasis.  Heart is normal size. No effusions or acute bony abnormality. IMPRESSION: Low lung volumes, bibasilar atelectasis. Electronically Signed   By: Rolm Baptise M.D.   On: 02/04/2020 23:21      EKG: normal EKG, normal sinus rhythm   A & P   Principal Problem:   AKI (acute kidney injury) ([place]) Active Problems:   Acquired hypothyroidism   Type 2 diabetes mellitus with diabetic polyneuropathy, with long-term current use of insulin (H[facility])   Acute lower UTI   Flu-like symptoms   1. AKI likely secondary to GI losses and poor p.o. intake a. Creatinine 4.61, baseline 0.6 in November 2020 b. Follow-up renal ultrasound c. Hold lisinopril d. Continue IV hydration  2. UTI a. Possibly the etiology of her presenting symptoms b. Afebrile but with leukocytosis c. Start ceftriaxone d. Check blood cultures e. Urine culture  3. Nonspecific flulike symptoms  nausea, vomiting a. COVID-19 swab pending b. Clear liquid diet c. Check flu swab d. Check blood culture  4. Hyperglycemia  Poorly controlled type 2 diabetes a. Not in DKA b. Likely from not taking her insulin for the past several days c. HbA1c 12/15/2019: 10.1 d. Hold lisinopril e. Recheck HbA1c f. Sliding scale insulin, monitor for hypoglycemia in setting of AKI  5. Pseudohyponatremia From hyperglycemia  6. Hypothyroidism a. Continue Synthroid     DVT prophylaxis: Heparin   Code Status: Full Code  Diet: Clear liquid diet Family Communication: Admission,  patients condition and plan of care including tests being ordered have been discussed with the patient who indicates understanding and agrees with the plan and Code Status.  Disposition Plan: The appropriate patient status for this patient is INPATIENT. Inpatient status is judged to be reasonable and necessary in order to provide the required intensity of service to ensure the patient's safety. The patient's presenting symptoms, physical exam findings, and initial radiographic and laboratory data in the context of their chronic comorbidities is felt to place them at high risk for further clinical deterioration. Furthermore, it is not anticipated that the patient will be medically stable for discharge from the hospital within 2 midnights of admission. The following factors support the patient status of inpatient.   " The patient's presenting symptoms include nausea, vomiting, subjective fevers. " The worrisome physical exam findings include appears ill, dry mucous membranes. " The initial radiographic and laboratory data are worrisome because of AKI, UTI. " The chronic co-morbidities include diabetes.   * I certify that at the point of admission it is my clinical judgment that the patient will require inpatient hospital care spanning beyond 2 midnights from the point of admission due to high intensity of service, high risk for further deterioration and high frequency of surveillance required.*   Status is: Inpatient  Remains inpatient appropriate because:IV treatments appropriate due to intensity of illness or inability to take PO and Inpatient level of care appropriate due to severity of illness   Dispo: The patient is from: Home              Anticipated d/c is to: Home              Anticipated d/c date is: 2 days              Patient currently is not medically stable to d/c.        Consultants  . None  Procedures  . None  Time Spent on Admission: 60 minutes    Harold Hedge,  DO [facility]  02/05/2020, 11:45 AM

## 2020-02-05 NOTE — ED Notes (Signed)
[place] at bedside.

## 2020-02-06 DIAGNOSIS — N179 Acute kidney failure, unspecified: Principal | ICD-10-CM

## 2020-02-06 LAB — CBC
HCT: 29.8 % — ABNORMAL LOW (ref 36.0–46.0)
Hemoglobin: 9.5 g/dL — ABNORMAL LOW (ref 12.0–15.0)
MCH: 28.6 pg (ref 26.0–34.0)
MCHC: 31.9 g/dL (ref 30.0–36.0)
MCV: 89.8 fL (ref 80.0–100.0)
Platelets: 311 10*3/uL (ref 150–400)
RBC: 3.32 MIL/uL — ABNORMAL LOW (ref 3.87–5.11)
RDW: 14.1 % (ref 11.5–15.5)
WBC: 22 10*3/uL — ABNORMAL HIGH (ref 4.0–10.5)
nRBC: 0 % (ref 0.0–0.2)

## 2020-02-06 LAB — GLUCOSE, CAPILLARY
Glucose-Capillary: 206 mg/dL — ABNORMAL HIGH (ref 70–99)
Glucose-Capillary: 218 mg/dL — ABNORMAL HIGH (ref 70–99)
Glucose-Capillary: 293 mg/dL — ABNORMAL HIGH (ref 70–99)
Glucose-Capillary: 222 mg/dL — ABNORMAL HIGH (ref 70–99)

## 2020-02-06 LAB — BASIC METABOLIC PANEL
Anion gap: 10 (ref 5–15)
BUN: 44 mg/dL — ABNORMAL HIGH (ref 6–20)
CO2: 19 mmol/L — ABNORMAL LOW (ref 22–32)
Calcium: 8.1 mg/dL — ABNORMAL LOW (ref 8.9–10.3)
Chloride: 104 mmol/L (ref 98–111)
Creatinine, Ser: 4.13 mg/dL — ABNORMAL HIGH (ref 0.44–1.00)
GFR, Estimated: 13 mL/min — ABNORMAL LOW (ref >60)
Glucose, Bld: 165 mg/dL — ABNORMAL HIGH (ref 70–99)
Potassium: 5.1 mmol/L (ref 3.5–5.1)
Sodium: 133 mmol/L — ABNORMAL LOW (ref 135–145)

## 2020-02-06 LAB — HIV ANTIBODY (ROUTINE TESTING W REFLEX): HIV Screen 4th Generation wRfx: NONREACTIVE

## 2020-02-06 LAB — INFLUENZA PANEL BY PCR (TYPE A & B)
Influenza A By PCR: NEGATIVE — AB
Influenza B By PCR: NEGATIVE — AB

## 2020-02-06 MED ORDER — SODIUM CHLORIDE 0.9 % IV SOLN
2.0000 g | INTRAVENOUS | Status: DC
  Administered 2020-02-06 – 2020-02-09 (×4): 2 g via INTRAVENOUS
  Filled 2020-02-06 (×4): qty 2

## 2020-02-06 MED ORDER — INSULIN GLARGINE 100 UNIT/ML ~~LOC~~ SOLN
15.0000 [IU] | Freq: Every day | SUBCUTANEOUS | Status: DC
  Administered 2020-02-06: 10:00:00 15 [IU] via SUBCUTANEOUS
  Filled 2020-02-06 (×2): qty 0.15

## 2020-02-06 MED ORDER — STERILE WATER FOR INJECTION IV SOLN
INTRAVENOUS | Status: DC
  Filled 2020-02-06 (×3): qty 150

## 2020-02-06 NOTE — Progress Notes (Signed)
PROGRESS NOTE    Audrey Mcmillan  [id number] DOB: 03-13-1980 DOA: 02/05/2020 PCP: Kerin Perna, NP   Chief Complain: Cough, body aches  Brief Narrative: Patient is a 40 year old female with past medical history of hyperlipidemia, diabetes type 2, hypothyroidism who presents to the emergency department with complaints of fever, nausea, vomiting.  She was also complaining of intermittent shortness of breath with cough, body ache.  Also reported that she had noticed some blood in the urine which has stopped but denied any dysuria, abdominal pain or urinary frequency.  On presentation she was hemodynamically stable.  Creatinine was found to be 4.6, she had severe leukocytosis.  Chest x-ray showed bilateral lower lung volumes.  UA with history of UTI.  Patient was admitted for the management of severe AKI.  Started on IV fluids.  Assessment & Plan:   Principal Problem:   AKI (acute kidney injury) ([place]) Active Problems:   Acquired hypothyroidism   Type 2 diabetes mellitus with diabetic polyneuropathy, with long-term current use of insulin (HCC)   Acute lower UTI   Flu-like symptoms   AKI: Most likely secondary to severe nausea vomiting, poor oral intake but also could be from UTI.  Renal ultrasound did not show any hydronephrosis or any other obstructive pathology/abscess.  We will check urine sodium and creatinine.  Continue aggressive IV fluid resuscitation.She has non-anion gap metabolic acidosis.  Started on bicarb drip in sterile water.  Urinary tract infection: Could be the precipitating factor.  Follow-up blood cultures.  She was having fever at home.  Denied any dysuria or abdominal pain.  UA grossly suggestive of UTI.  Follow-up urine culture.  Continue current antibiotics.  Nausea/vomiting: Secondary to UTI.  Continue supportive care, IV fluids, antiemetics  Severe leukocytosis: Possibly secondary to UTI.  Follow-up blood cultures.  Continue current antibiotics.   Monitor the CBC trend.  Uncontrolled diabetes mellitus type 2: Not in DKA.  Takes farxiga, glipizide, Lantus at home.  Monitor blood sugars.  Hemoglobin A1c of 10.2.  Diabetic coordinator consultation.  Continue current insulin regimen.  Fever/body aches/flulike symptoms: COVID screening test negative.  Flu negative.  Hypothyroidism: Continue Synthyroid           DVT prophylaxis:heparin [facility] Code Status: Full Family Communication: None at bedside Status is: Inpatient  Remains inpatient appropriate because:Inpatient level of care appropriate due to severity of illness   Dispo: The patient is from: Home              Anticipated d/c is to: Home              Anticipated d/c date is: 1-2 days              Patient currently is not medically stable to d/c.      Consultants: None  Procedures:None  Antimicrobials:  Anti-infectives (From admission, onward)   Start     Dose/Rate Route Frequency Ordered Stop   02/06/20 1200  cefTRIAXone (ROCEPHIN) 2 g in sodium chloride 0.9 % 100 mL IVPB        2 g 200 mL/hr over 30 Minutes Intravenous Every 24 hours 02/06/20 0805     02/05/20 1200  cefTRIAXone (ROCEPHIN) 1 g in sodium chloride 0.9 % 100 mL IVPB  Status:  Discontinued        1 g 200 mL/hr over 30 Minutes Intravenous Every 24 hours 02/05/20 1117 02/06/20 0805      Subjective:  Patient seen and examined at bedside this morning.  Hemodynamically  stable.  Comfortable.  Denies any nausea, vomiting or abdominal pain.  Eager to go home.  Wants to have solid food  Objective: Vitals:   02/05/20 1443 02/05/20 2046 02/06/20 0220 02/06/20 0609  BP: (!) 154/74 (!) 125/57 135/63 (!) 144/70  Pulse: 90 82 94 80  Resp: 17 18 18 18   Temp: 98.9 F (37.2 C) 98.5 F (36.9 C) 100.3 F (37.9 C) 97.6 F (36.4 C)  TempSrc: Oral Oral Oral Oral  SpO2: 97% 97% 95% 95%  Weight:      Height:        Intake/Output Summary (Last 24 hours) at 02/06/2020 6073 Last data filed at 02/06/2020  7106 Gross per 24 hour  Intake 1600 ml  Output 3 ml  Net 1597 ml   Filed Weights   02/04/20 2305  Weight: 73.5 kg    Examination:  General exam: Appears calm and comfortable ,Not in distress,average built HEENT:PERRL,Oral mucosa moist, Ear/Nose normal on gross exam Respiratory system: Bilateral equal air entry, normal vesicular breath sounds, no wheezes or crackles  Cardiovascular system: S1 & S2 heard, RRR. No JVD, murmurs, rubs, gallops or clicks. No pedal edema. Gastrointestinal system: Abdomen is nondistended, soft and nontender. No organomegaly or masses felt. Normal bowel sounds heard. Central nervous system: Alert and oriented. No focal neurological deficits. Extremities: No edema, no clubbing ,no cyanosis Skin: No rashes, lesions or ulcers,no icterus ,no pallor  Data Reviewed: I have personally reviewed following labs and imaging studies  CBC: Recent Labs  Lab 02/05/20 0657 02/06/20 0616  WBC 25.4* 22.0*  NEUTROABS 22.1*  --   HGB 10.8* 9.5*  HCT 34.0* 29.8*  MCV 88.5 89.8  PLT 338 269   Basic Metabolic Panel: Recent Labs  Lab 02/05/20 0657 02/06/20 0616  NA 132* 133*  K 5.2* 5.1  CL 98 104  CO2 23 19*  GLUCOSE 337* 165*  BUN 44* 44*  CREATININE 4.61* 4.13*  CALCIUM 8.9 8.1*   GFR: Estimated Creatinine Clearance: 17.3 mL/min (A) (by C-G formula based on SCr of 4.13 mg/dL (H)). Liver Function Tests: Recent Labs  Lab 02/05/20 0657  AST 17  ALT 17  ALKPHOS 167*  BILITOT 0.4  PROT 7.8  ALBUMIN 2.5*   No results for input(s): LIPASE, AMYLASE in the last 168 hours. No results for input(s): AMMONIA in the last 168 hours. Coagulation Profile: No results for input(s): INR, PROTIME in the last 168 hours. Cardiac Enzymes: No results for input(s): CKTOTAL, CKMB, CKMBINDEX, TROPONINI in the last 168 hours. BNP (last 3 results) No results for input(s): PROBNP in the last 8760 hours. HbA1C: Recent Labs    02/05/20 0707  HGBA1C 10.2*   CBG: Recent  Labs  Lab 02/05/20 1052 02/05/20 1156 02/05/20 1642 02/05/20 2043 02/06/20 0751  GLUCAP 126* 120* 178* 172* 206*   Lipid Profile: No results for input(s): CHOL, HDL, LDLCALC, TRIG, CHOLHDL, LDLDIRECT in the last 72 hours. Thyroid Function Tests: No results for input(s): TSH, T4TOTAL, FREET4, T3FREE, THYROIDAB in the last 72 hours. Anemia Panel: No results for input(s): VITAMINB12, FOLATE, FERRITIN, TIBC, IRON, RETICCTPCT in the last 72 hours. Sepsis Labs: No results for input(s): PROCALCITON, LATICACIDVEN in the last 168 hours.  Recent Results (from the past 240 hour(s))  SARS CORONAVIRUS 2 (TAT 6-24 HRS) Nasopharyngeal Nasopharyngeal Swab     Status: None   Collection Time: 02/05/20  6:57 AM   Specimen: Nasopharyngeal Swab  Result Value Ref Range Status   SARS Coronavirus 2 NEGATIVE NEGATIVE Final  Comment: (NOTE) SARS-CoV-2 target nucleic acids are NOT DETECTED.  The SARS-CoV-2 RNA is generally detectable in upper and lower respiratory specimens during the acute phase of infection. Negative results do not preclude SARS-CoV-2 infection, do not rule out co-infections with other pathogens, and should not be used as the sole basis for treatment or other patient management decisions. Negative results must be combined with clinical observations, patient history, and epidemiological information. The expected result is Negative.  Fact Sheet for Patients: SugarRoll.be  Fact Sheet for Healthcare Providers: https://www.woods-mathews.com/  This test is not yet approved or cleared by the [place] FDA and  has been authorized for detection and/or diagnosis of SARS-CoV-2 by FDA under an Emergency Use Authorization (EUA). This EUA will remain  in effect (meaning this test can be used) for the duration of the COVID-19 declaration under Se ction 564(b)(1) of the Act, 21 U.S.C. section 360bbb-3(b)(1), unless the authorization is terminated  or revoked sooner.  Performed at [facility], [place] [address] [place], [place] [address]          Radiology Studies: DG Chest 2 View  Result Date: 02/04/2020 CLINICAL DATA:  Productive cough, fever EXAM: CHEST - 2 VIEW COMPARISON:  None. FINDINGS: Low lung volumes. Linear densities in the lung bases, likely atelectasis. Heart is normal size. No effusions or acute bony abnormality. IMPRESSION: Low lung volumes, bibasilar atelectasis. Electronically Signed   By: Rolm Baptise M.D.   On: 02/04/2020 23:21   US RENAL  Result Date: 02/05/2020 CLINICAL DATA:  Acute kidney injury in a 40 year old female EXAM: RENAL / URINARY TRACT ULTRASOUND COMPLETE COMPARISON:  CT abdomen and pelvis from 2015 FINDINGS: Right Kidney: Renal measurements: 13.6 x 7.8 x 6.7 cm = volume: 369 mL. Question renal edema with mild effacement of the central sinus echo complex and renal edema. No hydronephrosis. No signs of nephrolithiasis. Question diminished, mildly diminished corticomedullary differentiation. Proximal ureter may be minimally distended. Left Kidney: Renal measurements: 13.3 x 6.6 x 6.6 cm = volume: 302 mL. Question mild renal edema. Mild decreased corticomedullary distinction. No hydronephrosis or nephrolithiasis. Bladder: Appears normal for degree of bladder distention. Other: None. IMPRESSION: Signs of nephromegaly, could be seen in the setting of diabetic nephropathy. Question renal edema without hydronephrosis. This could be seen in the setting of pyelonephritis. No perinephric fluid on submitted images. Question of mild RIGHT proximal ureteral distension without hydronephrosis. Attention on follow-up. Electronically Signed   By: Zetta Bills M.D.   On: 02/05/2020 11:55   US Renal  Result Date: 02/05/2020 CLINICAL DATA:  Acute kidney injury. Creatinine of 4.6 on 02/05/2020. EXAM: RENAL / URINARY TRACT ULTRASOUND COMPLETE COMPARISON:  CT abdomen 09/17/2013 FINDINGS: Right Kidney: Renal measurements:  13.6 by 7.8 by 6.7 = volume: 370 mL. Echogenicity within normal limits. No mass or hydronephrosis visualized. Left Kidney: Renal measurements: 13.3 by 6.6 by 6.6 cm = volume: 302 mL. Echogenicity within normal limits. No mass or hydronephrosis visualized. Bladder: Appears normal for degree of bladder distention. A right ureteral jet is incidentally seen on 1 of the images. Other: None. IMPRESSION: 1. No specific sonographic abnormality is identified to account the patient's new renal insufficiency. No renal abscess is seen. Please note that pyelonephritis can be occult on ultrasound. Electronically Signed   By: Van Clines M.D.   On: 02/05/2020 11:48        Scheduled Meds: . atorvastatin  10 mg Oral Daily  . heparin  5,000 Units Subcutaneous Q8H  . insulin aspart  0-5 Units Subcutaneous QHS  . insulin aspart  0-9 Units Subcutaneous TID WC  . insulin glargine  15 Units Subcutaneous Daily  . levothyroxine  150 mcg Oral QAC breakfast   Continuous Infusions: . cefTRIAXone (ROCEPHIN)  IV       LOS: 1 day    Time spent: 35 mins.More than 50% of that time was spent in counseling and/or coordination of care.      Shelly Coss, MD [facility] P1/15/2022, 8:06 AM

## 2020-02-06 NOTE — Progress Notes (Signed)
Inpatient Diabetes Program Recommendations  AACE/ADA: New Consensus Statement on Inpatient Glycemic Control (2015)  Target Ranges:  Prepandial:   less than 140 mg/dL      Peak postprandial:   less than 180 mg/dL (1-2 hours)      Critically ill patients:  140 - 180 mg/dL   Lab Results  Component Value Date   GLUCAP 293 (H) 02/06/2020   HGBA1C 10.2 (H) 02/05/2020    Review of Glycemic Control  Diabetes history: DM2 Outpatient Diabetes medications: Farxiga 10 mg QAM, glipizide 10 mg bid, Lantus 30 units in am and 60 units QHS, metformin 1000 mg bid Current orders for Inpatient glycemic control: Lantus 15 units QD, Novolog 0-9 units tidwc and 0-5 units QHS  HgbA1C - 10.2% CBGs 206, 218, 293 mg/dL PCP at [facility]. Sees PharmD regarding diabetes meds/treatment. Last visit 12/29/19. Had not been taking her insulin over past 2 days. Has not been monitoring blood sugars.  Inpatient Diabetes Program Recommendations:     Increase Lantus to 20 units QD Add Novolog 3 units tidwc for meal coverage insulin if eating > 50% meal Increase Novolog to 0-15 units tidwc and hs Will order Living Well with Diabetes in Spanish.  Will plan to see on Mon, January 17th.  Thank you. Lorenda Peck, RD, LDN, CDE Inpatient Diabetes Coordinator [phone]

## 2020-02-07 ENCOUNTER — Other Ambulatory Visit: Payer: Self-pay

## 2020-02-07 ENCOUNTER — Encounter (HOSPITAL_COMMUNITY): Payer: Self-pay | Admitting: Internal Medicine

## 2020-02-07 LAB — GLUCOSE, CAPILLARY
Glucose-Capillary: 195 mg/dL — ABNORMAL HIGH (ref 70–99)
Glucose-Capillary: 180 mg/dL — ABNORMAL HIGH (ref 70–99)
Glucose-Capillary: 171 mg/dL — ABNORMAL HIGH (ref 70–99)
Glucose-Capillary: 157 mg/dL — ABNORMAL HIGH (ref 70–99)

## 2020-02-07 LAB — CBC
HCT: 25.8 % — ABNORMAL LOW (ref 36.0–46.0)
Hemoglobin: 8.6 g/dL — ABNORMAL LOW (ref 12.0–15.0)
MCH: 28.8 pg (ref 26.0–34.0)
MCHC: 33.3 g/dL (ref 30.0–36.0)
MCV: 86.3 fL (ref 80.0–100.0)
Platelets: 334 10*3/uL (ref 150–400)
RBC: 2.99 MIL/uL — ABNORMAL LOW (ref 3.87–5.11)
RDW: 13.9 % (ref 11.5–15.5)
WBC: 23 10*3/uL — ABNORMAL HIGH (ref 4.0–10.5)
nRBC: 0 % (ref 0.0–0.2)

## 2020-02-07 LAB — TROPONIN I (HIGH SENSITIVITY)
Troponin I (High Sensitivity): 10 ng/L (ref <18)
Troponin I (High Sensitivity): 9 ng/L (ref <18)

## 2020-02-07 LAB — LACTIC ACID, PLASMA
Lactic Acid, Venous: 1.6 mmol/L (ref 0.5–1.9)
Lactic Acid, Venous: 1 mmol/L (ref 0.5–1.9)

## 2020-02-07 LAB — BASIC METABOLIC PANEL
Anion gap: 11 (ref 5–15)
BUN: 45 mg/dL — ABNORMAL HIGH (ref 6–20)
CO2: 26 mmol/L (ref 22–32)
Calcium: 7.6 mg/dL — ABNORMAL LOW (ref 8.9–10.3)
Chloride: 94 mmol/L — ABNORMAL LOW (ref 98–111)
Creatinine, Ser: 4.68 mg/dL — ABNORMAL HIGH (ref 0.44–1.00)
GFR, Estimated: 12 mL/min — ABNORMAL LOW (ref >60)
Glucose, Bld: 224 mg/dL — ABNORMAL HIGH (ref 70–99)
Potassium: 4.5 mmol/L (ref 3.5–5.1)
Sodium: 131 mmol/L — ABNORMAL LOW (ref 135–145)

## 2020-02-07 LAB — SODIUM, URINE, RANDOM: Sodium, Ur: 57 mmol/L

## 2020-02-07 LAB — CREATININE, URINE, RANDOM: Creatinine, Urine: 37.6 mg/dL

## 2020-02-07 MED ORDER — INSULIN GLARGINE 100 UNIT/ML ~~LOC~~ SOLN
20.0000 [IU] | Freq: Every day | SUBCUTANEOUS | Status: DC
  Administered 2020-02-07 – 2020-02-09 (×3): 20 [IU] via SUBCUTANEOUS
  Filled 2020-02-07 (×3): qty 0.2

## 2020-02-07 MED ORDER — SODIUM CHLORIDE 0.9 % IV BOLUS
1500.0000 mL | Freq: Once | INTRAVENOUS | Status: AC
  Administered 2020-02-07: 11:00:00 1500 mL via INTRAVENOUS

## 2020-02-07 MED ORDER — INSULIN ASPART 100 UNIT/ML ~~LOC~~ SOLN
3.0000 [IU] | Freq: Three times a day (TID) | SUBCUTANEOUS | Status: DC
  Administered 2020-02-07 – 2020-02-09 (×8): 3 [IU] via SUBCUTANEOUS

## 2020-02-07 MED ORDER — SODIUM CHLORIDE 0.9 % IV SOLN: INTRAVENOUS | Status: DC

## 2020-02-07 MED ORDER — INSULIN ASPART 100 UNIT/ML ~~LOC~~ SOLN
0.0000 [IU] | Freq: Three times a day (TID) | SUBCUTANEOUS | Status: DC
  Administered 2020-02-07 – 2020-02-08 (×4): 3 [IU] via SUBCUTANEOUS
  Administered 2020-02-08: 5 [IU] via SUBCUTANEOUS
  Administered 2020-02-08 – 2020-02-09 (×2): 3 [IU] via SUBCUTANEOUS
  Administered 2020-02-09: 09:00:00 2 [IU] via SUBCUTANEOUS

## 2020-02-07 NOTE — Progress Notes (Signed)
Patient was complaining of chest pain. Sudden change this evening.She was very comfortable this mrng.EKG didn't show significant ischemic change.Will check troponin. She is currently hemodynamically stable.

## 2020-02-07 NOTE — Progress Notes (Signed)
PROGRESS NOTE    Audrey Mcmillan  ERX:[id number] DOB: 09-10-1980 DOA: 02/05/2020 PCP: Kerin Perna, NP   Chief Complain: Cough, body aches  Brief Narrative: Patient is a 40 year old female with past medical history of hyperlipidemia, diabetes type 2, hypothyroidism who presents to the emergency department with complaints of fever, nausea, vomiting.  She was also complaining of intermittent shortness of breath with cough, body ache.  Also reported that she had noticed some blood in the urine which has stopped but denied any dysuria, abdominal pain or urinary frequency.  On presentation she was hemodynamically stable.  Creatinine was found to be 4.6, she had severe leukocytosis.  Chest x-ray showed bilateral lower lung volumes.  UA with history of UTI.  Patient was admitted for the management of severe AKI.  Started on IV fluids.  Assessment & Plan:   Principal Problem:   AKI (acute kidney injury) ([place]) Active Problems:   Acquired hypothyroidism   Type 2 diabetes mellitus with diabetic polyneuropathy, with long-term current use of insulin (HCC)   Acute lower UTI   Flu-like symptoms   AKI: Most likely secondary to volume loss from severe nausea vomiting, poor oral intake but also could be from UTI. She also takes lisinopril at home. renal ultrasound did not show any hydronephrosis or any other obstructive pathology/abscess.  Urine sodium is 57.Possible ATN.  Continue aggressive IV fluid resuscitation.She had non-anion gap metabolic acidosis and was started on bicarb drip in sterile water.NAGMA has resolved and started on normal saline. Creatinine is still in the range of 4,bumped up to 4.6.  Nephrology consulted.  Urinary tract infection: Could be the precipitating factor.  Follow-up blood cultures.  She was having fever at home.  Denied any dysuria or abdominal pain.  UA grossly suggestive of UTI.  Follow-up urine culture.  Continue current antibiotics.  Nausea/vomiting:  Secondary to UTI.  Continue supportive care, IV fluids, antiemetics  Severe leukocytosis: Possibly secondary to UTI.  Follow-up blood cultures.  Continue current antibiotics.  Monitor the CBC trend.  Uncontrolled diabetes mellitus type 2: Not in DKA.  Takes farxiga, glipizide, Lantus at home.  Monitor blood sugars.  Hemoglobin A1c of 10.2.  Diabetic coordinator consultation.  Continue current insulin regimen.  Fever/body aches/flulike symptoms: COVID screening test negative.  Flu negative.  Hypothyroidism: Continue Synthyroid           DVT prophylaxis:heparin [place] Code Status: Full Family Communication: None at bedside Status is: Inpatient  Remains inpatient appropriate because:Inpatient level of care appropriate due to severity of illness   Dispo: The patient is from: Home              Anticipated d/c is to: Home              Anticipated d/c date is: 1-2 days              Patient currently is not medically stable to d/c.      Consultants: None  Procedures:None  Antimicrobials:  Anti-infectives (From admission, onward)   Start     Dose/Rate Route Frequency Ordered Stop   02/06/20 1000  cefTRIAXone (ROCEPHIN) 2 g in sodium chloride 0.9 % 100 mL IVPB        2 g 200 mL/hr over 30 Minutes Intravenous Every 24 hours 02/06/20 0805     02/05/20 1200  cefTRIAXone (ROCEPHIN) 1 g in sodium chloride 0.9 % 100 mL IVPB  Status:  Discontinued        1 g 200 mL/hr  over 30 Minutes Intravenous Every 24 hours 02/05/20 1117 02/06/20 0805      Subjective:  Patient seen and examined at bedside this morning.  Hemodynamically stable stable.  Comfortable.  Eager to go home but cannot be discharged because of persistent renal insufficiency.  She says she is peeing good.  Objective: Vitals:   02/06/20 0930 02/06/20 1358 02/06/20 2146 02/07/20 0430  BP: 128/74 (!) 155/81 127/61 135/64  Pulse: 78 81 (!) 101 90  Resp: 18 16 16 18   Temp: (!) 97.5 F (36.4 C) 97.7 F (36.5 C) 99.1 F  (37.3 C) 100.1 F (37.8 C)  TempSrc: Oral Oral Oral Oral  SpO2: 97% 98% 94% 90%  Weight:      Height:        Intake/Output Summary (Last 24 hours) at 02/07/2020 0747 Last data filed at 02/07/2020 0433 Gross per 24 hour  Intake 3584.41 ml  Output 1001 ml  Net 2583.41 ml   Filed Weights   02/04/20 2305  Weight: 73.5 kg    Examination:  General exam: Appears calm and comfortable ,Not in distress,average built HEENT:PERRL,Oral mucosa moist, Ear/Nose normal on gross exam Respiratory system: Bilateral equal air entry, normal vesicular breath sounds, no wheezes or crackles  Cardiovascular system: S1 & S2 heard, RRR. No JVD, murmurs, rubs, gallops or clicks. Gastrointestinal system: Abdomen is nondistended, soft and nontender. No organomegaly or masses felt. Normal bowel sounds heard. Central nervous system: Alert and oriented. No focal neurological deficits. Extremities: No edema, no clubbing ,no cyanosis Skin: No rashes, lesions or ulcers,no icterus ,no pallor  Data Reviewed: I have personally reviewed following labs and imaging studies  CBC: Recent Labs  Lab 02/05/20 0657 02/06/20 0616 02/07/20 0543  WBC 25.4* 22.0* 23.0*  NEUTROABS 22.1*  --   --   HGB 10.8* 9.5* 8.6*  HCT 34.0* 29.8* 25.8*  MCV 88.5 89.8 86.3  PLT 338 311 [id number]   Basic Metabolic Panel: Recent Labs  Lab 02/05/20 0657 02/06/20 0616 02/07/20 0543  NA 132* 133* 131*  K 5.2* 5.1 4.5  CL 98 104 94*  CO2 23 19* 26  GLUCOSE 337* 165* 224*  BUN 44* 44* 45*  CREATININE 4.61* 4.13* 4.68*  CALCIUM 8.9 8.1* 7.6*   GFR: Estimated Creatinine Clearance: 15.3 mL/min (A) (by C-G formula based on SCr of 4.68 mg/dL (H)). Liver Function Tests: Recent Labs  Lab 02/05/20 0657  AST 17  ALT 17  ALKPHOS 167*  BILITOT 0.4  PROT 7.8  ALBUMIN 2.5*   No results for input(s): LIPASE, AMYLASE in the last 168 hours. No results for input(s): AMMONIA in the last 168 hours. Coagulation Profile: No results for  input(s): INR, PROTIME in the last 168 hours. Cardiac Enzymes: No results for input(s): CKTOTAL, CKMB, CKMBINDEX, TROPONINI in the last 168 hours. BNP (last 3 results) No results for input(s): PROBNP in the last 8760 hours. HbA1C: Recent Labs    02/05/20 0707  HGBA1C 10.2*   CBG: Recent Labs  Lab 02/06/20 0751 02/06/20 1141 02/06/20 1711 02/06/20 2144 02/07/20 0733  GLUCAP 206* 218* 293* 222* 195*   Lipid Profile: No results for input(s): CHOL, HDL, LDLCALC, TRIG, CHOLHDL, LDLDIRECT in the last 72 hours. Thyroid Function Tests: No results for input(s): TSH, T4TOTAL, FREET4, T3FREE, THYROIDAB in the last 72 hours. Anemia Panel: No results for input(s): VITAMINB12, FOLATE, FERRITIN, TIBC, IRON, RETICCTPCT in the last 72 hours. Sepsis Labs: No results for input(s): PROCALCITON, LATICACIDVEN in the last 168 hours.  Recent Results (from  the past 240 hour(s))  SARS CORONAVIRUS 2 (TAT 6-24 HRS) Nasopharyngeal Nasopharyngeal Swab     Status: None   Collection Time: 02/05/20  6:57 AM   Specimen: Nasopharyngeal Swab  Result Value Ref Range Status   SARS Coronavirus 2 NEGATIVE NEGATIVE Final    Comment: (NOTE) SARS-CoV-2 target nucleic acids are NOT DETECTED.  The SARS-CoV-2 RNA is generally detectable in upper and lower respiratory specimens during the acute phase of infection. Negative results do not preclude SARS-CoV-2 infection, do not rule out co-infections with other pathogens, and should not be used as the sole basis for treatment or other patient management decisions. Negative results must be combined with clinical observations, patient history, and epidemiological information. The expected result is Negative.  Fact Sheet for Patients: SugarRoll.be  Fact Sheet for Healthcare Providers: https://www.woods-mathews.com/  This test is not yet approved or cleared by the [place] FDA and  has been authorized for detection  and/or diagnosis of SARS-CoV-2 by FDA under an Emergency Use Authorization (EUA). This EUA will remain  in effect (meaning this test can be used) for the duration of the COVID-19 declaration under Se ction 564(b)(1) of the Act, 21 U.S.C. section 360bbb-3(b)(1), unless the authorization is terminated or revoked sooner.  Performed at [facility], [place] [address], [place],  [address]          Radiology Studies: US RENAL  Result Date: 02/05/2020 CLINICAL DATA:  Acute kidney injury in a 40 year old female EXAM: RENAL / URINARY TRACT ULTRASOUND COMPLETE COMPARISON:  CT abdomen and pelvis from 2015 FINDINGS: Right Kidney: Renal measurements: 13.6 x 7.8 x 6.7 cm = volume: 369 mL. Question renal edema with mild effacement of the central sinus echo complex and renal edema. No hydronephrosis. No signs of nephrolithiasis. Question diminished, mildly diminished corticomedullary differentiation. Proximal ureter may be minimally distended. Left Kidney: Renal measurements: 13.3 x 6.6 x 6.6 cm = volume: 302 mL. Question mild renal edema. Mild decreased corticomedullary distinction. No hydronephrosis or nephrolithiasis. Bladder: Appears normal for degree of bladder distention. Other: None. IMPRESSION: Signs of nephromegaly, could be seen in the setting of diabetic nephropathy. Question renal edema without hydronephrosis. This could be seen in the setting of pyelonephritis. No perinephric fluid on submitted images. Question of mild RIGHT proximal ureteral distension without hydronephrosis. Attention on follow-up. Electronically Signed   By: Zetta Bills M.D.   On: 02/05/2020 11:55   US Renal  Result Date: 02/05/2020 CLINICAL DATA:  Acute kidney injury. Creatinine of 4.6 on 02/05/2020. EXAM: RENAL / URINARY TRACT ULTRASOUND COMPLETE COMPARISON:  CT abdomen 09/17/2013 FINDINGS: Right Kidney: Renal measurements: 13.6 by 7.8 by 6.7 = volume: 370 mL. Echogenicity within normal limits. No mass or  hydronephrosis visualized. Left Kidney: Renal measurements: 13.3 by 6.6 by 6.6 cm = volume: 302 mL. Echogenicity within normal limits. No mass or hydronephrosis visualized. Bladder: Appears normal for degree of bladder distention. A right ureteral jet is incidentally seen on 1 of the images. Other: None. IMPRESSION: 1. No specific sonographic abnormality is identified to account the patient's new renal insufficiency. No renal abscess is seen. Please note that pyelonephritis can be occult on ultrasound. Electronically Signed   By: Van Clines M.D.   On: 02/05/2020 11:48        Scheduled Meds: . atorvastatin  10 mg Oral Daily  . heparin  5,000 Units Subcutaneous Q8H  . insulin aspart  0-5 Units Subcutaneous QHS  . insulin aspart  0-9 Units Subcutaneous TID WC  .  insulin glargine  15 Units Subcutaneous Daily  . levothyroxine  150 mcg Oral QAC breakfast   Continuous Infusions: . cefTRIAXone (ROCEPHIN)  IV 2 g (02/06/20 1124)  .  sodium bicarbonate (isotonic) infusion in sterile water 125 mL/hr at 02/07/20 0433     LOS: 2 days    Time spent: 35 mins.More than 50% of that time was spent in counseling and/or coordination of care.      Shelly Coss, MD [facility] P1/16/2022, 7:47 AM

## 2020-02-07 NOTE — Significant Event (Signed)
Rapid Response Event Note   Reason for Call :  Called to bedside for pt complaining of chest pain, and increased need for O2  Initial Focused Assessment:  Neuro: somnolent. Alert and oriented X4 Resp: RR 22/min. Satting 83% on RA.  Cardiac: Chest pain complaints. EKG showed ST 101   Interventions:  Pt placed on 2L [place] . EKG completed.  Tylenol given for tempt of 101.9  Plan of Care:  Verbal from MD to order troponin. MD notes currently hemodynamically stable and wanting to continue to monitor on unit.   Event Summary:   MD Notified: Dr. Tawanna Solo Call Time: 2774 Arrival Time: 1287 End Time: [place], RN

## 2020-02-07 NOTE — Consult Note (Signed)
Renal Service Consult Note [facility]  Audrey Mcmillan 02/07/2020 Sol Blazing, MD Requesting Physician: Dr. Tawanna Solo  Reason for Consult: Renal failure HPI: The patient is a 40 y.o. year-old w/ hx of HL and DM2, hypothyroid and possibly HTN who presented w/ prod cough, fevers and body aches x 2 weeks. N/V x 2 wks as well. Had some blood in urine 3 wks ago which has resolved, no dysuria or abd pain. In ED pt afeb, BP's wnl, Na 132 K 5.2  BUN 44 , creat 4.6, AG 11  Alb 2.5  WBC 25k and Hb 10. CXR shows bibasilar atx. Pt rec'd 1L NS bolus and IV insulin. Renal US 13-14 cm kidneys w/ some possible edema, no hydro. UA neg protein, 6-10 wbc/ rbc, many bacteria. UNa 57 , UCr 38. UOP 600 yest and 400 today. Creat up to 4.6 today. Asked to see for renal failure.   Pt seen in room, appetite is improving her, asking for clear liquids.  No SOB or cough. No abd pain. No hx kidney disease/ failure. Pt has DM2 x 7-8 years, is on insulin. No neuropathy or retinopathy. A1C is 10.     ROS  denies CP  no joint pain   no HA  no blurry vision  no rash  no diarrhea  no nausea/ vomiting  no dysuria  no difficulty voiding  no change in urine color    Past Medical History  Past Medical History:  Diagnosis Date  . Diabetes mellitus without complication ([place])   . High cholesterol    Past Surgical History History reviewed. No pertinent surgical history. Family History History reviewed. No pertinent family history. Social History  reports that she has never smoked. She has never used smokeless tobacco. She reports that she does not drink alcohol and does not use drugs. Allergies No Known Allergies Home medications Prior to Admission medications   Medication Sig Start Date End Date Taking? Authorizing Provider  atorvastatin (LIPITOR) 40 MG tablet Take 40 mg by mouth 2 (two) times daily.   Yes [provider]  dapagliflozin propanediol (FARXIGA) 10 MG TABS tablet Take  1 tablet (10 mg total) by mouth daily before breakfast. 09/14/19  Yes Newlin, Enobong, MD  glipiZIDE (GLUCOTROL) 10 MG tablet Take 1 tablet (10 mg total) by mouth 2 (two) times daily before a meal. 09/14/19  Yes Newlin, Enobong, MD  insulin glargine (LANTUS) 100 UNIT/ML injection Inject 60 units at bedtime and 30 units in the morning. Patient taking differently: Inject 30-60 Units into the skin 2 (two) times daily. Inject 60 units at bedtime and 30 units in the morning. 09/14/19  Yes Charlott Rakes, MD  levothyroxine (SYNTHROID) 150 MCG tablet Take 1 tablet (150 mcg total) by mouth daily before breakfast. 09/15/19  Yes Newlin, Enobong, MD  lisinopril (ZESTRIL) 2.5 MG tablet Take 1 tablet (2.5 mg total) by mouth daily. 12/15/19  Yes Kerin Perna, NP  metFORMIN (GLUCOPHAGE) 1000 MG tablet Take 1 tablet (1,000 mg total) by mouth 2 (two) times daily with a meal. 09/14/19  Yes Newlin, Enobong, MD  atorvastatin (LIPITOR) 10 MG tablet Take 1 tablet (10 mg total) by mouth daily. Patient not taking: No sig reported 12/22/19   Kerin Perna, NP  Blood Glucose Monitoring Suppl (TRUE METRIX METER) w/Device KIT Use as instructed 02/19/18   Gildardo Pounds, NP  glucose blood (TRUE METRIX BLOOD GLUCOSE TEST) test strip Use as instructed. Check blood glucose levels twice per day.  02/19/18   Gildardo Pounds, NP  Insulin Syringe-Needle U-100 (INSULIN SYRINGE 1CC/30GX5/16") 30G X 5/16" 1 ML MISC Inject 1 application into the skin 2 times daily at 12 noon and 4 pm. 03/06/19   Kerin Perna, NP  TRUEplus Lancets 28G MISC Use as instructed. Check blood glucose levels twice per day. 11/28/18   Kerin Perna, NP     Vitals:   02/06/20 0930 02/06/20 1358 02/06/20 2146 02/07/20 0430  BP: 128/74 (!) 155/81 127/61 135/64  Pulse: 78 81 (!) 101 90  Resp: 18 16 16 18   Temp: (!) 97.5 F (36.4 C) 97.7 F (36.5 C) 99.1 F (37.3 C) 100.1 F (37.8 C)  TempSrc: Oral Oral Oral Oral  SpO2: 97% 98% 94% 90%   Weight:      Height:       Exam Gen alert, no distress, WD and WN No rash, cyanosis or gangrene Sclera anicteric, throat clear  No jvd or bruits, JVP about 6 cm Chest clear bilat to bases, no rales/ wheezing RRR no MRG, tachy Abd soft ntnd no mass or ascites +bs GU defer  MS no joint effusions or deformity  Ext no leg or UE edema Neuro is alert, Ox 3 , nf, no asterixis    Home meds:  - lisinopril 2.5 qd/ lipitor 10  - glipizide 10 bid/ metformin 1 gm bid/ insulin lantus 60u hs and 30u am  - synthroid 150 qd    CXR 1/15 -  IMPRESSION: Low lung volumes, bibasilar atelectasis.    Na 132 K 5.2  BUN 44   creat 4.6  AG 11  Alb 2.5  WBC 25k  Hb 10.     Renal US 13-14 cm kidneys w/ some possible edema, no hydro     UA - neg protein, 6-10 wbc/ rbc, many bacteria      UNa 57 , UCr 38      UOP 600 yest and 400 today.     BP 135/ 64, HR 90- 100   Assessment/ Plan: 1. AKI - b/l creat in Aug 2021 was 0.76.  No hx of AKI or CKD. UA small rbc/ wb/ bact, renal US enlarged kidney poss early diabetic neph. No obstruction. No protein on UA. No signs of GN. Pt was vomiting x 2 wks and is still vol depleted. Will bolus 1.5 L , cont NS at 125 /hr. She may have ATN element from severe dehydration/ ACEi effects but just as likely is all hemodynamic.  She is making urine. Suspect will recover shortly.  Will follow.  2. UTI - cx's pending, IV Rocephin 3. Fever/ nausea/ vomiting - ? Gastroenteritis and/or UTI. Getting IV abx / Rocephin.  4. DM2 w/ ^BS - per primary 5. Hypothyroidism      Kelly Splinter  MD 02/07/2020, 8:36 AM  Recent Labs  Lab 02/06/20 0616 02/07/20 0543  WBC 22.0* 23.0*  HGB 9.5* 8.6*   Recent Labs  Lab 02/06/20 0616 02/07/20 0543  K 5.1 4.5  BUN 44* 45*  CREATININE 4.13* 4.68*  CALCIUM 8.1* 7.6*

## 2020-02-07 NOTE — Progress Notes (Incomplete)
Called to pt's rm at 1750 bc pt was complaining of chest pain, had a temp of 101.9 and O2 sats were 83% on RA. Applied O2 @ 2 L per [place]. Pt hot/dry to touch, lethargic, states pain is in center of chest, no radiation, is a pressure type pain. Denied nausea

## 2020-02-08 ENCOUNTER — Inpatient Hospital Stay (HOSPITAL_COMMUNITY): Payer: Self-pay

## 2020-02-08 LAB — CBC
HCT: 26.9 % — ABNORMAL LOW (ref 36.0–46.0)
Hemoglobin: 8.4 g/dL — ABNORMAL LOW (ref 12.0–15.0)
MCH: 28.2 pg (ref 26.0–34.0)
MCHC: 31.2 g/dL (ref 30.0–36.0)
MCV: 90.3 fL (ref 80.0–100.0)
Platelets: 349 10*3/uL (ref 150–400)
RBC: 2.98 MIL/uL — ABNORMAL LOW (ref 3.87–5.11)
RDW: 14.2 % (ref 11.5–15.5)
WBC: 24.8 10*3/uL — ABNORMAL HIGH (ref 4.0–10.5)
nRBC: 0 % (ref 0.0–0.2)

## 2020-02-08 LAB — BASIC METABOLIC PANEL
Anion gap: 18 — ABNORMAL HIGH (ref 5–15)
BUN: 37 mg/dL — ABNORMAL HIGH (ref 6–20)
CO2: 20 mmol/L — ABNORMAL LOW (ref 22–32)
Calcium: 7.8 mg/dL — ABNORMAL LOW (ref 8.9–10.3)
Chloride: 97 mmol/L — ABNORMAL LOW (ref 98–111)
Creatinine, Ser: 4.12 mg/dL — ABNORMAL HIGH (ref 0.44–1.00)
GFR, Estimated: 14 mL/min — ABNORMAL LOW (ref >60)
Glucose, Bld: 109 mg/dL — ABNORMAL HIGH (ref 70–99)
Potassium: 5.1 mmol/L (ref 3.5–5.1)
Sodium: 135 mmol/L (ref 135–145)

## 2020-02-08 LAB — GLUCOSE, CAPILLARY
Glucose-Capillary: 171 mg/dL — ABNORMAL HIGH (ref 70–99)
Glucose-Capillary: 218 mg/dL — ABNORMAL HIGH (ref 70–99)
Glucose-Capillary: 154 mg/dL — ABNORMAL HIGH (ref 70–99)
Glucose-Capillary: 142 mg/dL — ABNORMAL HIGH (ref 70–99)

## 2020-02-08 MED ORDER — AZITHROMYCIN 250 MG PO TABS
500.0000 mg | ORAL_TABLET | Freq: Every day | ORAL | Status: DC
  Administered 2020-02-08 – 2020-02-09 (×2): 500 mg via ORAL
  Filled 2020-02-08 (×2): qty 2

## 2020-02-08 NOTE — Progress Notes (Signed)
Inpatient Diabetes Program Recommendations  AACE/ADA: New Consensus Statement on Inpatient Glycemic Control (2015)  Target Ranges:  Prepandial:   less than 140 mg/dL      Peak postprandial:   less than 180 mg/dL (1-2 hours)      Critically ill patients:  140 - 180 mg/dL   Lab Results  Component Value Date   GLUCAP 218 (H) 02/08/2020   HGBA1C 10.2 (H) 02/05/2020   Spoke with pt at bedside regarding her diabetes and HgbA1C of 10.2%. Pt states that she doesn't take the 30 units of Lantus in the morning if her blood sugars are normal. States she takes Lantus 60 units QHS, Does not have rapid-acting insulin ordered for home. States she eats a lot of carbs from tortillas, rice, pasta, and potatoes. Discussed cutting back on portion sizes and trying to eat carbs high in fiber, to help control blood sugars. Pt willing to do this. Also instructed pt to check blood sugars at different times throughout the day, instead of only morning and night.  Pt states she never has low blood sugars, but reviewed hypoglycemia s/s and treatment. PCP is [place] and she sees Pharm D for her diabetes meds. Discussed goal of 7% to reduce risk of long and short-term complications. Pt seems motivated to make lifestyle changes, to start exercising and wants to try to lose a little bit of weight. Wants to be healthier with her diabetes.   I think pt needs rapid-acting insulin for home. 1/16 - Novolog 18 units total 1/17 - Novolog 14 units thus far    May benefit from adding Novolog 6 units tidwc to discharge meds.  Continue to follow.   Thank you. Lorenda Peck, RD, LDN, CDE Inpatient Diabetes Coordinator [phone]

## 2020-02-08 NOTE — Progress Notes (Signed)
[facility] Progress Note  Subjective: pt seen in room. Had fevers overnight, SOB, rapid response called. On nasal O2 this am.  Pt is w/o complaints, "when can I go home". Denies SOB or cough.  Creat 4.12, down a bit. UOP good yest 1300.    Vitals:   02/07/20 2108 02/07/20 2214 02/08/20 0200 02/08/20 0640  BP: 111/64 121/65 (!) 144/69 (!) 154/67  Pulse: 77 81 88 92  Resp: 18 18 18 18   Temp: 98.6 F (37 C) 98.5 F (36.9 C) 99.5 F (37.5 C) (!) 101.2 F (38.4 C)  TempSrc: Oral Oral Oral Oral  SpO2: 98% 98% 97% 90%  Weight:      Height:        Exam: Gen alert, nasal O2 Chest bilat crackles 1/3 up RRR no MRG, tachy Abd soft ntnd no mass or ascites +bs Ext no LE or UE edema Neuro is alert, Ox 3 , nf, no asterixis    Home meds:  - lisinopril 2.5 qd/ lipitor 10  - glipizide 10 bid/ metformin 1 gm bid/ insulin lantus 60u hs and 30u am  - synthroid 150 qd    CXR 1/15 -  IMPRESSION: Low lung volumes, bibasilar atelectasis.    Na 132 K 5.2  BUN 44   creat 4.6  AG 11  Alb 2.5  WBC 25k  Hb 10.     Renal US 13-14 cm kidneys w/ some possible edema, no hydro     UA - neg protein, 6-10 wbc/ rbc, many bacteria      UNa 57 , UCr 38      UOP 600 yest and 400 today.     BP 135/ 64, HR 90- 100   Assessment/ Plan: 1. AKI - b/l creat in Aug 2021 was 0.76.  No hx of AKI or CKD. UA here w/ small rbc/ wbc/ bact. Renal US showed enlarged kidneys poss early diabetic neph. No obstruction. No protein on UA. No signs of GN. Gave extra IVF"s but last night pt SOB and rales on exam today. Will dc IVF"s, check repeat CXR. Creat down a bit to 4.1.  No uremic signs, no need RRT. Will follow.  2. UTI - cx's still pending, IV Rocephin 3. Fevers? ^^WBC - PNA vs UTI vs other. Getting IV abx / Rocephin.  +bilat crackles today > repeat CXR ordered.  4. DM2 w/ ^BS - per primary 5. Hypothyroidism        Rob Julieana Eshleman 02/08/2020, 8:58 AM   Recent Labs  Lab 02/07/20 0543  02/08/20 0531  K 4.5 5.1  BUN 45* 37*  CREATININE 4.68* 4.12*  CALCIUM 7.6* 7.8*  HGB 8.6* 8.4*   Inpatient medications: . atorvastatin  10 mg Oral Daily  . heparin  5,000 Units Subcutaneous Q8H  . insulin aspart  0-15 Units Subcutaneous TID WC  . insulin aspart  0-5 Units Subcutaneous QHS  . insulin aspart  3 Units Subcutaneous TID WC  . insulin glargine  20 Units Subcutaneous Daily  . levothyroxine  150 mcg Oral QAC breakfast   . cefTRIAXone (ROCEPHIN)  IV 2 g (02/07/20 1032)   acetaminophen **OR** acetaminophen, metoprolol tartrate, ondansetron **OR** ondansetron (ZOFRAN) IV, polyethylene glycol

## 2020-02-08 NOTE — Progress Notes (Signed)
PROGRESS NOTE    Audrey Mcmillan  IOX:[id number] DOB: Apr 01, 1980 DOA: 02/05/2020 PCP: Kerin Perna, NP   Chief Complain: Cough, body aches  Brief Narrative: Patient is a 40 year old female with past medical history of hyperlipidemia, diabetes type 2, hypothyroidism who presents to the emergency department with complaints of fever, nausea, vomiting.  She was also complaining of intermittent shortness of breath with cough, body ache.  Also reported that she had noticed some blood in the urine which has stopped but denied any dysuria, abdominal pain or urinary frequency.  On presentation she was hemodynamically stable.  Creatinine was found to be 4.6, she had severe leukocytosis.  Chest x-ray showed bilateral lower lung volumes.  UA suggestive  of UTI.  Patient was admitted for the management of severe AKI.  Started on IV fluids.  Hospital course remarkable for intermittent fever, finding of left lower lobe pneumonia, leukocytosis.  Assessment & Plan:   Principal Problem:   AKI (acute kidney injury) ([place]) Active Problems:   Acquired hypothyroidism   Type 2 diabetes mellitus with diabetic polyneuropathy, with long-term current use of insulin (HCC)   Acute lower UTI   Flu-like symptoms   AKI: Most likely secondary to volume loss from severe nausea vomiting, poor oral intake but also could be from UTI. She also takes lisinopril at home. renal ultrasound did not show any hydronephrosis or any other obstructive pathology/abscess.  Urine sodium is 57.Possible ATN.  Continue aggressive IV fluid resuscitation.She had non-anion gap metabolic acidosis and was started on bicarb drip in sterile water.NAGMA has resolved and started on normal saline.  Nephrology consulted. Creatinine is trending down now.  IV fluids has been discontinued due to concern for volume overload.  Sepsis/fever/urinary tract infection: Could be the precipitating factor.  Follow-up blood cultures, no growth till date.   She was having fever at home, has intermittent fever here.  Denied any dysuria or abdominal pain.  UA grossly suggestive of UTI.  Follow-up urine culture. Chest x-ray done today showed possible left lower lobe pneumonia.  Added azithromycin.  She still has significant leukocytosis.  Nausea/vomiting: Secondary to UTI.Resolved  Severe leukocytosis/pneumonia: Possibly secondary to UTI/PNA.  Follow-up blood cultures.  Continue current antibiotics.  Monitor the CBC trend.  Denies any shortness of breath or cough.  Has mild basilar crackles on the left side  Uncontrolled diabetes mellitus type 2: Not in DKA.  Takes farxiga, glipizide, Lantus at home.  Monitor blood sugars.  Hemoglobin A1c of 10.2.  Diabetic coordinator consultation.  Continue current insulin regimen.  Fever/body aches/flulike symptoms: COVID screening test negative.  Flu negative.  Hypothyroidism: Continue Synthyroid           DVT prophylaxis:heparin [place] Code Status: Full Family Communication: None at bedside Status is: Inpatient  Remains inpatient appropriate because:Inpatient level of care appropriate due to severity of illness   Dispo: The patient is from: Home              Anticipated d/c is to: Home              Anticipated d/c date is: 1-2 days              Patient currently is not medically stable to d/c.      Consultants: None  Procedures:None  Antimicrobials:  Anti-infectives (From admission, onward)   Start     Dose/Rate Route Frequency Ordered Stop   02/08/20 1400  azithromycin (ZITHROMAX) tablet 500 mg        500  mg Oral Daily 02/08/20 1234 02/11/20 0959   02/06/20 1000  cefTRIAXone (ROCEPHIN) 2 g in sodium chloride 0.9 % 100 mL IVPB        2 g 200 mL/hr over 30 Minutes Intravenous Every 24 hours 02/06/20 0805     02/05/20 1200  cefTRIAXone (ROCEPHIN) 1 g in sodium chloride 0.9 % 100 mL IVPB  Status:  Discontinued        1 g 200 mL/hr over 30 Minutes Intravenous Every 24 hours 02/05/20 1117  02/06/20 0805      Subjective:  Patient seen and examined at the bedside this morning.  Hemodynamically stable and very comfortable during my evaluation and denies any complaints.  No cough or shortness of breath.  She was recorded to have fever earlier this morning but was afebrile during my evaluation.  Objective: Vitals:   02/07/20 2214 02/08/20 0200 02/08/20 0640 02/08/20 0959  BP: 121/65 (!) 144/69 (!) 154/67 117/64  Pulse: 81 88 92 84  Resp: 18 18 18 18   Temp: 98.5 F (36.9 C) 99.5 F (37.5 C) (!) 101.2 F (38.4 C) 98.1 F (36.7 C)  TempSrc: Oral Oral Oral Oral  SpO2: 98% 97% 90% 93%  Weight:      Height:        Intake/Output Summary (Last 24 hours) at 02/08/2020 1235 Last data filed at 02/08/2020 0800 Gross per 24 hour  Intake 2334.78 ml  Output 1100 ml  Net 1234.78 ml   Filed Weights   02/04/20 2305  Weight: 73.5 kg    Examination:  General exam: Appears calm and comfortable ,Not in distress,average built HEENT:PERRL,Oral mucosa moist, Ear/Nose normal on gross exam Respiratory system: Crackles on the left base  cardiovascular system: S1 & S2 heard, RRR. No JVD, murmurs, rubs, gallops or clicks. Gastrointestinal system: Abdomen is nondistended, soft and nontender. No organomegaly or masses felt. Normal bowel sounds heard. Central nervous system: Alert and oriented. No focal neurological deficits. Extremities: No edema, no clubbing ,no cyanosis,  Skin: No rashes, lesions or ulcers,no icterus ,no pallor   Data Reviewed: I have personally reviewed following labs and imaging studies  CBC: Recent Labs  Lab 02/05/20 0657 02/06/20 0616 02/07/20 0543 02/08/20 0531  WBC 25.4* 22.0* 23.0* 24.8*  NEUTROABS 22.1*  --   --   --   HGB 10.8* 9.5* 8.6* 8.4*  HCT 34.0* 29.8* 25.8* 26.9*  MCV 88.5 89.8 86.3 90.3  PLT 338 311 334 0000000   Basic Metabolic Panel: Recent Labs  Lab 02/05/20 0657 02/06/20 0616 02/07/20 0543 02/08/20 0531  NA 132* 133* 131* 135  K  5.2* 5.1 4.5 5.1  CL 98 104 94* 97*  CO2 23 19* 26 20*  GLUCOSE 337* 165* 224* 109*  BUN 44* 44* 45* 37*  CREATININE 4.61* 4.13* 4.68* 4.12*  CALCIUM 8.9 8.1* 7.6* 7.8*   GFR: Estimated Creatinine Clearance: 17.4 mL/min (A) (by C-G formula based on SCr of 4.12 mg/dL (H)). Liver Function Tests: Recent Labs  Lab 02/05/20 0657  AST 17  ALT 17  ALKPHOS 167*  BILITOT 0.4  PROT 7.8  ALBUMIN 2.5*   No results for input(s): LIPASE, AMYLASE in the last 168 hours. No results for input(s): AMMONIA in the last 168 hours. Coagulation Profile: No results for input(s): INR, PROTIME in the last 168 hours. Cardiac Enzymes: No results for input(s): CKTOTAL, CKMB, CKMBINDEX, TROPONINI in the last 168 hours. BNP (last 3 results) No results for input(s): PROBNP in the last 8760 hours. HbA1C: No  results for input(s): HGBA1C in the last 72 hours. CBG: Recent Labs  Lab 02/07/20 1207 02/07/20 1652 02/07/20 2216 02/08/20 0905 02/08/20 1211  GLUCAP 180* 171* 157* 171* 218*   Lipid Profile: No results for input(s): CHOL, HDL, LDLCALC, TRIG, CHOLHDL, LDLDIRECT in the last 72 hours. Thyroid Function Tests: No results for input(s): TSH, T4TOTAL, FREET4, T3FREE, THYROIDAB in the last 72 hours. Anemia Panel: No results for input(s): VITAMINB12, FOLATE, FERRITIN, TIBC, IRON, RETICCTPCT in the last 72 hours. Sepsis Labs: Recent Labs  Lab 02/07/20 1956 02/07/20 2232  LATICACIDVEN 1.6 1.0    Recent Results (from the past 240 hour(s))  SARS CORONAVIRUS 2 (TAT 6-24 HRS) Nasopharyngeal Nasopharyngeal Swab     Status: None   Collection Time: 02/05/20  6:57 AM   Specimen: Nasopharyngeal Swab  Result Value Ref Range Status   SARS Coronavirus 2 NEGATIVE NEGATIVE Final    Comment: (NOTE) SARS-CoV-2 target nucleic acids are NOT DETECTED.  The SARS-CoV-2 RNA is generally detectable in upper and lower respiratory specimens during the acute phase of infection. Negative results do not preclude  SARS-CoV-2 infection, do not rule out co-infections with other pathogens, and should not be used as the sole basis for treatment or other patient management decisions. Negative results must be combined with clinical observations, patient history, and epidemiological information. The expected result is Negative.  Fact Sheet for Patients: SugarRoll.be  Fact Sheet for Healthcare Providers: https://www.woods-mathews.com/  This test is not yet approved or cleared by the [place] FDA and  has been authorized for detection and/or diagnosis of SARS-CoV-2 by FDA under an Emergency Use Authorization (EUA). This EUA will remain  in effect (meaning this test can be used) for the duration of the COVID-19 declaration under Se ction 564(b)(1) of the Act, 21 U.S.C. section 360bbb-3(b)(1), unless the authorization is terminated or revoked sooner.  Performed at [facility], [place] [address] [place], [place] [address]   Culture, blood (routine x 2)     Status: None (Preliminary result)   Collection Time: 02/07/20  8:17 AM   Specimen: BLOOD  Result Value Ref Range Status   Specimen Description   Final    BLOOD BLOOD LEFT HAND Performed at [facility] [address] [place], [place] [address]    Special Requests   Final    BOTTLES DRAWN AEROBIC ONLY Blood Culture adequate volume Performed at [facility] [address] [place], [place] [address]    Culture   Final    NO GROWTH < 24 HOURS Performed at [facility] [address], [place], [place] [address]    Report Status PENDING  Incomplete  Culture, blood (routine x 2)     Status: None (Preliminary result)   Collection Time: 02/07/20  8:26 AM   Specimen: BLOOD  Result Value Ref Range Status   Specimen Description   Final    BLOOD BLOOD RIGHT HAND Performed at [facility] [address] [place], [place] [address]     Special Requests   Final    BOTTLES DRAWN AEROBIC ONLY Blood Culture adequate volume Performed at [facility] [address], [place], [place] [address]    Culture   Final    NO GROWTH < 24 HOURS Performed at [facility] [address], [place],  [address]    Report Status PENDING  Incomplete         Radiology Studies: DG CHEST PORT  1 VIEW  Result Date: 02/08/2020 CLINICAL DATA:  40 year old female with productive cough, fever, body ache, shortness of breath. EXAM: PORTABLE CHEST 1 VIEW COMPARISON:  Chest radiographs 02/04/2020 and earlier. FINDINGS: Portable AP semi upright view at 0908 hours. Lower lung volumes and lordotic view. Mediastinal contours remain within normal limits. Visualized tracheal air column is within normal limits. Patchy new bilateral lung base opacity greater on the left. Mildly increased interstitium elsewhere. No pneumothorax. No definite pleural effusion. Paucity of bowel gas in the upper abdomen. No acute osseous abnormality identified. IMPRESSION: Lower lung volumes with new patchy lung base opacity greater on the left. Favor pneumonia in this setting. Electronically Signed   By: Genevie Ann M.D.   On: 02/08/2020 09:35        Scheduled Meds: . atorvastatin  10 mg Oral Daily  . azithromycin  500 mg Oral Daily  . heparin  5,000 Units Subcutaneous Q8H  . insulin aspart  0-15 Units Subcutaneous TID WC  . insulin aspart  0-5 Units Subcutaneous QHS  . insulin aspart  3 Units Subcutaneous TID WC  . insulin glargine  20 Units Subcutaneous Daily  . levothyroxine  150 mcg Oral QAC breakfast   Continuous Infusions: . cefTRIAXone (ROCEPHIN)  IV 2 g (02/08/20 1030)     LOS: 3 days    Time spent: 35 mins.More than 50% of that time was spent in counseling and/or coordination of care.      Shelly Coss, MD [facility] P1/17/2022, 12:35 PM

## 2020-02-09 ENCOUNTER — Other Ambulatory Visit (HOSPITAL_COMMUNITY): Payer: Self-pay | Admitting: Internal Medicine

## 2020-02-09 ENCOUNTER — Telehealth: Payer: Self-pay | Admitting: Primary Care

## 2020-02-09 LAB — CBC
HCT: 23 % — ABNORMAL LOW (ref 36.0–46.0)
Hemoglobin: 7.4 g/dL — ABNORMAL LOW (ref 12.0–15.0)
MCH: 28.1 pg (ref 26.0–34.0)
MCHC: 32.2 g/dL (ref 30.0–36.0)
MCV: 87.5 fL (ref 80.0–100.0)
Platelets: 367 10*3/uL (ref 150–400)
RBC: 2.63 MIL/uL — ABNORMAL LOW (ref 3.87–5.11)
RDW: 13.8 % (ref 11.5–15.5)
WBC: 19.3 10*3/uL — ABNORMAL HIGH (ref 4.0–10.5)
nRBC: 0 % (ref 0.0–0.2)

## 2020-02-09 LAB — IRON AND TIBC
Iron: 21 ug/dL — ABNORMAL LOW (ref 28–170)
Saturation Ratios: 11 % (ref 10.4–31.8)
TIBC: 195 ug/dL — ABNORMAL LOW (ref 250–450)
UIBC: 174 ug/dL

## 2020-02-09 LAB — URINE CULTURE: Culture: NO GROWTH

## 2020-02-09 LAB — BASIC METABOLIC PANEL
Anion gap: 7 (ref 5–15)
BUN: 34 mg/dL — ABNORMAL HIGH (ref 6–20)
CO2: 27 mmol/L (ref 22–32)
Calcium: 7.9 mg/dL — ABNORMAL LOW (ref 8.9–10.3)
Chloride: 98 mmol/L (ref 98–111)
Creatinine, Ser: 3.52 mg/dL — ABNORMAL HIGH (ref 0.44–1.00)
GFR, Estimated: 16 mL/min — ABNORMAL LOW (ref >60)
Glucose, Bld: 122 mg/dL — ABNORMAL HIGH (ref 70–99)
Potassium: 4.5 mmol/L (ref 3.5–5.1)
Sodium: 132 mmol/L — ABNORMAL LOW (ref 135–145)

## 2020-02-09 LAB — FERRITIN: Ferritin: 198 ng/mL (ref 11–307)

## 2020-02-09 LAB — GLUCOSE, CAPILLARY
Glucose-Capillary: 122 mg/dL — ABNORMAL HIGH (ref 70–99)
Glucose-Capillary: 171 mg/dL — ABNORMAL HIGH (ref 70–99)

## 2020-02-09 MED ORDER — INSULIN ASPART 100 UNIT/ML ~~LOC~~ SOLN: 6.0000 [IU] | Freq: Three times a day (TID) | SUBCUTANEOUS | 0 refills | Status: DC

## 2020-02-09 MED ORDER — AZITHROMYCIN 500 MG PO TABS: 500.0000 mg | ORAL_TABLET | Freq: Once | ORAL | 0 refills | Status: DC

## 2020-02-09 MED ORDER — SODIUM CHLORIDE 0.9 % IV SOLN
510.0000 mg | Freq: Once | INTRAVENOUS | Status: AC
  Administered 2020-02-09: 13:00:00 510 mg via INTRAVENOUS
  Filled 2020-02-09: qty 170

## 2020-02-09 MED ORDER — FERROUS SULFATE 325 (65 FE) MG PO TABS
325.0000 mg | ORAL_TABLET | Freq: Every day | ORAL | Status: DC
Start: 2020-02-10 — End: 2020-02-09

## 2020-02-09 MED ORDER — FERROUS SULFATE 325 (65 FE) MG PO TABS: 325.0000 mg | ORAL_TABLET | Freq: Every day | ORAL | 1 refills | Status: DC

## 2020-02-09 MED ORDER — CEFDINIR 300 MG PO CAPS: 300.0000 mg | ORAL_CAPSULE | Freq: Two times a day (BID) | ORAL | 0 refills | Status: DC

## 2020-02-09 MED ORDER — METFORMIN HCL 1000 MG PO TABS: 1000.0000 mg | ORAL_TABLET | Freq: Two times a day (BID) | ORAL | 1 refills | Status: DC

## 2020-02-09 MED FILL — NovoLOG 100 UNIT/ML SOLN: 100 | 28 days supply | Qty: 10 | Fill #0

## 2020-02-09 MED FILL — FERROUS SULFATE 325 MG TAB: 325 (65 FE) | 30 days supply | Qty: 30 | Fill #0

## 2020-02-09 MED FILL — CEFDINIR 300 MG CAPSULE: 300 | 2 days supply | Qty: 4 | Fill #0

## 2020-02-09 MED FILL — AZITHROMYCIN 500 MG TABS: 500 | 1 days supply | Qty: 1 | Fill #0

## 2020-02-09 NOTE — Progress Notes (Signed)
Patient was given discharge instructions, and all questions were answered. Patient was stable for discharge and was walked to the main exit. 

## 2020-02-09 NOTE — TOC Initial Note (Signed)
Transition of Care [facility]) - Initial/Assessment Note    Patient Details  Name: Audrey Mcmillan MRN: [id number] Date of Birth: 31-Dec-1980  Transition of [facility] CM/SW Contact:    Lia Hopping, [place] Phone Number: 02/09/2020, 2:57 PM  Clinical Narrative:                 CSW met with the patient and her niece at bedside to discuss PCP and medication needs.  Patient reports she is active with [facility] [place] FNP.  The patient she does not need assistance affording her medications.  Patient niece here to transport her home.   Expected Discharge Plan: Home/Self Care Barriers to Discharge: Barriers Resolved   Patient Goals and CMS Choice   CMS Medicare.gov Compare Post Acute Care list provided to:: Patient Choice offered to / list presented to : Patient  Expected Discharge Plan and Services Expected Discharge Plan: Home/Self Care In-house Referral: Clinical Social Work     Living arrangements for the past 2 months: Single Family Home Expected Discharge Date: 02/09/20                                    Prior Living Arrangements/Services Living arrangements for the past 2 months: Single Family Home Lives with:: Self Patient language and need for interpreter reviewed:: No Do you feel safe going back to the place where you live?: Yes      Need for Family Participation in Patient Care: Yes (Comment) Care giver support system in place?: Yes (comment)   Criminal Activity/Legal Involvement Pertinent to Current Situation/Hospitalization: No - Comment as needed  Activities of Daily Living Home Assistive Devices/Equipment: CBG Meter ADL Screening (condition at time of admission) Patient's cognitive ability adequate to safely complete daily activities?: Yes Is the patient deaf or have difficulty hearing?: No Does the patient have difficulty seeing, even when wearing glasses/contacts?: No Does the patient have  difficulty concentrating, remembering, or making decisions?: No Patient able to express need for assistance with ADLs?: Yes Does the patient have difficulty dressing or bathing?: No Independently performs ADLs?: Yes (appropriate for developmental age) Does the patient have difficulty walking or climbing stairs?: No Weakness of Legs: Both Weakness of Arms/Hands: None  Permission Sought/Granted                  Emotional Assessment Appearance:: Appears stated age Attitude/Demeanor/Rapport: Engaged Affect (typically observed): Accepting Orientation: : Oriented to Self,Oriented to  Time,Oriented to Situation,Oriented to Place Alcohol / Substance Use: Not Applicable Psych Involvement: No (comment)  Admission diagnosis:  AKI (acute kidney injury) ([place]) [N17.9] Leukocytosis, unspecified type [D72.829] Person under investigation for COVID-19 [Z20.822] Patient Active Problem List   Diagnosis Date Noted  . AKI (acute kidney injury) ([place]) 02/05/2020  . Acute lower UTI 02/05/2020  . Flu-like symptoms 02/05/2020  . Type 2 diabetes mellitus with diabetic polyneuropathy, with long-term current use of insulin ([place]) 06/27/2017  . Elevated lipoprotein(a) 03/27/2017  . Acquired hypothyroidism 03/27/2017   PCP:  Kerin Perna, NP Pharmacy:   [facility], [place] [address] [place] [address] Phone: [phone] Fax: [phone]  [id number] [place], [place] [address] Phone: [phone] Fax: [phone]     Social Determinants of Health (SDOH) Interventions    Readmission Risk  Interventions No flowsheet data found.

## 2020-02-09 NOTE — Telephone Encounter (Signed)
Patient niece presents in office trying to schedule HFU for her Aunt who was seen in the hospital today. I scheduled patient in soonest available but niece wanted to know if there was any way she can be worked in sooner since the discharge papers state within 1 week. Patients niece  States she was in ER for AKI. I added patient to waitlist. Please follow up if anything sooner available.

## 2020-02-09 NOTE — Discharge Summary (Signed)
Physician Discharge Summary  Audrey Mcmillan JKD:[id number] DOB: [birth year] DOA: 02/05/2020  PCP: Kerin Perna, NP  Admit date: 02/05/2020 Discharge date: 02/09/2020  Admitted From: Home Disposition:  Home  Discharge Condition:Stable CODE STATUS:FULL Diet recommendation: Carb Modified    Brief/Interim Summary: Patient is a 40 year old female with past medical history of hyperlipidemia, diabetes type 2, hypothyroidism who presents to the emergency department with complaints of fever, nausea, vomiting.  She was also complaining of intermittent shortness of breath with cough, body ache.  Also reported that she had noticed some blood in the urine which has stopped but denied any dysuria, abdominal pain or urinary frequency.  On presentation she was hemodynamically stable.  Creatinine was found to be 4.6, she had severe leukocytosis.  Chest x-ray showed bilateral lower lung volumes.  UA suggestive  of UTI.  Patient was admitted for the management of severe AKI.  Started on IV fluids.  Hospital course remarkable for intermittent fever, finding of left lower lobe pneumonia, leukocytosis .overall status has improved.  Her creatinine is trending down.  She is medically stable for discharge home today.  She will follow-up with nephrology as an outpatient.  Following problems were addressed during her hospitalization:  AKI: Most likely secondary to volume loss from severe nausea vomiting, poor oral intake but also could be from UTI. She also takes lisinopril at home. renal ultrasound did not show any hydronephrosis or any other obstructive pathology/abscess.  Urine sodium is 57.Possible ATN. Started on  aggressive IV fluid resuscitation.She had non-anion gap metabolic acidosis and was started on bicarb drip in sterile water.NAGMA has resolved and started on normal saline. Nephrology consulted. Creatinine is trending down now.Check BMP in a week Nephrology will set up appointment as an  outpatient  Sepsis/fever/urinary tract infection: Urine culture and blood culture did not show any growth.  She was having fever at home, has intermittent fever here.  Denied any dysuria or abdominal pain.  UA grossly suggestive of UTI.  Currently she is afebrile Chest x-ray done on 02/08/2020 showed possible left lower lobe pneumonia.  She still has significant leukocytosis.  Check CBC in a week.  Continue oral antibiotics at home  Nausea/vomiting: Secondary to UTI.Resolved  Uncontrolled diabetes mellitus type 2: Not in DKA.  Takes farxiga, glipizide, Lantus at home.  Monitor blood sugars.  Hemoglobin A1c of 10.2.  Diabetic coordinator consultation was done.  Added NovoLog 6 units 3 times a day with meals on discharge.  She is to follow-up with her PCP closely for better control of her diabetes  Fever/body aches/flulike symptoms: COVID screening test negative.  Flu negative.  These  symptoms have resolved  Hypothyroidism: Continue Synthyroid    Discharge Diagnoses:  Principal Problem:   AKI (acute kidney injury) ([place]) Active Problems:   Acquired hypothyroidism   Type 2 diabetes mellitus with diabetic polyneuropathy, with long-term current use of insulin (HCC)   Acute lower UTI   Flu-like symptoms    Discharge Instructions  Discharge Instructions    Diet Carb Modified   Complete by: As directed    Discharge instructions   Complete by: As directed    1)Please follow-up with your PCP in a week.  Do a CBC, BMP test during the follow-up. 2) please take prescribed medications as instructed 3)Please monitor your blood sugars at home.  Take your insulin as instructed 4)Donot take metformin and lisinopril until your blood  work shows that you have improved kidney function. 5)You will be called by nephrology for follow-up  appointment   Increase activity slowly   Complete by: As directed      Allergies as of 02/09/2020   No Known Allergies     Medication List    STOP taking  these medications   lisinopril 2.5 MG tablet Commonly known as: ZESTRIL     TAKE these medications   atorvastatin 40 MG tablet Commonly known as: LIPITOR Take 40 mg by mouth 2 (two) times daily.   atorvastatin 10 MG tablet Commonly known as: LIPITOR Take 1 tablet (10 mg total) by mouth daily.   azithromycin 500 MG tablet Commonly known as: ZITHROMAX Take 1 tablet (500 mg total) by mouth once for 1 dose. Start taking on: February 10, 2020   cefdinir 300 MG capsule Commonly known as: OMNICEF Take 1 capsule (300 mg total) by mouth 2 (two) times daily for 2 days. Start taking on: February 10, 2020   dapagliflozin propanediol 10 MG Tabs tablet Commonly known as: Wilder Glade Take 1 tablet (10 mg total) by mouth daily before breakfast.   ferrous sulfate 325 (65 FE) MG tablet Take 1 tablet (325 mg total) by mouth daily with breakfast. Start taking on: February 10, 2020   glipiZIDE 10 MG tablet Commonly known as: GLUCOTROL Take 1 tablet (10 mg total) by mouth 2 (two) times daily before a meal.   glucose blood test strip Commonly known as: True Metrix Blood Glucose Test Use as instructed. Check blood glucose levels twice per day.   insulin aspart 100 UNIT/ML injection Commonly known as: novoLOG Inject 6 Units into the skin 3 (three) times daily with meals.   insulin glargine 100 UNIT/ML injection Commonly known as: Lantus Inject 60 units at bedtime and 30 units in the morning. What changed:   how much to take  how to take this  when to take this   INSULIN SYRINGE 1CC/30GX5/16" 30G X 5/16" 1 ML Misc Inject 1 application into the skin 2 times daily at 12 noon and 4 pm.   levothyroxine 150 MCG tablet Commonly known as: SYNTHROID Take 1 tablet (150 mcg total) by mouth daily before breakfast.   metFORMIN 1000 MG tablet Commonly known as: GLUCOPHAGE Take 1 tablet (1,000 mg total) by mouth 2 (two) times daily with a meal. Please donot take this medication until you follow up  with your PCP .Can restart only after you know that your kidney function has improved What changed: additional instructions   True Metrix Meter w/Device Kit Use as instructed   TRUEplus Lancets 28G Misc Use as instructed. Check blood glucose levels twice per day.       Follow-up Information    Kerin Perna, NP. Schedule an appointment as soon as possible for a visit in 1 week(s).   Specialty: Internal Medicine Contact information: [place] [address] [phone]              No Known Allergies  Consultations: Nephrology  Procedures/Studies: DG Chest 2 View  Result Date: 02/04/2020 CLINICAL DATA:  Productive cough, fever EXAM: CHEST - 2 VIEW COMPARISON:  None. FINDINGS: Low lung volumes. Linear densities in the lung bases, likely atelectasis. Heart is normal size. No effusions or acute bony abnormality. IMPRESSION: Low lung volumes, bibasilar atelectasis. Electronically Signed   By: Rolm Baptise M.D.   On: 02/04/2020 23:21   US RENAL  Result Date: 02/05/2020 CLINICAL DATA:  Acute kidney injury in a 40 year old female EXAM: RENAL / URINARY TRACT ULTRASOUND COMPLETE COMPARISON:  CT abdomen and pelvis  from 2015 FINDINGS: Right Kidney: Renal measurements: 13.6 x 7.8 x 6.7 cm = volume: 369 mL. Question renal edema with mild effacement of the central sinus echo complex and renal edema. No hydronephrosis. No signs of nephrolithiasis. Question diminished, mildly diminished corticomedullary differentiation. Proximal ureter may be minimally distended. Left Kidney: Renal measurements: 13.3 x 6.6 x 6.6 cm = volume: 302 mL. Question mild renal edema. Mild decreased corticomedullary distinction. No hydronephrosis or nephrolithiasis. Bladder: Appears normal for degree of bladder distention. Other: None. IMPRESSION: Signs of nephromegaly, could be seen in the setting of diabetic nephropathy. Question renal edema without hydronephrosis. This could be seen in the  setting of pyelonephritis. No perinephric fluid on submitted images. Question of mild RIGHT proximal ureteral distension without hydronephrosis. Attention on follow-up. Electronically Signed   By: Zetta Bills M.D.   On: 02/05/2020 11:55   US Renal  Result Date: 02/05/2020 CLINICAL DATA:  Acute kidney injury. Creatinine of 4.6 on 02/05/2020. EXAM: RENAL / URINARY TRACT ULTRASOUND COMPLETE COMPARISON:  CT abdomen 09/17/2013 FINDINGS: Right Kidney: Renal measurements: 13.6 by 7.8 by 6.7 = volume: 370 mL. Echogenicity within normal limits. No mass or hydronephrosis visualized. Left Kidney: Renal measurements: 13.3 by 6.6 by 6.6 cm = volume: 302 mL. Echogenicity within normal limits. No mass or hydronephrosis visualized. Bladder: Appears normal for degree of bladder distention. A right ureteral jet is incidentally seen on 1 of the images. Other: None. IMPRESSION: 1. No specific sonographic abnormality is identified to account the patient's new renal insufficiency. No renal abscess is seen. Please note that pyelonephritis can be occult on ultrasound. Electronically Signed   By: Van Clines M.D.   On: 02/05/2020 11:48   DG CHEST PORT 1 VIEW  Result Date: 02/08/2020 CLINICAL DATA:  40 year old female with productive cough, fever, body ache, shortness of breath. EXAM: PORTABLE CHEST 1 VIEW COMPARISON:  Chest radiographs 02/04/2020 and earlier. FINDINGS: Portable AP semi upright view at 0908 hours. Lower lung volumes and lordotic view. Mediastinal contours remain within normal limits. Visualized tracheal air column is within normal limits. Patchy new bilateral lung base opacity greater on the left. Mildly increased interstitium elsewhere. No pneumothorax. No definite pleural effusion. Paucity of bowel gas in the upper abdomen. No acute osseous abnormality identified. IMPRESSION: Lower lung volumes with new patchy lung base opacity greater on the left. Favor pneumonia in this setting. Electronically Signed    By: Genevie Ann M.D.   On: 02/08/2020 09:35       Subjective: Patient seen and examined at the bedside this morning.  Medically stable for discharge to home today.  Discharge Exam: Vitals:   02/08/20 2142 02/09/20 0652  BP: (!) 133/59 117/70  Pulse: 96 72  Resp: 18 18  Temp: 99.2 F (37.3 C) 98.3 F (36.8 C)  SpO2: 93% 94%   Vitals:   02/08/20 1300 02/08/20 1424 02/08/20 2142 02/09/20 0652  BP:  (!) 143/75 (!) 133/59 117/70  Pulse:  89 96 72  Resp:  _0 Temp:  98 F (36.7 C) 99.2 F (37.3 C) 98.3 F (36.8 C)  TempSrc:  Oral Oral Oral  SpO2: 93% (!) 89% 93% 94%  Weight:      Height:        General: Pt is alert, awake, not in acute distress Cardiovascular: RRR, S1/S2 +, no rubs, no gallops Respiratory: CTA bilaterally, no wheezing, no rhonchi Abdominal: Soft, NT, ND, bowel sounds + Extremities: no edema, no cyanosis    The results of  significant diagnostics from this hospitalization (including imaging, microbiology, ancillary and laboratory) are listed below for reference.     Microbiology: Recent Results (from the past 240 hour(s))  SARS CORONAVIRUS 2 (TAT 6-24 HRS) Nasopharyngeal Nasopharyngeal Swab     Status: None   Collection Time: 02/05/20  6:57 AM   Specimen: Nasopharyngeal Swab  Result Value Ref Range Status   SARS Coronavirus 2 NEGATIVE NEGATIVE Final    Comment: (NOTE) SARS-CoV-2 target nucleic acids are NOT DETECTED.  The SARS-CoV-2 RNA is generally detectable in upper and lower respiratory specimens during the acute phase of infection. Negative results do not preclude SARS-CoV-2 infection, do not rule out co-infections with other pathogens, and should not be used as the sole basis for treatment or other patient management decisions. Negative results must be combined with clinical observations, patient history, and epidemiological information. The expected result is Negative.  Fact Sheet for  Patients: SugarRoll.be  Fact Sheet for Healthcare Providers: https://www.woods-mathews.com/  This test is not yet approved or cleared by the [place] FDA and  has been authorized for detection and/or diagnosis of SARS-CoV-2 by FDA under an Emergency Use Authorization (EUA). This EUA will remain  in effect (meaning this test can be used) for the duration of the COVID-19 declaration under Se ction 564(b)(1) of the Act, 21 U.S.C. section 360bbb-3(b)(1), unless the authorization is terminated or revoked sooner.  Performed at [facility], [place] [address], [place], [place] [address]   Culture, blood (routine x 2)     Status: None (Preliminary result)   Collection Time: 02/07/20  8:17 AM   Specimen: BLOOD  Result Value Ref Range Status   Specimen Description   Final    BLOOD BLOOD LEFT HAND Performed at [facility] [address], [place], [place] [address]    Special Requests   Final    BOTTLES DRAWN AEROBIC ONLY Blood Culture adequate volume Performed at [facility] [address], [place], [place] [address]    Culture   Final    NO GROWTH 2 DAYS Performed at [facility] [address], [place], [place] [address]    Report Status PENDING  Incomplete  Culture, blood (routine x 2)     Status: None (Preliminary result)   Collection Time: 02/07/20  8:26 AM   Specimen: BLOOD  Result Value Ref Range Status   Specimen Description   Final    BLOOD BLOOD RIGHT HAND Performed at [facility] [address], [place], [place] [address]    Special Requests   Final    BOTTLES DRAWN AEROBIC ONLY Blood Culture adequate volume Performed at [facility] [address] [place], [place] [address]    Culture   Final    NO GROWTH 2 DAYS Performed at [facility] [address] [place], [place] [address]    Report Status PENDING  Incomplete   Culture, Urine     Status: None   Collection Time: 02/07/20 10:37 AM   Specimen: Urine, Random  Result Value Ref Range Status   Specimen Description   Final    URINE, RANDOM Performed at [facility] [address], [place], [place] [address]    Special Requests   Final    NONE Performed at [facility], [place] [address], [place], [place] [address]    Culture   Final    NO GROWTH Performed at [facility],  [address], [place], [place] [address]    Report Status 02/09/2020 FINAL  Final     Labs: BNP (last 3 results) No results for input(s): BNP in the last 8760 hours. Basic Metabolic Panel: Recent Labs  Lab 02/05/20 0657 02/06/20 0616 02/07/20 0543 02/08/20 0531 02/09/20 0509  NA 132* 133* 131* 135 132*  K 5.2* 5.1 4.5 5.1 4.5  CL 98 104 94* 97* 98  CO2 23 19* 26 20* 27  GLUCOSE 337* 165* 224* 109* 122*  BUN 44* 44* 45* 37* 34*  CREATININE 4.61* 4.13* 4.68* 4.12* 3.52*  CALCIUM 8.9 8.1* 7.6* 7.8* 7.9*   Liver Function Tests: Recent Labs  Lab 02/05/20 0657  AST 17  ALT 17  ALKPHOS 167*  BILITOT 0.4  PROT 7.8  ALBUMIN 2.5*   No results for input(s): LIPASE, AMYLASE in the last 168 hours. No results for input(s): AMMONIA in the last 168 hours. CBC: Recent Labs  Lab 02/05/20 0657 02/06/20 0616 02/07/20 0543 02/08/20 0531 02/09/20 0509  WBC 25.4* 22.0* 23.0* 24.8* 19.3*  NEUTROABS 22.1*  --   --   --   --   HGB 10.8* 9.5* 8.6* 8.4* 7.4*  HCT 34.0* 29.8* 25.8* 26.9* 23.0*  MCV 88.5 89.8 86.3 90.3 87.5  PLT 338 311 334 349 367   Cardiac Enzymes: No results for input(s): CKTOTAL, CKMB, CKMBINDEX, TROPONINI in the last 168 hours. BNP: Invalid input(s): POCBNP CBG: Recent Labs  Lab 02/08/20 1211 02/08/20 1616 02/08/20 2137 02/09/20 0752 02/09/20 1144  GLUCAP 218* 154* 142* 122* 171*   D-Dimer No results for input(s): DDIMER in the last 72 hours. Hgb A1c No results for input(s): HGBA1C in the last  72 hours. Lipid Profile No results for input(s): CHOL, HDL, LDLCALC, TRIG, CHOLHDL, LDLDIRECT in the last 72 hours. Thyroid function studies No results for input(s): TSH, T4TOTAL, T3FREE, THYROIDAB in the last 72 hours.  Invalid input(s): FREET3 Anemia work up Recent Labs    02/09/20 0923  FERRITIN 198  TIBC 195*  IRON 21*   Urinalysis    Component Value Date/Time   COLORURINE YELLOW 02/05/2020 [place] (A) 02/05/2020 0657   LABSPEC 1.007 02/05/2020 0657   PHURINE 5.0 02/05/2020 0657   GLUCOSEU >=500 (A) 02/05/2020 0657   HGBUR MODERATE (A) 02/05/2020 0657   BILIRUBINUR NEGATIVE 02/05/2020 0657   KETONESUR NEGATIVE 02/05/2020 0657   PROTEINUR NEGATIVE 02/05/2020 0657   UROBILINOGEN 0.2 09/17/2013 1636   NITRITE POSITIVE (A) 02/05/2020 0657   LEUKOCYTESUR LARGE (A) 02/05/2020 0657   Sepsis Labs Invalid input(s): PROCALCITONIN,  WBC,  LACTICIDVEN Microbiology Recent Results (from the past 240 hour(s))  SARS CORONAVIRUS 2 (TAT 6-24 HRS) Nasopharyngeal Nasopharyngeal Swab     Status: None   Collection Time: 02/05/20  6:57 AM   Specimen: Nasopharyngeal Swab  Result Value Ref Range Status   SARS Coronavirus 2 NEGATIVE NEGATIVE Final    Comment: (NOTE) SARS-CoV-2 target nucleic acids are NOT DETECTED.  The SARS-CoV-2 RNA is generally detectable in upper and lower respiratory specimens during the acute phase of infection. Negative results do not preclude SARS-CoV-2 infection, do not rule out co-infections with other pathogens, and should not be used as the sole basis for treatment or other patient management decisions. Negative results must be combined with clinical observations, patient history, and epidemiological information. The expected result is Negative.  Fact Sheet for Patients: SugarRoll.be  Fact Sheet for Healthcare Providers: https://www.woods-mathews.com/  This test is not yet approved or cleared  by  the [place] and  has been authorized for detection and/or diagnosis of SARS-CoV-2 by FDA under an Emergency Use Authorization (EUA). This EUA will remain  in effect (meaning this test can be used) for the duration of the COVID-19 declaration under Se ction 564(b)(1) of the Act, 21 U.S.C. section 360bbb-3(b)(1), unless the authorization is terminated or revoked sooner.  Performed at [facility], [place] [address] [place] [address] [place], [place] [address]   Culture, blood (routine x 2)     Status: None (Preliminary result)   Collection Time: 02/07/20  8:17 AM   Specimen: BLOOD  Result Value Ref Range Status   Specimen Description   Final    BLOOD BLOOD LEFT HAND Performed at [facility] [address], [place], [place] [address]    Special Requests   Final    BOTTLES DRAWN AEROBIC ONLY Blood Culture adequate volume Performed at [facility] [address] [place], [place] [address]    Culture   Final    NO GROWTH 2 DAYS Performed at [facility] [address] [place], [place] [address]    Report Status PENDING  Incomplete  Culture, blood (routine x 2)     Status: None (Preliminary result)   Collection Time: 02/07/20  8:26 AM   Specimen: BLOOD  Result Value Ref Range Status   Specimen Description   Final    BLOOD BLOOD RIGHT HAND Performed at [facility] [address], [place], [place] [address]    Special Requests   Final    BOTTLES DRAWN AEROBIC ONLY Blood Culture adequate volume Performed at [facility] [address], [place], [place] [address]    Culture   Final    NO GROWTH 2 DAYS Performed at [facility] [address], [place], [place] [address]    Report Status PENDING  Incomplete  Culture, Urine     Status: None   Collection Time: 02/07/20 10:37 AM   Specimen: Urine, Random  Result Value Ref Range Status   Specimen Description   Final    URINE,  RANDOM Performed at [facility] [address] [place], [place] [address]    Special Requests   Final    NONE Performed at [facility], [place] [address], [place], [place] [address]    Culture   Final    NO GROWTH Performed at [facility], [place] [address] [place],  [address]    Report Status 02/09/2020 FINAL  Final    Please note: You were cared for by a hospitalist during your hospital stay. Once you are discharged, your primary care physician will handle any further medical issues. Please note that NO REFILLS for any discharge medications will be authorized once you are discharged, as it is imperative that you return to your primary care physician (or establish a relationship with a primary care physician if you do not have one) for your post hospital discharge needs so that they can reassess your need for medications and monitor your lab values.    Time coordinating discharge: 40 minutes  SIGNED:   Shelly Coss, MD  [facility] 02/09/2020, 12:17 PM Pager [phone]  If 7PM-7AM, please contact night-coverage [email or web address] Password [email or web address]

## 2020-02-10 ENCOUNTER — Telehealth: Payer: Self-pay

## 2020-02-10 NOTE — Telephone Encounter (Signed)
Transition Care Management Unsuccessful Follow-up Telephone Call   call placed with assistance of [employer] # [id number] Date of discharge and from where:  02/09/2020, [facility]   Attempts:  1st Attempt  Reason for unsuccessful TCM follow-up call:  Left voice message - call placed to # [phone]   Patient has appointment with Juluis Mire, NP on 02/24/2020

## 2020-02-11 ENCOUNTER — Telehealth: Payer: Self-pay

## 2020-02-11 NOTE — Telephone Encounter (Signed)
Transition Care Management Unsuccessful Follow-up Telephone Call  Call placed with assistance of Spanish Interpreter # [id number]  Date of discharge and from where:  02/09/2020, [facility]   Attempts:  2nd Attempt  Reason for unsuccessful TCM follow-up call:  Left voice message - call placed to # [phone].  Patient has appointment with Juluis Mire, NP 02/24/2020

## 2020-02-12 LAB — CULTURE, BLOOD (ROUTINE X 2)
Culture: NO GROWTH
Culture: NO GROWTH
Special Requests: ADEQUATE
Special Requests: ADEQUATE

## 2020-02-24 ENCOUNTER — Ambulatory Visit (INDEPENDENT_AMBULATORY_CARE_PROVIDER_SITE_OTHER): Payer: Self-pay | Admitting: Primary Care

## 2020-02-24 ENCOUNTER — Other Ambulatory Visit: Payer: Self-pay

## 2020-02-24 ENCOUNTER — Encounter (INDEPENDENT_AMBULATORY_CARE_PROVIDER_SITE_OTHER): Payer: Self-pay | Admitting: Primary Care

## 2020-02-24 VITALS — BP 131/83 | HR 81 | Temp 97.3°F | Ht 62.0 in | Wt 146.0 lb

## 2020-02-24 DIAGNOSIS — I10 Essential (primary) hypertension: Secondary | ICD-10-CM

## 2020-02-24 DIAGNOSIS — Z794 Long term (current) use of insulin: Secondary | ICD-10-CM

## 2020-02-24 DIAGNOSIS — Z09 Encounter for follow-up examination after completed treatment for conditions other than malignant neoplasm: Secondary | ICD-10-CM

## 2020-02-24 DIAGNOSIS — E039 Hypothyroidism, unspecified: Secondary | ICD-10-CM

## 2020-02-24 DIAGNOSIS — E1142 Type 2 diabetes mellitus with diabetic polyneuropathy: Secondary | ICD-10-CM

## 2020-02-24 DIAGNOSIS — E785 Hyperlipidemia, unspecified: Secondary | ICD-10-CM

## 2020-02-24 MED ORDER — INSULIN GLARGINE 100 UNIT/ML ~~LOC~~ SOLN: SUBCUTANEOUS | 1 refills | Status: DC

## 2020-02-24 MED FILL — $LANTUS 100 UNITS/ML VIAL: 100 | 88 days supply | Qty: 80 | Fill #0

## 2020-02-24 NOTE — Progress Notes (Signed)
HPI [name] is a 40 y.o.female presents for follow up from the hospital. Admit date to the hospital was 02/05/20, patient was discharged from the hospital on 02/09/20, patient was admitted for: Acquired hypothyroidism, Type 2 diabetes mellitus with diabetic polyneuropathy, with long-term current use of insulin ([place]), AKI (acute kidney injury) ([place]), and Acute lower UTI and Flu-like symptoms. Continues to feel fatigue and loss of taste.  Type 2 diabetes DIABETES, Hypoglycemic episodes:no, Polydipsia/polyuria: yes, Visual disturbance: no, Chest pain: no, Paresthesias: no Glucose Monitoring: no  Past Medical History:  Diagnosis Date  . Diabetes mellitus without complication ([place])   . High cholesterol      No Known Allergies    Current Outpatient Medications on File Prior to Visit  Medication Sig Dispense Refill  . atorvastatin (LIPITOR) 10 MG tablet Take 1 tablet (10 mg total) by mouth daily. 90 tablet 3  . atorvastatin (LIPITOR) 40 MG tablet Take 40 mg by mouth 2 (two) times daily.    . Blood Glucose Monitoring Suppl (TRUE METRIX METER) w/Device KIT Use as instructed 1 kit 0  . dapagliflozin propanediol (FARXIGA) 10 MG TABS tablet Take 1 tablet (10 mg total) by mouth daily before breakfast. 90 tablet 1  . ferrous sulfate 325 (65 FE) MG tablet Take 1 tablet (325 mg total) by mouth daily with breakfast. 30 tablet 1  . glipiZIDE (GLUCOTROL) 10 MG tablet Take 1 tablet (10 mg total) by mouth 2 (two) times daily before a meal. 180 tablet 1  . glucose blood (TRUE METRIX BLOOD GLUCOSE TEST) test strip Use as instructed. Check blood glucose levels twice per day. 100 each 12  . insulin aspart (NOVOLOG) 100 UNIT/ML injection Inject 6 Units into the skin 3 (three) times daily with meals. 10 mL 0  . insulin glargine (LANTUS) 100 UNIT/ML injection Inject 60 units at bedtime and 30 units in the morning. (Patient taking differently: Inject 30-60 Units into the skin 2 (two) times daily. Inject  60 units at bedtime and 30 units in the morning.) 90 mL 1  . Insulin Syringe-Needle U-100 (INSULIN SYRINGE 1CC/30GX5/16") 30G X 5/16" 1 ML MISC Inject 1 application into the skin 2 times daily at 12 noon and 4 pm. 100 each 5  . levothyroxine (SYNTHROID) 150 MCG tablet Take 1 tablet (150 mcg total) by mouth daily before breakfast. 90 tablet 1  . metFORMIN (GLUCOPHAGE) 1000 MG tablet Take 1 tablet (1,000 mg total) by mouth 2 (two) times daily with a meal. Please donot take this medication until you follow up with your PCP .Can restart only after you know that your kidney function has improved 180 tablet 1  . TRUEplus Lancets 28G MISC Use as instructed. Check blood glucose levels twice per day. 100 each 3   No current facility-administered medications on file prior to visit.    ROS: all negative except above.   Physical Exam: There were no vitals filed for this visit. General Appearance: Well nourished, in no apparent distress. Eyes: PERRLA, EOMs,  Sinuses: No Frontal/maxillary tenderness ENT/Mouth: Ext aud canals clear, TMs without erythema, bulging. Hearing normal.  Neck: Supple, thyroid normal.  Respiratory: Respiratory effort normal, BS equal bilaterally without rales, rhonchi, wheezing or stridor.  Cardio: RRR with no MRGs. Brisk peripheral pulses without edema.  Abdomen: Soft, + BS.  Non tender, no guarding, rebound, hernias, masses. Lymphatics: Non tender without lymphadenopathy.  Musculoskeletal: Full ROM, 5/5 strength, normal gait.  Skin: Warm, dry without rashes, lesions, ecchymosis.  Neuro: Cranial nerves  intact. Normal muscle tone, no cerebellar symptoms. Sensation intact.  Psych: Awake and oriented X 3, normal affect, Insight and Judgment appropriate.    Rashay was seen today for hospitalization follow-up.  Diagnoses and all orders for this visit:  Essential hypertension Counseled on blood pressure goal of less than 130/80, low-sodium, DASH diet, medication compliance, 150  minutes of moderate intensity exercise per week. Discussed medication compliance, adverse effects.  Type 2 diabetes mellitus with diabetic polyneuropathy, with long-term current use of insulin ([facility])  Goal of therapy: Less than 6.5 hemoglobin A1c.  Monitor foods that are high in carbohydrates are the following rice, potatoes, breads, sugars, and pastas.  Reduction in the intake (eating) will assist in lowering your blood sugars. Discuss complications from uncontrolled diabetes -diabetic retinopathy leading to blindness, diabetic nephropathy leading to dialysis, decrease in circulation decrease in sores or wound healing which may lead to amputations and increase of heart attack and stroke  Hospital discharge follow-up Patient admit date to the hospital was 02/05/20, patient was discharged from the hospital on 02/09/20,1. Retrieve from hospital discharge Please follow-up with your PCP in a week.  Do a CBC, BMP test during the follow-up. 2) please take prescribed medications as instructed 3)Please monitor your blood sugars at home.  Take your insulin as instructed 4)Donot take metformin and lisinopril until your blood  work shows that you have improved kidney function. 5)You will be called by nephrology for follow-up appointmen  Hypothyroidism, unspecified type Continue levothyroxine (SYNTHROID) 150 MCG tablet Future TSH/T4  Other orders -     insulin glargine (LANTUS) 100 UNIT/ML injection; Inject 60 units at bedtime and 30 units in the morning.    Kerin Perna, NP 9:39 AM

## 2020-02-24 NOTE — Patient Instructions (Signed)
Remember   Discussed  co- morbidities with uncontrol diabetes  Complications -diabetic retinopathy, (close your eyes ? What do you see nothing) nephropathy decrease in kidney function- can lead to dialysis-on a machine 3 days a week to filter your kidney, neuropathy- numbness and tinging in your hands and feet,  increase risk of heart attack and stroke, and amputation due to decrease wound healing and circulation. Decrease your risk by taking medication daily as prescribed, monitor carbohydrates- foods that are high in carbohydrates are the following rice, potatoes, breads, sugars, and pastas.  Reduction in the intake (eating) will assist in lowering your blood sugars. Exercise daily at least 30 minutes daily. When you return you will tell me complications from uncontrolled Type 2 diabetes.

## 2020-02-27 ENCOUNTER — Other Ambulatory Visit (INDEPENDENT_AMBULATORY_CARE_PROVIDER_SITE_OTHER): Payer: Self-pay | Admitting: Primary Care

## 2020-02-27 MED ORDER — LEVOTHYROXINE SODIUM 150 MCG PO TABS: 150.0000 ug | ORAL_TABLET | Freq: Every day | ORAL | 1 refills | Status: DC

## 2020-03-09 MED FILL — $LANTUS 100 UNITS/ML VIAL: 100 | 88 days supply | Qty: 80 | Fill #0

## 2020-03-10 ENCOUNTER — Other Ambulatory Visit: Payer: Self-pay | Admitting: Nephrology

## 2020-03-10 MED FILL — SULFAMETHOXAZOLE-TMP DS TAB: 800-160 | 5 days supply | Qty: 10 | Fill #0

## 2020-03-16 ENCOUNTER — Ambulatory Visit (INDEPENDENT_AMBULATORY_CARE_PROVIDER_SITE_OTHER): Payer: Self-pay | Admitting: Primary Care

## 2020-05-13 ENCOUNTER — Other Ambulatory Visit (INDEPENDENT_AMBULATORY_CARE_PROVIDER_SITE_OTHER): Payer: Self-pay | Admitting: Primary Care

## 2020-05-13 DIAGNOSIS — E039 Hypothyroidism, unspecified: Secondary | ICD-10-CM

## 2020-05-13 MED ORDER — LEVOTHYROXINE SODIUM 150 MCG PO TABS
ORAL_TABLET | ORAL | 1 refills | Status: DC
  Filled 2020-05-13: qty 30, 30d supply, fill #0

## 2020-05-13 NOTE — Telephone Encounter (Signed)
Copied from [place] [id number]. Topic: Quick Communication - Rx Refill/Question >> May 13, 2020  3:35 PM Mcneil, Ja-Kwan wrote: Medication: levothyroxine (SYNTHROID) 150 MCG tablet  Has the patient contacted their pharmacy? yes - Pt told to contact pcp  Preferred Pharmacy (with phone number or street name): [facility] [place] Phone: [phone]   Fax: [phone]  Agent: Please be advised that RX refills may take up to 3 business days. We ask that you follow-up with your pharmacy.

## 2020-05-16 ENCOUNTER — Other Ambulatory Visit: Payer: Self-pay

## 2020-05-17 ENCOUNTER — Other Ambulatory Visit: Payer: Self-pay

## 2020-05-23 ENCOUNTER — Ambulatory Visit (INDEPENDENT_AMBULATORY_CARE_PROVIDER_SITE_OTHER): Payer: Self-pay | Admitting: Primary Care

## 2020-05-24 ENCOUNTER — Ambulatory Visit (INDEPENDENT_AMBULATORY_CARE_PROVIDER_SITE_OTHER): Payer: Self-pay | Admitting: Primary Care

## 2020-05-24 ENCOUNTER — Other Ambulatory Visit: Payer: Self-pay

## 2020-05-31 ENCOUNTER — Telehealth (INDEPENDENT_AMBULATORY_CARE_PROVIDER_SITE_OTHER): Payer: Self-pay | Admitting: *Deleted

## 2020-05-31 DIAGNOSIS — O24319 Unspecified pre-existing diabetes mellitus in pregnancy, unspecified trimester: Secondary | ICD-10-CM

## 2020-05-31 DIAGNOSIS — Z603 Acculturation difficulty: Secondary | ICD-10-CM | POA: Insufficient documentation

## 2020-05-31 DIAGNOSIS — O0990 Supervision of high risk pregnancy, unspecified, unspecified trimester: Secondary | ICD-10-CM | POA: Insufficient documentation

## 2020-05-31 DIAGNOSIS — E1142 Type 2 diabetes mellitus with diabetic polyneuropathy: Secondary | ICD-10-CM

## 2020-05-31 DIAGNOSIS — O09529 Supervision of elderly multigravida, unspecified trimester: Secondary | ICD-10-CM

## 2020-05-31 DIAGNOSIS — Z794 Long term (current) use of insulin: Secondary | ICD-10-CM

## 2020-05-31 DIAGNOSIS — Z789 Other specified health status: Secondary | ICD-10-CM

## 2020-05-31 NOTE — Progress Notes (Signed)
11:20 Patient not connected virtually for new ob intake video visit. Per chart referred or prenatal care from [facility]- they did pregnancy confirmation only.  Records not available but registar will call for records.  Started video visit and called patient with [place]  Interpreter # [phone] and left message we are calling about her appointment and will call back in a moment, please answer. Called back a second time with Interpreter and left message we were calling about your appointment and it will need to be rescheduled. You will be contacted. Dezaree Tracey,RN

## 2020-06-02 ENCOUNTER — Ambulatory Visit: Payer: Self-pay | Admitting: Registered"

## 2020-06-02 ENCOUNTER — Encounter: Payer: Self-pay | Attending: Family Medicine | Admitting: Registered"

## 2020-06-02 ENCOUNTER — Other Ambulatory Visit: Payer: Self-pay

## 2020-06-02 DIAGNOSIS — O24319 Unspecified pre-existing diabetes mellitus in pregnancy, unspecified trimester: Secondary | ICD-10-CM

## 2020-06-02 DIAGNOSIS — Z3A08 8 weeks gestation of pregnancy: Secondary | ICD-10-CM | POA: Insufficient documentation

## 2020-06-02 DIAGNOSIS — O24119 Pre-existing diabetes mellitus, type 2, in pregnancy, unspecified trimester: Secondary | ICD-10-CM | POA: Insufficient documentation

## 2020-06-02 NOTE — Progress Notes (Signed)
Interpreter services provided by Donnie Aho [phone] from [facility] video. Patient was able to understand and was answering questions before Interpreter provided service. Interpreter stayed on for awhile but not the entire visit.  Patient was seen on 06/02/20 for Type 2 Diabetes in pregnancy self-management. EDD ? - new OB patient. Patient states no history of diabetes in pregnancy. A1c 10.2% on 02/05/2020 Diabetes Medications: althought there are several active diabetes medications in her chart, pt reports currently medidcation for DM she uses: Lantus 60 units at bedtime and 30 units in the morning.  Metformin 500 mg daily, time taken varies  Diet history obtained. Patient reported limited diet, reports oatmeal, fruit and cereal. Pt states she stopped eating tortillas and rice due to diabetes.  Pt states she drank 24 oz regular soda ~1 hour before visit. POC CBG 409 mg/dL. RD provided patient with water and sent IM to clinical staff> Dr. Ilda Basset stated she needs to contact her provider who prescribed insulin since she has not seen an MD here yet. Patient has appointment with Juluis Mire, NP tomorrow. RD also sent IM message to NP.  The following learning objectives were met by the patient :   States proper insulin injection technique, insulin storage, site rotation, needle disposal.  States difference of basal and bolus insulin  States why dietary management is important in controlling blood glucose  Describes the effects of carbohydrates on blood glucose levels  States when to check blood glucose levels  Demonstrates proper blood glucose monitoring techniques  Plan:  Begin checking Blood Glucose before breakfast and 2 hours after first bite of breakfast, lunch and dinner as directed by MD  Bring Log Book/Sheet and meter to every medical appointment  Take medication as directed by MD Eat balanced meals include protein at every meal and vegetables daily Avoid sweetened beverages  Blood  glucose monitor given: [employer] Lot # [id number] CBG: 409 mg/dL  Patient instructed to monitor glucose levels: FBS: 70 - 95 mg/dl 2 hour: <120 mg/dl  Patient received the following handouts:  Nutrition Diabetes and Pregnancy  Carbohydrate Counting List  Blood glucose Log Sheet  Basal and Bolus insulin graph  Patient will be seen for follow-up in 3 weeks or as needed.

## 2020-06-03 ENCOUNTER — Ambulatory Visit (INDEPENDENT_AMBULATORY_CARE_PROVIDER_SITE_OTHER): Payer: Self-pay | Admitting: Primary Care

## 2020-06-03 ENCOUNTER — Encounter (INDEPENDENT_AMBULATORY_CARE_PROVIDER_SITE_OTHER): Payer: Self-pay | Admitting: Primary Care

## 2020-06-03 ENCOUNTER — Other Ambulatory Visit: Payer: Self-pay

## 2020-06-03 VITALS — BP 126/73 | HR 75 | Temp 97.5°F | Ht 62.0 in | Wt 155.0 lb

## 2020-06-03 DIAGNOSIS — Z794 Long term (current) use of insulin: Secondary | ICD-10-CM

## 2020-06-03 DIAGNOSIS — E1142 Type 2 diabetes mellitus with diabetic polyneuropathy: Secondary | ICD-10-CM

## 2020-06-03 DIAGNOSIS — E039 Hypothyroidism, unspecified: Secondary | ICD-10-CM

## 2020-06-03 DIAGNOSIS — I10 Essential (primary) hypertension: Secondary | ICD-10-CM

## 2020-06-03 DIAGNOSIS — E785 Hyperlipidemia, unspecified: Secondary | ICD-10-CM

## 2020-06-03 LAB — POCT GLYCOSYLATED HEMOGLOBIN (HGB A1C): Hemoglobin A1C: 10.4 % — AB (ref 4.0–5.6)

## 2020-06-03 MED ORDER — INSULIN DETEMIR 100 UNIT/ML ~~LOC~~ SOLN
10.0000 [IU] | Freq: Every day | SUBCUTANEOUS | 2 refills | Status: DC
  Filled 2020-06-03: qty 10, 28d supply, fill #0

## 2020-06-03 NOTE — Progress Notes (Signed)
Established Patient Office Visit  Subjective:  Patient ID: Audrey Mcmillan, female    DOB: [birth year]  Age: 40 y.o. MRN: [id number]  CC:  Chief Complaint  Patient presents with  . Diabetes    HPI Audrey Mcmillan is a 40 year old female who is [redacted] weeks pregnant  presents for management of type 2 diabetes. Recommended tx during pregnancy is insulin 70/30 or levimir dosing twice a day. A1C 10.4  Past Medical History:  Diagnosis Date  . Diabetes mellitus without complication ([place])    type 2  . High cholesterol   . Hyperthyroidism     Past Surgical History:  Procedure Laterality Date  . NO PAST SURGERIES      Family History  Family history unknown: Yes     Outpatient Medications Prior to Visit  Medication Sig Dispense Refill  . Blood Glucose Monitoring Suppl (TRUE METRIX METER) w/Device KIT Use as instructed 1 kit 0  . ferrous sulfate 325 (65 FE) MG tablet TAKE 1 TABLET (325 MG TOTAL) BY MOUTH DAILY WITH BREAKFAST. 30 tablet 1  . glucose blood (TRUE METRIX BLOOD GLUCOSE TEST) test strip Use as instructed. Check blood glucose levels twice per day. 100 each 12  . Insulin Syringe-Needle U-100 (INSULIN SYRINGE 1CC/30GX5/16") 30G X 5/16" 1 ML MISC Inject 1 application into the skin 2 times daily at 12 noon and 4 pm. 100 each 5  . levothyroxine (SYNTHROID) 150 MCG tablet TAKE 1 TABLET (150 MCG TOTAL) BY MOUTH DAILY BEFORE BREAKFAST. 30 tablet 1  . TRUEplus Lancets 28G MISC Use as instructed. Check blood glucose levels twice per day. 100 each 3  . atorvastatin (LIPITOR) 10 MG tablet TAKE 1 TABLET (10 MG TOTAL) BY MOUTH DAILY. 90 tablet 3  . atorvastatin (LIPITOR) 40 MG tablet Take 40 mg by mouth 2 (two) times daily.    Marland Kitchen azithromycin (ZITHROMAX) 500 MG tablet TAKE 1 TABLET (500 MG TOTAL) BY MOUTH ONCE FOR 1 DOSE. 1 tablet 0  . cefdinir (OMNICEF) 300 MG capsule TAKE 1 CAPSULE (300 MG TOTAL) BY MOUTH 2 (TWO) TIMES DAILY FOR 2 DAYS. 4 capsule 0  . dapagliflozin  propanediol (FARXIGA) 10 MG TABS tablet Take 1 tablet (10 mg total) by mouth daily before breakfast. 90 tablet 1  . glipiZIDE (GLUCOTROL) 10 MG tablet Take 1 tablet (10 mg total) by mouth 2 (two) times daily before a meal. 180 tablet 1  . insulin glargine (LANTUS) 100 UNIT/ML injection Inject 60 units at bedtime and 30 units in the morning. 90 mL 1  . metFORMIN (GLUCOPHAGE) 1000 MG tablet Take 1 tablet (1,000 mg total) by mouth 2 (two) times daily with a meal. Please donot take this medication until you follow up with your PCP .Can restart only after you know that your kidney function has improved 180 tablet 1  . sulfamethoxazole-trimethoprim (BACTRIM DS) 800-160 MG tablet TAKE 1 TABLET BY MOUTH TWO TIMES DAILY 10 tablet 0   No facility-administered medications prior to visit.    No Known Allergies  ROS Review of Systems  Genitourinary: Positive for menstrual problem.  All other systems reviewed and are negative.     Objective:    BP 126/73 (BP Location: Right Arm, Patient Position: Sitting, Cuff Size: Normal)   Pulse 75   Temp (!) 97.5 F (36.4 C) (Temporal)   Ht _0  (1.575 m)   Wt 155 lb (70.3 kg)   LMP 02/04/2020 (Approximate)   SpO2 99%   BMI 28.35  kg/m  Wt Readings from Last 3 Encounters:  06/06/20 152 lb (68.9 kg)  06/03/20 155 lb (70.3 kg)  02/24/20 146 lb (66.2 kg)   General: No apparent distress. Eyes: Extraocular eye movements intact, pupils equal and round. Neck: Supple, trachea midline. Thyroid: No enlargement, mobile without fixation, no tenderness. Cardiovascular: Regular rhythm and rate, no murmur, normal radial pulses. Respiratory: Normal respiratory effort, clear to auscultation. Gastrointestinal: Normal pitch active bowel sounds, nontender abdomen without distention or appreciable hepatomegaly. Neurologic: normal deep tendon reflexes Musculoskeletal: Normal muscle tone, no tenderness on palpation of tibia, no excessive thoracic kyphosis. Skin:  Appropriate warmth, no visible rash. Mental status: Alert, conversant, speech clear, thought logical, appropriate mood and affect, no hallucinations or delusions evident. Hematologic/lymphatic: No cervical adenopathy, no visible ecchymoses.  Health Maintenance Due  Topic Date Due  . OPHTHALMOLOGY EXAM  Never done  . COVID-19 Vaccine (2 - Booster for [employer] series) 09/03/2019  . PAP SMEAR-Modifier  03/27/2020    There are no preventive care reminders to display for this patient.  Lab Results  Component Value Date   TSH 26.700 (H) 06/03/2020   Lab Results  Component Value Date   WBC 20.7 (H) 06/07/2020   HGB 9.5 (L) 06/07/2020   HCT 29.1 (L) 06/07/2020   MCV 89.0 06/07/2020   PLT 280 06/07/2020   Lab Results  Component Value Date   NA 136 06/07/2020   K 3.0 (L) 06/07/2020   CO2 23 06/07/2020   GLUCOSE 81 06/07/2020   BUN 15 06/07/2020   CREATININE 1.16 (H) 06/07/2020   BILITOT 0.3 06/07/2020   ALKPHOS 51 06/07/2020   AST 11 (L) 06/07/2020   ALT 12 06/07/2020   PROT 6.3 (L) 06/07/2020   ALBUMIN 2.5 (L) 06/07/2020   CALCIUM 9.0 06/07/2020   ANIONGAP 10 06/07/2020   EGFR 69 06/03/2020   Lab Results  Component Value Date   CHOL 168 06/03/2020   Lab Results  Component Value Date   HDL 46 06/03/2020   Lab Results  Component Value Date   LDLCALC 82 06/03/2020   Lab Results  Component Value Date   TRIG 240 (H) 06/03/2020   Lab Results  Component Value Date   CHOLHDL 3.7 06/03/2020   Lab Results  Component Value Date   HGBA1C 10.4 (A) 06/03/2020      Assessment & Plan:  Audrey Mcmillan was seen today for diabetes.  Diagnoses and all orders for this visit:  Type 2 diabetes mellitus with diabetic polyneuropathy, with long-term current use of insulin ([place]) Start  Levimir 10 units daily f/u with clinical pharmacist. Discontinued all previous diabetes medication .  -     HgB A1c 10 -     Cancel: CBC with Differential -     CBC with Differential;  Future  Hypothyroidism, unspecified type Currently on Levoxine 166mg daily  -     Cancel: CBC with Differential -     CBC with Differential; Future -     TSH + free T4; Future  Other orders -     insulin detemir (LEVEMIR) 100 UNIT/ML injection; Inject 0.1 mLs (10 Units total) into the skin daily.   Meds ordered this encounter  Medications  . insulin detemir (LEVEMIR) 100 UNIT/ML injection    Sig: Inject 0.1 mLs (10 Units total) into the skin daily.    Dispense:  10 mL    Refill:  2    Follow-up: No follow-ups on file.    MKerin Perna NP

## 2020-06-04 LAB — TSH+FREE T4
Free T4: 0.33 ng/dL — ABNORMAL LOW (ref 0.82–1.77)
TSH: 26.7 u[IU]/mL — ABNORMAL HIGH (ref 0.450–4.500)

## 2020-06-04 LAB — CBC WITH DIFFERENTIAL/PLATELET
Basophils Absolute: 0.1 10*3/uL (ref 0.0–0.2)
Basos: 1 %
EOS (ABSOLUTE): 0.1 10*3/uL (ref 0.0–0.4)
Eos: 1 %
Hematocrit: 36.2 % (ref 34.0–46.6)
Hemoglobin: 11.8 g/dL (ref 11.1–15.9)
Immature Grans (Abs): 0.1 10*3/uL (ref 0.0–0.1)
Immature Granulocytes: 1 %
Lymphocytes Absolute: 4.3 10*3/uL — ABNORMAL HIGH (ref 0.7–3.1)
Lymphs: 32 %
MCH: 28.4 pg (ref 26.6–33.0)
MCHC: 32.6 g/dL (ref 31.5–35.7)
MCV: 87 fL (ref 79–97)
Monocytes Absolute: 0.7 10*3/uL (ref 0.1–0.9)
Monocytes: 5 %
Neutrophils Absolute: 8.2 10*3/uL — ABNORMAL HIGH (ref 1.4–7.0)
Neutrophils: 60 %
Platelets: 461 10*3/uL — ABNORMAL HIGH (ref 150–450)
RBC: 4.15 x10E6/uL (ref 3.77–5.28)
RDW: 13.6 % (ref 11.7–15.4)
WBC: 13.5 10*3/uL — ABNORMAL HIGH (ref 3.4–10.8)

## 2020-06-04 LAB — CMP14+EGFR
ALT: 10 IU/L (ref 0–32)
AST: 12 IU/L (ref 0–40)
Albumin/Globulin Ratio: 1.2 (ref 1.2–2.2)
Albumin: 4 g/dL (ref 3.8–4.8)
Alkaline Phosphatase: 78 IU/L (ref 44–121)
BUN/Creatinine Ratio: 13 (ref 9–23)
BUN: 14 mg/dL (ref 6–20)
Bilirubin Total: 0.2 mg/dL (ref 0.0–1.2)
CO2: 21 mmol/L (ref 20–29)
Calcium: 10.1 mg/dL (ref 8.7–10.2)
Chloride: 99 mmol/L (ref 96–106)
Creatinine, Ser: 1.06 mg/dL — ABNORMAL HIGH (ref 0.57–1.00)
Globulin, Total: 3.4 g/dL (ref 1.5–4.5)
Glucose: 128 mg/dL — ABNORMAL HIGH (ref 65–99)
Potassium: 4.9 mmol/L (ref 3.5–5.2)
Sodium: 135 mmol/L (ref 134–144)
Total Protein: 7.4 g/dL (ref 6.0–8.5)
eGFR: 69 mL/min/{1.73_m2} (ref >59)

## 2020-06-04 LAB — LIPID PANEL
Chol/HDL Ratio: 3.7 ratio (ref 0.0–4.4)
Cholesterol, Total: 168 mg/dL (ref 100–199)
HDL: 46 mg/dL (ref >39)
LDL Chol Calc (NIH): 82 mg/dL (ref 0–99)
Triglycerides: 240 mg/dL — ABNORMAL HIGH (ref 0–149)
VLDL Cholesterol Cal: 40 mg/dL (ref 5–40)

## 2020-06-05 ENCOUNTER — Inpatient Hospital Stay (HOSPITAL_COMMUNITY)
Admission: AD | Admit: 2020-06-05 | Discharge: 2020-06-07 | DRG: 832 | Disposition: A | Discharge status: Home / Self Care | Payer: Self-pay | Attending: Obstetrics and Gynecology | Admitting: Obstetrics and Gynecology

## 2020-06-05 ENCOUNTER — Inpatient Hospital Stay (HOSPITAL_COMMUNITY): Payer: Self-pay

## 2020-06-05 ENCOUNTER — Other Ambulatory Visit: Payer: Self-pay

## 2020-06-05 ENCOUNTER — Encounter (HOSPITAL_COMMUNITY): Payer: Self-pay | Admitting: Obstetrics and Gynecology

## 2020-06-05 DIAGNOSIS — Z3A09 9 weeks gestation of pregnancy: Secondary | ICD-10-CM

## 2020-06-05 DIAGNOSIS — O99281 Endocrine, nutritional and metabolic diseases complicating pregnancy, first trimester: Secondary | ICD-10-CM | POA: Diagnosis present

## 2020-06-05 DIAGNOSIS — E119 Type 2 diabetes mellitus without complications: Secondary | ICD-10-CM | POA: Diagnosis present

## 2020-06-05 DIAGNOSIS — O24111 Pre-existing diabetes mellitus, type 2, in pregnancy, first trimester: Secondary | ICD-10-CM | POA: Diagnosis present

## 2020-06-05 DIAGNOSIS — O2301 Infections of kidney in pregnancy, first trimester: Principal | ICD-10-CM | POA: Diagnosis present

## 2020-06-05 DIAGNOSIS — O26899 Other specified pregnancy related conditions, unspecified trimester: Secondary | ICD-10-CM

## 2020-06-05 DIAGNOSIS — R109 Unspecified abdominal pain: Principal | ICD-10-CM

## 2020-06-05 DIAGNOSIS — N200 Calculus of kidney: Secondary | ICD-10-CM | POA: Diagnosis present

## 2020-06-05 DIAGNOSIS — Z794 Long term (current) use of insulin: Secondary | ICD-10-CM

## 2020-06-05 DIAGNOSIS — Z20822 Contact with and (suspected) exposure to covid-19: Secondary | ICD-10-CM | POA: Diagnosis present

## 2020-06-05 DIAGNOSIS — E039 Hypothyroidism, unspecified: Secondary | ICD-10-CM | POA: Diagnosis present

## 2020-06-05 DIAGNOSIS — O99891 Other specified diseases and conditions complicating pregnancy: Secondary | ICD-10-CM

## 2020-06-05 DIAGNOSIS — O26831 Pregnancy related renal disease, first trimester: Secondary | ICD-10-CM | POA: Diagnosis present

## 2020-06-05 HISTORY — DX: Thyrotoxicosis, unspecified without thyrotoxic crisis or storm: E05.90

## 2020-06-05 LAB — URINALYSIS, ROUTINE W REFLEX MICROSCOPIC
Bilirubin Urine: NEGATIVE
Glucose, UA: >=500 mg/dL — AB
Hgb urine dipstick: NEGATIVE
Ketones, ur: NEGATIVE mg/dL
Nitrite: NEGATIVE
Protein, ur: NEGATIVE mg/dL
Specific Gravity, Urine: 1.007 (ref 1.005–1.030)
pH: 7 (ref 5.0–8.0)

## 2020-06-05 LAB — CBC
HCT: 36.2 % (ref 36.0–46.0)
Hemoglobin: 11.8 g/dL — ABNORMAL LOW (ref 12.0–15.0)
MCH: 28.8 pg (ref 26.0–34.0)
MCHC: 32.6 g/dL (ref 30.0–36.0)
MCV: 88.3 fL (ref 80.0–100.0)
Platelets: 382 10*3/uL (ref 150–400)
RBC: 4.1 MIL/uL (ref 3.87–5.11)
RDW: 13.2 % (ref 11.5–15.5)
WBC: 14.3 10*3/uL — ABNORMAL HIGH (ref 4.0–10.5)
nRBC: 0 % (ref 0.0–0.2)

## 2020-06-05 LAB — OB RESULTS CONSOLE GC/CHLAMYDIA: Gonorrhea: NEGATIVE

## 2020-06-05 LAB — POCT PREGNANCY, URINE: Preg Test, Ur: POSITIVE — AB

## 2020-06-05 MED ORDER — HYDROMORPHONE HCL 1 MG/ML IJ SOLN
0.5000 mg | Freq: Once | INTRAMUSCULAR | Status: AC
  Administered 2020-06-05: 0.5 mg via INTRAVENOUS
  Filled 2020-06-05: qty 1

## 2020-06-05 MED ORDER — LACTATED RINGERS IV BOLUS
1000.0000 mL | Freq: Once | INTRAVENOUS | Status: AC
  Administered 2020-06-05: 1000 mL via INTRAVENOUS

## 2020-06-05 MED ORDER — ONDANSETRON HCL 4 MG/2ML IJ SOLN
4.0000 mg | Freq: Once | INTRAMUSCULAR | Status: AC
  Administered 2020-06-05: 4 mg via INTRAVENOUS
  Filled 2020-06-05: qty 2

## 2020-06-05 NOTE — MAU Note (Signed)
Pt denies any fights, falls, MVA or abdominal trauma. Reports pain medication-pain score at 5 from 10 on 0-10 scale

## 2020-06-05 NOTE — MAU Note (Signed)
Bedside ultrasound completed.

## 2020-06-05 NOTE — MAU Note (Signed)
Pt reports pain in abd and lower back x one hour, denies bleeding

## 2020-06-05 NOTE — MAU Note (Signed)
Pt denies pain or pressure with urination. Denies blood or sediment noted with urination or on toilet tissue.

## 2020-06-06 ENCOUNTER — Inpatient Hospital Stay (HOSPITAL_COMMUNITY): Payer: Self-pay

## 2020-06-06 DIAGNOSIS — O26831 Pregnancy related renal disease, first trimester: Secondary | ICD-10-CM

## 2020-06-06 DIAGNOSIS — Z794 Long term (current) use of insulin: Secondary | ICD-10-CM

## 2020-06-06 DIAGNOSIS — N200 Calculus of kidney: Secondary | ICD-10-CM

## 2020-06-06 DIAGNOSIS — N12 Tubulo-interstitial nephritis, not specified as acute or chronic: Secondary | ICD-10-CM

## 2020-06-06 DIAGNOSIS — Z3A09 9 weeks gestation of pregnancy: Secondary | ICD-10-CM

## 2020-06-06 DIAGNOSIS — O2301 Infections of kidney in pregnancy, first trimester: Principal | ICD-10-CM

## 2020-06-06 DIAGNOSIS — E119 Type 2 diabetes mellitus without complications: Secondary | ICD-10-CM

## 2020-06-06 DIAGNOSIS — O24111 Pre-existing diabetes mellitus, type 2, in pregnancy, first trimester: Secondary | ICD-10-CM

## 2020-06-06 HISTORY — DX: Pregnancy related renal disease, first trimester: O26.831

## 2020-06-06 HISTORY — DX: Calculus of kidney: N20.0

## 2020-06-06 LAB — COMPREHENSIVE METABOLIC PANEL
ALT: 13 U/L (ref 0–44)
AST: 13 U/L — ABNORMAL LOW (ref 15–41)
Albumin: 3.3 g/dL — ABNORMAL LOW (ref 3.5–5.0)
Alkaline Phosphatase: 73 U/L (ref 38–126)
Anion gap: 8 (ref 5–15)
BUN: 26 mg/dL — ABNORMAL HIGH (ref 6–20)
CO2: 22 mmol/L (ref 22–32)
Calcium: 9.2 mg/dL (ref 8.9–10.3)
Chloride: 104 mmol/L (ref 98–111)
Creatinine, Ser: 1.2 mg/dL — ABNORMAL HIGH (ref 0.44–1.00)
GFR, Estimated: 59 mL/min — ABNORMAL LOW (ref >60)
Glucose, Bld: 243 mg/dL — ABNORMAL HIGH (ref 70–99)
Potassium: 3.7 mmol/L (ref 3.5–5.1)
Sodium: 134 mmol/L — ABNORMAL LOW (ref 135–145)
Total Bilirubin: 0.6 mg/dL (ref 0.3–1.2)
Total Protein: 7.6 g/dL (ref 6.5–8.1)

## 2020-06-06 LAB — TYPE AND SCREEN
ABO/RH(D): O POS
Antibody Screen: NEGATIVE

## 2020-06-06 LAB — HCG, QUANTITATIVE, PREGNANCY: hCG, Beta Chain, Quant, S: 121988 m[IU]/mL — ABNORMAL HIGH (ref <5)

## 2020-06-06 LAB — GLUCOSE, CAPILLARY
Glucose-Capillary: 197 mg/dL — ABNORMAL HIGH (ref 70–99)
Glucose-Capillary: 210 mg/dL — ABNORMAL HIGH (ref 70–99)
Glucose-Capillary: 177 mg/dL — ABNORMAL HIGH (ref 70–99)
Glucose-Capillary: 220 mg/dL — ABNORMAL HIGH (ref 70–99)
Glucose-Capillary: 223 mg/dL — ABNORMAL HIGH (ref 70–99)

## 2020-06-06 LAB — WET PREP, GENITAL
Clue Cells Wet Prep HPF POC: NONE SEEN
Sperm: NONE SEEN
Trich, Wet Prep: NONE SEEN

## 2020-06-06 LAB — RESP PANEL BY RT-PCR (FLU A&B, COVID) ARPGX2
Influenza A by PCR: NEGATIVE
Influenza B by PCR: NEGATIVE
SARS Coronavirus 2 by RT PCR: NEGATIVE

## 2020-06-06 LAB — ABO/RH: ABO/RH(D): O POS

## 2020-06-06 LAB — GC/CHLAMYDIA PROBE AMP (~~LOC~~) NOT AT ARMC
Chlamydia: NEGATIVE
Comment: NEGATIVE
Comment: NORMAL
Neisseria Gonorrhea: NEGATIVE

## 2020-06-06 MED ORDER — CALCIUM CARBONATE ANTACID 500 MG PO CHEW: 2.0000 | CHEWABLE_TABLET | ORAL | Status: DC | PRN

## 2020-06-06 MED ORDER — INSULIN DETEMIR 100 UNIT/ML ~~LOC~~ SOLN
60.0000 [IU] | Freq: Every day | SUBCUTANEOUS | Status: DC
  Administered 2020-06-06: 60 [IU] via SUBCUTANEOUS
  Filled 2020-06-06 (×2): qty 0.6

## 2020-06-06 MED ORDER — PRENATAL MULTIVITAMIN CH
1.0000 | ORAL_TABLET | Freq: Every day | ORAL | Status: DC
  Administered 2020-06-06 – 2020-06-07 (×2): 1 via ORAL
  Filled 2020-06-06 (×2): qty 1

## 2020-06-06 MED ORDER — HYDROMORPHONE HCL 1 MG/ML IJ SOLN
0.5000 mg | Freq: Once | INTRAMUSCULAR | Status: AC
  Administered 2020-06-06: 0.5 mg via INTRAVENOUS
  Filled 2020-06-06: qty 1

## 2020-06-06 MED ORDER — INSULIN DETEMIR 100 UNIT/ML ~~LOC~~ SOLN
10.0000 [IU] | Freq: Every day | SUBCUTANEOUS | Status: DC
  Administered 2020-06-06 – 2020-06-07 (×2): 10 [IU] via SUBCUTANEOUS
  Filled 2020-06-06 (×2): qty 0.1

## 2020-06-06 MED ORDER — SODIUM CHLORIDE 0.9 % IV SOLN: 1.0000 g | Freq: Two times a day (BID) | INTRAVENOUS | Status: DC

## 2020-06-06 MED ORDER — INSULIN ASPART 100 UNIT/ML IJ SOLN
4.0000 [IU] | Freq: Three times a day (TID) | INTRAMUSCULAR | Status: DC
  Administered 2020-06-06: 4 [IU] via SUBCUTANEOUS

## 2020-06-06 MED ORDER — LEVOTHYROXINE SODIUM 150 MCG PO TABS
150.0000 ug | ORAL_TABLET | Freq: Every day | ORAL | Status: DC
  Administered 2020-06-06 – 2020-06-07 (×2): 150 ug via ORAL
  Filled 2020-06-06 (×2): qty 1

## 2020-06-06 MED ORDER — DOCUSATE SODIUM 100 MG PO CAPS
100.0000 mg | ORAL_CAPSULE | Freq: Every day | ORAL | Status: DC
  Administered 2020-06-07: 100 mg via ORAL
  Filled 2020-06-06 (×2): qty 1

## 2020-06-06 MED ORDER — INSULIN ASPART 100 UNIT/ML IJ SOLN
0.0000 [IU] | Freq: Three times a day (TID) | INTRAMUSCULAR | Status: DC
  Administered 2020-06-06 (×2): 4 [IU] via SUBCUTANEOUS
  Administered 2020-06-07: 1 [IU] via SUBCUTANEOUS

## 2020-06-06 MED ORDER — ACETAMINOPHEN 325 MG PO TABS: 650.0000 mg | ORAL_TABLET | Freq: Four times a day (QID) | ORAL | Status: DC | PRN

## 2020-06-06 MED ORDER — SODIUM CHLORIDE 0.9 % IV SOLN
2.0000 g | INTRAVENOUS | Status: DC
  Administered 2020-06-06 – 2020-06-07 (×2): 2 g via INTRAVENOUS
  Filled 2020-06-06: qty 2
  Filled 2020-06-06 (×2): qty 20

## 2020-06-06 MED ORDER — HYDROMORPHONE HCL 1 MG/ML IJ SOLN: 1.0000 mg | INTRAMUSCULAR | Status: DC | PRN

## 2020-06-06 MED ORDER — ACETAMINOPHEN 325 MG PO TABS: 650.0000 mg | ORAL_TABLET | ORAL | Status: DC | PRN

## 2020-06-06 MED ORDER — INSULIN GLARGINE 100 UNIT/ML ~~LOC~~ SOLN
60.0000 [IU] | Freq: Once | SUBCUTANEOUS | Status: AC
  Administered 2020-06-06: 60 [IU] via SUBCUTANEOUS
  Filled 2020-06-06: qty 0.6

## 2020-06-06 MED ORDER — ONDANSETRON HCL 4 MG/2ML IJ SOLN
4.0000 mg | Freq: Once | INTRAMUSCULAR | Status: AC
  Administered 2020-06-06: 4 mg via INTRAVENOUS
  Filled 2020-06-06: qty 2

## 2020-06-06 NOTE — Progress Notes (Signed)
2 hrs PP CBG 177

## 2020-06-06 NOTE — Progress Notes (Signed)
[place]) NOTE  Audrey Mcmillan is a 40 y.o. G2P1001 with Estimated Date of Delivery: 01/06/21   By  LMP [redacted]w[redacted]d  who is admitted for right flank pain- concern for pyelonephritis vs nephrolithiasis.    Length of Stay:  0  Days  Date of admission:06/05/2020  Subjective: Reports that her pain is much better today, in fact it has completely resolved.  Previously noted considerable right flank pain radiating along her side and down to mid-pubic area.  Nausea/vomiting also improved though she has not yet had breakfast this am. Denies vaginal bleeding, discharge or LOF.  Overall doing better today and reports no acute complaints  Vitals:  Blood pressure (!) 119/58, pulse 81, temperature 98.1 F (36.7 C), temperature source Oral, resp. rate 18, weight 68.9 kg, last menstrual period 02/11/2020, SpO2 99 %. Vitals:   06/06/20 0200 06/06/20 0205 06/06/20 0452 06/06/20 0811  BP:   135/77 (!) 119/58  Pulse:   90 81  Resp:   16 18  Temp:   98 F (36.7 C) 98.1 F (36.7 C)  TempSrc:   Oral Oral  SpO2: 97% 94%  99%  Weight:   68.9 kg    Physical Examination:  General appearance - alert, well appearing, and in no distress Mental status - normal mood and behavior Chest - clear to auscultation, no wheezes, rales or rhonchi, symmetric air entry Heart - normal rate and regular rhythm Abdomen - soft, nontender, nondistended, no reproducible pain Back exam - no CVA tenderness Extremities - no edema noted, no calf tenderness bilaterally Skin - warm and dry  Fetal Monitoring: doppler daily  Labs:  Results for orders placed or performed during the hospital encounter of 06/05/20 (from the past 24 hour(s))  Urinalysis, Routine w reflex microscopic   Collection Time: 06/05/20 10:49 PM  Result Value Ref Range   Color, Urine STRAW (A) YELLOW   APPearance CLEAR CLEAR   Specific Gravity, Urine 1.007 1.005 - 1.030   pH 7.0 5.0 - 8.0   Glucose, UA >=500 (A) NEGATIVE mg/dL    Hgb urine dipstick NEGATIVE NEGATIVE   Bilirubin Urine NEGATIVE NEGATIVE   Ketones, ur NEGATIVE NEGATIVE mg/dL   Protein, ur NEGATIVE NEGATIVE mg/dL   Nitrite NEGATIVE NEGATIVE   Leukocytes,Ua LARGE (A) NEGATIVE   RBC / HPF 0-5 0 - 5 RBC/hpf   WBC, UA 21-50 0 - 5 WBC/hpf   Bacteria, UA FEW (A) NONE SEEN   Squamous Epithelial / LPF 0-5 0 - 5  Pregnancy, urine POC   Collection Time: 06/05/20 10:50 PM  Result Value Ref Range   Preg Test, Ur POSITIVE (A) NEGATIVE  CBC   Collection Time: 06/05/20 11:05 PM  Result Value Ref Range   WBC 14.3 (H) 4.0 - 10.5 K/uL   RBC 4.10 3.87 - 5.11 MIL/uL   Hemoglobin 11.8 (L) 12.0 - 15.0 g/dL   HCT 36.2 36.0 - 46.0 %   MCV 88.3 80.0 - 100.0 fL   MCH 28.8 26.0 - 34.0 pg   MCHC 32.6 30.0 - 36.0 g/dL   RDW 13.2 11.5 - 15.5 %   Platelets 382 150 - 400 K/uL   nRBC 0.0 0.0 - 0.2 %  hCG, quantitative, pregnancy   Collection Time: 06/05/20 11:05 PM  Result Value Ref Range   hCG, Beta Chain, Quant, S 121,988 (H) <5 mIU/mL  ABO/Rh   Collection Time: 06/05/20 11:05 PM  Result Value Ref Range   ABO/RH(D) O POS   Type and screen  [place]   Collection Time: 06/05/20 11:05 PM  Result Value Ref Range   ABO/RH(D) O POS    Antibody Screen NEG    Sample Expiration      06/09/2020,2359 Performed at [facility], [place] [address], [place], [place] [address]   Comprehensive metabolic panel   Collection Time: 06/05/20 11:37 PM  Result Value Ref Range   Sodium 134 (L) 135 - 145 mmol/L   Potassium 3.7 3.5 - 5.1 mmol/L   Chloride 104 98 - 111 mmol/L   CO2 22 22 - 32 mmol/L   Glucose, Bld 243 (H) 70 - 99 mg/dL   BUN 26 (H) 6 - 20 mg/dL   Creatinine, Ser 1.20 (H) 0.44 - 1.00 mg/dL   Calcium 9.2 8.9 - 10.3 mg/dL   Total Protein 7.6 6.5 - 8.1 g/dL   Albumin 3.3 (L) 3.5 - 5.0 g/dL   AST 13 (L) 15 - 41 U/L   ALT 13 0 - 44 U/L   Alkaline Phosphatase 73 38 - 126 U/L   Total Bilirubin 0.6 0.3 - 1.2 mg/dL   GFR, Estimated 59 (L) >60  mL/min   Anion gap 8 5 - 15  Glucose, capillary   Collection Time: 06/06/20  1:41 AM  Result Value Ref Range   Glucose-Capillary 197 (H) 70 - 99 mg/dL  Wet prep, genital   Collection Time: 06/06/20  2:01 AM   Specimen: Vaginal  Result Value Ref Range   Yeast Wet Prep HPF POC PRESENT (A) NONE SEEN   Trich, Wet Prep NONE SEEN NONE SEEN   Clue Cells Wet Prep HPF POC NONE SEEN NONE SEEN   WBC, Wet Prep HPF POC MANY (A) NONE SEEN   Sperm NONE SEEN   Resp Panel by RT-PCR (Flu A&B, Covid) Nasopharyngeal Swab   Collection Time: 06/06/20  4:26 AM   Specimen: Nasopharyngeal Swab; Nasopharyngeal(NP) swabs in vial transport medium  Result Value Ref Range   SARS Coronavirus 2 by RT PCR NEGATIVE NEGATIVE   Influenza A by PCR NEGATIVE NEGATIVE   Influenza B by PCR NEGATIVE NEGATIVE  Glucose, capillary   Collection Time: 06/06/20  5:16 AM  Result Value Ref Range   Glucose-Capillary 210 (H) 70 - 99 mg/dL    Imaging Studies:    US OB Comp Less 14 Wks  Result Date: 06/06/2020 CLINICAL DATA:  Initial evaluation for acute severe right lower quadrant pain, early pregnancy. EXAM: OBSTETRIC <14 WK ULTRASOUND TECHNIQUE: Transabdominal ultrasound was performed for evaluation of the gestation as well as the maternal uterus and adnexal regions. COMPARISON:  None. FINDINGS: Intrauterine gestational sac: Single Yolk sac:  Present Embryo:  Present Cardiac Activity: Present Heart Rate: 182 bpm CRL: 26.1 mm   9 w 3 d                  [place] EDC: 01/05/2021 Subchorionic hemorrhage:  None visualized. Maternal uterus/adnexae: Ovaries within normal limits. No adnexal mass or free fluid. IMPRESSION: 1. Single viable intrauterine pregnancy as above, estimated gestational age [redacted] weeks and 3 days by crown-rump length, with ultrasound EDC of 01/05/2021. No complication. 2. No other acute maternal uterine or adnexal abnormality. Electronically Signed   By: Jeannine Boga M.D.   On: 06/06/2020 01:37   US Renal  Result Date:  06/06/2020 CLINICAL DATA:  Initial evaluation for acute right lower quadrant pain radiating to back. EXAM: RENAL / URINARY TRACT ULTRASOUND COMPLETE COMPARISON:  Prior ultrasound from 02/05/2020 FINDINGS: Right Kidney:  Renal measurements: 13.8 x 7.5 x 6.5 cm = volume: 349.3 mL. Increased echogenicity seen within the renal parenchyma. Mild to moderate right-sided hydronephrosis. The visualized proximal right ureter is dilated. No visible echogenic calculi. No focal renal mass. Small amount of free perinephric fluid seen about the right kidney. Left Kidney: Renal measurements: 14.5 x 7.1 x 6.3 cm = volume: 340.3 mL. Mildly increased echogenicity within the renal parenchyma. No nephrolithiasis or hydronephrosis. No focal renal mass. Bladder: Appears normal for degree of bladder distention. A right jet was not visualized at the bladder. Other: None. IMPRESSION: 1. Mild to moderate right-sided hydronephrosis with trace free perinephric fluid. A right jet was not visualized at the bladder. 2. Mildly increased echogenicity within the renal parenchyma, suggesting medical renal disease. 3. Bilateral nephromegaly. Electronically Signed   By: Jeannine Boga M.D.   On: 06/06/2020 04:10     ASSESSMENT/PLAN: G2P1001 [redacted]w[redacted]d Estimated Date of Delivery: 01/06/21 admitted due to flank pain concern for pyelonephritis vs nephrolithiasis with AKI  1) Pyelo vs Nephroliathisis, AKI -currently on IV Rocephin -pt clinically improving, afebrile -plan for repeat labs in am- CBC, CMP -discussed with urology- advised to continue with management and plan for outpatient repeat US in 1-2wks -pain controlled with IV Dilaudid- will transition to oral tylenol today  2) Type 2 DM -Diabetic coordinator consulted -currently on Lantus 60units qhs and Levemir 10u daily  3) Hypothyroidism -on Levothyroxine 152mcg daily  DISPO: Continue management as outlined above, plan for IV antibiotics for ~24-48hrs  Janyth Pupa,  DO Attending [place], [place] for [employer], [place]

## 2020-06-06 NOTE — Progress Notes (Signed)
Inpatient Diabetes Program Recommendations  AACE/ADA: New Consensus Statement on Inpatient Glycemic Control (2015)  Target Ranges:  Prepandial:   less than 140 mg/dL      Peak postprandial:   less than 180 mg/dL (1-2 hours)      Critically ill patients:  140 - 180 mg/dL   Lab Results  Component Value Date   GLUCAP 177 (H) 06/06/2020   HGBA1C 10.4 (A) 06/03/2020    Review of Glycemic Control Results for Audrey Mcmillan, Audrey Mcmillan (MRN [id number]) as of 06/06/2020 12:08  Ref. Range 06/06/2020 01:41 06/06/2020 05:16 06/06/2020 11:06  Glucose-Capillary Latest Ref Range: 70 - 99 mg/dL 197 (H)  Lantus 60 units given at 0149 210 (H) 177 (H)  Levemir 10 units given at 1021   Diabetes history: DM type 2  Outpatient Diabetes medications: Lantus 30 units qam, 60 units qpm Current orders for Inpatient glycemic control:  Levemir 10 units Daily  Inpatient Diabetes Program Recommendations:    -  CBGs fasting and 2 hour postprandial  -  Novolog 0-14 units tid postprandial -  Novolog 4 units tid meal coverage -  Add Levemir 60 units qpm (pt received 60 units last night)  Will watch trends on this regimen.  Spoke with pt at bedside regarding glycemic  Control in pregnancy and diet. Pt reports only being on Lantus bid and synthroid prior to pregnancy. Pt reports her kidney doctor took her off of metformin and glipizide. Pt was place only on Levemir 10 units daily last week at her last PCP visit.  Thanks,  Tama Headings RN, MSN, BC-ADM Inpatient Diabetes Coordinator Team Pager [phone] (8a-5p)

## 2020-06-06 NOTE — Progress Notes (Signed)
Unable to obtain doppler. Notified Dr. Nelda Marseille. En route to department to perform bedside ultrasound.

## 2020-06-06 NOTE — H&P (Addendum)
History     CSN: [id number]  Arrival date and time: 06/05/20 2221   Event Date/Time   First Provider Initiated Contact with Patient 06/05/20 2256      Chief Complaint  Patient presents with  . Back Pain  . Abdominal Pain   HPI  Audrey Mcmillan is a 40 y.o. G2P1001 at 60w3dwho presents with severe abdominal and back pain. Upon arrival to MAU, patient is writhing in wheelchair and vomiting. She states she had a positive pregnancy test a few weeks ago and had a sudden onset of pain at 2100. She states the pain is in her abdomen and wraps around to her back. She reports it comes and goes and rates it a 10/10. She also reports nausea and vomiting that is worse when the pain comes. She is unable to lie down in the bed and is pacing at the bedside while vomiting. She denies any vaginal bleeding or discharge. She has not established care anywhere for this pregnancy. She reports a hx of insulin dependent diabetes and has not taken her insulin tonight.   OB History    Gravida  2   Para  1   Term  1   Preterm  0   AB  0   Living  1     SAB  0   IAB  0   Ectopic  0   Multiple  0   Live Births  1           Past Medical History:  Diagnosis Date  . Diabetes mellitus without complication (HCombine   . High cholesterol     No past surgical history on file.  No family history on file.  Social History   Tobacco Use  . Smoking status: Never Smoker  . Smokeless tobacco: Never Used  Vaping Use  . Vaping Use: Never used  Substance Use Topics  . Alcohol use: No    Comment: occ  . Drug use: No    Allergies: No Known Allergies  Medications Prior to Admission  Medication Sig Dispense Refill Last Dose  . insulin glargine (LANTUS) 100 UNIT/ML injection Inject 30 Units into the skin in the morning.     . insulin glargine (LANTUS) 100 UNIT/ML injection Inject 60 Units into the skin at bedtime.     .Marland Kitchenatorvastatin (LIPITOR) 40 MG tablet Take 40 mg by mouth 2 (two)  times daily.     .Marland Kitchenazithromycin (ZITHROMAX) 500 MG tablet TAKE 1 TABLET (500 MG TOTAL) BY MOUTH ONCE FOR 1 DOSE. 1 tablet 0   . Blood Glucose Monitoring Suppl (TRUE METRIX METER) w/Device KIT Use as instructed 1 kit 0   . cefdinir (OMNICEF) 300 MG capsule TAKE 1 CAPSULE (300 MG TOTAL) BY MOUTH 2 (TWO) TIMES DAILY FOR 2 DAYS. 4 capsule 0   . ferrous sulfate 325 (65 FE) MG tablet TAKE 1 TABLET (325 MG TOTAL) BY MOUTH DAILY WITH BREAKFAST. 30 tablet 1   . glucose blood (TRUE METRIX BLOOD GLUCOSE TEST) test strip Use as instructed. Check blood glucose levels twice per day. 100 each 12   . insulin detemir (LEVEMIR) 100 UNIT/ML injection Inject 0.1 mLs (10 Units total) into the skin daily. 10 mL 2   . Insulin Syringe-Needle U-100 (INSULIN SYRINGE 1CC/30GX5/16") 30G X 5/16" 1 ML MISC Inject 1 application into the skin 2 times daily at 12 noon and 4 pm. 100 each 5   . levothyroxine (SYNTHROID) 150 MCG tablet TAKE 1  TABLET (150 MCG TOTAL) BY MOUTH DAILY BEFORE BREAKFAST. 30 tablet 1   . TRUEplus Lancets 28G MISC Use as instructed. Check blood glucose levels twice per day. 100 each 3     Review of Systems  Constitutional: Negative.  Negative for fatigue and fever.  HENT: Negative.   Respiratory: Negative.  Negative for shortness of breath.   Cardiovascular: Negative.  Negative for chest pain.  Gastrointestinal: Positive for abdominal pain, nausea and vomiting. Negative for constipation and diarrhea.  Genitourinary: Negative.  Negative for dysuria, vaginal bleeding and vaginal discharge.  Musculoskeletal: Positive for back pain.  Neurological: Negative.  Negative for dizziness and headaches.   Physical Exam   Blood pressure (!) 124/44, pulse 79, temperature 98.4 F (36.9 C), temperature source Oral, resp. rate (!) 21, last menstrual period 02/11/2020, SpO2 99 %.  Patient Vitals for the past 24 hrs:  BP Temp Temp src Pulse Resp SpO2  06/06/20 0205 -- -- -- -- -- 94 %  06/06/20 0200 -- -- -- -- --  97 %  06/06/20 0157 (!) 141/73 (!) 97.4 F (36.3 C) Axillary 73 18 --  06/05/20 2254 136/64 -- -- 78 20 --  06/05/20 2239 (!) 124/44 98.4 F (36.9 C) Oral 79 (!) 21 99 %     Physical Exam Vitals and nursing note reviewed.  Constitutional:      General: She is not in acute distress.    Appearance: She is well-developed.  HENT:     Head: Normocephalic.  Eyes:     Pupils: Pupils are equal, round, and reactive to light.  Cardiovascular:     Rate and Rhythm: Normal rate and regular rhythm.     Heart sounds: Normal heart sounds.  Pulmonary:     Effort: Pulmonary effort is normal. No respiratory distress.     Breath sounds: Normal breath sounds.  Abdominal:     General: Bowel sounds are normal. There is no distension.     Palpations: Abdomen is soft.     Tenderness: There is no abdominal tenderness. There is right CVA tenderness and guarding.  Skin:    General: Skin is warm and dry.  Neurological:     Mental Status: She is alert and oriented to person, place, and time.  Psychiatric:        Behavior: Behavior normal.        Thought Content: Thought content normal.        Judgment: Judgment normal.     MAU Course  Procedures Results for orders placed or performed during the hospital encounter of 06/05/20 (from the past 24 hour(s))  Urinalysis, Routine w reflex microscopic     Status: Abnormal   Collection Time: 06/05/20 10:49 PM  Result Value Ref Range   Color, Urine STRAW (A) YELLOW   APPearance CLEAR CLEAR   Specific Gravity, Urine 1.007 1.005 - 1.030   pH 7.0 5.0 - 8.0   Glucose, UA >=500 (A) NEGATIVE mg/dL   Hgb urine dipstick NEGATIVE NEGATIVE   Bilirubin Urine NEGATIVE NEGATIVE   Ketones, ur NEGATIVE NEGATIVE mg/dL   Protein, ur NEGATIVE NEGATIVE mg/dL   Nitrite NEGATIVE NEGATIVE   Leukocytes,Ua LARGE (A) NEGATIVE   RBC / HPF 0-5 0 - 5 RBC/hpf   WBC, UA 21-50 0 - 5 WBC/hpf   Bacteria, UA FEW (A) NONE SEEN   Squamous Epithelial / LPF 0-5 0 - 5  Pregnancy, urine  POC     Status: Abnormal   Collection Time: 06/05/20 10:50 PM  Result Value Ref Range   Preg Test, Ur POSITIVE (A) NEGATIVE  CBC     Status: Abnormal   Collection Time: 06/05/20 11:05 PM  Result Value Ref Range   WBC 14.3 (H) 4.0 - 10.5 K/uL   RBC 4.10 3.87 - 5.11 MIL/uL   Hemoglobin 11.8 (L) 12.0 - 15.0 g/dL   HCT 36.2 36.0 - 46.0 %   MCV 88.3 80.0 - 100.0 fL   MCH 28.8 26.0 - 34.0 pg   MCHC 32.6 30.0 - 36.0 g/dL   RDW 13.2 11.5 - 15.5 %   Platelets 382 150 - 400 K/uL   nRBC 0.0 0.0 - 0.2 %  hCG, quantitative, pregnancy     Status: Abnormal   Collection Time: 06/05/20 11:05 PM  Result Value Ref Range   hCG, Beta Chain, Quant, S 121,988 (H) <5 mIU/mL  ABO/Rh     Status: None   Collection Time: 06/05/20 11:05 PM  Result Value Ref Range   ABO/RH(D)      O POS Performed at [facility] [address], [place], [place] [address]   Comprehensive metabolic panel     Status: Abnormal   Collection Time: 06/05/20 11:37 PM  Result Value Ref Range   Sodium 134 (L) 135 - 145 mmol/L   Potassium 3.7 3.5 - 5.1 mmol/L   Chloride 104 98 - 111 mmol/L   CO2 22 22 - 32 mmol/L   Glucose, Bld 243 (H) 70 - 99 mg/dL   BUN 26 (H) 6 - 20 mg/dL   Creatinine, Ser 1.20 (H) 0.44 - 1.00 mg/dL   Calcium 9.2 8.9 - 10.3 mg/dL   Total Protein 7.6 6.5 - 8.1 g/dL   Albumin 3.3 (L) 3.5 - 5.0 g/dL   AST 13 (L) 15 - 41 U/L   ALT 13 0 - 44 U/L   Alkaline Phosphatase 73 38 - 126 U/L   Total Bilirubin 0.6 0.3 - 1.2 mg/dL   GFR, Estimated 59 (L) >60 mL/min   Anion gap 8 5 - 15  Glucose, capillary     Status: Abnormal   Collection Time: 06/06/20  1:41 AM  Result Value Ref Range   Glucose-Capillary 197 (H) 70 - 99 mg/dL  Wet prep, genital     Status: Abnormal   Collection Time: 06/06/20  2:01 AM   Specimen: Vaginal  Result Value Ref Range   Yeast Wet Prep HPF POC PRESENT (A) NONE SEEN   Trich, Wet Prep NONE SEEN NONE SEEN   Clue Cells Wet Prep HPF POC NONE SEEN NONE SEEN   WBC, Wet Prep HPF POC  [place]Y (A) NONE SEEN   Sperm NONE SEEN    US OB Comp Less 14 Wks  Result Date: 06/06/2020 CLINICAL DATA:  Initial evaluation for acute severe right lower quadrant pain, early pregnancy. EXAM: OBSTETRIC <14 WK ULTRASOUND TECHNIQUE: Transabdominal ultrasound was performed for evaluation of the gestation as well as the maternal uterus and adnexal regions. COMPARISON:  None. FINDINGS: Intrauterine gestational sac: Single Yolk sac:  Present Embryo:  Present Cardiac Activity: Present Heart Rate: 182 bpm CRL: 26.1 mm   9 w 3 d                  [place] EDC: 01/05/2021 Subchorionic hemorrhage:  None visualized. Maternal uterus/adnexae: Ovaries within normal limits. No adnexal mass or free fluid. IMPRESSION: 1. Single viable intrauterine pregnancy as above, estimated gestational age [redacted] weeks and 3 days by crown-rump length, with ultrasound EDC  of 01/05/2021. No complication. 2. No other acute maternal uterine or adnexal abnormality. Electronically Signed   By: Jeannine Boga M.D.   On: 06/06/2020 01:37   US Renal  Result Date: 06/06/2020 CLINICAL DATA:  Initial evaluation for acute right lower quadrant pain radiating to back. EXAM: RENAL / URINARY TRACT ULTRASOUND COMPLETE COMPARISON:  Prior ultrasound from 02/05/2020 FINDINGS: Right Kidney: Renal measurements: 13.8 x 7.5 x 6.5 cm = volume: 349.3 mL. Increased echogenicity seen within the renal parenchyma. Mild to moderate right-sided hydronephrosis. The visualized proximal right ureter is dilated. No visible echogenic calculi. No focal renal mass. Small amount of free perinephric fluid seen about the right kidney. Left Kidney: Renal measurements: 14.5 x 7.1 x 6.3 cm = volume: 340.3 mL. Mildly increased echogenicity within the renal parenchyma. No nephrolithiasis or hydronephrosis. No focal renal mass. Bladder: Appears normal for degree of bladder distention. A right jet was not visualized at the bladder. Other: None. IMPRESSION: 1. Mild to moderate right-sided  hydronephrosis with trace free perinephric fluid. A right jet was not visualized at the bladder. 2. Mildly increased echogenicity within the renal parenchyma, suggesting medical renal disease. 3. Bilateral nephromegaly. Electronically Signed   By: Jeannine Boga M.D.   On: 06/06/2020 04:10   MDM UA, UPT CBC, HCG ABO/Rh- O Pos Wet prep and gc/chlamydia US OB Comp Less 14 weeks with Transvaginal  LR bolus Dilaudid IV- patient reports pain is now a 7/10 Zofran IV  US Renal Bedtime insulin given- 60 units Lantus  After ultrasound, patient reports pain has returned and is vomiting profusely again. Another dose of IV dialudid and Zofran given.   Upon reassessment, patient reports pain is improved as long as she is still. When she moves, she rates pain a 5/10. She reports improvement of nausea.   Consulted with Dr. Elly Modena regarding presentation and results- recommends admission to [facility] for pain management and consult with urology. Discussed plan of care with patient and patient verbalized understanding.   Assessment and Plan  -Renal calculi -Pyelonephritis  -Flank pain pregnancy  -Admit to [facility] for pain control and consult with urology in am -Care turned over to MD  [place] 06/05/2020, 10:56 PM

## 2020-06-06 NOTE — MAU Note (Signed)
Audrey Mcmillan [phone] interpreter. Lantus dose verification prior to administration and BS recheck. Vaginal swab collection procedure and purpose. Pt agreeable and states her pain is returning. CNM notified.

## 2020-06-07 ENCOUNTER — Other Ambulatory Visit (HOSPITAL_COMMUNITY): Payer: Self-pay

## 2020-06-07 LAB — COMPREHENSIVE METABOLIC PANEL
ALT: 12 U/L (ref 0–44)
AST: 11 U/L — ABNORMAL LOW (ref 15–41)
Albumin: 2.5 g/dL — ABNORMAL LOW (ref 3.5–5.0)
Alkaline Phosphatase: 51 U/L (ref 38–126)
Anion gap: 10 (ref 5–15)
BUN: 15 mg/dL (ref 6–20)
CO2: 23 mmol/L (ref 22–32)
Calcium: 9 mg/dL (ref 8.9–10.3)
Chloride: 103 mmol/L (ref 98–111)
Creatinine, Ser: 1.16 mg/dL — ABNORMAL HIGH (ref 0.44–1.00)
GFR, Estimated: >60 mL/min (ref >60)
Glucose, Bld: 81 mg/dL (ref 70–99)
Potassium: 3 mmol/L — ABNORMAL LOW (ref 3.5–5.1)
Sodium: 136 mmol/L (ref 135–145)
Total Bilirubin: 0.3 mg/dL (ref 0.3–1.2)
Total Protein: 6.3 g/dL — ABNORMAL LOW (ref 6.5–8.1)

## 2020-06-07 LAB — CBC WITH DIFFERENTIAL/PLATELET
Abs Immature Granulocytes: 0.22 10*3/uL — ABNORMAL HIGH (ref 0.00–0.07)
Basophils Absolute: 0.1 10*3/uL (ref 0.0–0.1)
Basophils Relative: 0 %
Eosinophils Absolute: 0.1 10*3/uL (ref 0.0–0.5)
Eosinophils Relative: 1 %
HCT: 29.1 % — ABNORMAL LOW (ref 36.0–46.0)
Hemoglobin: 9.5 g/dL — ABNORMAL LOW (ref 12.0–15.0)
Immature Granulocytes: 1 %
Lymphocytes Relative: 10 %
Lymphs Abs: 2 10*3/uL (ref 0.7–4.0)
MCH: 29.1 pg (ref 26.0–34.0)
MCHC: 32.6 g/dL (ref 30.0–36.0)
MCV: 89 fL (ref 80.0–100.0)
Monocytes Absolute: 1.1 10*3/uL — ABNORMAL HIGH (ref 0.1–1.0)
Monocytes Relative: 5 %
Neutro Abs: 17.2 10*3/uL — ABNORMAL HIGH (ref 1.7–7.7)
Neutrophils Relative %: 83 %
Platelets: 280 10*3/uL (ref 150–400)
RBC: 3.27 MIL/uL — ABNORMAL LOW (ref 3.87–5.11)
RDW: 14 % (ref 11.5–15.5)
WBC: 20.7 10*3/uL — ABNORMAL HIGH (ref 4.0–10.5)
nRBC: 0 % (ref 0.0–0.2)

## 2020-06-07 LAB — GLUCOSE, CAPILLARY
Glucose-Capillary: 55 mg/dL — ABNORMAL LOW (ref 70–99)
Glucose-Capillary: 75 mg/dL (ref 70–99)
Glucose-Capillary: 108 mg/dL — ABNORMAL HIGH (ref 70–99)

## 2020-06-07 MED ORDER — INSULIN ASPART 100 UNIT/ML IJ SOLN
8.0000 [IU] | Freq: Three times a day (TID) | INTRAMUSCULAR | 11 refills | Status: DC
  Filled 2020-06-07 – 2020-06-10 (×2): qty 10, 42d supply, fill #0

## 2020-06-07 MED ORDER — POTASSIUM CHLORIDE CRYS ER 20 MEQ PO TBCR: 40.0000 meq | EXTENDED_RELEASE_TABLET | Freq: Every day | ORAL | Status: DC

## 2020-06-07 MED ORDER — INSULIN DETEMIR 100 UNIT/ML ~~LOC~~ SOLN
50.0000 [IU] | Freq: Every day | SUBCUTANEOUS | 11 refills | Status: DC
  Filled 2020-06-07: qty 10, 20d supply, fill #0

## 2020-06-07 MED ORDER — POTASSIUM CHLORIDE CRYS ER 20 MEQ PO TBCR
40.0000 meq | EXTENDED_RELEASE_TABLET | Freq: Two times a day (BID) | ORAL | 0 refills | Status: DC
  Filled 2020-06-07 – 2020-06-10 (×3): qty 8, 2d supply, fill #0

## 2020-06-07 MED ORDER — INSULIN DETEMIR 100 UNIT/ML ~~LOC~~ SOLN
50.0000 [IU] | Freq: Every day | SUBCUTANEOUS | Status: DC
  Filled 2020-06-07: qty 0.5

## 2020-06-07 MED ORDER — CEPHALEXIN 500 MG PO CAPS
500.0000 mg | ORAL_CAPSULE | Freq: Two times a day (BID) | ORAL | 0 refills | Status: AC
  Filled 2020-06-07: qty 28, 14d supply, fill #0

## 2020-06-07 MED ORDER — INSULIN ASPART 100 UNIT/ML FLEXPEN
8.0000 [IU] | PEN_INJECTOR | Freq: Three times a day (TID) | SUBCUTANEOUS | 11 refills | Status: DC
  Filled 2020-06-07: qty 9, 30d supply, fill #0

## 2020-06-07 MED ORDER — POTASSIUM CHLORIDE CRYS ER 20 MEQ PO TBCR
40.0000 meq | EXTENDED_RELEASE_TABLET | Freq: Two times a day (BID) | ORAL | Status: DC
  Administered 2020-06-07: 40 meq via ORAL
  Filled 2020-06-07: qty 2

## 2020-06-07 MED ORDER — POTASSIUM CHLORIDE CRYS ER 20 MEQ PO TBCR
40.0000 meq | EXTENDED_RELEASE_TABLET | Freq: Two times a day (BID) | ORAL | 0 refills | Status: DC
  Filled 2020-06-07 (×2): qty 20, 5d supply, fill #0

## 2020-06-07 MED ORDER — CEPHALEXIN 500 MG PO CAPS
500.0000 mg | ORAL_CAPSULE | Freq: Two times a day (BID) | ORAL | Status: DC
  Administered 2020-06-07: 500 mg via ORAL
  Filled 2020-06-07: qty 1

## 2020-06-07 MED ORDER — PENTIPS 32G X 4 MM MISC
200.0000 | Freq: Once | 11 refills | Status: AC
  Filled 2020-06-07: qty 200, 30d supply, fill #0

## 2020-06-07 MED ORDER — INSULIN ASPART 100 UNIT/ML IJ SOLN: 8.0000 [IU] | Freq: Three times a day (TID) | INTRAMUSCULAR | Status: DC

## 2020-06-07 MED ORDER — INSULIN DETEMIR 100 UNIT/ML FLEXPEN
PEN_INJECTOR | SUBCUTANEOUS | 11 refills | Status: DC
  Filled 2020-06-07: qty 15, 25d supply, fill #0
  Filled 2020-06-10: qty 15, 25d supply, fill #1

## 2020-06-07 NOTE — Progress Notes (Signed)
Hypoglycemic Event  CBG: 55 @ 0526  Treatment: 4oz apple juice @ 0530  Symptoms: none  Follow-up CBG: Time: 0548 CBG Result: 75  Possible Reasons for Event: unknown   Audrey Mcmillan

## 2020-06-07 NOTE — Progress Notes (Signed)
[place]) NOTE  Audrey Mcmillan is a 40 y.o. G2P1001 with Estimated Date of Delivery: 01/06/21   By  LMP [redacted]w[redacted]d  who is admitted for nephrolithiasis vs pyelonephritis.    Length of Stay:  1  Days  Date of admission:06/05/2020  Subjective: Pt feels great and wishes to go home.  She reports that she needs to return to work ASAP. Pt tolerating general diet.  No nausea/vomiting.  Voiding and ambulating without difficulty.  Denies any pelvic, abdominal or back pain.  Reports no acute complaints  Vitals:  Blood pressure (!) 115/56, pulse 77, temperature 98.3 F (36.8 C), temperature source Oral, resp. rate 18, weight 68.9 kg, last menstrual period 02/11/2020, SpO2 99 %. Vitals:   06/06/20 2223 06/07/20 0505 06/07/20 0506 06/07/20 0755  BP: 123/63 (!) 118/56  (!) 115/56  Pulse: 82  75 77  Resp: 17 16  18   Temp: 98.6 F (37 C) 98.3 F (36.8 C)  98.3 F (36.8 C)  TempSrc: Oral Oral  Oral  SpO2: 99% 100%  99%  Weight:       Physical Examination:  General appearance - alert, well appearing, and in no distress Mental status - normal mood and behavior Chest - clear to auscultation, no wheezes, rales or rhonchi, symmetric air entry Heart - normal rate and regular rhythm Abdomen - soft, nontender, nondistended Back exam - no CVA tenderness Extremities - no pedal edema noted Skin - warm and dry   Labs:  Results for orders placed or performed during the hospital encounter of 06/05/20 (from the past 24 hour(s))  Glucose, capillary   Collection Time: 06/06/20 11:06 AM  Result Value Ref Range   Glucose-Capillary 177 (H) 70 - 99 mg/dL  Glucose, capillary   Collection Time: 06/06/20  3:06 PM  Result Value Ref Range   Glucose-Capillary 220 (H) 70 - 99 mg/dL  Glucose, capillary   Collection Time: 06/06/20  9:53 PM  Result Value Ref Range   Glucose-Capillary 223 (H) 70 - 99 mg/dL  Glucose, capillary   Collection Time: 06/07/20  5:26 AM  Result Value Ref  Range   Glucose-Capillary 55 (L) 70 - 99 mg/dL  Glucose, capillary   Collection Time: 06/07/20  5:48 AM  Result Value Ref Range   Glucose-Capillary 75 70 - 99 mg/dL  CBC with Differential/Platelet   Collection Time: 06/07/20  6:00 AM  Result Value Ref Range   WBC 20.7 (H) 4.0 - 10.5 K/uL   RBC 3.27 (L) 3.87 - 5.11 MIL/uL   Hemoglobin 9.5 (L) 12.0 - 15.0 g/dL   HCT 29.1 (L) 36.0 - 46.0 %   MCV 89.0 80.0 - 100.0 fL   MCH 29.1 26.0 - 34.0 pg   MCHC 32.6 30.0 - 36.0 g/dL   RDW 14.0 11.5 - 15.5 %   Platelets 280 150 - 400 K/uL   nRBC 0.0 0.0 - 0.2 %   Neutrophils Relative % 83 %   Neutro Abs 17.2 (H) 1.7 - 7.7 K/uL   Lymphocytes Relative 10 %   Lymphs Abs 2.0 0.7 - 4.0 K/uL   Monocytes Relative 5 %   Monocytes Absolute 1.1 (H) 0.1 - 1.0 K/uL   Eosinophils Relative 1 %   Eosinophils Absolute 0.1 0.0 - 0.5 K/uL   Basophils Relative 0 %   Basophils Absolute 0.1 0.0 - 0.1 K/uL   Immature Granulocytes 1 %   Abs Immature Granulocytes 0.22 (H) 0.00 - 0.07 K/uL  Comprehensive metabolic panel   Collection  Time: 06/07/20  6:00 AM  Result Value Ref Range   Sodium 136 135 - 145 mmol/L   Potassium 3.0 (L) 3.5 - 5.1 mmol/L   Chloride 103 98 - 111 mmol/L   CO2 23 22 - 32 mmol/L   Glucose, Bld 81 70 - 99 mg/dL   BUN 15 6 - 20 mg/dL   Creatinine, Ser 1.16 (H) 0.44 - 1.00 mg/dL   Calcium 9.0 8.9 - 10.3 mg/dL   Total Protein 6.3 (L) 6.5 - 8.1 g/dL   Albumin 2.5 (L) 3.5 - 5.0 g/dL   AST 11 (L) 15 - 41 U/L   ALT 12 0 - 44 U/L   Alkaline Phosphatase 51 38 - 126 U/L   Total Bilirubin 0.3 0.3 - 1.2 mg/dL   GFR, Estimated >60 >60 mL/min   Anion gap 10 5 - 15    Imaging Studies:    US OB Comp Less 14 Wks  Result Date: 06/06/2020 CLINICAL DATA:  Initial evaluation for acute severe right lower quadrant pain, early pregnancy. EXAM: OBSTETRIC <14 WK ULTRASOUND TECHNIQUE: Transabdominal ultrasound was performed for evaluation of the gestation as well as the maternal uterus and adnexal regions.  COMPARISON:  None. FINDINGS: Intrauterine gestational sac: Single Yolk sac:  Present Embryo:  Present Cardiac Activity: Present Heart Rate: 182 bpm CRL: 26.1 mm   9 w 3 d                  [place] EDC: 01/05/2021 Subchorionic hemorrhage:  None visualized. Maternal uterus/adnexae: Ovaries within normal limits. No adnexal mass or free fluid. IMPRESSION: 1. Single viable intrauterine pregnancy as above, estimated gestational age [redacted] weeks and 3 days by crown-rump length, with ultrasound EDC of 01/05/2021. No complication. 2. No other acute maternal uterine or adnexal abnormality. Electronically Signed   By: Jeannine Boga M.D.   On: 06/06/2020 01:37   US Renal  Result Date: 06/06/2020 CLINICAL DATA:  Initial evaluation for acute right lower quadrant pain radiating to back. EXAM: RENAL / URINARY TRACT ULTRASOUND COMPLETE COMPARISON:  Prior ultrasound from 02/05/2020 FINDINGS: Right Kidney: Renal measurements: 13.8 x 7.5 x 6.5 cm = volume: 349.3 mL. Increased echogenicity seen within the renal parenchyma. Mild to moderate right-sided hydronephrosis. The visualized proximal right ureter is dilated. No visible echogenic calculi. No focal renal mass. Small amount of free perinephric fluid seen about the right kidney. Left Kidney: Renal measurements: 14.5 x 7.1 x 6.3 cm = volume: 340.3 mL. Mildly increased echogenicity within the renal parenchyma. No nephrolithiasis or hydronephrosis. No focal renal mass. Bladder: Appears normal for degree of bladder distention. A right jet was not visualized at the bladder. Other: None. IMPRESSION: 1. Mild to moderate right-sided hydronephrosis with trace free perinephric fluid. A right jet was not visualized at the bladder. 2. Mildly increased echogenicity within the renal parenchyma, suggesting medical renal disease. 3. Bilateral nephromegaly. Electronically Signed   By: Jeannine Boga M.D.   On: 06/06/2020 04:10     Medications:  Scheduled . cephALEXin  500 mg Oral BID  .  docusate sodium  100 mg Oral Daily  . insulin aspart  0-14 Units Subcutaneous TID PC  . insulin aspart  8 Units Subcutaneous TID WC  . insulin detemir  10 Units Subcutaneous Daily  . insulin detemir  50 Units Subcutaneous QHS  . levothyroxine  150 mcg Oral Q0600  . potassium chloride  40 mEq Oral BID  . prenatal multivitamin  1 tablet Oral Q1200   I have reviewed  the patient's current medications.  ASSESSMENT: G2P1001 [redacted]w[redacted]d Estimated Date of Delivery: 01/06/21 admitted due to flank pain concern for pyelonephritis vs nephrolithiasis with AKI  1) Pyelo vs Nephroliathisis, AKI -Rocephin IV x 2, plan to transition to oral Keflex -pt clinically improving, remains afebrile x 24hr -labs as above, Cr improving, white count noted -reviewed with patient urology recommendations- outpatient repeat US in 1-2wks -pain completely resolved  2) Type 2 DM -Diabetic coordinator consulted -Insulin changed as above  3) Hypothyroidism -pt had discontinue her medication, restarted and reviewed importance of continuing this medication, Levothyroxine 120mcg daily  4) Hypokalemia -plan for K replacement and outpatient follow up  DISPO: Pt strongly desires discharge today- remains afebrile and significant clinical improvement noted.  Plan to transition to oral antibiotics with close outpatient follow up.  Janyth Pupa, DO Attending [place] [employer], [place]

## 2020-06-07 NOTE — Progress Notes (Signed)
Inpatient Diabetes Program Recommendations  ADA Standards of Care 2022 Diabetes in Pregnancy Target Glucose Ranges:  Fasting: 60 - 90 mg/dL Preprandial: 60 - 105 mg/dL 1 hr postprandial: Less than 140mg /dL (from first bite of meal) 2 hr postprandial: Less than 120 mg/dL (from first bit of meal)   Lab Results  Component Value Date   GLUCAP 75 06/07/2020   HGBA1C 10.4 (A) 06/03/2020    Review of Glycemic Control Results for Audrey Mcmillan, Audrey Mcmillan (MRN [id number]) as of 06/07/2020 09:02  Ref. Range 06/06/2020 05:16 06/06/2020 11:06 06/06/2020 15:06 06/06/2020 21:53 06/07/2020 05:26 06/07/2020 05:48  Glucose-Capillary Latest Ref Range: 70 - 99 mg/dL 210 (H) 177 (H) 220 (H) 223 (H) 55 (L) 75   Diabetes history: DM type 2  Outpatient Diabetes medications: Lantus 30 units qam, 60 units qpm Current orders for Inpatient glycemic control:  Levemir 10 units Daily, 60 units qpm Novolog 0-14 units tid correction 2 hours post prandial Novolog 4 units tid meal coverage  Inpatient Diabetes Program Recommendations:    Hypoglycemia this am 55 mg/dl.  -  Reduce pm Levemir to 50 units -  Increase Novolog meal coverage to 8 units  Will watch trends on this regimen.  Thanks,  Tama Headings RN, MSN, BC-ADM Inpatient Diabetes Coordinator Team Pager [phone] (8a-5p)

## 2020-06-07 NOTE — Care Management Note (Signed)
Case Management Note  Patient Details  Name: Audrey Mcmillan MRN: [id number] Date of Birth: October 03, 1980  Subjective/Objective:                  Audrey Mcmillan is a 40 y.o. G2P1001 at [redacted]w[redacted]d who presents with severe abdominal and back pain.   In-House Referral:  Diabetes Coordinator  Discharge planning Services  [facility]   Additional Comments: Received consult from MD that patient needed assistance with medications prior to discharge.  Patient currently has no insurance.  Patient is early pregnancy and PCP is Juluis Mire NP and saw recently.  Patient qualifies for the [facility] program for medications and CM filled out the [facility] program and [place] will deliver patient's medications to patient's room prior to discharge.  Yong Channel, RN 06/07/2020, 10:37 AM

## 2020-06-07 NOTE — Discharge Instructions (Signed)
Pyelonephritis During Pregnancy  What are the causes? This condition is caused by a bacterial infection in the lower urinary tract that spreads to the kidney. What increases the risk? You are more likely to develop this condition if:  You have diabetes.  You have a history of frequent urinary tract infections. What are the signs or symptoms? Symptoms of this condition may begin with symptoms of a lower urinary tract infection. These may include:  A frequent urge to pass urine.  Burning pain when passing urine.  Pain and pressure in your lower abdomen.  Blood in your urine.  Cloudy or smelly urine. As the infection spreads to your kidney, you may have these symptoms:  Fever.  Chills.  Pain and tenderness in your upper abdomen or in your back and sides (flank pain). Flank pain often affects one side of the body, usually the right side.  Nausea, vomiting, or loss of appetite. How is this diagnosed? This condition may be diagnosed based on:  Your symptoms and medical history.  A physical exam.  Tests to confirm the diagnosis. These may include: ? Blood tests to check kidney function and look for signs of infection. ? Urine tests to check for signs of infection, including bacteria, white or red blood cells, and protein. ? Tests to grow and identify the type of bacteria that is causing the infection (urine culture). ? Imaging studies of your kidneys to learn more about your condition. How is this treated? This condition is treated in the hospital with antibiotics that are given into one of your veins through an IV. Your health care provider:  Will start you on an antibiotic that is effective against common urinary tract infections.  May switch to another antibiotic if the results of your urine culture show that your infection is caused by different bacteria.  Will be careful to choose antibiotics that are the safest during pregnancy. Other treatments may include:  IV  fluids if you are nauseous and not able to drink fluids.  Pain and fever medicines.  Medicines for nausea and vomiting. You will be able to go home when your infection is under control. To prevent another infection, you may need to continue taking antibiotics by mouth until your baby is born. Follow these instructions at home: Medicines  Take over-the-counter and prescription medicines only as told by your health care provider.  Take your antibiotic medicine as told by your health care provider. Do not stop taking the antibiotic even if you start to feel better.  Continue to take your prenatal vitamins.   Lifestyle  Follow instructions from your health care provider about eating or drinking restrictions. You may need to avoid: ? Foods and drinks with added sugar. ? Caffeine and fruit juice.  Drink enough fluid to keep your urine pale yellow.  Go to the bathroom frequently. Do not hold your urine. Try to empty your bladder completely.  Change your underwear every day. Wear all-cotton underwear. Do not wear tight underwear or pants.   General instructions  Take these steps to lower the risk of bacteria getting into your urinary tract: ? Use liquid soap instead of bar soap when showering or bathing. Bacteria can grow on bar soap. ? When you wash yourself, clean the urethra opening first. Use a washcloth to clean the area between your vagina and anus. Pat the area dry with a clean towel. ? Wash your hands before and after you go to the bathroom. ? Wipe yourself from front to  back after going to the bathroom. ? Do not use douches, perfumed soap, creams, or powders. ? Do not soak in a bath for more than 30 minutes.  Return to your normal activities as told by your health care provider. Ask your health care provider what activities are safe for you.  Keep all follow-up visits as told by your health care provider. This is important.   Contact a health care provider if:  You have  chills or a fever.  You have any symptoms of infection that do not get better at home.  Symptoms of infection come back.  You have a reaction or side effects from your antibiotic. Get help right away if:  You start having contractions. Summary  Pyelonephritis is an infection of the kidney or kidneys.  This condition results when a bacterial infection in the lower urinary tract spreads to the kidney.  Lower urinary tract infections are common during pregnancy.  Pyelonephritis causes chills, a fever, flank pain, and nausea.  Pyelonephritis is a serious infection that is usually treated in the hospital with IV antibiotics. This information is not intended to replace advice given to you by your health care provider. Make sure you discuss any questions you have with your health care provider. Document Revised: 05/02/2018 Document Reviewed: 04/11/2017 Elsevier Patient Education  2021 [employer].

## 2020-06-08 ENCOUNTER — Other Ambulatory Visit (INDEPENDENT_AMBULATORY_CARE_PROVIDER_SITE_OTHER): Payer: Self-pay | Admitting: Primary Care

## 2020-06-08 ENCOUNTER — Other Ambulatory Visit: Payer: Self-pay | Admitting: Obstetrics and Gynecology

## 2020-06-08 DIAGNOSIS — E039 Hypothyroidism, unspecified: Secondary | ICD-10-CM

## 2020-06-08 LAB — CULTURE, OB URINE: Culture: >=100000 — AB

## 2020-06-08 MED ORDER — LEVOTHYROXINE SODIUM 175 MCG PO TABS
175.0000 ug | ORAL_TABLET | Freq: Every day | ORAL | 0 refills | Status: DC
  Filled 2020-06-08: qty 30, 30d supply, fill #0

## 2020-06-09 ENCOUNTER — Other Ambulatory Visit: Payer: Self-pay

## 2020-06-09 NOTE — Discharge Summary (Signed)
Physician Discharge Summary  Patient ID: Audrey Mcmillan MRN: [id number] DOB/AGE: [birth year] 40 y.o.  Admit date: 06/05/2020 Discharge date: 06/07/2020  Admission Diagnoses: Pyelonephritis vs Kidney Stone  Discharge Diagnoses: Pyelonephritis Discharged Condition: stable  Hospital Course: 40yo G2P1001 admitted due to flank pain with concern for kidney stone vs pyelonephritis.  She was treated with IV Rocephin, fluids and pain management.  Symptoms markedly improved with treatment.  She remained afebrile for over 24hrs and desired outpatient treatment.  She was discharged home in stable condition with plans for outpatient follow up  Consults: None  Significant Diagnostic Studies: labs:  CBC Latest Ref Rng & Units 06/07/2020 06/05/2020 06/03/2020  WBC 4.0 - 10.5 K/uL 20.7(H) 14.3(H) 13.5(H)  Hemoglobin 12.0 - 15.0 g/dL 9.5(L) 11.8(L) 11.8  Hematocrit 36.0 - 46.0 % 29.1(L) 36.2 36.2  Platelets 150 - 400 K/uL 280 382 461(H)   Urine culture: E.Coli  Treatments: IV hydration, antibiotics: Rocephin and analgesia: Dilaudid  Discharge Exam: Blood pressure 116/62, pulse 76, temperature 98.1 F (36.7 C), temperature source Oral, resp. rate 18, weight 68.9 kg, last menstrual period 02/11/2020, SpO2 100 %. Physical Examination:  General appearance - alert, well appearing, and in no distress Mental status - normal mood and behavior Chest - clear to auscultation, no wheezes, rales or rhonchi, symmetric air entry Heart - normal rate and regular rhythm Abdomen - soft, nontender, nondistended Back exam - no CVA tenderness Extremities - no pedal edema noted Skin - warm and dry  Disposition: Discharge disposition: 01-Home or Self Care       Discharge Instructions    Discharge activity:  No Restrictions   Complete by: As directed    Discharge diet:  No restrictions   Complete by: As directed      Allergies as of 06/07/2020   No Known Allergies     Medication List    STOP  taking these medications   atorvastatin 40 MG tablet Commonly known as: LIPITOR   azithromycin 500 MG tablet Commonly known as: ZITHROMAX   cefdinir 300 MG capsule Commonly known as: OMNICEF   insulin glargine 100 UNIT/ML injection Commonly known as: LANTUS     TAKE these medications   cephALEXin 500 MG capsule Commonly known as: KEFLEX Take 1 capsule (500 mg total) by mouth 2 (two) times daily for 14 days.   FeroSul 325 (65 FE) MG tablet Generic drug: ferrous sulfate TAKE 1 TABLET (325 MG TOTAL) BY MOUTH DAILY WITH BREAKFAST.   glucose blood test strip Commonly known as: True Metrix Blood Glucose Test Use as instructed. Check blood glucose levels twice per day.   insulin aspart 100 UNIT/ML injection Commonly known as: novoLOG Inject 8 Units into the skin 3 (three) times daily with meals.   NovoLOG FlexPen 100 UNIT/ML FlexPen Generic drug: insulin aspart Inject 8 Units into the skin 3 (three) times daily with meals.   insulin detemir 100 UNIT/ML injection Commonly known as: LEVEMIR Inject 0.5 mLs (50 Units total) into the skin at bedtime.   Levemir FlexTouch 100 UNIT/ML FlexPen Generic drug: insulin detemir Inject 10 unit in the morning, inject 50 units in the evening.   INSULIN SYRINGE 1CC/30GX5/16" 30G X 5/16" 1 ML Misc Inject 1 application into the skin 2 times daily at 12 noon and 4 pm.   potassium chloride SA 20 MEQ tablet Commonly known as: KLOR-CON Take 2 tablets (40 mEq total) by mouth 2 (two) times daily for 5 days.   potassium chloride SA 20 MEQ tablet Commonly known  as: KLOR-CON Take 2 tablets (40 mEq total) by mouth 2 (two) times daily for 2 days.   PRENATAL VITAMINS PO Take 1 tablet by mouth daily.   True Metrix Meter w/Device Kit Use as instructed   TRUEplus Lancets 28G Misc Use as instructed. Check blood glucose levels twice per day.     ASK your doctor about these medications   PenTips 32G X 4 MM Misc Generic drug: Insulin Pen  Needle Use como se indica. (use as directed) Ask about: Should I take this medication?       Follow-up Information    [facility] Follow up in 1 week(s).   Why: Please call to schedule appointment in 1-2 weeks Contact information: [place] [facility] Follow up in 3 day(s).   Specialty: Obstetrics and Gynecology Why: Follow up as scheduled on Friday Contact information: [place] [phone]              Signed: Annalee Genta 06/09/2020, 2:22 PM

## 2020-06-10 ENCOUNTER — Ambulatory Visit (INDEPENDENT_AMBULATORY_CARE_PROVIDER_SITE_OTHER): Payer: Self-pay | Admitting: Family Medicine

## 2020-06-10 ENCOUNTER — Other Ambulatory Visit: Payer: Self-pay

## 2020-06-10 ENCOUNTER — Encounter: Payer: Self-pay | Admitting: Family Medicine

## 2020-06-10 VITALS — BP 136/76 | HR 75 | Wt 155.6 lb

## 2020-06-10 DIAGNOSIS — Z789 Other specified health status: Secondary | ICD-10-CM

## 2020-06-10 DIAGNOSIS — O09521 Supervision of elderly multigravida, first trimester: Secondary | ICD-10-CM

## 2020-06-10 DIAGNOSIS — O26831 Pregnancy related renal disease, first trimester: Secondary | ICD-10-CM

## 2020-06-10 DIAGNOSIS — E1142 Type 2 diabetes mellitus with diabetic polyneuropathy: Secondary | ICD-10-CM

## 2020-06-10 DIAGNOSIS — Z794 Long term (current) use of insulin: Secondary | ICD-10-CM

## 2020-06-10 DIAGNOSIS — N179 Acute kidney failure, unspecified: Secondary | ICD-10-CM

## 2020-06-10 DIAGNOSIS — N200 Calculus of kidney: Secondary | ICD-10-CM

## 2020-06-10 DIAGNOSIS — O0990 Supervision of high risk pregnancy, unspecified, unspecified trimester: Secondary | ICD-10-CM

## 2020-06-10 DIAGNOSIS — E039 Hypothyroidism, unspecified: Secondary | ICD-10-CM

## 2020-06-10 DIAGNOSIS — N390 Urinary tract infection, site not specified: Secondary | ICD-10-CM

## 2020-06-10 NOTE — Patient Instructions (Addendum)
[facility]CCCP (Breast and Cervical Cancer Control Program) [id number] [place] para hacer una cita para la citologia ([place])    [email or web address]">  Gestational Diabetes Mellitus, Diagnosis  Gestational diabetes mellitus, or gestational diabetes, is a form of diabetes that some women get during pregnancy. To control blood sugar (glucose) in the body, the pancreas makes a hormone called insulin. This hormone allows glucose to enter the cells in the body. The cells use glucose for energy. However, for some pregnant women, the pancreas may not make enough insulin or the body may not use available insulin properly. This leads to gestational diabetes. Gestational diabetes lasts only a short time. It usually happens at weeks 24-28 of pregnancy and goes away after delivery. However, women who get gestational diabetes are more likely to:  Get it again if they become pregnant.  Develop type 2 diabetes in the future. Gestational diabetes is not likely to cause problems if it is treated. If not controlled with treatment, it may cause problems during labor and delivery. Some problems can harm the baby and the mother. What are the causes? This condition occurs during pregnancy when the woman's body makes growth hormones that help the baby grow. Sometimes, these hormones:  Cause the pancreas to work harder than normal to make more insulin. Sometimes, the pancreas still cannot make enough insulin.  Cause insulin resistance. This makes it hard for the cells to use insulin properly. Insulin resistance or lack of insulin causes extra glucose to build up in the blood instead of going into cells. This leads to high blood glucose (hyperglycemia). What increases the risk? This condition is more likely to develop in pregnant women who:  Are older than age 60 during pregnancy.  Have a family history of diabetes.  Are overweight.  Have had gestational diabetes in the past.  Have polycystic ovary syndrome  (PCOS).  Are pregnant with twins or other multiples. What are the signs or symptoms? Common symptoms of this condition include:  Increased thirst.  Increased hunger.  Needing to urinate more often. Most women do not notice these symptoms because the symptoms are similar to other symptoms of pregnancy. How is this diagnosed? This condition may be diagnosed based on your blood glucose level. This may be checked:  After you fast for 8 hours or longer (fasting blood glucose test, or FBG).  Any time of day, no matter when you eat (random glucose test).  At weeks 24-28 of pregnancy, to check how your body responds to glucose (oral glucose tolerance test, or OGTT). If you have risk factors, you may be screened for type 2 diabetes at your first health care visit during your pregnancy. How is this treated? Treatment for this condition depends on the stage of your pregnancy and any other medical conditions you may have. Treatment may include:  Eating a healthy diet and getting more physical activity. These are the most important ways to manage gestational diabetes.  Checking your blood glucose as often as told.  Taking insulin or other diabetes medicines every day. These medicines will be prescribed only if needed. You may need to work with a diabetes specialist, or endocrinologist, on a treatment plan. The goal of treatment is to have the right blood glucose levels during your pregnancy. The levels are checked while you are fasting and after you eat. Your health care provider will tell you what those levels are. Follow these instructions at home: Learn about your condition Learn as much as you can about your condition. Ask your  health care provider:  How often should I check my blood glucose, and where do I get the equipment?  What diabetes medicines do I need, and when should I take them?  Do I need to meet with a certified diabetes care and education specialist?  What number can I  call if I have questions?  Where can I find a support group for people with gestational diabetes? General instructions  Take over-the-counter and prescription medicines only as told by your health care provider.  Manage your weight gain during pregnancy. Your expected weight gain depends on your BMI (body mass index) before pregnancy.  Drink enough fluid to keep your urine pale yellow.  Carry a medical alert card or wear medical alert jewelry that says you have gestational diabetes.  Keep all follow-up visits. This is important. Where to find more information  American Diabetes Association (ADA): diabetes.org  Association of Diabetes Care & Education Specialists (ADCES): diabeteseducator.org  Centers for Disease Control and Prevention (CDC): StoreMirror.com.cy  American Pregnancy Association: americanpregnancy.org  [place]. Department of Agriculture MyPlate: http://www.wilson-mendoza.org/ Contact a health care provider if you:  Have a blood glucose level at or above: ? 240 mg/dL (13.3 mmol/L). ? 200 mg/dL (11.1 mmol/L), and you have ketones in your urine. Ketones are made by the liver when a lack of glucose forces the body to use fat for energy.  Have a fever.  Have been sick for 2 days or more and are not getting better.  Have either of these problems for more than 6 hours: ? Vomiting every time you eat or drink. ? Diarrhea. Get help right away if you:  Become confused or cannot think clearly.  Have trouble breathing.  Have moderate or high ketone levels in your urine.  Feel that your baby is moving around less than usual.  Develop unusual discharge or bleeding from your vagina.  Start having early (premature) contractions. Contractions may feel like a tightening in your lower abdomen.  Have a severe headache. These symptoms may represent a serious problem that is an emergency. Do not wait to see if the symptoms will go away. Get medical help right away. Call your local emergency services (911 in  the [place] Do not drive yourself to the hospital. Summary  Gestational diabetes mellitus is a form of diabetes that some women get during pregnancy. It usually happens at weeks 24-28 of pregnancy and goes away after delivery.  Treatment may include eating a healthy diet and getting more physical activity. You may also be given insulin or other diabetes medicines.  Contact a health care provider if your glucose levels reach 240 mg/dL (13.3 mmol/L) or higher.  Get help right away if you become confused or cannot think clearly, or you feel that your baby is moving around less than usual. This information is not intended to replace advice given to you by your health care provider. Make sure you discuss any questions you have with your health care provider. Document Revised: 06/15/2019 Document Reviewed: 06/15/2019 Elsevier Patient Education  [place].   Contraception Choices Contraception, also called birth control, refers to methods or devices that prevent pregnancy. Hormonal methods Contraceptive implant A contraceptive implant is a thin, plastic tube that contains a hormone that prevents pregnancy. It is different from an intrauterine device (IUD). It is inserted into the upper part of the arm by a health care provider. Implants can be effective for up to 3 years. Progestin-only injections Progestin-only injections are injections of progestin, a synthetic  form of the hormone progesterone. They are given every 3 months by a health care provider. Birth control pills Birth control pills are pills that contain hormones that prevent pregnancy. They must be taken once a day, preferably at the same time each day. A prescription is needed to use this method of contraception. Birth control patch The birth control patch contains hormones that prevent pregnancy. It is placed on the skin and must be changed once a week for three weeks and removed on the fourth week. A prescription is needed to  use this method of contraception. Vaginal ring A vaginal ring contains hormones that prevent pregnancy. It is placed in the vagina for three weeks and removed on the fourth week. After that, the process is repeated with a new ring. A prescription is needed to use this method of contraception. Emergency contraceptive Emergency contraceptives prevent pregnancy after unprotected sex. They come in pill form and can be taken up to 5 days after sex. They work best the sooner they are taken after having sex. Most emergency contraceptives are available without a prescription. This method should not be used as your only form of birth control.   Barrier methods Female condom A female condom is a thin sheath that is worn over the penis during sex. Condoms keep sperm from going inside a woman's body. They can be used with a sperm-killing substance (spermicide) to increase their effectiveness. They should be thrown away after one use. Female condom A female condom is a soft, loose-fitting sheath that is put into the vagina before sex. The condom keeps sperm from going inside a woman's body. They should be thrown away after one use. Diaphragm A diaphragm is a soft, dome-shaped barrier. It is inserted into the vagina before sex, along with a spermicide. The diaphragm blocks sperm from entering the uterus, and the spermicide kills sperm. A diaphragm should be left in the vagina for 6-8 hours after sex and removed within 24 hours. A diaphragm is prescribed and fitted by a health care provider. A diaphragm should be replaced every 1-2 years, after giving birth, after gaining more than 15 lb (6.8 kg), and after pelvic surgery. Cervical cap A cervical cap is a round, soft latex or plastic cup that fits over the cervix. It is inserted into the vagina before sex, along with spermicide. It blocks sperm from entering the uterus. The cap should be left in place for 6-8 hours after sex and removed within 48 hours. A cervical cap  must be prescribed and fitted by a health care provider. It should be replaced every 2 years. Sponge A sponge is a soft, circular piece of polyurethane foam with spermicide in it. The sponge helps block sperm from entering the uterus, and the spermicide kills sperm. To use it, you make it wet and then insert it into the vagina. It should be inserted before sex, left in for at least 6 hours after sex, and removed and thrown away within 30 hours. Spermicides Spermicides are chemicals that kill or block sperm from entering the cervix and uterus. They can come as a cream, jelly, suppository, foam, or tablet. A spermicide should be inserted into the vagina with an applicator at least [id number] minutes before sex to allow time for it to work. The process must be repeated every time you have sex. Spermicides do not require a prescription.   Intrauterine contraception Intrauterine device (IUD) An IUD is a T-shaped device that is put in a woman's uterus. There are  two types:  Hormone IUD.This type contains progestin, a synthetic form of the hormone progesterone. This type can stay in place for 3-5 years.  Copper IUD.This type is wrapped in copper wire. It can stay in place for 10 years. Permanent methods of contraception Female tubal ligation In this method, a woman's fallopian tubes are sealed, tied, or blocked during surgery to prevent eggs from traveling to the uterus. Hysteroscopic sterilization In this method, a small, flexible insert is placed into each fallopian tube. The inserts cause scar tissue to form in the fallopian tubes and block them, so sperm cannot reach an egg. The procedure takes about 3 months to be effective. Another form of birth control must be used during those 3 months. Female sterilization This is a procedure to tie off the tubes that carry sperm (vasectomy). After the procedure, the man can still ejaculate fluid (semen). Another form of birth control must be used for 3 months after the  procedure. Natural planning methods Natural family planning In this method, a couple does not have sex on days when the woman could become pregnant. Calendar method In this method, the woman keeps track of the length of each menstrual cycle, identifies the days when pregnancy can happen, and does not have sex on those days. Ovulation method In this method, a couple avoids sex during ovulation. Symptothermal method This method involves not having sex during ovulation. The woman typically checks for ovulation by watching changes in her temperature and in the consistency of cervical mucus. Post-ovulation method In this method, a couple waits to have sex until after ovulation. Where to find more information  Centers for Disease Control and Prevention: http://www.wolf.info/ Summary  Contraception, also called birth control, refers to methods or devices that prevent pregnancy.  Hormonal methods of contraception include implants, injections, pills, patches, vaginal rings, and emergency contraceptives.  Barrier methods of contraception can include female condoms, female condoms, diaphragms, cervical caps, sponges, and spermicides.  There are two types of IUDs (intrauterine devices). An IUD can be put in a woman's uterus to prevent pregnancy for 3-5 years.  Permanent sterilization can be done through a procedure for males and females. Natural family planning methods involve nothaving sex on days when the woman could become pregnant. This information is not intended to replace advice given to you by your health care provider. Make sure you discuss any questions you have with your health care provider. Document Revised: 06/15/2019 Document Reviewed: 06/15/2019 Elsevier Patient Education  [place].   Breastfeeding  Choosing to breastfeed is one of the best decisions you can make for yourself and your baby. A change in hormones during pregnancy causes your breasts to make breast milk in your  milk-producing glands. Hormones prevent breast milk from being released before your baby is born. They also prompt milk flow after birth. Once breastfeeding has begun, thoughts of your baby, as well as his or her sucking or crying, can stimulate the release of milk from your milk-producing glands. Benefits of breastfeeding Research shows that breastfeeding offers many health benefits for infants and mothers. It also offers a cost-free and convenient way to feed your baby. For your baby  Your first milk (colostrum) helps your baby's digestive system to function better.  Special cells in your milk (antibodies) help your baby to fight off infections.  Breastfed babies are less likely to develop asthma, allergies, obesity, or type 2 diabetes. They are also at lower risk for sudden infant death syndrome (SIDS).  Nutrients in  breast milk are better able to meet your baby's needs compared to infant formula.  Breast milk improves your baby's brain development. For you  Breastfeeding helps to create a very special bond between you and your baby.  Breastfeeding is convenient. Breast milk costs nothing and is always available at the correct temperature.  Breastfeeding helps to burn calories. It helps you to lose the weight that you gained during pregnancy.  Breastfeeding makes your uterus return faster to its size before pregnancy. It also slows bleeding (lochia) after you give birth.  Breastfeeding helps to lower your risk of developing type 2 diabetes, osteoporosis, rheumatoid arthritis, cardiovascular disease, and breast, ovarian, uterine, and endometrial cancer later in life. Breastfeeding basics Starting breastfeeding  Find a comfortable place to sit or lie down, with your neck and back well-supported.  Place a pillow or a rolled-up blanket under your baby to bring him or her to the level of your breast (if you are seated). Nursing pillows are specially designed to help support your arms and  your baby while you breastfeed.  Make sure that your baby's tummy (abdomen) is facing your abdomen.  Gently massage your breast. With your fingertips, massage from the outer edges of your breast inward toward the nipple. This encourages milk flow. If your milk flows slowly, you may need to continue this action during the feeding.  Support your breast with 4 fingers underneath and your thumb above your nipple (make the letter "C" with your hand). Make sure your fingers are well away from your nipple and your baby's mouth.  Stroke your baby's lips gently with your finger or nipple.  When your baby's mouth is open wide enough, quickly bring your baby to your breast, placing your entire nipple and as much of the areola as possible into your baby's mouth. The areola is the colored area around your nipple. ? More areola should be visible above your baby's upper lip than below the lower lip. ? Your baby's lips should be opened and extended outward (flanged) to ensure an adequate, comfortable latch. ? Your baby's tongue should be between his or her lower gum and your breast.  Make sure that your baby's mouth is correctly positioned around your nipple (latched). Your baby's lips should create a seal on your breast and be turned out (everted).  It is common for your baby to suck about 2-3 minutes in order to start the flow of breast milk. Latching Teaching your baby how to latch onto your breast properly is very important. An improper latch can cause nipple pain, decreased milk supply, and poor weight gain in your baby. Also, if your baby is not latched onto your nipple properly, he or she may swallow some air during feeding. This can make your baby fussy. Burping your baby when you switch breasts during the feeding can help to get rid of the air. However, teaching your baby to latch on properly is still the best way to prevent fussiness from swallowing air while breastfeeding. Signs that your baby has  successfully latched onto your nipple  Silent tugging or silent sucking, without causing you pain. Infant's lips should be extended outward (flanged).  Swallowing heard between every 3-4 sucks once your milk has started to flow (after your let-down milk reflex occurs).  Muscle movement above and in front of his or her ears while sucking. Signs that your baby has not successfully latched onto your nipple  Sucking sounds or smacking sounds from your baby while breastfeeding.  Nipple pain. If you think your baby has not latched on correctly, slip your finger into the corner of your baby's mouth to break the suction and place it between your baby's gums. Attempt to start breastfeeding again. Signs of successful breastfeeding Signs from your baby  Your baby will gradually decrease the number of sucks or will completely stop sucking.  Your baby will fall asleep.  Your baby's body will relax.  Your baby will retain a small amount of milk in his or her mouth.  Your baby will let go of your breast by himself or herself. Signs from you  Breasts that have increased in firmness, weight, and size 1-3 hours after feeding.  Breasts that are softer immediately after breastfeeding.  Increased milk volume, as well as a change in milk consistency and color by the fifth day of breastfeeding.  Nipples that are not sore, cracked, or bleeding. Signs that your baby is getting enough milk  Wetting at least 1-2 diapers during the first 24 hours after birth.  Wetting at least 5-6 diapers every 24 hours for the first week after birth. The urine should be clear or pale yellow by the age of 5 days.  Wetting 6-8 diapers every 24 hours as your baby continues to grow and develop.  At least 3 stools in a 24-hour period by the age of 5 days. The stool should be soft and yellow.  At least 3 stools in a 24-hour period by the age of 7 days. The stool should be seedy and yellow.  No loss of weight greater  than 10% of birth weight during the first 3 days of life.  Average weight gain of 4-7 oz (113-198 g) per week after the age of 4 days.  Consistent daily weight gain by the age of 5 days, without weight loss after the age of 2 weeks. After a feeding, your baby may spit up a small amount of milk. This is normal. Breastfeeding frequency and duration Frequent feeding will help you make more milk and can prevent sore nipples and extremely full breasts (breast engorgement). Breastfeed when you feel the need to reduce the fullness of your breasts or when your baby shows signs of hunger. This is called "breastfeeding on demand." Signs that your baby is hungry include:  Increased alertness, activity, or restlessness.  Movement of the head from side to side.  Opening of the mouth when the corner of the mouth or cheek is stroked (rooting).  Increased sucking sounds, smacking lips, cooing, sighing, or squeaking.  Hand-to-mouth movements and sucking on fingers or hands.  Fussing or crying. Avoid introducing a pacifier to your baby in the first 4-6 weeks after your baby is born. After this time, you may choose to use a pacifier. Research has shown that pacifier use during the first year of a baby's life decreases the risk of sudden infant death syndrome (SIDS). Allow your baby to feed on each breast as long as he or she wants. When your baby unlatches or falls asleep while feeding from the first breast, offer the second breast. Because newborns are often sleepy in the first few weeks of life, you may need to awaken your baby to get him or her to feed. Breastfeeding times will vary from baby to baby. However, the following rules can serve as a guide to help you make sure that your baby is properly fed:  Newborns (babies 63 weeks of age or younger) may breastfeed every 1-3 hours.  Newborns should  not go without breastfeeding for longer than 3 hours during the day or 5 hours during the night.  You should  breastfeed your baby a minimum of 8 times in a 24-hour period. Breast milk pumping Pumping and storing breast milk allows you to make sure that your baby is exclusively fed your breast milk, even at times when you are unable to breastfeed. This is especially important if you go back to work while you are still breastfeeding, or if you are not able to be present during feedings. Your lactation consultant can help you find a method of pumping that works best for you and give you guidelines about how long it is safe to store breast milk.      Caring for your breasts while you breastfeed Nipples can become dry, cracked, and sore while breastfeeding. The following recommendations can help keep your breasts moisturized and healthy:  Avoid using soap on your nipples.  Wear a supportive bra designed especially for nursing. Avoid wearing underwire-style bras or extremely tight bras (sports bras).  Air-dry your nipples for 3-4 minutes after each feeding.  Use only cotton bra pads to absorb leaked breast milk. Leaking of breast milk between feedings is normal.  Use lanolin on your nipples after breastfeeding. Lanolin helps to maintain your skin's normal moisture barrier. Pure lanolin is not harmful (not toxic) to your baby. You may also hand express a few drops of breast milk and gently massage that milk into your nipples and allow the milk to air-dry. In the first few weeks after giving birth, some women experience breast engorgement. Engorgement can make your breasts feel heavy, warm, and tender to the touch. Engorgement peaks within 3-5 days after you give birth. The following recommendations can help to ease engorgement:  Completely empty your breasts while breastfeeding or pumping. You may want to start by applying warm, moist heat (in the shower or with warm, water-soaked hand towels) just before feeding or pumping. This increases circulation and helps the milk flow. If your baby does not completely  empty your breasts while breastfeeding, pump any extra milk after he or she is finished.  Apply ice packs to your breasts immediately after breastfeeding or pumping, unless this is too uncomfortable for you. To do this: ? Put ice in a plastic bag. ? Place a towel between your skin and the bag. ? Leave the ice on for 20 minutes, 2-3 times a day.  Make sure that your baby is latched on and positioned properly while breastfeeding. If engorgement persists after 48 hours of following these recommendations, contact your health care provider or a Science writer. Overall health care recommendations while breastfeeding  Eat 3 healthy meals and 3 snacks every day. Well-nourished mothers who are breastfeeding need an additional 450-500 calories a day. You can meet this requirement by increasing the amount of a balanced diet that you eat.  Drink enough water to keep your urine pale yellow or clear.  Rest often, relax, and continue to take your prenatal vitamins to prevent fatigue, stress, and low vitamin and mineral levels in your body (nutrient deficiencies).  Do not use any products that contain nicotine or tobacco, such as cigarettes and e-cigarettes. Your baby may be harmed by chemicals from cigarettes that pass into breast milk and exposure to secondhand smoke. If you need help quitting, ask your health care provider.  Avoid alcohol.  Do not use illegal drugs or marijuana.  Talk with your health care provider before taking any medicines. These  include over-the-counter and prescription medicines as well as vitamins and herbal supplements. Some medicines that may be harmful to your baby can pass through breast milk.  It is possible to become pregnant while breastfeeding. If birth control is desired, ask your health care provider about options that will be safe while breastfeeding your baby. Where to find more information: [employer]: www.llli.org Contact a health care  provider if:  You feel like you want to stop breastfeeding or have become frustrated with breastfeeding.  Your nipples are cracked or bleeding.  Your breasts are red, tender, or warm.  You have: ? Painful breasts or nipples. ? A swollen area on either breast. ? A fever or chills. ? Nausea or vomiting. ? Drainage other than breast milk from your nipples.  Your breasts do not become full before feedings by the fifth day after you give birth.  You feel sad and depressed.  Your baby is: ? Too sleepy to eat well. ? Having trouble sleeping. ? More than 29 week old and wetting fewer than 6 diapers in a 24-hour period. ? Not gaining weight by 27 days of age.  Your baby has fewer than 3 stools in a 24-hour period.  Your baby's skin or the white parts of his or her eyes become yellow. Get help right away if:  Your baby is overly tired (lethargic) and does not want to wake up and feed.  Your baby develops an unexplained fever. Summary  Breastfeeding offers many health benefits for infant and mothers.  Try to breastfeed your infant when he or she shows early signs of hunger.  Gently tickle or stroke your baby's lips with your finger or nipple to allow the baby to open his or her mouth. Bring the baby to your breast. Make sure that much of the areola is in your baby's mouth. Offer one side and burp the baby before you offer the other side.  Talk with your health care provider or lactation consultant if you have questions or you face problems as you breastfeed. This information is not intended to replace advice given to you by your health care provider. Make sure you discuss any questions you have with your health care provider. Document Revised: 04/04/2017 Document Reviewed: 02/10/2016 Elsevier Patient Education  2021 [employer].

## 2020-06-10 NOTE — Progress Notes (Signed)
Subjective:   Audrey Mcmillan is a 40 y.o. G2P1001 at 86w0dby early ultrasound being seen today for her first obstetrical visit.  Her obstetrical history is significant for advanced maternal age and T2DM, hypothyroidism, CKD. Patient does intend to breast feed. Pregnancy history fully reviewed.  Patient reports no complaints.   HISTORY: OB History  Gravida Para Term Preterm AB Living  2 1 1  0 0 1  SAB IAB Ectopic Multiple Live Births  0 0 0 0 1    # Outcome Date GA Lbr Len/2nd Weight Sex Delivery Anes PTL Lv  2 Current           1 Term            Lab Results  Component Value Date   DIAGPAP  03/27/2017    NEGATIVE FOR INTRAEPITHELIAL LESIONS OR MALIGNANCY.    Past Medical History:  Diagnosis Date  . Diabetes mellitus without complication ([place]    type 2  . High cholesterol   . Hyperthyroidism    Past Surgical History:  Procedure Laterality Date  . NO PAST SURGERIES     Family History  Family history unknown: Yes   Social History   Tobacco Use  . Smoking status: Never Smoker  . Smokeless tobacco: Never Used  Vaping Use  . Vaping Use: Never used  Substance Use Topics  . Alcohol use: No    Comment: occ  . Drug use: No   No Known Allergies Current Outpatient Medications on File Prior to Visit  Medication Sig Dispense Refill  . Blood Glucose Monitoring Suppl (TRUE METRIX METER) w/Device KIT Use as instructed 1 kit 0  . cephALEXin (KEFLEX) 500 MG capsule Take 1 capsule (500 mg total) by mouth 2 (two) times daily for 14 days. 28 capsule 0  . ferrous sulfate 325 (65 FE) MG tablet TAKE 1 TABLET (325 MG TOTAL) BY MOUTH DAILY WITH BREAKFAST. 30 tablet 1  . glucose blood (TRUE METRIX BLOOD GLUCOSE TEST) test strip Use as instructed. Check blood glucose levels twice per day. 100 each 12  . insulin aspart (NOVOLOG) 100 UNIT/ML FlexPen Inject 8 Units into the skin 3 (three) times daily with meals. 15 mL 11  . insulin detemir (LEVEMIR) 100 UNIT/ML FlexPen Inject  10 unit in the morning, inject 50 units in the evening. 18 mL 11  . Insulin Syringe-Needle U-100 (INSULIN SYRINGE 1CC/30GX5/16") 30G X 5/16" 1 ML MISC Inject 1 application into the skin 2 times daily at 12 noon and 4 pm. 100 each 5  . levothyroxine (SYNTHROID) 175 MCG tablet Take 1 tablet (175 mcg total) by mouth daily. 30 tablet 0  . potassium chloride SA (KLOR-CON) 20 MEQ tablet Take 2 tablets (40 mEq total) by mouth 2 (two) times daily for 5 days. 20 tablet 0  . Prenatal Vit-Fe Fumarate-FA (PRENATAL VITAMINS PO) Take 1 tablet by mouth daily.    . TRUEplus Lancets 28G MISC Use as instructed. Check blood glucose levels twice per day. 100 each 3  . insulin aspart (NOVOLOG) 100 UNIT/ML injection Inject 8 Units into the skin 3 (three) times daily with meals. 10 mL 11  . insulin detemir (LEVEMIR) 100 UNIT/ML injection Inject 0.5 mLs (50 Units total) into the skin at bedtime. 10 mL 11  . potassium chloride SA (KLOR-CON) 20 MEQ tablet Take 2 tablets (40 mEq total) by mouth 2 (two) times daily for 2 days. 8 tablet 0   No current facility-administered medications on file prior  to visit.     Exam   Vitals:   06/10/20 0915  BP: 136/76  Pulse: 75  Weight: 155 lb 9.6 oz (70.6 kg)   Fetal Heart Rate (bpm): 164  System: General: well-developed, well-nourished female in no acute distress   Skin: normal coloration and turgor, no rashes   Neurologic: oriented, normal, negative, normal mood   Extremities: normal strength, tone, and muscle mass, ROM of all joints is normal   HEENT PERRLA, extraocular movement intact and sclera clear, anicteric   Neck supple and no masses   Respiratory:  no respiratory distress     Assessment:   Pregnancy: G2P1001 Patient Active Problem List   Diagnosis Date Noted  . Kidney stone complicating pregnancy, first trimester 06/06/2020  . Supervision of high risk pregnancy, antepartum 05/31/2020  . AMA (advanced maternal age) multigravida 35+ 05/31/2020  .  Preexisting diabetes complicating pregnancy, antepartum 05/31/2020  . Language barrier 05/31/2020  . AKI (acute kidney injury) ([place]) 02/05/2020  . Acute lower UTI 02/05/2020  . Flu-like symptoms 02/05/2020  . Type 2 diabetes mellitus with diabetic polyneuropathy, with long-term current use of insulin ([place]) 06/27/2017  . Elevated lipoprotein(a) 03/27/2017  . Acquired hypothyroidism 03/27/2017     Plan:  1. Supervision of high risk pregnancy, antepartum FHR and BP normal Pap deferred given insurance status, referred to [facility] Initial labs drawn. Continue prenatal vitamins. Genetic Screening discussed, prefers to complete genetic screening and wait until next visit to see what cost would be, if excessive can do quad screen Ultrasound discussed; fetal anatomic survey: ordered Problem list reviewed and updated. The nature of [place] with multiple MDs and other Advanced Practice Providers was explained to patient; also emphasized that residents, students are part of our team.   2. Type 2 diabetes mellitus with diabetic polyneuropathy, with long-term current use of insulin ([place]) Reports she was diagnosed 8 years ago on routine visit to the doctor Current regimen Levemir 10u qAM and 50u qPM, novolog 8u TID Forgot sugar log today Most recent A1c 10.4% during recent admission Will have her back in 1-2 weeks for review of sugar log and titration of regimen Will need ophthalmology, fetal echo ordered at next visit Baseline CMP, UPCR today Reports no issues obtaining medications currently, encouraged her to call us immediately if she has any problems  3. Acquired hypothyroidism On synthroid 152mg daily  Has not yet picked up rx, encouraged her to do so Recheck TSH at next visit as only just resumed this medication during recent admission  4. AKI (acute kidney injury) ([place] Suspect patient actually has CKD given long hx of uncontrolled DM2 with baseline  Cr around 1-1.2 Plan after admission in January 2022 when Cr was 4 on admission to see Nephrology, will refer again Check CMP today  5. Acute lower UTI Admitted w suspected pyelo and nephrolithiasis, started on Keflex which she reports she is taking and has no symptoms TOC next visit  6. Kidney stone complicating pregnancy, first trimester Symptoms resolved  7. Multigravida of advanced maternal age in first trimester   8. Language barrier Spanish  Routine obstetric precautions reviewed. [facility]24[facility]2020[facility]

## 2020-06-11 LAB — COMPREHENSIVE METABOLIC PANEL
ALT: 10 IU/L (ref 0–32)
AST: 7 IU/L (ref 0–40)
Albumin/Globulin Ratio: 1.1 — ABNORMAL LOW (ref 1.2–2.2)
Albumin: 4.2 g/dL (ref 3.8–4.8)
Alkaline Phosphatase: 84 IU/L (ref 44–121)
BUN/Creatinine Ratio: 15 (ref 9–23)
BUN: 14 mg/dL (ref 6–20)
Bilirubin Total: 0.3 mg/dL (ref 0.0–1.2)
CO2: 19 mmol/L — ABNORMAL LOW (ref 20–29)
Calcium: 10.5 mg/dL — ABNORMAL HIGH (ref 8.7–10.2)
Chloride: 102 mmol/L (ref 96–106)
Creatinine, Ser: 0.96 mg/dL (ref 0.57–1.00)
Globulin, Total: 3.8 g/dL (ref 1.5–4.5)
Glucose: 82 mg/dL (ref 65–99)
Potassium: 4.4 mmol/L (ref 3.5–5.2)
Sodium: 137 mmol/L (ref 134–144)
Total Protein: 8 g/dL (ref 6.0–8.5)
eGFR: 78 mL/min/{1.73_m2} (ref >59)

## 2020-06-11 LAB — PROTEIN / CREATININE RATIO, URINE
Creatinine, Urine: 38.7 mg/dL
Protein, Ur: 15.5 mg/dL
Protein/Creat Ratio: 401 mg/g creat — ABNORMAL HIGH (ref 0–200)

## 2020-06-11 LAB — HIV ANTIBODY (ROUTINE TESTING W REFLEX): HIV Screen 4th Generation wRfx: NONREACTIVE

## 2020-06-11 LAB — HEPATITIS C ANTIBODY: Hep C Virus Ab: <0.1 s/co ratio (ref 0.0–0.9)

## 2020-06-11 LAB — RUBELLA SCREEN: Rubella Antibodies, IGG: 12.1 index (ref >0.99)

## 2020-06-11 LAB — HEPATITIS B SURFACE ANTIGEN: Hepatitis B Surface Ag: NEGATIVE

## 2020-06-11 LAB — RPR: RPR Ser Ql: NONREACTIVE

## 2020-06-13 ENCOUNTER — Encounter: Payer: Self-pay | Admitting: Family Medicine

## 2020-06-13 DIAGNOSIS — O1211 Gestational proteinuria, first trimester: Secondary | ICD-10-CM | POA: Insufficient documentation

## 2020-06-15 ENCOUNTER — Encounter: Payer: Self-pay | Admitting: *Deleted

## 2020-06-16 ENCOUNTER — Encounter: Payer: Self-pay | Admitting: General Practice

## 2020-06-16 ENCOUNTER — Encounter: Payer: Self-pay | Admitting: *Deleted

## 2020-06-21 ENCOUNTER — Other Ambulatory Visit: Payer: Self-pay

## 2020-06-21 ENCOUNTER — Other Ambulatory Visit: Payer: Self-pay | Admitting: *Deleted

## 2020-06-21 DIAGNOSIS — O24119 Pre-existing diabetes mellitus, type 2, in pregnancy, unspecified trimester: Secondary | ICD-10-CM

## 2020-06-24 ENCOUNTER — Encounter: Payer: Self-pay | Admitting: *Deleted

## 2020-06-24 DIAGNOSIS — O0990 Supervision of high risk pregnancy, unspecified, unspecified trimester: Secondary | ICD-10-CM

## 2020-06-27 ENCOUNTER — Encounter: Payer: Self-pay | Admitting: Family Medicine

## 2020-06-29 ENCOUNTER — Encounter: Payer: Self-pay | Admitting: *Deleted

## 2020-07-05 ENCOUNTER — Other Ambulatory Visit: Payer: Self-pay | Admitting: Obstetrics and Gynecology

## 2020-07-05 ENCOUNTER — Other Ambulatory Visit: Payer: Self-pay

## 2020-07-05 ENCOUNTER — Ambulatory Visit (INDEPENDENT_AMBULATORY_CARE_PROVIDER_SITE_OTHER): Payer: Self-pay | Admitting: Obstetrics and Gynecology

## 2020-07-05 ENCOUNTER — Encounter: Payer: Self-pay | Admitting: Obstetrics and Gynecology

## 2020-07-05 VITALS — BP 126/68 | HR 79 | Wt 159.4 lb

## 2020-07-05 DIAGNOSIS — E1142 Type 2 diabetes mellitus with diabetic polyneuropathy: Secondary | ICD-10-CM

## 2020-07-05 DIAGNOSIS — Z794 Long term (current) use of insulin: Secondary | ICD-10-CM

## 2020-07-05 DIAGNOSIS — O24319 Unspecified pre-existing diabetes mellitus in pregnancy, unspecified trimester: Secondary | ICD-10-CM

## 2020-07-05 DIAGNOSIS — O09521 Supervision of elderly multigravida, first trimester: Secondary | ICD-10-CM

## 2020-07-05 DIAGNOSIS — O0990 Supervision of high risk pregnancy, unspecified, unspecified trimester: Secondary | ICD-10-CM

## 2020-07-05 DIAGNOSIS — N179 Acute kidney failure, unspecified: Secondary | ICD-10-CM

## 2020-07-05 DIAGNOSIS — N390 Urinary tract infection, site not specified: Secondary | ICD-10-CM

## 2020-07-05 DIAGNOSIS — O99280 Endocrine, nutritional and metabolic diseases complicating pregnancy, unspecified trimester: Secondary | ICD-10-CM

## 2020-07-05 DIAGNOSIS — Z3A13 13 weeks gestation of pregnancy: Secondary | ICD-10-CM | POA: Insufficient documentation

## 2020-07-05 DIAGNOSIS — E039 Hypothyroidism, unspecified: Secondary | ICD-10-CM

## 2020-07-05 MED ORDER — INSULIN DETEMIR 100 UNIT/ML FLEXPEN: PEN_INJECTOR | SUBCUTANEOUS | 11 refills | Status: DC

## 2020-07-05 MED ORDER — INSULIN ASPART 100 UNIT/ML FLEXPEN
8.0000 [IU] | PEN_INJECTOR | Freq: Three times a day (TID) | SUBCUTANEOUS | 11 refills | Status: DC
  Filled 2020-07-05: qty 9, 38d supply, fill #0
  Filled 2020-07-13: qty 15, 63d supply, fill #0
  Filled 2020-09-29: qty 6, 25d supply, fill #1

## 2020-07-05 MED ORDER — INSULIN DETEMIR 100 UNIT/ML FLEXPEN
PEN_INJECTOR | SUBCUTANEOUS | 11 refills | Status: DC
  Filled 2020-07-05 – 2020-07-13 (×2): qty 18, 29d supply, fill #0
  Filled 2020-08-21: qty 18, 29d supply, fill #1
  Filled 2020-09-29: qty 18, 29d supply, fill #2
  Filled 2020-11-01: qty 18, 29d supply, fill #3

## 2020-07-05 MED ORDER — PRENATAL PLUS 27-1 MG PO TABS
1.0000 | ORAL_TABLET | Freq: Every day | ORAL | 11 refills | Status: DC
  Filled 2020-07-05: qty 30, 30d supply, fill #0

## 2020-07-05 MED ORDER — INSULIN DETEMIR 100 UNIT/ML ~~LOC~~ SOLN: 52.0000 [IU] | Freq: Every day | SUBCUTANEOUS | 11 refills | Status: DC

## 2020-07-05 NOTE — Progress Notes (Signed)
PRENATAL VISIT NOTE  Subjective:  Audrey Mcmillan is a 40 y.o. G2P1001 at [redacted]w[redacted]d being seen today for ongoing prenatal care.  She is currently monitored for the following issues for this high-risk pregnancy and has Elevated lipoprotein(a); Acquired hypothyroidism; Type 2 diabetes mellitus with diabetic polyneuropathy, with long-term current use of insulin ([place]); AKI (acute kidney injury) ([place]); Acute lower UTI; Flu-like symptoms; Supervision of high risk pregnancy, antepartum; AMA (advanced maternal age) multigravida 35+; Preexisting diabetes complicating pregnancy, antepartum; Language barrier; Kidney stone complicating pregnancy, first trimester; Proteinuria affecting pregnancy in first trimester; and [redacted] weeks gestation of pregnancy on their problem list.  Patient reports no complaints. Pt reports good adherence with insulin and synthroid. Checking blood glucose levels four times daily (2 hours after meals). Needs refill of insulin today. Contractions: Not present. Vag. Bleeding: None.  Movement: Absent. Denies leaking of fluid.  The following portions of the patient's history were reviewed and updated as appropriate: allergies, current medications, past family history, past medical history, past social history, past surgical history and problem list.   Objective:   Vitals:   07/05/20 1044  BP: 126/68  Pulse: 79  Weight: 159 lb 6.4 oz (72.3 kg)    Fetal Status: Fetal Heart Rate (bpm): 160   Movement: Absent     General:  Alert, oriented and cooperative. Patient is in no acute distress.  Skin: Skin is warm and dry. No rash noted.   Cardiovascular: Normal heart rate noted  Respiratory: Normal respiratory effort, no problems with respiration noted  Abdomen: Soft, gravid, appropriate for gestational age.  Pain/Pressure: Absent     Pelvic: Cervical exam deferred        Extremities: Normal range of motion.  Edema: None  Mental Status: Normal mood and affect. Normal behavior. Normal  judgment and thought content.   Assessment and Plan:  Pregnancy: G2P1001 at 102w4d, dated by 10 week [place].  1. Supervision of high risk pregnancy, antepartum  [redacted] weeks gestation of pregnancy: No concerns today. No red flag symptoms. Blood pressure and FHTs wnl. Pt taking medications with good adherence. Genetic testing results normal. - prenatal vitamin w/FE, FA (PRENATAL 1 + 1) 27-1 MG TABS tablet; Take 1 tablet by mouth daily at 12 noon.  Dispense: 30 tablet; Refill: 11 - plan [facility] pap at [facility] given insurance status - MFM and Anatomy Scan scheduled for 08/12/20 - RTC in 4 weeks for f/u prenatal appt  2. Type 2 diabetes mellitus with diabetic polyneuropathy, with long-term current use of insulin ([place]): A1c 10.4% during recent hospital admission. Pt reports good adherence to insulin regimen (levemir 10u in AM, 50u qHS; Novolog 8u TID AC). Brought BG log today, consistently checking QID BG levels (2hr after meals). Majority of BG levels at goal after breakfast and lunch; however, majority of fasting BG levels in high 90s range without lows. BG checks after dinner are also intermittently elevated based on whether or not pt has fruit at dinner. - provided new BG logging sheets today; praised pt for good adherence to plan - continue levemir 10u in AM & increased to levemir 52u qHS (refills of pens, needles provided) - discussed cutting out fruit at dinner to keep postprandial BG level at goal - instructed pt to increase mealtime Novolog at dinner to 10u if postprandial still elevated - will need ophthalmology and fetal echo ordered at next appt - plan for growth scans, antenatal testing starting at [place], and IOL by 39w  3. Acquired Hypothyroidism: Pt reports good  adherence to synthroid 174mcg daily. - f/u repeat TFTs today  4. AKI (acute kidney injury) [facility]): Pt recently hospitalized for pyelonephritis and AKI. Reassuringly, Cr down-trending to 0.96 at last office visit. No urinary symptoms. Continue  to monitor.   5. Acute lower UTI: S/p keflex course. Denies urinary symptoms today. - emphasized importance of good hydration - f/u TOC Ucx today  6. Multigravida of advanced maternal age in first trimester  7. Language barrier: Spanish  Preterm labor symptoms and general obstetric precautions including but not limited to vaginal bleeding, contractions, leaking of fluid and fetal movement were reviewed in detail with the patient. Please refer to After Visit Summary for other counseling recommendations.   Return in about 4 weeks (around 08/02/2020) for f/u prenatal visit in person with MD only.  Future Appointments  Date Time Provider [place]  08/02/2020  9:15 AM Donnamae Jude, MD [facility]  08/12/2020  8:15 AM [facility] NURSE [facility]  08/12/2020  8:30 AM [facility] US3 [facility]    Randa Ngo, MD OB Fellow, Faculty Practice 07/07/2020 5:00 PM

## 2020-07-06 ENCOUNTER — Telehealth: Payer: Self-pay

## 2020-07-06 LAB — PROTEIN / CREATININE RATIO, URINE
Creatinine, Urine: 39.7 mg/dL
Protein, Ur: 13.1 mg/dL
Protein/Creat Ratio: 330 mg/g creat — ABNORMAL HIGH (ref 0–200)

## 2020-07-06 LAB — THYROID PANEL WITH TSH
Free Thyroxine Index: 2.2 (ref 1.2–4.9)
T3 Uptake Ratio: 19 % — ABNORMAL LOW (ref 24–39)
T4, Total: 11.4 ug/dL (ref 4.5–12.0)
TSH: 1.05 u[IU]/mL (ref 0.450–4.500)

## 2020-07-06 NOTE — Telephone Encounter (Signed)
Mailed patient her [place] with a new patient letter and map.

## 2020-07-07 ENCOUNTER — Telehealth: Payer: Self-pay | Admitting: Obstetrics and Gynecology

## 2020-07-07 MED ORDER — LEVOTHYROXINE SODIUM 175 MCG PO TABS
175.0000 ug | ORAL_TABLET | Freq: Every day | ORAL | 1 refills | Status: DC
  Filled 2020-07-07: qty 30, 30d supply, fill #0
  Filled 2020-08-11: qty 30, 30d supply, fill #1
  Filled 2020-09-15: qty 30, 30d supply, fill #2
  Filled 2020-10-20: qty 30, 30d supply, fill #3

## 2020-07-07 MED ORDER — "INSULIN SYRINGE-NEEDLE U-100 30G X 5/16"" 1 ML MISC"
1.0000 "application " | Freq: Two times a day (BID) | 5 refills | Status: DC
  Filled 2020-07-07: qty 100, 30d supply, fill #0

## 2020-07-08 ENCOUNTER — Other Ambulatory Visit: Payer: Self-pay

## 2020-07-08 LAB — URINE CULTURE, OB REFLEX

## 2020-07-08 LAB — CULTURE, OB URINE

## 2020-07-08 NOTE — Telephone Encounter (Signed)
Called pt to discuss normal TFT labs from recent clinic visit. Sent refill for current dose of synthroid. No other questions or concerns at this time.  Randa Ngo, MD OB Fellow, Faculty Practice 07/08/2020 10:55 AM

## 2020-07-12 ENCOUNTER — Telehealth: Payer: Self-pay | Admitting: Obstetrics and Gynecology

## 2020-07-12 ENCOUNTER — Telehealth: Payer: Self-pay

## 2020-07-12 ENCOUNTER — Other Ambulatory Visit: Payer: Self-pay

## 2020-07-12 DIAGNOSIS — O99891 Other specified diseases and conditions complicating pregnancy: Secondary | ICD-10-CM

## 2020-07-12 DIAGNOSIS — R8271 Bacteriuria: Secondary | ICD-10-CM

## 2020-07-12 MED ORDER — CEFDINIR 300 MG PO CAPS
300.0000 mg | ORAL_CAPSULE | Freq: Two times a day (BID) | ORAL | 0 refills | Status: DC
  Filled 2020-07-12: qty 10, 5d supply, fill #0

## 2020-07-12 NOTE — Telephone Encounter (Signed)
Attempted to call pt regarding recent Urine culture result (obtained as TOC s/p recent hospitalization of pyelo). At time of urine collection in clinic on 6/14, pt was asymptomatic and had completed her prior course of antibiotics (keflex). Discuss choice of antibiotic with OB pharmacist; recommendation for cefdinir. Script sent to pt's preferred pharmacy. Will send message to clinical pool to try to get in contact with pt later today.  Randa Ngo, MD OB Fellow, Faculty Practice 07/12/2020 9:34 AM

## 2020-07-12 NOTE — Telephone Encounter (Addendum)
-----   Message from Randa Ngo, MD sent at 07/12/2020  9:34 AM EDT ----- Regarding: Please call pt re: need to start antibiotics for +UCx in pregnancy (needs Spanish interpreter) Please call this pt to discuss need to start additional course of antibiotics given repeat urine culture after prior infection was still positive. (I attempted to call her and was unable to get in contact). I have sent a new prescription to her preferred pharmacy. Please confirm that she is still taking her insulin as prescribed and checking her BG levels 4x daily.  Thanks!   Called pt with interpreter Raquel. Result given and medication administration instructions reviewed with pt. Pt agrees to pick up antibiotic. Pt able to review insulin admin schedule correctly; taking as prescribed. Also, reports checking blood glucose 5 x daily. Per chart review, pt only needs fasting AM and 2 hour PP BG checks. Explained to pt she does not need to continue checking prior to bed. Pt verbalizes understanding and will follow up at next provider appt.

## 2020-07-13 ENCOUNTER — Other Ambulatory Visit: Payer: Self-pay

## 2020-08-02 ENCOUNTER — Encounter: Payer: Self-pay | Admitting: Family Medicine

## 2020-08-02 NOTE — Progress Notes (Signed)
Patient did not keep appointment today. She will be called to reschedule.  

## 2020-08-12 ENCOUNTER — Ambulatory Visit (HOSPITAL_BASED_OUTPATIENT_CLINIC_OR_DEPARTMENT_OTHER): Payer: Self-pay | Admitting: Maternal & Fetal Medicine

## 2020-08-12 ENCOUNTER — Ambulatory Visit: Payer: Self-pay | Admitting: *Deleted

## 2020-08-12 ENCOUNTER — Ambulatory Visit: Payer: Self-pay | Attending: Family Medicine

## 2020-08-12 ENCOUNTER — Other Ambulatory Visit: Payer: Self-pay

## 2020-08-12 ENCOUNTER — Other Ambulatory Visit: Payer: Self-pay | Admitting: *Deleted

## 2020-08-12 ENCOUNTER — Encounter: Payer: Self-pay | Admitting: *Deleted

## 2020-08-12 VITALS — BP 133/65 | HR 78

## 2020-08-12 DIAGNOSIS — O43192 Other malformation of placenta, second trimester: Secondary | ICD-10-CM | POA: Insufficient documentation

## 2020-08-12 DIAGNOSIS — E039 Hypothyroidism, unspecified: Secondary | ICD-10-CM

## 2020-08-12 DIAGNOSIS — O99282 Endocrine, nutritional and metabolic diseases complicating pregnancy, second trimester: Secondary | ICD-10-CM | POA: Insufficient documentation

## 2020-08-12 DIAGNOSIS — O24112 Pre-existing diabetes mellitus, type 2, in pregnancy, second trimester: Secondary | ICD-10-CM

## 2020-08-12 DIAGNOSIS — O0990 Supervision of high risk pregnancy, unspecified, unspecified trimester: Secondary | ICD-10-CM | POA: Insufficient documentation

## 2020-08-12 NOTE — Progress Notes (Signed)
MFM Brief Note  Ms. Vickey Huger is a 40 yo G2P1 who is her for a detailed examination due to type 2 diabetes, today we observed a marginal cord insertion  A single intrauterine pregnancy here for a detailed anatomy  Normal anatomy with measurements consistent with dates There is good fetal movement and amniotic fluid volume Suboptimal views of the fetal anatomy were obtained secondary to fetal position.  I reviewed today's study and recommend serial growth, fetal echocardiogram and initiation of weekly testing at 32 weeks. We discussed the increased risk for fetal macrosomia, cardiac defects, maternal and fetal birth trauma with temporary and/or permenant damage, stillbirth and neonatal ICU admission.   Ms. Verneita Griffes notes that she is taking insulin and that her blood sugars have been normal reporting FBS in 87  Marginal cord insertion is associated with fetal growth delays. Therefore we recommend serial growth exams.   In addition we discussed starting daily low dose ASA for the prevention of preeclampsia.   Lastly, Ms Verneita Griffes has a known history of hypothyroidism her most recent TSH was normal 4 weeks ago. She takes 175 mcg daily.  Continue checking TSH every trimester if medication is changed consider repeat TSH in 4 weeks.   Recommendations:  Follow up growth in 4 weeks Fetal echocardiogram referral sent.  I spent 30 minutes with > 50% in face to face consultation  Vikki Ports, MD

## 2020-08-14 ENCOUNTER — Encounter: Payer: Self-pay | Admitting: Family Medicine

## 2020-08-14 DIAGNOSIS — O43199 Other malformation of placenta, unspecified trimester: Secondary | ICD-10-CM

## 2020-08-14 HISTORY — DX: Other malformation of placenta, unspecified trimester: O43.199

## 2020-08-17 ENCOUNTER — Other Ambulatory Visit: Payer: Self-pay

## 2020-08-22 ENCOUNTER — Other Ambulatory Visit: Payer: Self-pay

## 2020-09-09 ENCOUNTER — Other Ambulatory Visit: Payer: Self-pay

## 2020-09-09 ENCOUNTER — Ambulatory Visit: Payer: Self-pay | Attending: Maternal & Fetal Medicine

## 2020-09-09 ENCOUNTER — Ambulatory Visit: Payer: Self-pay | Admitting: *Deleted

## 2020-09-09 ENCOUNTER — Encounter: Payer: Self-pay | Admitting: *Deleted

## 2020-09-09 ENCOUNTER — Other Ambulatory Visit: Payer: Self-pay | Admitting: *Deleted

## 2020-09-09 VITALS — BP 128/54 | HR 74

## 2020-09-09 DIAGNOSIS — Z794 Long term (current) use of insulin: Secondary | ICD-10-CM

## 2020-09-09 DIAGNOSIS — E119 Type 2 diabetes mellitus without complications: Secondary | ICD-10-CM

## 2020-09-09 DIAGNOSIS — E039 Hypothyroidism, unspecified: Secondary | ICD-10-CM

## 2020-09-09 DIAGNOSIS — O09522 Supervision of elderly multigravida, second trimester: Secondary | ICD-10-CM

## 2020-09-09 DIAGNOSIS — O24112 Pre-existing diabetes mellitus, type 2, in pregnancy, second trimester: Secondary | ICD-10-CM

## 2020-09-09 DIAGNOSIS — O43199 Other malformation of placenta, unspecified trimester: Secondary | ICD-10-CM

## 2020-09-09 DIAGNOSIS — O365990 Maternal care for other known or suspected poor fetal growth, unspecified trimester, not applicable or unspecified: Secondary | ICD-10-CM

## 2020-09-09 DIAGNOSIS — Z3A23 23 weeks gestation of pregnancy: Secondary | ICD-10-CM

## 2020-09-09 DIAGNOSIS — O99282 Endocrine, nutritional and metabolic diseases complicating pregnancy, second trimester: Secondary | ICD-10-CM

## 2020-09-09 DIAGNOSIS — Z363 Encounter for antenatal screening for malformations: Secondary | ICD-10-CM

## 2020-09-16 ENCOUNTER — Other Ambulatory Visit: Payer: Self-pay

## 2020-09-27 ENCOUNTER — Telehealth: Payer: Self-pay

## 2020-09-27 NOTE — Telephone Encounter (Signed)
FETAL ECHO NOT SCHEDULED YET-[facility]IL[facility] UNABLE TO REACH PATIENT

## 2020-09-30 ENCOUNTER — Other Ambulatory Visit: Payer: Self-pay

## 2020-10-03 ENCOUNTER — Ambulatory Visit: Payer: Self-pay | Attending: Obstetrics

## 2020-10-03 ENCOUNTER — Other Ambulatory Visit: Payer: Self-pay

## 2020-10-03 ENCOUNTER — Ambulatory Visit (HOSPITAL_BASED_OUTPATIENT_CLINIC_OR_DEPARTMENT_OTHER): Payer: Self-pay | Admitting: Maternal & Fetal Medicine

## 2020-10-03 ENCOUNTER — Other Ambulatory Visit: Payer: Self-pay | Admitting: *Deleted

## 2020-10-03 ENCOUNTER — Ambulatory Visit: Payer: Self-pay | Admitting: *Deleted

## 2020-10-03 VITALS — BP 138/74 | HR 77

## 2020-10-03 DIAGNOSIS — O43193 Other malformation of placenta, third trimester: Secondary | ICD-10-CM | POA: Insufficient documentation

## 2020-10-03 DIAGNOSIS — O09522 Supervision of elderly multigravida, second trimester: Secondary | ICD-10-CM

## 2020-10-03 DIAGNOSIS — E119 Type 2 diabetes mellitus without complications: Secondary | ICD-10-CM

## 2020-10-03 DIAGNOSIS — O24113 Pre-existing diabetes mellitus, type 2, in pregnancy, third trimester: Secondary | ICD-10-CM | POA: Insufficient documentation

## 2020-10-03 DIAGNOSIS — O24112 Pre-existing diabetes mellitus, type 2, in pregnancy, second trimester: Secondary | ICD-10-CM

## 2020-10-03 DIAGNOSIS — O24119 Pre-existing diabetes mellitus, type 2, in pregnancy, unspecified trimester: Secondary | ICD-10-CM | POA: Insufficient documentation

## 2020-10-03 DIAGNOSIS — O365990 Maternal care for other known or suspected poor fetal growth, unspecified trimester, not applicable or unspecified: Secondary | ICD-10-CM

## 2020-10-03 DIAGNOSIS — O365920 Maternal care for other known or suspected poor fetal growth, second trimester, not applicable or unspecified: Secondary | ICD-10-CM

## 2020-10-03 DIAGNOSIS — E039 Hypothyroidism, unspecified: Secondary | ICD-10-CM

## 2020-10-03 DIAGNOSIS — O99283 Endocrine, nutritional and metabolic diseases complicating pregnancy, third trimester: Secondary | ICD-10-CM

## 2020-10-03 DIAGNOSIS — Z3A26 26 weeks gestation of pregnancy: Secondary | ICD-10-CM

## 2020-10-03 DIAGNOSIS — O43199 Other malformation of placenta, unspecified trimester: Secondary | ICD-10-CM

## 2020-10-03 DIAGNOSIS — E079 Disorder of thyroid, unspecified: Secondary | ICD-10-CM

## 2020-10-03 DIAGNOSIS — O43192 Other malformation of placenta, second trimester: Secondary | ICD-10-CM

## 2020-10-03 NOTE — Progress Notes (Signed)
MFM Brief Note  Follow up growth with known T2 DM, hypothyroidism and AMA with known marginal cord insertion. Positive  interval growth with measurements consistent with dates Good fetal movement and amniotic fluid volume   She continues to take a her synthroid 175 mcg, consider repeating labs, last result was in June 2022.  She continue on taking her insulin and glucose monitoring however, she didn't have her log and she reports that her blood sugars are in the 150's in the morning at times  Lastly, the fetal echocardiogram referral was sent but they have had a difficult time reaching Ms. Verneita Griffes. I reminded her of the importance of this study.  She is going to have our schedulers assist with getting this appointment booked today.  Recommendations;  Repeat growth in 5 weeks with BPP Initiate weekly testing at 32 weeks.   I spent 20 minutes with > 50% in face to face consultation.  Vikki Ports, MD

## 2020-10-21 ENCOUNTER — Other Ambulatory Visit: Payer: Self-pay

## 2020-11-02 ENCOUNTER — Other Ambulatory Visit: Payer: Self-pay

## 2020-11-07 ENCOUNTER — Other Ambulatory Visit: Payer: Self-pay | Admitting: *Deleted

## 2020-11-07 ENCOUNTER — Other Ambulatory Visit: Payer: Self-pay

## 2020-11-07 ENCOUNTER — Ambulatory Visit: Payer: Self-pay | Attending: Maternal & Fetal Medicine

## 2020-11-07 ENCOUNTER — Ambulatory Visit: Payer: Self-pay | Admitting: *Deleted

## 2020-11-07 ENCOUNTER — Encounter: Payer: Self-pay | Admitting: *Deleted

## 2020-11-07 VITALS — BP 145/76 | HR 78

## 2020-11-07 DIAGNOSIS — O99283 Endocrine, nutritional and metabolic diseases complicating pregnancy, third trimester: Secondary | ICD-10-CM

## 2020-11-07 DIAGNOSIS — O24113 Pre-existing diabetes mellitus, type 2, in pregnancy, third trimester: Secondary | ICD-10-CM | POA: Insufficient documentation

## 2020-11-07 DIAGNOSIS — O43193 Other malformation of placenta, third trimester: Secondary | ICD-10-CM

## 2020-11-07 DIAGNOSIS — O43192 Other malformation of placenta, second trimester: Secondary | ICD-10-CM | POA: Insufficient documentation

## 2020-11-07 DIAGNOSIS — O99280 Endocrine, nutritional and metabolic diseases complicating pregnancy, unspecified trimester: Secondary | ICD-10-CM

## 2020-11-07 DIAGNOSIS — O24112 Pre-existing diabetes mellitus, type 2, in pregnancy, second trimester: Secondary | ICD-10-CM | POA: Insufficient documentation

## 2020-11-07 DIAGNOSIS — O09523 Supervision of elderly multigravida, third trimester: Secondary | ICD-10-CM

## 2020-11-07 DIAGNOSIS — E039 Hypothyroidism, unspecified: Secondary | ICD-10-CM

## 2020-11-07 DIAGNOSIS — O09522 Supervision of elderly multigravida, second trimester: Secondary | ICD-10-CM | POA: Insufficient documentation

## 2020-11-07 DIAGNOSIS — E079 Disorder of thyroid, unspecified: Secondary | ICD-10-CM | POA: Insufficient documentation

## 2020-11-07 DIAGNOSIS — Z3A31 31 weeks gestation of pregnancy: Secondary | ICD-10-CM

## 2020-11-14 ENCOUNTER — Ambulatory Visit (INDEPENDENT_AMBULATORY_CARE_PROVIDER_SITE_OTHER): Payer: Self-pay | Admitting: Obstetrics & Gynecology

## 2020-11-14 ENCOUNTER — Other Ambulatory Visit: Payer: Self-pay

## 2020-11-14 ENCOUNTER — Ambulatory Visit (INDEPENDENT_AMBULATORY_CARE_PROVIDER_SITE_OTHER): Payer: Self-pay

## 2020-11-14 ENCOUNTER — Ambulatory Visit: Payer: Self-pay | Admitting: *Deleted

## 2020-11-14 VITALS — BP 150/81 | HR 70 | Wt 173.2 lb

## 2020-11-14 DIAGNOSIS — O133 Gestational [pregnancy-induced] hypertension without significant proteinuria, third trimester: Secondary | ICD-10-CM

## 2020-11-14 DIAGNOSIS — O24319 Unspecified pre-existing diabetes mellitus in pregnancy, unspecified trimester: Secondary | ICD-10-CM

## 2020-11-14 DIAGNOSIS — Z5941 Food insecurity: Secondary | ICD-10-CM

## 2020-11-14 DIAGNOSIS — O0990 Supervision of high risk pregnancy, unspecified, unspecified trimester: Secondary | ICD-10-CM

## 2020-11-14 DIAGNOSIS — O09523 Supervision of elderly multigravida, third trimester: Secondary | ICD-10-CM

## 2020-11-14 HISTORY — DX: Gestational (pregnancy-induced) hypertension without significant proteinuria, third trimester: O13.3

## 2020-11-14 NOTE — Progress Notes (Signed)
Pt reports H/A today - pain scale -5. She denies visual disturbances but states her eyes feel "hot".  Next [place] for growth scheduled on 11/9.

## 2020-11-14 NOTE — Progress Notes (Signed)
   PRENATAL VISIT NOTE  Subjective:  Audrey Mcmillan is a 40 y.o. G2P1001 at [redacted]w[redacted]d being seen today for ongoing prenatal care.  She is currently monitored for the following issues for this high-risk pregnancy and has Elevated lipoprotein(a); Acquired hypothyroidism; Type 2 diabetes mellitus with diabetic polyneuropathy, with long-term current use of insulin ([place]); AKI (acute kidney injury) ([place]); Acute lower UTI; Supervision of high risk pregnancy, antepartum; AMA (advanced maternal age) multigravida 35+; Preexisting diabetes complicating pregnancy, antepartum; Language barrier; Kidney stone complicating pregnancy, first trimester; Proteinuria affecting pregnancy in first trimester; Marginal insertion of umbilical cord affecting management of mother; and Gestational hypertension, third trimester on their problem list.  Patient reports headache and mild .  Contractions: Not present. Vag. Bleeding: None.  Movement: Present. Denies leaking of fluid.   The following portions of the patient's history were reviewed and updated as appropriate: allergies, current medications, past family history, past medical history, past social history, past surgical history and problem list.   Objective:   Vitals:   11/14/20 1348 11/14/20 1407  BP: (!) 154/76 (!) 150/81  Pulse: 70   Weight: 173 lb 3.2 oz (78.6 kg)     Fetal Status: Fetal Heart Rate (bpm): RNST   Movement: Present     General:  Alert, oriented and cooperative. Patient is in no acute distress.  Skin: Skin is warm and dry. No rash noted.   Cardiovascular: Normal heart rate noted  Respiratory: Normal respiratory effort, no problems with respiration noted  Abdomen: Soft, gravid, appropriate for gestational age.  Pain/Pressure: Absent     Pelvic: Cervical exam deferred        Extremities: Normal range of motion.     Mental Status: Normal mood and affect. Normal behavior. Normal judgment and thought content.   Assessment and Plan:   Pregnancy: G2P1001 at [redacted]w[redacted]d 1. Supervision of high risk pregnancy, antepartum GHTN vs preeclampsia - Comp Met (CMET) - CBC - Protein / creatinine ratio, urine  2. Preexisting diabetes complicating pregnancy, antepartum FBS below 98 and PP less than 120  3. Multigravida of advanced maternal age in third trimester   4. Food insecurity   5. Gestational hypertension, third trimester BP cuff  at home and RTC 2-3 days  Preterm labor symptoms and general obstetric precautions including but not limited to vaginal bleeding, contractions, leaking of fluid and fetal movement were reviewed in detail with the patient. Please refer to After Visit Summary for other counseling recommendations.   Return in about 1 week (around 11/21/2020) for NST/BPP only & 11/7 HOB as scheduled.  Future Appointments  Date Time Provider [place]  11/21/2020  1:15 PM [facility] NST [facility]  11/28/2020  9:15 AM Woodroe Mode, MD [facility]  11/30/2020  9:45 AM [facility] NURSE [facility]  11/30/2020 10:00 AM [facility] US1 [facility]  12/05/2020 10:15 AM [facility] NST [facility]    Emeterio Reeve, MD

## 2020-11-15 LAB — CBC
Hematocrit: 35.4 % (ref 34.0–46.6)
Hemoglobin: 11.7 g/dL (ref 11.1–15.9)
MCH: 30.5 pg (ref 26.6–33.0)
MCHC: 33.1 g/dL (ref 31.5–35.7)
MCV: 92 fL (ref 79–97)
Platelets: 228 10*3/uL (ref 150–450)
RBC: 3.84 x10E6/uL (ref 3.77–5.28)
RDW: 12.6 % (ref 11.7–15.4)
WBC: 10.4 10*3/uL (ref 3.4–10.8)

## 2020-11-15 LAB — PROTEIN / CREATININE RATIO, URINE
Creatinine, Urine: 71.7 mg/dL
Protein, Ur: 92.7 mg/dL
Protein/Creat Ratio: 1293 mg/g creat — ABNORMAL HIGH (ref 0–200)

## 2020-11-15 LAB — COMPREHENSIVE METABOLIC PANEL
ALT: 17 IU/L (ref 0–32)
AST: 16 IU/L (ref 0–40)
Albumin/Globulin Ratio: 1.1 — ABNORMAL LOW (ref 1.2–2.2)
Albumin: 3.5 g/dL — ABNORMAL LOW (ref 3.8–4.8)
Alkaline Phosphatase: 189 IU/L — ABNORMAL HIGH (ref 44–121)
BUN/Creatinine Ratio: 14 (ref 9–23)
BUN: 14 mg/dL (ref 6–20)
Bilirubin Total: 0.3 mg/dL (ref 0.0–1.2)
CO2: 18 mmol/L — ABNORMAL LOW (ref 20–29)
Calcium: 9.2 mg/dL (ref 8.7–10.2)
Chloride: 110 mmol/L — ABNORMAL HIGH (ref 96–106)
Creatinine, Ser: 1.01 mg/dL — ABNORMAL HIGH (ref 0.57–1.00)
Globulin, Total: 3.1 g/dL (ref 1.5–4.5)
Glucose: 51 mg/dL — ABNORMAL LOW (ref 70–99)
Potassium: 4.6 mmol/L (ref 3.5–5.2)
Sodium: 139 mmol/L (ref 134–144)
Total Protein: 6.6 g/dL (ref 6.0–8.5)
eGFR: 73 mL/min/{1.73_m2} (ref >59)

## 2020-11-16 ENCOUNTER — Other Ambulatory Visit: Payer: Self-pay

## 2020-11-16 ENCOUNTER — Ambulatory Visit (INDEPENDENT_AMBULATORY_CARE_PROVIDER_SITE_OTHER): Payer: Self-pay | Admitting: Pharmacist

## 2020-11-16 VITALS — BP 149/85

## 2020-11-16 DIAGNOSIS — Z0130 Encounter for examination of blood pressure without abnormal findings: Secondary | ICD-10-CM

## 2020-11-16 DIAGNOSIS — O133 Gestational [pregnancy-induced] hypertension without significant proteinuria, third trimester: Secondary | ICD-10-CM

## 2020-11-16 NOTE — Progress Notes (Signed)
[name] is 37w6dtoday here for a BP check. BP at recent OB visit two days ago were elevated 154/76 mmHg and repeat at 150/81 mmHg. Patient did report headache at that visit but denies headache today. BP today is 154/88 mmHg with a repeat ~20 minutes later 149/85 mmHg. Upon chart review, patient appears to have a history of elevated blood pressures throughout pregnancy as well as prior to pregnancy so likely chronic hypertension.  Patient's labs from 11/14/20 include an elevated protein:creatinine ratio with a slightly elevated Scr but normal LFTs and PLT.  Recent Results (from the past 2160 hour(s))  Protein / creatinine ratio, urine     Status: Abnormal   Collection Time: 11/14/20  2:16 PM  Result Value Ref Range   Creatinine, Urine 71.7 Not Estab. mg/dL   Protein, Ur 92.7 Not Estab. mg/dL   Protein/Creat Ratio 1,293 (H) 0 - 200 mg/g creat  Comp Met (CMET)     Status: Abnormal   Collection Time: 11/14/20  2:24 PM  Result Value Ref Range   Glucose 51 (L) 70 - 99 mg/dL   BUN 14 6 - 20 mg/dL   Creatinine, Ser 1.01 (H) 0.57 - 1.00 mg/dL   eGFR 73 >59 mL/min/1.73   BUN/Creatinine Ratio 14 9 - 23   Sodium 139 134 - 144 mmol/L   Potassium 4.6 3.5 - 5.2 mmol/L   Chloride 110 (H) 96 - 106 mmol/L   CO2 18 (L) 20 - 29 mmol/L   Calcium 9.2 8.7 - 10.2 mg/dL   Total Protein 6.6 6.0 - 8.5 g/dL   Albumin 3.5 (L) 3.8 - 4.8 g/dL   Globulin, Total 3.1 1.5 - 4.5 g/dL   Albumin/Globulin Ratio 1.1 (L) 1.2 - 2.2   Bilirubin Total 0.3 0.0 - 1.2 mg/dL   Alkaline Phosphatase 189 (H) 44 - 121 IU/L   AST 16 0 - 40 IU/L   ALT 17 0 - 32 IU/L  CBC     Status: None   Collection Time: 11/14/20  2:24 PM  Result Value Ref Range   WBC 10.4 3.4 - 10.8 x10E3/uL   RBC 3.84 3.77 - 5.28 x10E6/uL   Hemoglobin 11.7 11.1 - 15.9 g/dL   Hematocrit 35.4 34.0 - 46.6 %   MCV 92 79 - 97 fL   MCH 30.5 26.6 - 33.0 pg   MCHC 33.1 31.5 - 35.7 g/dL   RDW 12.6 11.7 - 15.4 %   Platelets 228 150 - 450 x10E3/uL      Spoke with Dr. ARoselie Awkwardas patient is presenting with superimposed preeclampsia without severe features. Would like patient to have follow-up visit with provider next Monday (10/31). Educated patient to monitor BP and if increases or experiences any further symptoms (I.e. headache) to call provider office for closer follow-up.   Thank you for allowing pharmacy to be a part of this patient's care.  VLolita Rieger PharmD, BCPPS

## 2020-11-21 ENCOUNTER — Other Ambulatory Visit: Payer: Self-pay

## 2020-11-21 ENCOUNTER — Ambulatory Visit (INDEPENDENT_AMBULATORY_CARE_PROVIDER_SITE_OTHER): Payer: Self-pay | Admitting: General Practice

## 2020-11-21 ENCOUNTER — Ambulatory Visit (INDEPENDENT_AMBULATORY_CARE_PROVIDER_SITE_OTHER): Payer: Self-pay

## 2020-11-21 ENCOUNTER — Ambulatory Visit (INDEPENDENT_AMBULATORY_CARE_PROVIDER_SITE_OTHER): Payer: Self-pay | Admitting: Obstetrics and Gynecology

## 2020-11-21 ENCOUNTER — Encounter: Payer: Self-pay | Admitting: Obstetrics and Gynecology

## 2020-11-21 VITALS — BP 162/86 | HR 79

## 2020-11-21 VITALS — BP 140/83 | HR 84 | Wt 177.0 lb

## 2020-11-21 DIAGNOSIS — E039 Hypothyroidism, unspecified: Secondary | ICD-10-CM

## 2020-11-21 DIAGNOSIS — O24319 Unspecified pre-existing diabetes mellitus in pregnancy, unspecified trimester: Secondary | ICD-10-CM

## 2020-11-21 DIAGNOSIS — Z3A33 33 weeks gestation of pregnancy: Secondary | ICD-10-CM

## 2020-11-21 DIAGNOSIS — O09523 Supervision of elderly multigravida, third trimester: Secondary | ICD-10-CM

## 2020-11-21 DIAGNOSIS — Z789 Other specified health status: Secondary | ICD-10-CM

## 2020-11-21 DIAGNOSIS — O99280 Endocrine, nutritional and metabolic diseases complicating pregnancy, unspecified trimester: Secondary | ICD-10-CM

## 2020-11-21 DIAGNOSIS — O0990 Supervision of high risk pregnancy, unspecified, unspecified trimester: Secondary | ICD-10-CM

## 2020-11-21 DIAGNOSIS — O133 Gestational [pregnancy-induced] hypertension without significant proteinuria, third trimester: Secondary | ICD-10-CM

## 2020-11-21 MED ORDER — LEVOTHYROXINE SODIUM 175 MCG PO TABS
175.0000 ug | ORAL_TABLET | Freq: Every day | ORAL | 5 refills | Status: DC
Start: 2020-11-21 — End: 2021-08-28
  Filled 2020-11-21: qty 30, 30d supply, fill #0
  Filled 2020-12-29: qty 30, 30d supply, fill #1
  Filled 2021-02-06: qty 30, 30d supply, fill #2
  Filled 2021-02-07: qty 30, 30d supply, fill #0
  Filled 2021-03-07: qty 30, 30d supply, fill #1
  Filled 2021-04-30 – 2021-06-23 (×2): qty 30, 30d supply, fill #2
  Filled 2021-07-20: qty 30, 30d supply, fill #3

## 2020-11-21 NOTE — Progress Notes (Signed)
Patient reports some headaches last week- denies dizziness or blurry vision

## 2020-11-21 NOTE — Progress Notes (Signed)
Subjective:  Audrey Mcmillan is a 40 y.o. G2P1001 at 33w4dbeing seen today for ongoing prenatal care.  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  Patient reports occ HA but denies today. Denies visual changes.  Contractions: Not present. Vag. Bleeding: None.  Movement: Present. Denies leaking of fluid.   The following portions of the patient's history were reviewed and updated as appropriate: allergies, current medications, past family history, past medical history, past social history, past surgical history and problem list. Problem list updated.  Objective:   Vitals:   11/21/20 1420 11/21/20 1423 11/21/20 1617  BP: (!) 172/87 (!) 168/85 (!) 162/86  Pulse: 79      Fetal Status: Fetal Heart Rate (bpm): NST   Movement: Present     General:  Alert, oriented and cooperative. Patient is in no acute distress.  Skin: Skin is warm and dry. No rash noted.   Cardiovascular: Normal heart rate noted  Respiratory: Normal respiratory effort, no problems with respiration noted  Abdomen: Soft, gravid, appropriate for gestational age. Pain/Pressure: Absent     Pelvic:  Cervical exam deferred        Extremities: Normal range of motion.  Edema: None  Mental Status: Normal mood and affect. Normal behavior. Normal judgment and thought content.   Urinalysis:      Assessment and Plan:  Pregnancy:  G2P1001 at 375w4d1. Supervision of high risk pregnancy, antepartum Stable  2. Preexisting diabetes complicating pregnancy, antepartum Did not bring CBG readings but reports in goal range. Will continue with antenatal testing and serial growth scans Growth 16 % on 11/07/20 BPP 10/10 today Continue with current insulin regiment  3. Gestational hypertension, third trimester Will check labs today S/Sx of PEC reviewed with pt. Continue to monitor BP at home, pt reports BP at 150's/70's - CBC - Comp Met (CMET) - Protein / creatinine ratio, urine  4. Multigravida of advanced maternal age in third trimester Stable   6. Language barrier Live interrupter used during today's visit  7. Hypothyroidism during pregnancy, antepartum  - levothyroxine (SYNTHROID) 175 MCG tablet; Take 1 tablet (175 mcg total) by mouth daily.  Dispense: 30 tablet; Refill: 5  Preterm labor symptoms and general obstetric precautions including but not limited to vaginal bleeding, contractions, leaking of fluid and fetal movement were reviewed in detail with the patient. Please refer to After Visit Summary for other counseling recommendations.  Return in about 2 weeks (around 12/05/2020) for OB visit, face to face, MD only.   ErChancy MilroyMD

## 2020-11-21 NOTE — Progress Notes (Signed)
Pt informed that the ultrasound is considered a limited OB ultrasound and is not intended to be a complete ultrasound exam.  Patient also informed that the ultrasound is not being completed with the intent of assessing for fetal or placental anomalies or any pelvic abnormalities.  Explained that the purpose of today's ultrasound is to assess for  BPP, presentation, and AFI.  Patient acknowledges the purpose of the exam and the limitations of the study.     Koren Bound RN BSN 11/21/20

## 2020-11-21 NOTE — Patient Instructions (Signed)
Tercer trimestre de Media planner Third Trimester of Pregnancy El tercer trimestre de embarazo va desde la semana 28 hasta la semana 40. Tambin se dice que va desde el mes 7 hasta el mes 9. En este trimestre, el beb en gestacin (feto) crece muy rpidamente. Hacia el final del noveno mes, el beb en gestacin mide alrededor de 20 pulgadas (45 cm) de largo. Pesa entre 6 y 69 libras (2,70 y 4,50 kg). Cambios en el cuerpo durante el tercer trimestre Su organismo contina atravesando por muchos cambios durante este perodo. Los cambios varan y generalmente vuelven a la normalidad despus del nacimiento del beb. Cambios fsicos Seguir [employer]. Puede ser que aumente entre 25 y 51 libras (11 y 16 kg) hacia el final del Media planner. Si tiene [employer], puede aumentar entre 28 y 40 lb (unos 13 a 18 kg). Si tiene sobrepeso, puede aumentar entre 15 y 25 libras (unos 7 a 11 kg). Podrn aparecer las primeras estras en las caderas, el vientre (abdomen) y las [place]. [place] [employer] seguirn creciendo y Tourist information centre manager. Un lquido amarillo Public affairs consultant) puede salir de sus pechos. Esta es la primera leche que usted produce para el beb. Tal vez haya cambios en el cabello. El ombligo puede salir hacia afuera. Puede observar que se le hinchan ms las manos, la cara o los tobillos. Cambios en la salud Es posible que tenga acidez estomacal. Es posible que tenga dificultades para defecar (estreimiento). Pueden aparecerle hemorroides. Estas son venas hinchadas en el ano que pueden picar o doler. Puede comenzar a tener venas hinchadas (vrices) en las piernas. Puede presentar ms dolor en la pelvis, la espalda o los muslos. Puede presentar ms hormigueo o entumecimiento en las manos, los brazos y las piernas. La piel de su vientre tambin puede sentirse entumecida. Es posible que sienta falta de aire a medida que el tero se [place]. Otros cambios Es posible que haga pis (orine) con mayor frecuencia. Puede tener ms  problemas para dormir. Puede notar que el beb en gestacin "baja" o se mueve ms hacia bajo, en el vientre. Puede notar ms secrecin proveniente de la vagina. Puede sentir las articulaciones flojas y Agricultural consultant alrededor del hueso plvico. Siga estas instrucciones en su casa: Medicamentos Use los medicamentos de venta libre y los recetados solamente como se lo haya indicado el mdico. Algunos medicamentos no son seguros Solicitor. Tome vitaminas prenatales que contengan por lo menos 600 microgramos (mcg) de cido flico. Comida y bebida Consuma comidas saludables que incluyan lo siguiente: Lambert Mody y verduras frescas. Cereales integrales. Buenas fuentes de protenas, como carne, huevos y tofu. Productos lcteos con bajo contenido de [place]. Evite la carne cruda y el [place], la Stacy y el queso sin Radio producer. Estos portan grmenes que pueden provocar dao tanto a usted como al beb. Tome 4 o 5 comidas pequeas en lugar de 3 comidas abundantes al da. Es posible que deba tomar medidas para prevenir o tratar los problemas para defecar: Electronics engineer suficiente lquido para Contractor pis (orina) de color amarillo plido. Come alimentos ricos en fibra. Entre ellos, frijoles, cereales integrales y frutas y verduras frescas. Limitar los alimentos con alto contenido de grasa y Location manager. Estos incluyen alimentos fritos o dulces. Actividad Haga ejercicios solamente como se lo haya indicado el mdico. Interrumpa la actividad fsica si comienza a tener clicos en el tero. Evite levantar pesos [employer]. No haga ejercicio si hace demasiado calor, hay demasiada humedad o se encuentra en un lugar de Sales executive (  altitud elevada). Si lo desea, puede continuar teniendo [employer], a menos que el mdico le indique lo contrario. Alivio del dolor y del malestar Haga pausas con frecuencia y descanse con las piernas levantadas (elevadas) si tiene calambres en las piernas o dolor en la parte  baja de la espalda. Dese baos de asiento con agua tibia para Best boy o las molestias causadas por las hemorroides. Use una crema para las hemorroides si el mdico la autoriza. Use un sostn que le brinde buen soporte si sus mamas estn sensibles. Si desarrolla venas hinchadas y abultadas en las piernas: Use medias de compresin segn las indicaciones de su mdico. Levante los pies durante 15 minutos, 3 o 4 veces por Training and development officer. Limite la sal en sus alimentos. Seguridad Hable con el mdico antes de Control and instrumentation engineer. No se d baos de inmersin en agua caliente, baos turcos ni saunas. Use el cinturn de seguridad en todo momento mientras vaya en auto. Hable con el mdico si alguien le est haciendo dao o gritando [place]. Preparacin para la llegada del beb Para prepararse para la llegada de su beb: Tome clases prenatales. Visite el hospital y recorra el rea de maternidad. Compre un asiento de seguridad [employer] atrs para llevar al beb en el automvil. Aprenda cmo instalarlo en el auto. Prepare la habitacin del beb. Saque todas las almohadas y los animales de peluche de la cuna del beb. Instrucciones generales Evite el contacto con las bandejas sanitarias de los gatos y la tierra que estos animales usan. Estos contienen grmenes que pueden daar al beb y causar la prdida del beb ya sea aborto espontneo o muerte fetal. No se haga duchas vaginales ni use tampones. No use tampones ni toallas higinicas perfumadas. No fume ni consuma ningn producto que contenga nicotina o tabaco. Si necesita ayuda para dejar de fumar, consulte al mdico. No beba alcohol. No use medicamentos a base de hierbas, drogas ilegales, ni medicamentos que el mdico no haya autorizado. Las sustancias qumicas de estos productos pueden afectar al beb. Cumpla con todas las visitas de seguimiento. Esto es importante. Dnde buscar ms informacin American Pregnancy Association (Asociacin  Americana del Embarazo): americanpregnancy.org [employer] of Obstetricians and Gynecologists (Colegio Estadounidense de Obstetras y Gineclogos): www.acog.org Office on [employer] ([place]): KeywordPortfolios.com.br Comunquese con un mdico si: Tiene fiebre. Tiene clicos leves o siente presin en la parte baja del vientre. Sufre un dolor persistente en el abdomen. Vomita o hace deposiciones acuosas (diarrea). Advierte lquido con mal olor que proviene de la vagina. Siente dolor al orinar o hace orina con mal olor. Tiene un dolor de cabeza que no desaparece despus de Teacher, adult education. Nota cambios en la visin o ve manchas delante de los ojos. Solicite ayuda de inmediato si: Rompe la bolsa. Tiene contracciones regulares separadas por menos de 5 minutos. Tiene sangrado o pequeas prdidas vaginales. Tiene clicos o dolor muy intensos en el vientre. Tiene dificultad para respirar. Sientes dolor en el pecho. Se desmaya. No ha sentido al beb moverse durante el tiempo que le indic el mdico. Tiene dolor, hinchazn o enrojecimiento nuevos en un brazo o una pierna o se produce un aumento de alguno de estos sntomas. Resumen El tercer trimestre comprende desde la semana 28 hasta la semana 40 (desde el mes 7 hasta el mes 9). Esta es la poca en que el beb en gestacin crece muy rpidamente. Durante este perodo, las molestias pueden aumentar a medida que usted sube  de peso y el beb crece. Preprese para la llegada del beb: asista a las clases prenatales, compre un asiento de seguridad orientado hacia atrs para llevar al beb en auto y prepare la habitacin del beb. Solicite ayuda de inmediato si tiene sangrado por la vagina, siente dolor en el pecho y tiene dificultad para respirar, o si no ha sentido al beb moverse durante el tiempo que le indic el mdico. Esta informacin no tiene Marine scientist el consejo del mdico. Asegrese de hacerle al  mdico cualquier pregunta que tenga. Document Revised: 07/22/2019 Document Reviewed: 07/22/2019 Elsevier Patient Education  [place].

## 2020-11-22 ENCOUNTER — Telehealth: Payer: Self-pay

## 2020-11-22 ENCOUNTER — Other Ambulatory Visit: Payer: Self-pay

## 2020-11-22 NOTE — Telephone Encounter (Signed)
Called pt and asked if she could come in tomorrow to leave a urine sample to complete her lab work from yesterday.  Pt reports that she will be able to come in 11/23/20 in the am.  Lab notified.   Alyxandra Tenbrink,RN 11/22/20

## 2020-11-23 ENCOUNTER — Telehealth: Payer: Self-pay

## 2020-11-23 ENCOUNTER — Ambulatory Visit: Payer: Self-pay

## 2020-11-23 LAB — COMPREHENSIVE METABOLIC PANEL
ALT: 20 IU/L (ref 0–32)
AST: 16 IU/L (ref 0–40)
Albumin/Globulin Ratio: 1.1 — ABNORMAL LOW (ref 1.2–2.2)
Albumin: 3.2 g/dL — ABNORMAL LOW (ref 3.8–4.8)
Alkaline Phosphatase: 208 IU/L — ABNORMAL HIGH (ref 44–121)
BUN/Creatinine Ratio: 10 (ref 9–23)
BUN: 13 mg/dL (ref 6–20)
Bilirubin Total: <0.2 mg/dL (ref 0.0–1.2)
CO2: 16 mmol/L — ABNORMAL LOW (ref 20–29)
Calcium: 9.3 mg/dL (ref 8.7–10.2)
Chloride: 109 mmol/L — ABNORMAL HIGH (ref 96–106)
Creatinine, Ser: 1.3 mg/dL — ABNORMAL HIGH (ref 0.57–1.00)
Globulin, Total: 2.9 g/dL (ref 1.5–4.5)
Glucose: 116 mg/dL — ABNORMAL HIGH (ref 70–99)
Potassium: 4.9 mmol/L (ref 3.5–5.2)
Sodium: 139 mmol/L (ref 134–144)
Total Protein: 6.1 g/dL (ref 6.0–8.5)
eGFR: 54 mL/min/{1.73_m2} — ABNORMAL LOW (ref >59)

## 2020-11-23 LAB — CBC
Hematocrit: 34.6 % (ref 34.0–46.6)
Hemoglobin: 12 g/dL (ref 11.1–15.9)
MCH: 31.5 pg (ref 26.6–33.0)
MCHC: 34.7 g/dL (ref 31.5–35.7)
MCV: 91 fL (ref 79–97)
Platelets: 203 10*3/uL (ref 150–450)
RBC: 3.81 x10E6/uL (ref 3.77–5.28)
RDW: 12.6 % (ref 11.7–15.4)
WBC: 11.4 10*3/uL — ABNORMAL HIGH (ref 3.4–10.8)

## 2020-11-23 NOTE — Telephone Encounter (Signed)
Pt did not keep nurse visit to leave urine sample. Called pt with in person interpreter Lattie Haw. Pt states she did come into office this morning to leave a urine sample. Verified with lab that this has been collected.

## 2020-11-24 LAB — PROTEIN / CREATININE RATIO, URINE
Creatinine, Urine: 70.7 mg/dL
Protein, Ur: 490.9 mg/dL
Protein/Creat Ratio: 6943 mg/g creat — ABNORMAL HIGH (ref 0–200)

## 2020-11-25 ENCOUNTER — Telehealth: Payer: Self-pay

## 2020-11-25 NOTE — Telephone Encounter (Signed)
Attempted to call patient x 2 using specific interpreter language line ID [phone] to inform that she need to go to MAU for further evaluation per Dr. Rip Harbour due to abnormal labs.  Message was left on vm for patient to go to MAU for evaluation ASAP.

## 2020-11-28 ENCOUNTER — Encounter: Payer: Self-pay | Admitting: Obstetrics & Gynecology

## 2020-11-30 ENCOUNTER — Ambulatory Visit: Payer: Self-pay

## 2020-11-30 ENCOUNTER — Ambulatory Visit: Payer: Self-pay | Attending: Obstetrics

## 2020-12-05 ENCOUNTER — Ambulatory Visit (INDEPENDENT_AMBULATORY_CARE_PROVIDER_SITE_OTHER): Payer: Self-pay

## 2020-12-05 ENCOUNTER — Inpatient Hospital Stay (HOSPITAL_COMMUNITY)
Admission: AD | Admit: 2020-12-05 | Discharge: 2020-12-09 | DRG: 806 | Disposition: A | Discharge status: Home / Self Care | Payer: Medicaid Other | Attending: Obstetrics & Gynecology | Admitting: Obstetrics & Gynecology

## 2020-12-05 ENCOUNTER — Ambulatory Visit: Payer: Self-pay | Admitting: *Deleted

## 2020-12-05 ENCOUNTER — Other Ambulatory Visit: Payer: Self-pay

## 2020-12-05 ENCOUNTER — Encounter (HOSPITAL_COMMUNITY): Payer: Self-pay | Admitting: Family Medicine

## 2020-12-05 VITALS — BP 201/97 | HR 80 | Wt 182.9 lb

## 2020-12-05 DIAGNOSIS — E871 Hypo-osmolality and hyponatremia: Secondary | ICD-10-CM | POA: Diagnosis present

## 2020-12-05 DIAGNOSIS — O133 Gestational [pregnancy-induced] hypertension without significant proteinuria, third trimester: Secondary | ICD-10-CM

## 2020-12-05 DIAGNOSIS — O43123 Velamentous insertion of umbilical cord, third trimester: Secondary | ICD-10-CM | POA: Diagnosis present

## 2020-12-05 DIAGNOSIS — O1490 Unspecified pre-eclampsia, unspecified trimester: Secondary | ICD-10-CM | POA: Diagnosis present

## 2020-12-05 DIAGNOSIS — O1211 Gestational proteinuria, first trimester: Secondary | ICD-10-CM | POA: Diagnosis present

## 2020-12-05 DIAGNOSIS — Z3A35 35 weeks gestation of pregnancy: Secondary | ICD-10-CM | POA: Diagnosis not present

## 2020-12-05 DIAGNOSIS — O1413 Severe pre-eclampsia, third trimester: Secondary | ICD-10-CM

## 2020-12-05 DIAGNOSIS — Z794 Long term (current) use of insulin: Secondary | ICD-10-CM

## 2020-12-05 DIAGNOSIS — O43199 Other malformation of placenta, unspecified trimester: Secondary | ICD-10-CM | POA: Diagnosis present

## 2020-12-05 DIAGNOSIS — Z758 Other problems related to medical facilities and other health care: Secondary | ICD-10-CM | POA: Diagnosis present

## 2020-12-05 DIAGNOSIS — O1414 Severe pre-eclampsia complicating childbirth: Secondary | ICD-10-CM | POA: Diagnosis present

## 2020-12-05 DIAGNOSIS — Z20822 Contact with and (suspected) exposure to covid-19: Secondary | ICD-10-CM | POA: Diagnosis present

## 2020-12-05 DIAGNOSIS — E119 Type 2 diabetes mellitus without complications: Secondary | ICD-10-CM

## 2020-12-05 DIAGNOSIS — O09529 Supervision of elderly multigravida, unspecified trimester: Secondary | ICD-10-CM

## 2020-12-05 DIAGNOSIS — Z789 Other specified health status: Secondary | ICD-10-CM | POA: Diagnosis present

## 2020-12-05 DIAGNOSIS — E1122 Type 2 diabetes mellitus with diabetic chronic kidney disease: Secondary | ICD-10-CM | POA: Diagnosis present

## 2020-12-05 DIAGNOSIS — E1142 Type 2 diabetes mellitus with diabetic polyneuropathy: Secondary | ICD-10-CM | POA: Diagnosis present

## 2020-12-05 DIAGNOSIS — O1493 Unspecified pre-eclampsia, third trimester: Secondary | ICD-10-CM

## 2020-12-05 DIAGNOSIS — Z8759 Personal history of other complications of pregnancy, childbirth and the puerperium: Secondary | ICD-10-CM | POA: Diagnosis present

## 2020-12-05 DIAGNOSIS — O1410 Severe pre-eclampsia, unspecified trimester: Secondary | ICD-10-CM | POA: Diagnosis present

## 2020-12-05 DIAGNOSIS — O2412 Pre-existing diabetes mellitus, type 2, in childbirth: Secondary | ICD-10-CM | POA: Diagnosis present

## 2020-12-05 DIAGNOSIS — O24319 Unspecified pre-existing diabetes mellitus in pregnancy, unspecified trimester: Secondary | ICD-10-CM

## 2020-12-05 DIAGNOSIS — E039 Hypothyroidism, unspecified: Secondary | ICD-10-CM | POA: Diagnosis present

## 2020-12-05 DIAGNOSIS — O0990 Supervision of high risk pregnancy, unspecified, unspecified trimester: Secondary | ICD-10-CM

## 2020-12-05 DIAGNOSIS — Z603 Acculturation difficulty: Secondary | ICD-10-CM | POA: Diagnosis present

## 2020-12-05 DIAGNOSIS — O24424 Gestational diabetes mellitus in childbirth, insulin controlled: Secondary | ICD-10-CM | POA: Diagnosis not present

## 2020-12-05 DIAGNOSIS — O99284 Endocrine, nutritional and metabolic diseases complicating childbirth: Secondary | ICD-10-CM | POA: Diagnosis present

## 2020-12-05 LAB — GLUCOSE, CAPILLARY
Glucose-Capillary: 129 mg/dL — ABNORMAL HIGH (ref 70–99)
Glucose-Capillary: 105 mg/dL — ABNORMAL HIGH (ref 70–99)
Glucose-Capillary: 109 mg/dL — ABNORMAL HIGH (ref 70–99)
Glucose-Capillary: 79 mg/dL (ref 70–99)
Glucose-Capillary: 83 mg/dL (ref 70–99)
Glucose-Capillary: 95 mg/dL (ref 70–99)

## 2020-12-05 LAB — COMPREHENSIVE METABOLIC PANEL
ALT: 23 U/L (ref 0–44)
AST: 23 U/L (ref 15–41)
Albumin: 2.1 g/dL — ABNORMAL LOW (ref 3.5–5.0)
Alkaline Phosphatase: 189 U/L — ABNORMAL HIGH (ref 38–126)
Anion gap: 8 (ref 5–15)
BUN: 26 mg/dL — ABNORMAL HIGH (ref 6–20)
CO2: 18 mmol/L — ABNORMAL LOW (ref 22–32)
Calcium: 8.6 mg/dL — ABNORMAL LOW (ref 8.9–10.3)
Chloride: 110 mmol/L (ref 98–111)
Creatinine, Ser: 1.62 mg/dL — ABNORMAL HIGH (ref 0.44–1.00)
GFR, Estimated: 41 mL/min — ABNORMAL LOW (ref >60)
Glucose, Bld: 165 mg/dL — ABNORMAL HIGH (ref 70–99)
Potassium: 4.4 mmol/L (ref 3.5–5.1)
Sodium: 136 mmol/L (ref 135–145)
Total Bilirubin: 0.3 mg/dL (ref 0.3–1.2)
Total Protein: 5.7 g/dL — ABNORMAL LOW (ref 6.5–8.1)

## 2020-12-05 LAB — CBC
HCT: 34.3 % — ABNORMAL LOW (ref 36.0–46.0)
Hemoglobin: 11.8 g/dL — ABNORMAL LOW (ref 12.0–15.0)
MCH: 30.7 pg (ref 26.0–34.0)
MCHC: 34.4 g/dL (ref 30.0–36.0)
MCV: 89.3 fL (ref 80.0–100.0)
Platelets: 178 10*3/uL (ref 150–400)
RBC: 3.84 MIL/uL — ABNORMAL LOW (ref 3.87–5.11)
RDW: 12.5 % (ref 11.5–15.5)
WBC: 9.4 10*3/uL (ref 4.0–10.5)
nRBC: 0 % (ref 0.0–0.2)

## 2020-12-05 LAB — PROTEIN / CREATININE RATIO, URINE
Creatinine, Urine: 53.68 mg/dL
Protein Creatinine Ratio: 12.3 mg/mg{Cre} — ABNORMAL HIGH (ref 0.00–0.15)
Total Protein, Urine: 660 mg/dL

## 2020-12-05 LAB — TYPE AND SCREEN
ABO/RH(D): O POS
Antibody Screen: NEGATIVE

## 2020-12-05 LAB — RESP PANEL BY RT-PCR (FLU A&B, COVID) ARPGX2
Influenza A by PCR: NEGATIVE
Influenza B by PCR: NEGATIVE
SARS Coronavirus 2 by RT PCR: NEGATIVE

## 2020-12-05 MED ORDER — LACTATED RINGERS IV SOLN: INTRAVENOUS | Status: DC

## 2020-12-05 MED ORDER — SOD CITRATE-CITRIC ACID 500-334 MG/5ML PO SOLN: 30.0000 mL | ORAL | Status: DC | PRN

## 2020-12-05 MED ORDER — OXYTOCIN BOLUS FROM INFUSION
333.0000 mL | Freq: Once | INTRAVENOUS | Status: AC
  Administered 2020-12-07: 333 mL via INTRAVENOUS

## 2020-12-05 MED ORDER — TERBUTALINE SULFATE 1 MG/ML IJ SOLN
0.2500 mg | Freq: Once | INTRAMUSCULAR | Status: AC | PRN
  Administered 2020-12-07: 0.25 mg via SUBCUTANEOUS
  Filled 2020-12-05: qty 1

## 2020-12-05 MED ORDER — HYDRALAZINE HCL 20 MG/ML IJ SOLN
10.0000 mg | INTRAMUSCULAR | Status: DC | PRN
  Administered 2020-12-05: 10 mg via INTRAVENOUS
  Filled 2020-12-05: qty 1

## 2020-12-05 MED ORDER — PENICILLIN G POT IN DEXTROSE 60000 UNIT/ML IV SOLN
3.0000 10*6.[IU] | INTRAVENOUS | Status: DC
  Administered 2020-12-05 – 2020-12-07 (×7): 3 10*6.[IU] via INTRAVENOUS
  Filled 2020-12-05 (×7): qty 50

## 2020-12-05 MED ORDER — OXYCODONE-ACETAMINOPHEN 5-325 MG PO TABS: 1.0000 | ORAL_TABLET | ORAL | Status: DC | PRN

## 2020-12-05 MED ORDER — LABETALOL HCL 5 MG/ML IV SOLN
40.0000 mg | INTRAVENOUS | Status: DC | PRN
  Administered 2020-12-05 – 2020-12-06 (×4): 40 mg via INTRAVENOUS
  Filled 2020-12-05 (×4): qty 8

## 2020-12-05 MED ORDER — INSULIN REGULAR(HUMAN) IN NACL 100-0.9 UT/100ML-% IV SOLN
INTRAVENOUS | Status: DC
  Administered 2020-12-05: 1.2 [IU]/h via INTRAVENOUS
  Filled 2020-12-05 (×2): qty 100

## 2020-12-05 MED ORDER — LIDOCAINE HCL (PF) 1 % IJ SOLN: 30.0000 mL | INTRAMUSCULAR | Status: DC | PRN

## 2020-12-05 MED ORDER — ACETAMINOPHEN 325 MG PO TABS
650.0000 mg | ORAL_TABLET | ORAL | Status: DC | PRN
  Administered 2020-12-05 – 2020-12-06 (×2): 650 mg via ORAL
  Filled 2020-12-05 (×2): qty 2

## 2020-12-05 MED ORDER — ONDANSETRON HCL 4 MG/2ML IJ SOLN
4.0000 mg | Freq: Four times a day (QID) | INTRAMUSCULAR | Status: DC | PRN
  Administered 2020-12-06: 4 mg via INTRAVENOUS
  Filled 2020-12-05: qty 2

## 2020-12-05 MED ORDER — TERBUTALINE SULFATE 1 MG/ML IJ SOLN: 0.2500 mg | Freq: Once | INTRAMUSCULAR | Status: DC | PRN

## 2020-12-05 MED ORDER — MAGNESIUM SULFATE 40 GM/1000ML IV SOLN
1.0000 g/h | INTRAVENOUS | Status: DC
  Administered 2020-12-05 – 2020-12-07 (×2): 1 g/h via INTRAVENOUS
  Filled 2020-12-05 (×2): qty 1000

## 2020-12-05 MED ORDER — MAGNESIUM SULFATE BOLUS VIA INFUSION
2.0000 g | Freq: Once | INTRAVENOUS | Status: AC
  Administered 2020-12-05: 2 g via INTRAVENOUS
  Filled 2020-12-05: qty 1000

## 2020-12-05 MED ORDER — LABETALOL HCL 5 MG/ML IV SOLN
80.0000 mg | INTRAVENOUS | Status: DC | PRN
  Administered 2020-12-05 (×2): 80 mg via INTRAVENOUS
  Filled 2020-12-05 (×2): qty 16

## 2020-12-05 MED ORDER — DEXTROSE IN LACTATED RINGERS 5 % IV SOLN: INTRAVENOUS | Status: DC

## 2020-12-05 MED ORDER — MISOPROSTOL 50MCG HALF TABLET
50.0000 ug | ORAL_TABLET | ORAL | Status: DC | PRN
  Administered 2020-12-05 – 2020-12-06 (×5): 50 ug via BUCCAL
  Filled 2020-12-05 (×5): qty 1

## 2020-12-05 MED ORDER — LABETALOL HCL 5 MG/ML IV SOLN
20.0000 mg | INTRAVENOUS | Status: DC | PRN
  Administered 2020-12-05 – 2020-12-07 (×6): 20 mg via INTRAVENOUS
  Filled 2020-12-05 (×6): qty 4

## 2020-12-05 MED ORDER — OXYCODONE-ACETAMINOPHEN 5-325 MG PO TABS: 2.0000 | ORAL_TABLET | ORAL | Status: DC | PRN

## 2020-12-05 MED ORDER — LEVOTHYROXINE SODIUM 175 MCG PO TABS
175.0000 ug | ORAL_TABLET | Freq: Every day | ORAL | Status: DC
  Administered 2020-12-06: 175 ug via ORAL
  Filled 2020-12-05 (×3): qty 1

## 2020-12-05 MED ORDER — DEXTROSE 50 % IV SOLN
0.0000 mL | INTRAVENOUS | Status: DC | PRN
Start: 2020-12-05 — End: 2020-12-07

## 2020-12-05 MED ORDER — SODIUM CHLORIDE 0.9 % IV SOLN
5.0000 10*6.[IU] | Freq: Once | INTRAVENOUS | Status: AC
  Administered 2020-12-05: 5 10*6.[IU] via INTRAVENOUS
  Filled 2020-12-05: qty 5

## 2020-12-05 MED ORDER — OXYTOCIN-SODIUM CHLORIDE 30-0.9 UT/500ML-% IV SOLN
2.5000 [IU]/h | INTRAVENOUS | Status: DC
  Administered 2020-12-07: 2.5 [IU]/h via INTRAVENOUS
  Filled 2020-12-05: qty 500

## 2020-12-05 MED ORDER — LABETALOL HCL 100 MG PO TABS
100.0000 mg | ORAL_TABLET | Freq: Two times a day (BID) | ORAL | Status: DC
Start: 2020-12-05 — End: 2020-12-06
  Administered 2020-12-05: 100 mg via ORAL
  Filled 2020-12-05: qty 1

## 2020-12-05 MED ORDER — LACTATED RINGERS IV SOLN: 500.0000 mL | INTRAVENOUS | Status: DC | PRN

## 2020-12-05 NOTE — MAU Provider Note (Signed)
History     CSN: [id number]  Arrival date and time: 12/05/20 1243   Event Date/Time   First Provider Initiated Contact with Patient 12/05/20 1443      Chief Complaint  Patient presents with   Headache   Hypertension   Foot Swelling   Facial Swelling   HPI This is a 40yo G2P1001 at 37w4dwho presents with elevated BP over the weekend in the 200/90 range. She has been having a headache that was 10/10 over the weekend and better now. BPs have remained elevated.   Pregnancy complicated by DM2, hyperthyroid.   OB History     Gravida  2   Para  1   Term  1   Preterm  0   AB  0   Living  1      SAB  0   IAB  0   Ectopic  0   Multiple  0   Live Births  1           Past Medical History:  Diagnosis Date   Diabetes mellitus without complication (HCC)    type 2   High cholesterol    Hyperthyroidism    Thyroid cancer ([place]     Past Surgical History:  Procedure Laterality Date   NO PAST SURGERIES      Family History  Family history unknown: Yes    Social History   Tobacco Use   Smoking status: Never   Smokeless tobacco: Never  Vaping Use   Vaping Use: Never used  Substance Use Topics   Alcohol use: No    Comment: occ   Drug use: No    Allergies: No Known Allergies  Medications Prior to Admission  Medication Sig Dispense Refill Last Dose   Blood Glucose Monitoring Suppl (TRUE METRIX METER) w/Device KIT Use as instructed 1 kit 0    glucose blood (TRUE METRIX BLOOD GLUCOSE TEST) test strip Use as instructed. Check blood glucose levels twice per day. 100 each 12    insulin aspart (NOVOLOG) 100 UNIT/ML FlexPen Inject 8 Units into the skin 3 (three) times daily with meals. 15 mL 11    insulin aspart (NOVOLOG) 100 UNIT/ML injection Inject 8 Units into the skin 3 (three) times daily with meals. 10 mL 11    insulin detemir (LEVEMIR) 100 UNIT/ML FlexPen Inject 10 unit in the morning, inject 52 units in the evening. 18 mL 11    Insulin  Syringe-Needle U-100 30G X 5/16" 1 ML MISC Inject 1 application into the skin 2 times daily at 12 noon and 4 pm. 100 each 5    levothyroxine (SYNTHROID) 175 MCG tablet Take 1 tablet (175 mcg total) by mouth daily. 30 tablet 5    prenatal vitamin w/FE, FA (PRENATAL 1 + 1) 27-1 MG TABS tablet Take 1 tablet by mouth daily at 12 noon. 30 tablet 11    TRUEplus Lancets 28G MISC Use as instructed. Check blood glucose levels twice per day. 100 each 3     Review of Systems Physical Exam   Blood pressure (!) 190/92, pulse 78, temperature 98.2 F (36.8 C), temperature source Oral, resp. rate 18, height 5' 4"  (1.626 m), weight 83 kg, last menstrual period 02/11/2020, SpO2 99 %.  Physical Exam Vitals reviewed.  Constitutional:      Appearance: She is well-developed.  HENT:     Head: Normocephalic and atraumatic.  Cardiovascular:     Rate and Rhythm: Normal rate and regular rhythm.  Heart sounds: Normal heart sounds.  Pulmonary:     Effort: Pulmonary effort is normal.     Breath sounds: Normal breath sounds.  Abdominal:     General: Bowel sounds are normal.     Palpations: Abdomen is soft.  Skin:    General: Skin is warm and dry.     Capillary Refill: Capillary refill takes less than 2 seconds.  Neurological:     Mental Status: She is alert and oriented to person, place, and time.  Psychiatric:        Mood and Affect: Mood normal.        Speech: Speech normal.        Behavior: Behavior normal.   Results for orders placed or performed during the hospital encounter of 12/05/20 (from the past 24 hour(s))  Comprehensive metabolic panel     Status: Abnormal   Collection Time: 12/05/20  1:34 PM  Result Value Ref Range   Sodium 136 135 - 145 mmol/L   Potassium 4.4 3.5 - 5.1 mmol/L   Chloride 110 98 - 111 mmol/L   CO2 18 (L) 22 - 32 mmol/L   Glucose, Bld 165 (H) 70 - 99 mg/dL   BUN 26 (H) 6 - 20 mg/dL   Creatinine, Ser 1.62 (H) 0.44 - 1.00 mg/dL   Calcium 8.6 (L) 8.9 - 10.3 mg/dL    Total Protein 5.7 (L) 6.5 - 8.1 g/dL   Albumin 2.1 (L) 3.5 - 5.0 g/dL   AST 23 15 - 41 U/L   ALT 23 0 - 44 U/L   Alkaline Phosphatase 189 (H) 38 - 126 U/L   Total Bilirubin 0.3 0.3 - 1.2 mg/dL   GFR, Estimated 41 (L) >60 mL/min   Anion gap 8 5 - 15  CBC     Status: Abnormal   Collection Time: 12/05/20  1:34 PM  Result Value Ref Range   WBC 9.4 4.0 - 10.5 K/uL   RBC 3.84 (L) 3.87 - 5.11 MIL/uL   Hemoglobin 11.8 (L) 12.0 - 15.0 g/dL   HCT 34.3 (L) 36.0 - 46.0 %   MCV 89.3 80.0 - 100.0 fL   MCH 30.7 26.0 - 34.0 pg   MCHC 34.4 30.0 - 36.0 g/dL   RDW 12.5 11.5 - 15.5 %   Platelets 178 150 - 400 K/uL   nRBC 0.0 0.0 - 0.2 %     MAU Course  Procedures NST:  Baseline: 130s  Variability: moderate Accelerations: ++  Decelerations: none Contractions: none   MDM Labetalol protocol ordered. Check labs  Assessment and Plan   1. Gestational hypertension, third trimester   2. [redacted] weeks gestation of pregnancy   3. Severe pre-eclampsia in third trimester   4. Type 2 diabetes mellitus without complication, without long-term current use of insulin (HCC)    Severe preeclampsia. Start magnesium - 2g/1g due to renal dysfunction Serial mag levels, CBC, CMP. Induce labor.  Labor team notified.   Audrey Mcmillan 12/05/2020, 2:43 PM

## 2020-12-05 NOTE — MAU Note (Signed)
BP was too high at the dr, sent over for further eval.  Lab work drawn while in lobby. +HA, denies  visual changes or epigastric pain.  +swelling in face/eyes and feet. Denies bleeding or LOF, reports +FM.

## 2020-12-05 NOTE — H&P (Addendum)
OBSTETRIC ADMISSION HISTORY AND PHYSICAL  Audrey Mcmillan is a 40 y.o. female G2P1001 with IUP at 38w4dby US@[redacted]w[redacted]d  presenting for IOL for pre-eclampsia with severe features. She reports +FMs, No LOF, no VB, no blurry vision, headaches or peripheral edema, and RUQ pain.  She plans on breast feeding. She would like to use a post placental liletta IUD for her contraception.  [facility] By 9wk3d [place]--->  Estimated Date of Delivery: 01/05/21  Sono:    @[redacted]w[redacted]d , CWD, normal anatomy, cephalic presentation, 14193X 16% EFW   Prenatal History/Complications:  AMA, gHTN, T2DM on insulin, hypothyroidism, marginal cord insertion, hx of AKI w likely CKD, pyelo early in pregnancy  Past Medical History: Past Medical History:  Diagnosis Date   Diabetes mellitus without complication ([place]    type 2   High cholesterol    Hyperthyroidism    Thyroid cancer ([place]     Past Surgical History: Past Surgical History:  Procedure Laterality Date   NO PAST SURGERIES      Obstetrical History: OB History     Gravida  2   Para  1   Term  1   Preterm  0   AB  0   Living  1      SAB  0   IAB  0   Ectopic  0   Multiple  0   Live Births  1           Social History Social History   Socioeconomic History   Marital status: Single    Spouse name: Not on file   Number of children: Not on file   Years of education: Not on file   Highest education level: Not on file  Occupational History   Not on file  Tobacco Use   Smoking status: Never   Smokeless tobacco: Never  Vaping Use   Vaping Use: Never used  Substance and Sexual Activity   Alcohol use: No    Comment: occ   Drug use: No   Sexual activity: Yes    Birth control/protection: None  Other Topics Concern   Not on file  Social History Narrative   ** Merged History Encounter **       Social Determinants of Health   Financial Resource Strain: Not on file  Food  Insecurity: Food Insecurity Present   [place] Last [place]: Sometimes true   Ran Out of Food in the Last Year: Sometimes true  Transportation Needs: No Transportation Needs   Lack of Transportation (Medical): No   Lack of Transportation (Non-Medical): No  Physical Activity: Not on file  Stress: Not on file  Social Connections: Not on file    Family History: Family History  Family history unknown: Yes    Allergies: No Known Allergies  Medications Prior to Admission  Medication Sig Dispense Refill Last Dose   Blood Glucose Monitoring Suppl (TRUE METRIX METER) w/Device KIT Use as instructed 1 kit 0    glucose blood (TRUE METRIX BLOOD GLUCOSE TEST) test strip Use as instructed. Check blood glucose levels twice per day. 100 each 12    insulin aspart (NOVOLOG) 100 UNIT/ML FlexPen Inject 8 Units into the skin 3 (three) times daily with meals. 15 mL 11    insulin aspart (NOVOLOG) 100 UNIT/ML injection Inject 8 Units into the skin 3 (three) times daily with meals. 10 mL 11    insulin detemir (  LEVEMIR) 100 UNIT/ML FlexPen Inject 10 unit in the morning, inject 52 units in the evening. 18 mL 11    Insulin Syringe-Needle U-100 30G X 5/16" 1 ML MISC Inject 1 application into the skin 2 times daily at 12 noon and 4 pm. 100 each 5    levothyroxine (SYNTHROID) 175 MCG tablet Take 1 tablet (175 mcg total) by mouth daily. 30 tablet 5    prenatal vitamin w/FE, FA (PRENATAL 1 + 1) 27-1 MG TABS tablet Take 1 tablet by mouth daily at 12 noon. 30 tablet 11    TRUEplus Lancets 28G MISC Use as instructed. Check blood glucose levels twice per day. 100 each 3      Review of Systems   All systems reviewed and negative except as stated in HPI  Blood pressure (!) 164/87, pulse 78, temperature 98.2 F (36.8 C), temperature source Oral, resp. rate 18, height 5' 4"  (1.626 m), weight 83 kg, last menstrual period 02/11/2020, SpO2 98 %. General appearance: alert, cooperative, and no  distress Lungs: clear to auscultation bilaterally Heart: regular rate and rhythm Abdomen: soft, non-tender; bowel sounds normal Pelvic: Normal external genitalia  Extremities: Homans sign is negative, no sign of DVT Presentation: cephalic Fetal monitoringBaseline: 120 bpm, Variability: Good {> 6 bpm), Accelerations: Reactive, and Decelerations: Absent Uterine activityFrequency: a few times per hour     Prenatal labs: ABO, Rh: --/--/O POS, O POS (05/15 2305) Antibody: NEG (05/15 2305) Rubella: 12.10 (05/20 0954) RPR: Non Reactive (05/20 0954)  HBsAg: Negative (05/20 0954)  HIV: Non Reactive (05/20 0954)  GBS:   unknown, culture swab sent A1c: 10.4 on 06/03/20 Genetic screening  NIPS low risk, Horizon negative 4/4 Anatomy US normal  Prenatal Transfer Tool  Maternal Diabetes: Yes:  Diabetes Type:  Pre-pregnancy, Insulin/Medication controlled Genetic Screening: Normal Maternal Ultrasounds/Referrals: Normal Fetal Ultrasounds or other Referrals:  Fetal echo Maternal Substance Abuse:  No Significant Maternal Medications:  Meds include: Syntroid, insulin Significant Maternal Lab Results: Other: GBS unknown  Results for orders placed or performed during the hospital encounter of 12/05/20 (from the past 24 hour(s))  Comprehensive metabolic panel   Collection Time: 12/05/20  1:34 PM  Result Value Ref Range   Sodium 136 135 - 145 mmol/L   Potassium 4.4 3.5 - 5.1 mmol/L   Chloride 110 98 - 111 mmol/L   CO2 18 (L) 22 - 32 mmol/L   Glucose, Bld 165 (H) 70 - 99 mg/dL   BUN 26 (H) 6 - 20 mg/dL   Creatinine, Ser 1.62 (H) 0.44 - 1.00 mg/dL   Calcium 8.6 (L) 8.9 - 10.3 mg/dL   Total Protein 5.7 (L) 6.5 - 8.1 g/dL   Albumin 2.1 (L) 3.5 - 5.0 g/dL   AST 23 15 - 41 U/L   ALT 23 0 - 44 U/L   Alkaline Phosphatase 189 (H) 38 - 126 U/L   Total Bilirubin 0.3 0.3 - 1.2 mg/dL   GFR, Estimated 41 (L) >60 mL/min   Anion gap 8 5 - 15  CBC   Collection Time: 12/05/20  1:34 PM  Result Value Ref  Range   WBC 9.4 4.0 - 10.5 K/uL   RBC 3.84 (L) 3.87 - 5.11 MIL/uL   Hemoglobin 11.8 (L) 12.0 - 15.0 g/dL   HCT 34.3 (L) 36.0 - 46.0 %   MCV 89.3 80.0 - 100.0 fL   MCH 30.7 26.0 - 34.0 pg   MCHC 34.4 30.0 - 36.0 g/dL   RDW 12.5 11.5 - 15.5 %  Platelets 178 150 - 400 K/uL   nRBC 0.0 0.0 - 0.2 %    Patient Active Problem List   Diagnosis Date Noted   Severe preeclampsia 12/05/2020   Gestational hypertension, third trimester 11/14/2020   Marginal insertion of umbilical cord affecting management of mother 08/14/2020   Proteinuria affecting pregnancy in first trimester 06/13/2020   Kidney stone complicating pregnancy, first trimester 06/06/2020   Supervision of high risk pregnancy, antepartum 05/31/2020   AMA (advanced maternal age) multigravida 35+ 05/31/2020   Preexisting diabetes complicating pregnancy, antepartum 05/31/2020   Language barrier 05/31/2020   Type 2 diabetes mellitus with diabetic polyneuropathy, with long-term current use of insulin ([place]) 06/27/2017   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 G2P1001 at 53w4dhere for presenting for IOL for pre-eclampsia with severe features.   #Labor: Will start induction with cervical ripening with buccal cytotec. Plan for possible later foley balloon and to continue cytotec until ready for pitocin. #Pain: Planning for epidural #FWB: Category I #ID:  GBS unknown; prophylactic PCN and pcr ordered #MOF: breast #MOC: desires pp Liletta #Circ:  Yes  #Type 2 DM, insulin-dependent:  Took morning basal insulin this morning. Home regimen is basal dose of detemir 10 units AM / 52 units PM as well as bolus novalog 8 units with meals. Pre pregnancy regimen Lantus 15U AM and 60U PM.  - Will start endotool for gylcemic control during induction. - will plan to switch to pre pregnancy regimen after delivery  #Pre-eclampsia with Severe Features:  Labetalol protocol for severe-range pressures. - s/p 2g Mg bolus and now on Mg 1/hr  #Hypothyroidism, acquired:  - Continue home dose of levothyroxine 175 mcg daily - post delivery switch to  pre-pregnancy dose 1538m  AuAraceli BoucheMD  12/05/2020, 3:23 PM  GME ATTESTATION:  I saw and evaluated the patient. I agree with the findings and the plan of care as documented in the resident's note and have made all necessary edits.  AnRenard MatterMD, MPH OB Fellow, [place]1/14/2022 6:56 PM

## 2020-12-05 NOTE — Progress Notes (Signed)
Labor Progress Note Audrey Mcmillan is a 40 y.o. G2P1001 at [redacted]w[redacted]d who presented for IOL due to pre-eclampsia with severe features.   S: Doing well. No concerns at this time.   O:  BP (!) 146/75   Pulse 73   Temp 98.8 F (37.1 C) (Oral)   Resp 18   Ht 5\' 4"  (1.626 m)   Wt 83 kg   LMP 02/11/2020 (Approximate)   SpO2 99%   BMI 31.39 kg/m   EFM: Baseline 125 bpm, moderate variability, + accels, no decels  Toco: Contractions every 2-5 minutes   CVE: Dilation: 1 Effacement (%): Thick Cervical Position: Posterior Station: Ballotable Presentation: Vertex Exam by:: Dr. Gwenlyn Perking   A&P: 40 y.o. G2P1001 [redacted]w[redacted]d   #Labor: Progressing. Attempted foley balloon placement this check and unsuccessful. Will give 2nd dose of buccal Cytotec and reassess in 4 hours. Will re-attempt FB placement as able.  #Pain: PRN #FWB: Cat 1  #GBS positive > PCN  #Pre-eclampsia with severe features: Continues to have intermittent severe range pressures, though improving overall. Added Labetalol 100 mg BID to assist with BP control. On Mag. Will continue to monitor. Will repeat CBC, CMP, mag level in AM.   #T2DM: CBGs at goal. Continue Endotool.   [name], MD 11:48 PM

## 2020-12-05 NOTE — Progress Notes (Signed)
Pt informed that the ultrasound is considered a limited OB ultrasound and is not intended to be a complete ultrasound exam.  Patient also informed that the ultrasound is not being completed with the intent of assessing for fetal or placental anomalies or any pelvic abnormalities.  Explained that the purpose of today's ultrasound is to assess for presentation, BPP and amniotic fluid volume.  Patient acknowledges the purpose of the exam and the limitations of the study.    Pt reports that she had elevated BP 206/105 w/headache and dizziness on 11/11.  She did not go to hospital because she did want to drive while dizzy.  Today she has mild H/A (pain scale 3) and denies all visual disturbances. Pt status discussed w/Dr. Rip Harbour.  She was advised of the dangers of extremely high BP and was instructed to go to [facility]U immediately for pre-eclampsia evaluation. Pt voiced understanding.

## 2020-12-05 NOTE — Progress Notes (Signed)
1555-Patient off EFM and taken to L&D 212 via WC.Report given to Betsy Pries RN

## 2020-12-06 LAB — COMPREHENSIVE METABOLIC PANEL
ALT: 21 U/L (ref 0–44)
AST: 21 U/L (ref 15–41)
Albumin: 2 g/dL — ABNORMAL LOW (ref 3.5–5.0)
Alkaline Phosphatase: 178 U/L — ABNORMAL HIGH (ref 38–126)
Anion gap: 7 (ref 5–15)
BUN: 23 mg/dL — ABNORMAL HIGH (ref 6–20)
CO2: 14 mmol/L — ABNORMAL LOW (ref 22–32)
Calcium: 8.2 mg/dL — ABNORMAL LOW (ref 8.9–10.3)
Chloride: 107 mmol/L (ref 98–111)
Creatinine, Ser: 1.25 mg/dL — ABNORMAL HIGH (ref 0.44–1.00)
GFR, Estimated: 56 mL/min — ABNORMAL LOW (ref >60)
Glucose, Bld: 97 mg/dL (ref 70–99)
Potassium: 4.2 mmol/L (ref 3.5–5.1)
Sodium: 128 mmol/L — ABNORMAL LOW (ref 135–145)
Total Bilirubin: 0.5 mg/dL (ref 0.3–1.2)
Total Protein: 5.4 g/dL — ABNORMAL LOW (ref 6.5–8.1)

## 2020-12-06 LAB — CBC
HCT: 33.4 % — ABNORMAL LOW (ref 36.0–46.0)
Hemoglobin: 11.2 g/dL — ABNORMAL LOW (ref 12.0–15.0)
MCH: 30.7 pg (ref 26.0–34.0)
MCHC: 33.5 g/dL (ref 30.0–36.0)
MCV: 91.5 fL (ref 80.0–100.0)
Platelets: 197 10*3/uL (ref 150–400)
RBC: 3.65 MIL/uL — ABNORMAL LOW (ref 3.87–5.11)
RDW: 12.8 % (ref 11.5–15.5)
WBC: 12 10*3/uL — ABNORMAL HIGH (ref 4.0–10.5)
nRBC: 0 % (ref 0.0–0.2)

## 2020-12-06 LAB — GLUCOSE, CAPILLARY
Glucose-Capillary: 102 mg/dL — ABNORMAL HIGH (ref 70–99)
Glucose-Capillary: 105 mg/dL — ABNORMAL HIGH (ref 70–99)
Glucose-Capillary: 106 mg/dL — ABNORMAL HIGH (ref 70–99)
Glucose-Capillary: 108 mg/dL — ABNORMAL HIGH (ref 70–99)
Glucose-Capillary: 108 mg/dL — ABNORMAL HIGH (ref 70–99)
Glucose-Capillary: 110 mg/dL — ABNORMAL HIGH (ref 70–99)
Glucose-Capillary: 112 mg/dL — ABNORMAL HIGH (ref 70–99)
Glucose-Capillary: 115 mg/dL — ABNORMAL HIGH (ref 70–99)
Glucose-Capillary: 129 mg/dL — ABNORMAL HIGH (ref 70–99)
Glucose-Capillary: 137 mg/dL — ABNORMAL HIGH (ref 70–99)
Glucose-Capillary: 141 mg/dL — ABNORMAL HIGH (ref 70–99)
Glucose-Capillary: 145 mg/dL — ABNORMAL HIGH (ref 70–99)
Glucose-Capillary: 149 mg/dL — ABNORMAL HIGH (ref 70–99)
Glucose-Capillary: 151 mg/dL — ABNORMAL HIGH (ref 70–99)
Glucose-Capillary: 156 mg/dL — ABNORMAL HIGH (ref 70–99)
Glucose-Capillary: 162 mg/dL — ABNORMAL HIGH (ref 70–99)
Glucose-Capillary: 174 mg/dL — ABNORMAL HIGH (ref 70–99)
Glucose-Capillary: 174 mg/dL — ABNORMAL HIGH (ref 70–99)
Glucose-Capillary: 89 mg/dL (ref 70–99)
Glucose-Capillary: 90 mg/dL (ref 70–99)
Glucose-Capillary: 90 mg/dL (ref 70–99)
Glucose-Capillary: 92 mg/dL (ref 70–99)
Glucose-Capillary: 96 mg/dL (ref 70–99)

## 2020-12-06 LAB — GC/CHLAMYDIA PROBE AMP (~~LOC~~) NOT AT ARMC
Chlamydia: NEGATIVE
Comment: NEGATIVE
Comment: NORMAL
Neisseria Gonorrhea: NEGATIVE

## 2020-12-06 LAB — RPR: RPR Ser Ql: NONREACTIVE

## 2020-12-06 LAB — MAGNESIUM: Magnesium: 4.7 mg/dL — ABNORMAL HIGH (ref 1.7–2.4)

## 2020-12-06 MED ORDER — OXYTOCIN-SODIUM CHLORIDE 30-0.9 UT/500ML-% IV SOLN
1.0000 m[IU]/min | INTRAVENOUS | Status: DC
  Administered 2020-12-06 (×2): 2 m[IU]/min via INTRAVENOUS

## 2020-12-06 MED ORDER — LABETALOL HCL 200 MG PO TABS
200.0000 mg | ORAL_TABLET | Freq: Two times a day (BID) | ORAL | Status: DC
  Administered 2020-12-06 (×2): 200 mg via ORAL
  Filled 2020-12-06 (×2): qty 1

## 2020-12-06 MED ORDER — LEVONORGESTREL 20.1 MCG/DAY IU IUD
1.0000 | INTRAUTERINE_SYSTEM | Freq: Once | INTRAUTERINE | Status: DC
  Filled 2020-12-06: qty 1

## 2020-12-06 NOTE — Progress Notes (Signed)
Labor Progress Note Audrey Mcmillan is a 40 y.o. G2P1001 at [redacted]w[redacted]d presented for IOL for preeclampsia with severe features  S:  CNM called to bedside for deceleration. Pitocin stopped with spontaneous resolution of FHR  O:  BP 137/63 (BP Location: Left Arm)   Pulse 76   Temp 98.9 F (37.2 C) (Oral)   Resp 14   Ht 5\' 4"  (1.626 m)   Wt 83 kg   LMP 02/11/2020 (Approximate)   SpO2 99%   BMI 31.39 kg/m    Fetal Tracing:  Baseline: 120 Variability: moderate Accels: 15x15 Decels: prolonged  Toco: 2-4   CVE: Dilation: 4.5 Effacement (%): 60 Cervical Position: Posterior Station: -3 Presentation: Vertex Exam by:: Sharolyn Douglas   A&P: 40 y.o. G2P1001 [redacted]w[redacted]d IOL preeclampsia with severe features #Labor: Cervix unchanged. Deceleration likely related to short period of tachysystole. Will hold and restart with reassuring nst #Pain: per patient request #FWB: Cat 2 #GBS  unknown, PCN  Wende Mott, CNM 8:29 PM

## 2020-12-06 NOTE — Progress Notes (Signed)
Labor Progress Note Audrey Mcmillan is a 40 y.o. G2P1001 at [redacted]w[redacted]d presented for IOL due to pre-eclampsia with severe features.    S: Sleeping comfortably in bed  O:  BP 132/60   Pulse 76   Temp 98.1 F (36.7 C) (Oral)   Resp 18   Ht 5\' 4"  (1.626 m)   Wt 83 kg   LMP 02/11/2020 (Approximate)   SpO2 99%   BMI 31.39 kg/m  EFM: baseline 120/moderate variability/no decels, no accels  CVE: Dilation: 1 Effacement (%): 50 Cervical Position: Posterior Station: -2 Presentation: Vertex Exam by:: Dr Corky Sox   A&P: 40 y.o. G2P1001 [redacted]w[redacted]d presented for IOL due to pre-eclampsia with severe features.  #Labor: Progressing well. Effacement and station have changed. Will continue with cytotec.  #Pain: PRN #FWB: Cat I #GBS: beta strep culture in progress; PCN ordered as prophylaxis given preterm status  #Pre-eclampsia with severe features: Continued to have intermittent severe-range pressures overnight. Increased po labetalol to 200 mg BID. Will also have PRN. CBC, CMP, Mg levels reassuring. CMP with improving creatinine (1.25 from peak of 1.62).  #GDMA1: Recent CBG elevated at 142. Of note, patient had just eaten [employer] lunch with large soda. Continue Endotool.  Araceli Bouche, MD 12:52 PM

## 2020-12-06 NOTE — Progress Notes (Signed)
Labor Progress Note Meganne Rita is a 40 y.o. G2P1001 at [redacted]w[redacted]d presented for IOL due to pre-eclampsia with severe features.    S: Resting in bed, breathing through occasional contractions.  O:  BP (!) 156/76   Pulse 71   Temp 98.7 F (37.1 C) (Oral)   Resp 13   Ht 5\' 4"  (1.626 m)   Wt 83 kg   LMP 02/11/2020 (Approximate)   SpO2 99%   BMI 31.39 kg/m  EFM: baseline 120/moderate variability/ some accelerations present / no decels  CVE: Dilation: 4.5 Effacement (%): 60 Cervical Position: Posterior Station: -3 Presentation: Vertex Exam by:: Dr Corky Sox   A&P: 40 y.o. G2P1001 [redacted]w[redacted]d presented for IOL due to pre-eclampsia with severe features.  #Labor: Progressing well. AROM performed with consent of patient with moderate clear fluid. Baby tolerated well. Will start pitocin. #Pain: PRN #FWB: Cat I #GBS: beta strep culture in progress; PCN ordered as prophylaxis given preterm status  #Pre-eclampsia with severe features: No severe range pressures since last check. Continue labetalol 200 mg BID.   #GDMA1: Recent CBG elevated at 145, earlier peak of 174. Of note, patient ate [employer] sandwich prior to check.   Araceli Bouche, MD 4:54 PM

## 2020-12-06 NOTE — Progress Notes (Signed)
Meal brought by support person. [employer] cheeseburger, french fries and 20 oz soda.

## 2020-12-06 NOTE — Progress Notes (Signed)
Inpatient Diabetes Program Recommendations  AACE/ADA: New Consensus Statement on Inpatient Glycemic Control (2015)  Target Ranges:  Prepandial:   less than 140 mg/dL      Peak postprandial:   less than 180 mg/dL (1-2 hours)      Critically ill patients:  140 - 180 mg/dL   Lab Results  Component Value Date   GLUCAP 96 12/06/2020   HGBA1C 10.4 (A) 06/03/2020    Review of Glycemic Control Results for Audrey Mcmillan, Audrey Mcmillan (MRN [id number]) as of 12/06/2020 09:29  Ref. Range 12/06/2020 06:26 12/06/2020 08:25 12/06/2020 09:27  Glucose-Capillary Latest Ref Range: 70 - 99 mg/dL 106 (H) 89 96   Diabetes history: Type 2 DM Outpatient Diabetes medications: Novolog 8 units TID, Levemir 10 units QA, 52 units QP; Prior to preg: Lantus 60 units QHS, 30 units QAM, Novolog 6 units TID Current orders for Inpatient glycemic control: IV insulin  Inpatient Diabetes Program Recommendations:    Noted consult; in agreement with current orders.  Will plan to see patient when appropriate. Anticipate need for insulin at delivery.  With delivery of placenta, consider Semglee 20 units two hours prior to discontinuation of IV insulin along with Novolog 0-9 units TID & HS.   Thanks, Bronson Curb, MSN, RNC-OB Diabetes Coordinator [phone] (8a-5p)

## 2020-12-06 NOTE — Progress Notes (Signed)
Labor Progress Note Audrey Mcmillan is a 40 y.o. G2P1001 at [redacted]w[redacted]d who presented for IOL due to pre-eclampsia with severe features.   S: Doing well. No concerns at this time.   O:  BP (!) 149/60   Pulse 71   Temp 98.1 F (36.7 C)   Resp 17   Ht 5\' 4"  (1.626 m)   Wt 83 kg   LMP 02/11/2020 (Approximate)   SpO2 99%   BMI 31.39 kg/m   EFM: Baseline 125 bpm, moderate variability, + accels, no decels  Toco: Contractions every 2-5 minutes   CVE: Dilation: 1 Effacement (%): Thick Cervical Position: Posterior Station: Ballotable Presentation: Vertex Exam by:: Dr. Gwenlyn Perking and by myself. Cooks catheter placed with 60 cc inserted into the catheter. Pt tolerated this well.    A&P: 40 y.o. G2P1001 [redacted]w[redacted]d   #Labor: Progressing. Cooks catheter placed.  #Pain: PRN #FWB: Cat 1  #GBS positive > PCN  #Pre-eclampsia with severe features: Continues to have intermittent severe range pressures, though improving overall. Continue Labetalol 100 mg BID to assist with BP control. On Mag. Will repeat CBC, CMP, mag level in AM. We are monitoring her closely  #T2DM: CBGs at goal. Continue Endotool.   Radene Gunning, MD 4:19 AM

## 2020-12-07 ENCOUNTER — Encounter (HOSPITAL_COMMUNITY): Payer: Self-pay | Admitting: Obstetrics & Gynecology

## 2020-12-07 DIAGNOSIS — Z3A35 35 weeks gestation of pregnancy: Secondary | ICD-10-CM

## 2020-12-07 DIAGNOSIS — O99284 Endocrine, nutritional and metabolic diseases complicating childbirth: Secondary | ICD-10-CM

## 2020-12-07 DIAGNOSIS — O24424 Gestational diabetes mellitus in childbirth, insulin controlled: Secondary | ICD-10-CM

## 2020-12-07 DIAGNOSIS — O1414 Severe pre-eclampsia complicating childbirth: Secondary | ICD-10-CM

## 2020-12-07 LAB — CBC WITH DIFFERENTIAL/PLATELET
Abs Immature Granulocytes: 0.13 10*3/uL — ABNORMAL HIGH (ref 0.00–0.07)
Basophils Absolute: 0 10*3/uL (ref 0.0–0.1)
Basophils Relative: 0 %
Eosinophils Absolute: 0 10*3/uL (ref 0.0–0.5)
Eosinophils Relative: 0 %
HCT: 33.4 % — ABNORMAL LOW (ref 36.0–46.0)
Hemoglobin: 11.2 g/dL — ABNORMAL LOW (ref 12.0–15.0)
Immature Granulocytes: 1 %
Lymphocytes Relative: 10 %
Lymphs Abs: 1.9 10*3/uL (ref 0.7–4.0)
MCH: 30.4 pg (ref 26.0–34.0)
MCHC: 33.5 g/dL (ref 30.0–36.0)
MCV: 90.5 fL (ref 80.0–100.0)
Monocytes Absolute: 0.6 10*3/uL (ref 0.1–1.0)
Monocytes Relative: 3 %
Neutro Abs: 16.4 10*3/uL — ABNORMAL HIGH (ref 1.7–7.7)
Neutrophils Relative %: 86 %
Platelets: 205 10*3/uL (ref 150–400)
RBC: 3.69 MIL/uL — ABNORMAL LOW (ref 3.87–5.11)
RDW: 12.5 % (ref 11.5–15.5)
WBC: 19 10*3/uL — ABNORMAL HIGH (ref 4.0–10.5)
nRBC: 0 % (ref 0.0–0.2)

## 2020-12-07 LAB — GLUCOSE, CAPILLARY
Glucose-Capillary: 101 mg/dL — ABNORMAL HIGH (ref 70–99)
Glucose-Capillary: 102 mg/dL — ABNORMAL HIGH (ref 70–99)
Glucose-Capillary: 113 mg/dL — ABNORMAL HIGH (ref 70–99)
Glucose-Capillary: 116 mg/dL — ABNORMAL HIGH (ref 70–99)
Glucose-Capillary: 124 mg/dL — ABNORMAL HIGH (ref 70–99)
Glucose-Capillary: 141 mg/dL — ABNORMAL HIGH (ref 70–99)
Glucose-Capillary: 152 mg/dL — ABNORMAL HIGH (ref 70–99)
Glucose-Capillary: 166 mg/dL — ABNORMAL HIGH (ref 70–99)
Glucose-Capillary: 72 mg/dL (ref 70–99)

## 2020-12-07 LAB — COMPREHENSIVE METABOLIC PANEL
ALT: 20 U/L (ref 0–44)
AST: 22 U/L (ref 15–41)
Albumin: 2 g/dL — ABNORMAL LOW (ref 3.5–5.0)
Alkaline Phosphatase: 181 U/L — ABNORMAL HIGH (ref 38–126)
Anion gap: 7 (ref 5–15)
BUN: 19 mg/dL (ref 6–20)
CO2: 16 mmol/L — ABNORMAL LOW (ref 22–32)
Calcium: 8.2 mg/dL — ABNORMAL LOW (ref 8.9–10.3)
Chloride: 107 mmol/L (ref 98–111)
Creatinine, Ser: 1.21 mg/dL — ABNORMAL HIGH (ref 0.44–1.00)
GFR, Estimated: 58 mL/min — ABNORMAL LOW (ref >60)
Glucose, Bld: 125 mg/dL — ABNORMAL HIGH (ref 70–99)
Potassium: 4.8 mmol/L (ref 3.5–5.1)
Sodium: 130 mmol/L — ABNORMAL LOW (ref 135–145)
Total Bilirubin: 0.5 mg/dL (ref 0.3–1.2)
Total Protein: 5.7 g/dL — ABNORMAL LOW (ref 6.5–8.1)

## 2020-12-07 LAB — CULTURE, BETA STREP (GROUP B ONLY)

## 2020-12-07 LAB — MAGNESIUM: Magnesium: 5.2 mg/dL — ABNORMAL HIGH (ref 1.7–2.4)

## 2020-12-07 MED ORDER — TETANUS-DIPHTH-ACELL PERTUSSIS 5-2.5-18.5 LF-MCG/0.5 IM SUSY: 0.5000 mL | PREFILLED_SYRINGE | Freq: Once | INTRAMUSCULAR | Status: DC

## 2020-12-07 MED ORDER — LABETALOL HCL 5 MG/ML IV SOLN: 40.0000 mg | INTRAVENOUS | Status: DC | PRN

## 2020-12-07 MED ORDER — PRENATAL MULTIVITAMIN CH
1.0000 | ORAL_TABLET | Freq: Every day | ORAL | Status: DC
  Administered 2020-12-07 – 2020-12-09 (×3): 1 via ORAL
  Filled 2020-12-07 (×3): qty 1

## 2020-12-07 MED ORDER — LACTATED RINGERS IV SOLN: INTRAVENOUS | Status: DC

## 2020-12-07 MED ORDER — MAGNESIUM SULFATE 40 GM/1000ML IV SOLN: 1.0000 g/h | INTRAVENOUS | Status: DC

## 2020-12-07 MED ORDER — LABETALOL HCL 5 MG/ML IV SOLN
20.0000 mg | INTRAVENOUS | Status: DC | PRN
  Administered 2020-12-07 – 2020-12-09 (×3): 20 mg via INTRAVENOUS
  Filled 2020-12-07 (×3): qty 4

## 2020-12-07 MED ORDER — IBUPROFEN 600 MG PO TABS
600.0000 mg | ORAL_TABLET | Freq: Four times a day (QID) | ORAL | Status: DC
  Administered 2020-12-07 – 2020-12-09 (×8): 600 mg via ORAL
  Filled 2020-12-07 (×8): qty 1

## 2020-12-07 MED ORDER — FENTANYL CITRATE (PF) 100 MCG/2ML IJ SOLN
50.0000 ug | INTRAMUSCULAR | Status: DC | PRN
  Administered 2020-12-07: 50 ug via INTRAVENOUS
  Filled 2020-12-07: qty 2

## 2020-12-07 MED ORDER — ACETAMINOPHEN 325 MG PO TABS
650.0000 mg | ORAL_TABLET | ORAL | Status: DC | PRN
  Administered 2020-12-09: 650 mg via ORAL
  Filled 2020-12-07: qty 2

## 2020-12-07 MED ORDER — MEASLES, MUMPS & RUBELLA VAC IJ SOLR: 0.5000 mL | Freq: Once | INTRAMUSCULAR | Status: DC

## 2020-12-07 MED ORDER — MAGNESIUM SULFATE 40 GM/1000ML IV SOLN
1.0000 g/h | INTRAVENOUS | Status: AC
  Administered 2020-12-07: 1 g/h via INTRAVENOUS

## 2020-12-07 MED ORDER — DIBUCAINE (PERIANAL) 1 % EX OINT: 1.0000 "application " | TOPICAL_OINTMENT | CUTANEOUS | Status: DC | PRN

## 2020-12-07 MED ORDER — COCONUT OIL OIL: 1.0000 "application " | TOPICAL_OIL | Status: DC | PRN

## 2020-12-07 MED ORDER — ONDANSETRON HCL 4 MG/2ML IJ SOLN: 4.0000 mg | INTRAMUSCULAR | Status: DC | PRN

## 2020-12-07 MED ORDER — DIPHENHYDRAMINE HCL 25 MG PO CAPS: 25.0000 mg | ORAL_CAPSULE | Freq: Four times a day (QID) | ORAL | Status: DC | PRN

## 2020-12-07 MED ORDER — SODIUM CHLORIDE 0.9 % IV SOLN: 250.0000 mL | INTRAVENOUS | Status: DC | PRN

## 2020-12-07 MED ORDER — HYDRALAZINE HCL 20 MG/ML IJ SOLN: 10.0000 mg | INTRAMUSCULAR | Status: DC | PRN

## 2020-12-07 MED ORDER — BENZOCAINE-MENTHOL 20-0.5 % EX AERO: 1.0000 "application " | INHALATION_SPRAY | CUTANEOUS | Status: DC | PRN

## 2020-12-07 MED ORDER — LABETALOL HCL 5 MG/ML IV SOLN: 80.0000 mg | INTRAVENOUS | Status: DC | PRN

## 2020-12-07 MED ORDER — INSULIN GLARGINE-YFGN 100 UNIT/ML ~~LOC~~ SOLN
10.0000 [IU] | SUBCUTANEOUS | Status: DC
  Administered 2020-12-07 – 2020-12-08 (×2): 10 [IU] via SUBCUTANEOUS
  Filled 2020-12-07 (×2): qty 0.1

## 2020-12-07 MED ORDER — INSULIN GLARGINE-YFGN 100 UNIT/ML ~~LOC~~ SOLN
45.0000 [IU] | Freq: Every day | SUBCUTANEOUS | Status: DC
  Administered 2020-12-07: 45 [IU] via SUBCUTANEOUS
  Filled 2020-12-07 (×3): qty 0.45

## 2020-12-07 MED ORDER — LISINOPRIL 10 MG PO TABS
10.0000 mg | ORAL_TABLET | Freq: Every day | ORAL | Status: DC
  Administered 2020-12-07: 10 mg via ORAL
  Filled 2020-12-07: qty 1

## 2020-12-07 MED ORDER — SODIUM CHLORIDE 0.9% FLUSH: 3.0000 mL | INTRAVENOUS | Status: DC | PRN

## 2020-12-07 MED ORDER — SODIUM CHLORIDE 0.9% FLUSH: 3.0000 mL | Freq: Two times a day (BID) | INTRAVENOUS | Status: DC

## 2020-12-07 MED ORDER — ONDANSETRON HCL 4 MG PO TABS: 4.0000 mg | ORAL_TABLET | ORAL | Status: DC | PRN

## 2020-12-07 MED ORDER — LABETALOL HCL 5 MG/ML IV SOLN
INTRAVENOUS | Status: AC
  Filled 2020-12-07: qty 4

## 2020-12-07 MED ORDER — SENNOSIDES-DOCUSATE SODIUM 8.6-50 MG PO TABS
2.0000 | ORAL_TABLET | ORAL | Status: DC
  Administered 2020-12-07 – 2020-12-09 (×3): 2 via ORAL
  Filled 2020-12-07 (×3): qty 2

## 2020-12-07 MED ORDER — ZOLPIDEM TARTRATE 5 MG PO TABS: 5.0000 mg | ORAL_TABLET | Freq: Every evening | ORAL | Status: DC | PRN

## 2020-12-07 MED ORDER — WITCH HAZEL-GLYCERIN EX PADS: 1.0000 "application " | MEDICATED_PAD | CUTANEOUS | Status: DC | PRN

## 2020-12-07 MED ORDER — SIMETHICONE 80 MG PO CHEW: 80.0000 mg | CHEWABLE_TABLET | ORAL | Status: DC | PRN

## 2020-12-07 MED ORDER — LEVOTHYROXINE SODIUM 150 MCG PO TABS
150.0000 ug | ORAL_TABLET | Freq: Every day | ORAL | Status: DC
  Administered 2020-12-07 – 2020-12-09 (×3): 150 ug via ORAL
  Filled 2020-12-07 (×3): qty 6
  Filled 2020-12-07 (×2): qty 1
  Filled 2020-12-07: qty 6

## 2020-12-07 NOTE — Progress Notes (Signed)
Patient screened out for psychosocial assessment since none of the following apply: °Psychosocial stressors documented in mother or baby's chart °Gestation less than 32 weeks °Code at delivery  °Infant with anomalies °Please contact the Clinical Social Worker if specific needs arise, by MOB's request, or if MOB scores greater than 9/yes to question 10 on Edinburgh Postpartum Depression Screen. ° °Terease Marcotte Boyd-Gilyard, MSW, LCSW °Clinical Social Work °[phone] °  °

## 2020-12-07 NOTE — Lactation Note (Signed)
This note was copied from a baby's chart. Lactation Consultation Note  Patient Name: Audrey Mcmillan TDVVO'H Date: 12/07/2020 Reason for consult: Infant < 6lbs;Maternal endocrine disorder;NICU baby;Late-preterm 34-36.6wks;Initial assessment Age:39 hours  Visited with mom of 53 hours old LPI NICU female, she's a P2 and experienced BF. LC assisted with hand expression and pumping, mom able to get small droplets of colostrum, praised her for her efforts.  Reviewed pumping schedule, expectations, pumping log, lactogenesis II and benefits of breastmilk for NICU babies.  Maternal Data Has patient been taught Hand Expression?: Yes Does the patient have breastfeeding experience prior to this delivery?: Yes How long did the patient breastfeed?: 18 months  Feeding Mother's Current Feeding Choice: Breast Milk and Formula   Lactation Tools Discussed/Used Tools: Pump;Flanges;Coconut oil Flange Size: 24 Breast pump type: Other (comment);Double-Electric Breast Pump Pump Education: Setup, frequency, and cleaning;Milk Storage Reason for Pumping: LPI in NICU Pumping frequency: q 3 hours (recommended) Pumped volume:  (droplets)  Interventions Interventions: Breast feeding basics reviewed;Skin to skin;Breast massage;Hand express;Coconut oil;DEBP;Education;"The NICU and Your Baby" book;LC Services brochure  Plan of care  Encouraged mom to start pumping consistently every 3 hours, at least 8 pumping sessions/24 hours Breast massage, hand expression and coconut oil were also encouraged prior pumping  FOB present and supportive. All questions and concerns answered, family to call NICU LC PRN.  Discharge Pump: DEBP WIC Program: Yes  Consult Status Consult Status: Follow-up Date: 12/07/20 Follow-up type: In-patient   Alaira Level [name] 12/07/2020, 6:01 PM

## 2020-12-07 NOTE — Progress Notes (Signed)
Inpatient Diabetes Program Recommendations  AACE/ADA: New Consensus Statement on Inpatient Glycemic Control (2015)  Target Ranges:  Prepandial:   less than 140 mg/dL      Peak postprandial:   less than 180 mg/dL (1-2 hours)      Critically ill patients:  140 - 180 mg/dL   Lab Results  Component Value Date   GLUCAP 116 (H) 12/07/2020   HGBA1C 10.4 (A) 06/03/2020    Review of Glycemic Control Results for Audrey Mcmillan, Audrey Mcmillan (MRN [id number]) as of 12/07/2020 09:38  Ref. Range 12/07/2020 03:08 12/07/2020 04:14 12/07/2020 05:22  Glucose-Capillary Latest Ref Range: 70 - 99 mg/dL 101 (H) 124 (H) 116 (H)   Diabetes history: Type 2 DM Outpatient Diabetes medications: Novolog 8 units TID, Levemir 10 units QA, 52 units QP; Prior to preg: Lantus 60 units QHS, 30 units QAM, Novolog 6 units TID Current orders for Inpatient glycemic control: Semglee 45 units QHS, 10 units QD   Inpatient Diabetes Program Recommendations:   Consider adding Novolog 0-9 units TID & HS.  Will place TOC for follow up with PCP on DM.  Thanks, Bronson Curb, MSN, RNC-OB Diabetes Coordinator [phone] (8a-5p)

## 2020-12-07 NOTE — Progress Notes (Signed)
Chaplain responded to Code Blue. Code Blue cancelled.  [place]

## 2020-12-07 NOTE — Discharge Summary (Signed)
Postpartum Discharge Summary  Date of Service updated 12/09/2020    Patient Name: Audrey Mcmillan DOB: [birth year] MRN: [id number]  Date of admission: 12/05/2020 Delivery date:12/07/2020  Delivering provider: Patriciaann Clan  Date of discharge: 12/09/2020  Admitting diagnosis: Severe preeclampsia [O14.10] Preeclampsia [O14.90] Intrauterine pregnancy: [redacted]w[redacted]d    Secondary diagnosis:  Active Problems:   Type 2 diabetes mellitus with diabetic polyneuropathy, with long-term current use of insulin ([place]arnet   Supervision of high risk pregnancy, antepartum   AMA (advanced maternal age) multigravida 35+   Language barrier   Proteinuria affecting pregnancy in first trimester   Marginal insertion of umbilical cord affecting management of mother   Severe preeclampsia   Preeclampsia  Additional problems: none    Discharge diagnosis: Preterm Pregnancy Delivered, Preeclampsia (severe), and Type 2 DM                                              Post partum procedures: none Augmentation: AROM, Pitocin, Cytotec, and IP Foley Complications: None  Hospital course: Induction of Labor With Vaginal Delivery   40y.o. yo G2P1001 at 374w6das admitted to the hospital 12/05/2020 for induction of labor.  Indication for induction: Preeclampsia.  Patient had an uncomplicated labor course as follows: Membrane Rupture Time/Date: 4:46 PM ,12/06/2020   Delivery Method:Vaginal, Spontaneous  Episiotomy: None  Lacerations:  Labial  Details of delivery can be found in separate delivery note.  Patient had a routine postpartum course. Patient is discharged home 12/09/20.  Newborn Data: Birth date:12/07/2020  Birth time:2:22 AM  Gender:Female  Living status:Living  Apgars:3 ,9  Weight:1928 g   Magnesium Sulfate received: Yes: Seizure prophylaxis BMZ received: No Rhophylac:No MMR:No T-DaP:Given prenatally Flu: No Transfusion:No  Physical exam  Vitals:   12/09/20 0402 12/09/20 0901 12/09/20  0911 12/09/20 0927  BP: 135/68 (!) 169/85 (!) 169/78 (!) 142/74  Pulse: 76 80 79 74  Resp: 18 18    Temp: 98.3 F (36.8 C) 97.7 F (36.5 C)    TempSrc: Oral Oral    SpO2: 98% 100%    Weight:      Height:       General: alert, cooperative, and no distress Lochia: appropriate Uterine Fundus: firm Incision: N/A DVT Evaluation: No evidence of DVT seen on physical exam. Labs: Lab Results  Component Value Date   WBC 19.0 (H) 12/07/2020   HGB 11.2 (L) 12/07/2020   HCT 33.4 (L) 12/07/2020   MCV 90.5 12/07/2020   PLT 205 12/07/2020   CMP Latest Ref Rng & Units 12/08/2020  Glucose 70 - 99 mg/dL 89  BUN 6 - 20 mg/dL 23(H)  Creatinine 0.44 - 1.00 mg/dL 1.35(H)  Sodium 135 - 145 mmol/L 133(L)  Potassium 3.5 - 5.1 mmol/L 4.7  Chloride 98 - 111 mmol/L 107  CO2 22 - 32 mmol/L 19(L)  Calcium 8.9 - 10.3 mg/dL 7.8(L)  Total Protein 6.5 - 8.1 g/dL -  Total Bilirubin 0.3 - 1.2 mg/dL -  Alkaline Phos 38 - 126 U/L -  AST 15 - 41 U/L -  ALT 0 - 44 U/L -   Edinburgh Score: Edinburgh Postnatal Depression Scale Screening Tool 12/08/2020  I have been able to laugh and see the funny side of things. 0  I have looked forward with enjoyment to things. 0  I have blamed myself unnecessarily when things  went wrong. 0  I have been anxious or worried for no good reason. 0  I have felt scared or panicky for no good reason. 0  Things have been getting on top of me. 0  I have been so unhappy that I have had difficulty sleeping. 0  I have felt sad or miserable. 0  I have been so unhappy that I have been crying. 0  The thought of harming myself has occurred to me. 0  Edinburgh Postnatal Depression Scale Total 0     After visit meds:  Allergies as of 12/09/2020   No Known Allergies      Medication List     STOP taking these medications    Levemir FlexTouch 100 UNIT/ML FlexPen Generic drug: insulin detemir   NovoLOG 100 UNIT/ML injection Generic drug: insulin aspart   NovoLOG FlexPen  100 UNIT/ML FlexPen Generic drug: insulin aspart       TAKE these medications    furosemide 20 MG tablet Commonly known as: Lasix Take 1 tablet (20 mg total) by mouth 2 (two) times daily.   glucose blood test strip Commonly known as: True Metrix Blood Glucose Test Use as instructed. Check blood glucose levels twice per day.   ibuprofen 600 MG tablet Commonly known as: ADVIL Take 1 tablet (600 mg total) by mouth every 6 (six) hours.   insulin glargine-yfgn 100 UNIT/ML injection Commonly known as: SEMGLEE Inject 0.15 mLs (15 Units total) into the skin at bedtime.   Insulin Syringe-Needle U-100 30G X 5/16" 1 ML Misc Inject 1 application into the skin 2 times daily at 12 noon and 4 pm.   levothyroxine 175 MCG tablet Commonly known as: SYNTHROID Tome 1 tableta (175 mcg en total) por va oral diariamente. (Take 1 tablet (175 mcg total) by mouth daily.)   NIFEdipine 30 MG 24 hr tablet Commonly known as: ADALAT CC Take 1 tablet (30 mg total) by mouth 2 (two) times daily.   prenatal vitamin w/FE, FA 27-1 MG Tabs tablet Take 1 tablet by mouth daily at 12 noon.   True Metrix Meter w/Device Kit Use as instructed   TRUEplus Lancets 28G Misc Use as instructed. Check blood glucose levels twice per day.         Discharge home in stable condition Infant Feeding: Breast Infant Disposition:NICU Discharge instruction: per After Visit Summary and Postpartum booklet. Activity: Advance as tolerated. Pelvic rest for 6 weeks.  Diet: carb modified diet Future Appointments: Future Appointments  Date Time Provider [place]  12/14/2020 10:00 AM [facility] NURSE [facility]  01/02/2021  2:10 PM Kerin Perna, NP [facility] None  01/18/2021  8:15 AM Clarnce Flock, MD [facility]   Follow up Visit:  [place] Follow up on 01/02/2021.   Why: PCP appointment December 12th  at 2:10 pm with Juluis Mire  NP. Contact information: [place] [id number] [phone]                Message sent to [facility] by Dr Higinio Plan on 11/16:   Please schedule this patient for a In person postpartum visit in 6 weeks with the following provider: MD. Additional Postpartum F/U:BP check 1 week  High risk pregnancy complicated by: HTN and J1HE Delivery mode:  Vaginal, Spontaneous  Anticipated Birth Control:  IUD >>She has no insurance, can we offer her the paperwork for the scholarship to hopefully get this processing prior to  her postpartum visit?    12/09/2020 Emeterio Reeve, MD

## 2020-12-07 NOTE — Care Management Note (Signed)
Case Management Note  Patient Details  Name: Ellory Khurana MRN: [id number] Date of Birth: Jan 09, 1981  Subjective/Objective:                  Maira Christon is a 40 y.o. G2P1001 s/p VD at [redacted]w[redacted]d. She was admitted for IOL due to pre-eclampsia with severe features.    Discharge planning Services  CM Consult, Follow-up appt scheduled   Additional Comments: CM talked to patient and she confirmed with CM that she was a patient at the [facility].CM made f/u appointment for patient ( see AVS).  Yong Channel, RN 12/07/2020, 10:10 AM

## 2020-12-07 NOTE — Progress Notes (Signed)
Labor Progress Note Suzy Kugel is a 40 y.o. G2P1001 at [redacted]w[redacted]d presented for IOL due to pre-eclampsia with severe features.   Presented to bedside for cervical check. Additionally noted recurrent late decelerations on the last several contractions. Cervix progressed, now about 6/80, with some suspected malpositioning of fetal head onto cervix. IUPC placed. She continued to have decelerations, thus pit was stopped, IVF bolus given, and did maternal position changes. Despite bilateral sides + hands/knees, continued recurrent decels. Given terb x1. FHT improving with hands and knees position. Baseline now around 115-120, previously 120. Dr. Elly Modena made aware of the above.   O:  BP (!) 156/78   Pulse 75   Temp 98.7 F (37.1 C) (Oral)   Resp 14   Ht 5\' 4"  (1.626 m)   Wt 83 kg   LMP 02/11/2020 (Approximate)   SpO2 99%   BMI 31.39 kg/m   Current FHT:  120/min/none/early   CVE: Dilation: 6 Effacement (%): 80 Cervical Position: Posterior Station: -2 Presentation: Vertex Exam by:: Dr. Higinio Plan   A&P: 40 y.o. G2P1001 [redacted]w[redacted]d  #Labor: See above. Low threshold for cervical recheck with continued position changes. Cont to hold pit.  #Pain: PRN  #FWB: Cat II, currently recovering  #GBS unknown, PCN   #Pre-e with SF: Based from severe range BP + Cr. Recent severe range BP, will receive labetalol. Cont Mg. Recheck labs at 0500.   #T2DM: Endotool in place. Recent CBG 102.    Patriciaann Clan, DO 2:01 AM

## 2020-12-08 ENCOUNTER — Other Ambulatory Visit (HOSPITAL_COMMUNITY): Payer: Self-pay

## 2020-12-08 LAB — BASIC METABOLIC PANEL
Anion gap: 7 (ref 5–15)
BUN: 23 mg/dL — ABNORMAL HIGH (ref 6–20)
CO2: 19 mmol/L — ABNORMAL LOW (ref 22–32)
Calcium: 7.8 mg/dL — ABNORMAL LOW (ref 8.9–10.3)
Chloride: 107 mmol/L (ref 98–111)
Creatinine, Ser: 1.35 mg/dL — ABNORMAL HIGH (ref 0.44–1.00)
GFR, Estimated: 51 mL/min — ABNORMAL LOW (ref >60)
Glucose, Bld: 89 mg/dL (ref 70–99)
Potassium: 4.7 mmol/L (ref 3.5–5.1)
Sodium: 133 mmol/L — ABNORMAL LOW (ref 135–145)

## 2020-12-08 LAB — SURGICAL PATHOLOGY

## 2020-12-08 LAB — GLUCOSE, CAPILLARY
Glucose-Capillary: 68 mg/dL — ABNORMAL LOW (ref 70–99)
Glucose-Capillary: 77 mg/dL (ref 70–99)
Glucose-Capillary: 63 mg/dL — ABNORMAL LOW (ref 70–99)
Glucose-Capillary: 83 mg/dL (ref 70–99)
Glucose-Capillary: 137 mg/dL — ABNORMAL HIGH (ref 70–99)
Glucose-Capillary: 146 mg/dL — ABNORMAL HIGH (ref 70–99)
Glucose-Capillary: 108 mg/dL — ABNORMAL HIGH (ref 70–99)

## 2020-12-08 MED ORDER — LISINOPRIL 10 MG PO TABS: 20.0000 mg | ORAL_TABLET | Freq: Every day | ORAL | Status: DC

## 2020-12-08 MED ORDER — INSULIN ASPART 100 UNIT/ML IJ SOLN
0.0000 [IU] | Freq: Three times a day (TID) | INTRAMUSCULAR | Status: DC
Start: 2020-12-08 — End: 2020-12-09
  Administered 2020-12-08: 20:00:00 1 [IU] via SUBCUTANEOUS

## 2020-12-08 MED ORDER — LISINOPRIL 10 MG PO TABS
10.0000 mg | ORAL_TABLET | Freq: Once | ORAL | Status: AC
  Administered 2020-12-08: 10 mg via ORAL
  Filled 2020-12-08: qty 1

## 2020-12-08 MED ORDER — NIFEDIPINE ER OSMOTIC RELEASE 30 MG PO TB24
30.0000 mg | ORAL_TABLET | Freq: Every day | ORAL | Status: DC
  Administered 2020-12-08: 09:00:00 30 mg via ORAL
  Filled 2020-12-08: qty 1

## 2020-12-08 MED ORDER — INSULIN GLARGINE-YFGN 100 UNIT/ML ~~LOC~~ SOLN
25.0000 [IU] | Freq: Every day | SUBCUTANEOUS | Status: DC
  Administered 2020-12-08: 25 [IU] via SUBCUTANEOUS
  Filled 2020-12-08 (×2): qty 0.25

## 2020-12-08 MED ORDER — NIFEDIPINE ER OSMOTIC RELEASE 30 MG PO TB24
30.0000 mg | ORAL_TABLET | Freq: Two times a day (BID) | ORAL | Status: DC
  Administered 2020-12-08 – 2020-12-09 (×2): 30 mg via ORAL
  Filled 2020-12-08 (×2): qty 1

## 2020-12-08 NOTE — Progress Notes (Signed)
  Progress Note   Date: 12/07/2020  Patient Name: Audrey Mcmillan        MRN#: [id number]  Review the patient's clinical findings supports the diagnosis of:   Hyponatremia     I will change the fluids to NaCl, thanks

## 2020-12-08 NOTE — Progress Notes (Addendum)
Inpatient Diabetes Program Recommendations  AACE/ADA: New Consensus Statement on Inpatient Glycemic Control (2015)  Target Ranges:  Prepandial:   less than 140 mg/dL      Peak postprandial:   less than 180 mg/dL (1-2 hours)      Critically ill patients:  140 - 180 mg/dL   Lab Results  Component Value Date   GLUCAP 83 12/08/2020   HGBA1C 10.4 (A) 06/03/2020    Review of Glycemic Control  Latest Reference Range & Units 12/07/20 16:25 12/07/20 20:12 12/07/20 22:39 12/08/20 06:37 12/08/20 07:16 12/08/20 08:43 12/08/20 09:15  Glucose-Capillary 70 - 99 mg/dL 166 (H) 152 (H) 141 (H) 68 (L) 77 63 (L) 83   Diabetes history: Type 2 DM Outpatient Diabetes medications: Novolog 8 units TID, Levemir 10 units QA, 52 units QP;  Prior to preg: Lantus 60 units QHS, 30 units QAM, Novolog 6 units TID Current orders for Inpatient glycemic control: Semglee 45 units QHS, 10 units QD Novolog sensitive tid with meals and HS Inpatient Diabetes Program Recommendations:    Please reduce Semglee to 25 units q HS.    Thanks,  Adah Perl, RN, BC-ADM Inpatient Diabetes Coordinator Pager [phone]  (8a-5p)  Addendum:  Spoke with patient regarding home glycemic control. She gets her medications from [facility].  Discussed importance of glycemic control and her reduced insulin needs after delivery.  Patient states that she has no further questions at this time.

## 2020-12-08 NOTE — Progress Notes (Signed)
Contact with Dr. Roselie Awkward in regards to severe range blood pressures. Informed DrRoselie Awkward patient had just received Procardia. Instructed to give patient 20 of Labetalol. Toya Smothers, RN

## 2020-12-08 NOTE — Care Management Note (Signed)
Case Management Note  Patient Details  Name: Audrey Mcmillan MRN: [id number] Date of Birth: Dec 19, 1980  Subjective/Objective:                    In-House Referral:  diabetic coordinator   Discharge planning Services  CM Consult, Follow-up appt scheduled, MATCH Program   Additional Comments: CM placed MATCH in system and notified MD to send scripts to [facility] prior to discharge or tomorrow for patient to receive home medications. Patient has no insurance.    Rosita Fire RNC-MNN, BSN Transitions of Care Pediatrics/Women's and [place]  12/08/2020, 10:13 AM

## 2020-12-08 NOTE — Progress Notes (Signed)
Post Partum Day 1  Patient is Spanish-speaking only, interpreter present for this encounter.  Subjective: No complaints, up ad lib, voiding, and tolerating PO. Pain well controlled. Patient denies any headaches, visual symptoms, RUQ/epigastric pain or other concerning symptoms.  Baby is stable in NICU.  Objective: Blood pressure (!) 156/59, pulse 67, temperature 98 F (36.7 C), temperature source Oral, resp. rate 16, height 5\' 4"  (1.626 m), weight 83 kg, last menstrual period 02/11/2020, SpO2 100 %, unknown if currently breastfeeding.  Physical Exam:  General: alert and no distress Lochia: appropriate Uterine Fundus: firm, NT DVT Evaluation: No evidence of DVT seen on physical exam. Negative Homan's sign. No cords or calf tenderness.  Recent Labs    12/06/20 0543 12/07/20 0446  HGB 11.2* 11.2*  HCT 33.4* 33.4*    Assessment/Plan: Lisinopril increased to 20 mg po daily, will continue to titrate as needed for BP control. Continue on insulin regimen as per DM coordinator, appreciate their recommendations Continue routine postpartum care.   LOS: 3 days   Verita Schneiders, MD 12/08/2020, 7:41 AM

## 2020-12-09 ENCOUNTER — Other Ambulatory Visit (HOSPITAL_COMMUNITY): Payer: Self-pay

## 2020-12-09 LAB — GLUCOSE, CAPILLARY
Glucose-Capillary: 56 mg/dL — ABNORMAL LOW (ref 70–99)
Glucose-Capillary: 104 mg/dL — ABNORMAL HIGH (ref 70–99)

## 2020-12-09 MED ORDER — FUROSEMIDE 40 MG PO TABS
20.0000 mg | ORAL_TABLET | Freq: Every day | ORAL | Status: DC
  Administered 2020-12-09: 20 mg via ORAL
  Filled 2020-12-09: qty 1

## 2020-12-09 MED ORDER — INSULIN GLARGINE 100 UNIT/ML SOLOSTAR PEN
15.0000 [IU] | PEN_INJECTOR | Freq: Every day | SUBCUTANEOUS | 11 refills | Status: DC
  Filled 2020-12-09: qty 6, 34d supply, fill #0

## 2020-12-09 MED ORDER — FUROSEMIDE 20 MG PO TABS
20.0000 mg | ORAL_TABLET | Freq: Two times a day (BID) | ORAL | 0 refills | Status: DC
Start: 2020-12-09 — End: 2020-12-14
  Filled 2020-12-09: qty 8, 4d supply, fill #0

## 2020-12-09 MED ORDER — IBUPROFEN 600 MG PO TABS
600.0000 mg | ORAL_TABLET | Freq: Four times a day (QID) | ORAL | 0 refills | Status: DC
  Filled 2020-12-09: qty 30, 8d supply, fill #0

## 2020-12-09 MED ORDER — NIFEDIPINE ER 30 MG PO TB24
30.0000 mg | ORAL_TABLET | Freq: Two times a day (BID) | ORAL | 1 refills | Status: DC
  Filled 2020-12-09: qty 60, 30d supply, fill #0

## 2020-12-09 MED ORDER — INSULIN PEN NEEDLE 32G X 4 MM MISC
3 refills | Status: DC
  Filled 2020-12-09: qty 100, 25d supply, fill #0

## 2020-12-09 NOTE — Progress Notes (Signed)
Discharge instructions and prescriptions given to pt. Discussed post vaginal delivery care, signs and symptoms to report to the MD, upcoming appointments, and meds.Pt verbalizes understanding and has no questions or concerns at this time. Pt discharged home from hospital in stable condition. 

## 2020-12-09 NOTE — Progress Notes (Addendum)
Hypoglycemic Event  CBG: 56  Treatment: pt received breakfast tray  Symptoms: headache, dizziness  Follow-up CBG: Time: 0946 CBG Result: 104  Possible Reasons for Event: unknown  Dr. Roselie Awkward at bedside. No new orders received at this time.    Letitia Neri

## 2020-12-09 NOTE — Progress Notes (Signed)
Post Partum Day 2 Subjective: no complaints  Objective: Blood pressure (!) 169/78, pulse 79, temperature 97.7 F (36.5 C), temperature source Oral, resp. rate 18, height 5\' 4"  (1.626 m), weight 83 kg, last menstrual period 02/11/2020, SpO2 100 %, unknown if currently breastfeeding.  Physical Exam:  General: alert, cooperative, and no distress Lochia: appropriate Uterine Fundus: firm DVT Evaluation: No evidence of DVT seen on physical exam.  Recent Labs    12/07/20 0446  HGB 11.2*  HCT 33.4*   CBG (last 3)  Recent Labs    12/08/20 1920 12/08/20 2328 12/09/20 0907  GLUCAP 146* 108* 56*    Assessment/Plan: Reassess BP  and  BG management before discharge   LOS: 4 days   Emeterio Reeve 12/09/2020, 9:15 AM

## 2020-12-09 NOTE — Lactation Note (Signed)
This note was copied from a baby's chart.  NICU Lactation Consultation Note  Patient Name: Boy Krissy Orebaugh [id number] Date: 12/09/2020 Age:39 hours   Subjective Reason for consult: Follow-up assessment Mother is pumping but is vague about the frequency. She denies breast changes today. She denies pain or difficulty using pump. Mother's blood sugar was low when LC arrived and she felt unwell; RN was present.   Pt. has connected with WIC and plans to pick up a pump.  Pt is aware of [facility] services for further assistance today prn.   Objective Infant data: Mother's Current Feeding Choice: Breast Milk and Donor Milk  Infant feeding assessment Scale for Readiness: 2 Scale for Quality: 3    Maternal data: [id number]  Vaginal, Spontaneous  Assessment Maternal: Mother has not experienced onset of lactogenesis II at this time.   Intervention/Plan Interventions: Education (interpreter services)  Plan: Consult Status: Follow-up  NICU Follow-up type: Verify onset of copious milk; Verify absence of engorgement (verify pump issuance from [facility])  Mother to pump q3. Mother to pickup pump at [facility].  Gwynne Edinger 12/09/2020, 9:49 AM

## 2020-12-09 NOTE — Progress Notes (Signed)
Inpatient Diabetes Program Recommendations  AACE/ADA: New Consensus Statement on Inpatient Glycemic Control (2015)  Target Ranges:  Prepandial:   less than 140 mg/dL      Peak postprandial:   less than 180 mg/dL (1-2 hours)      Critically ill patients:  140 - 180 mg/dL   Lab Results  Component Value Date   GLUCAP 104 (H) 12/09/2020   HGBA1C 10.4 (A) 06/03/2020    Review of Glycemic Control  Latest Reference Range & Units 12/08/20 15:55 12/08/20 19:20 12/08/20 23:28 12/09/20 09:07 12/09/20 09:46  Glucose-Capillary 70 - 99 mg/dL 137 (H) 146 (H) 108 (H) 56 (L) 104 (H)  Diabetes history: Type 2 DM Outpatient Diabetes medications: Novolog 8 units TID, Levemir 10 units QA, 52 units QP;  Prior to preg: Lantus 60 units QHS, 30 units QAM, Novolog 6 units TID Current orders for Inpatient glycemic control: Semglee 25 units q HS Novolog sensitive tid with meals and HS Inpatient Diabetes Program Recommendations:    Consider reducing Semglee to 20 units q HS.   Thanks,  Adah Perl, RN, BC-ADM Inpatient Diabetes Coordinator Pager [phone]  (8a-5p)

## 2020-12-10 ENCOUNTER — Ambulatory Visit: Payer: Self-pay

## 2020-12-10 NOTE — Lactation Note (Signed)
This note was copied from a baby's chart.  NICU Lactation Consultation Note  Patient Name: Audrey Mcmillan [id number] Date: 12/10/2020 Age:39 hours   Subjective Reason for consult: Follow-up assessment Mother has initiated bf'ing but has not pumped since yesterday. She endorses breast changes today but denies pain.   Objective Infant data: Mother's Current Feeding Choice: Breast Milk and Donor Milk  Infant feeding assessment Scale for Readiness: 1 Scale for Quality: 2    Maternal data: [id number]  Vaginal, Spontaneous  Pumping frequency: no pumping today   Assessment Maternal: Mother's breasts are full and she is trending toward engorgement.   Intervention/Plan Interventions: Education  Tools: Pump (ice packs)  Plan: Consult Status: Follow-up  NICU Follow-up type: Verify absence of engorgement; Verify onset of copious milk  Mother to use ice packs before pumping and pump q3.  Mother will request further LC support today prn.    Gwynne Edinger 12/10/2020, 9:40 AM

## 2020-12-11 ENCOUNTER — Ambulatory Visit: Payer: Self-pay

## 2020-12-11 NOTE — Lactation Note (Signed)
This note was copied from a baby's chart. Lactation Consultation Note  Patient Name: Audrey Mcmillan [id number] Date: 12/11/2020 Reason for consult: Follow-up assessment;Late-preterm 34-36.6wks;NICU baby;Other (Comment);Infant < 6lbs;Maternal endocrine disorder (baby's discharge, AMA, first child is 40 y.o) Age:31 days  Visited with mom of 64 days old LPI NICU female, she's a P2 and reports the full onset of lactogenesis II. Parents are taking baby home today, reviewed discharge education, pumping schedule, lactogenesis II/III and supply/demand.  Mom plans to P/U her pump from the [facility] office tomorrow, offered a [place] since today is Sunday but mom politely declined and voiced she'd just rather wait for tomorrow. LC provided a hand pump for home use; mom understands the importance of pumping after feedings at the breast to protect her supply.  Maternal Data  Mom's supply is in and WNL  Feeding Mother's Current Feeding Choice: Breast Milk and Donor Milk Nipple Type: Nfant Slow Flow (purple)  Lactation Tools Discussed/Used Tools: Pump;Flanges Flange Size: 24 Breast pump type: Double-Electric Breast Pump;Manual Pump Education: Setup, frequency, and cleaning;Milk Storage Reason for Pumping: LPI in NICU Pumping frequency: 2-3 times/24 hours Pumped volume: 90 mL  Interventions Interventions: Breast feeding basics reviewed;DEBP;Education;Hand pump  Plan of care   Encouraged mom to start pumping consistently every 3 hours after feedings at the breast, at least 8 pumping sessions/24 hours She'll continue putting baby to breast on feeding cues and supplementing with bottles as instructed by provider   FOB present and supportive. All questions and concerns answered, family to call NICU LC PRN.  Discharge Pump: DEBP;Manual  Consult Status Consult Status: Follow-up Date: 12/11/20 Follow-up type: In-patient   Audrey Mcmillan 12/11/2020, 4:15 PM

## 2020-12-13 ENCOUNTER — Telehealth: Payer: Self-pay

## 2020-12-13 ENCOUNTER — Other Ambulatory Visit: Payer: Self-pay

## 2020-12-13 NOTE — Telephone Encounter (Signed)
Transition Care Management Follow-up Telephone Call   Date of discharge and from where:[facility] on 12/10/2018 How have you been since you were released from the hospital? Feeling good both patient and the baby Any questions or concerns? No questions/concerns reported.  Items Reviewed: Did the pt receive and understand the discharge instructions provided? Has the instructions and have no questions.  Medications obtained and verified? She said that she have the medication list  and the hospital staff reviewed them in detail prior to discharge. She said that she has all of the medications and they have no questions.  Any new allergies since your discharge? None reported  Do you have support at home? No lives by herself, has friends  Other (ie: DME, Home Health, etc)       N/A Functional Questionnaire: (I = Independent and D = Dependent) ADL's:  Independent.        Follow up appointments reviewed:   PCP Hospital f/u appt confirmed?NP Oletta Lamas 01/02/2021   Specialist Hospital f/u appt confirmed? scheduled at this time  Are transportation arrangements needed? have transportation   If their condition worsens, is the pt aware to call  their PCP or go to the ED? Yes.Made pt aware if condition worsen or start experiencing rapid weight gain, chest pain, diff breathing, SOB, high fevers, or bleading to refer imediately to ED for further evaluation.  Was the patient provided with contact information for the PCP's office or ED? He has the phone number  Was the pt encouraged to call back with questions or concerns?yes

## 2020-12-14 ENCOUNTER — Encounter: Payer: Self-pay | Admitting: General Practice

## 2020-12-14 ENCOUNTER — Other Ambulatory Visit: Payer: Self-pay

## 2020-12-14 ENCOUNTER — Ambulatory Visit (INDEPENDENT_AMBULATORY_CARE_PROVIDER_SITE_OTHER): Payer: Self-pay | Admitting: General Practice

## 2020-12-14 ENCOUNTER — Encounter: Payer: Self-pay | Admitting: Family Medicine

## 2020-12-14 VITALS — BP 139/82 | HR 81 | Ht 64.0 in | Wt 163.0 lb

## 2020-12-14 DIAGNOSIS — Z0130 Encounter for examination of blood pressure without abnormal findings: Secondary | ICD-10-CM

## 2020-12-14 DIAGNOSIS — O165 Unspecified maternal hypertension, complicating the puerperium: Secondary | ICD-10-CM

## 2020-12-14 MED ORDER — NIFEDIPINE ER OSMOTIC RELEASE 60 MG PO TB24
60.0000 mg | ORAL_TABLET | Freq: Two times a day (BID) | ORAL | 1 refills | Status: DC
  Filled 2020-12-14: qty 60, 30d supply, fill #0

## 2020-12-14 NOTE — Progress Notes (Addendum)
Patient presents to office today for BP check following vaginal delivery on 11/16. Her history is significant for GHTN & Pre-E. Patient denies headaches, dizziness or blurry vision. She is taking nifedipine 30mg  BID daily. BP today 149/81 & 139/82. She does report some normal blood pressures at home. Discussed with Dr Ernestina Patches who recommends patient increase nifedipine 60mg  BID daily. Patient should return in 1 week for repeat BP check. Advised patient if she experiences dizziness or light headedness after taking the new dose over the next couple of days to go back to nifedipine 30mg  BID. Patient verbalized understanding to all.  Koren Bound RN BSN 12/14/20

## 2020-12-14 NOTE — Progress Notes (Signed)
Attestation of Attending Supervision of clinical support staff: I agree with the care provided to this patient and was available for any consultation.  I have reviewed the RN's note and chart. I was consulted and recommended increasing the patient's procardia. We will check her BP next week.   Lauretta Chester MD MPH Attending [place] for [facility]

## 2020-12-14 NOTE — Progress Notes (Signed)
Visited with patient while she was in the office for BP check. She has concerns with low milk supply. Infant born at 69 weeks 6 days.   Mom reports she has a Symphony pump from [facility]. She reports she is pumping occasionally and not getting any milk. She has pumped 2 times in the last 24 hours. She reports she has felt full once.  Reviewed supply and demand and importance of emptying the breast every 2-3 hours or 8-10 times a day for 15-20 minutes to promote milk supply. Reviewed the sooner she starts regular pumping the better.   Reviewed allowing infant to nuzzle at the breast if he will and that he may not be willing to breast feed until closer to term.   Offer OP Lactation appointment, she declined at this time. She has phone number to call and schedule Lactation appointment if she would like to do so.   Patient with no other questions or concerns at this time.

## 2020-12-20 ENCOUNTER — Telehealth (HOSPITAL_COMMUNITY): Payer: Self-pay | Admitting: *Deleted

## 2020-12-20 NOTE — Telephone Encounter (Signed)
Phone voicemail message left by interpreter to return nurse call if mom has any concerns about herself or baby.  Odis Hollingshead, RN 12-20-2020 4:06pm

## 2020-12-21 ENCOUNTER — Ambulatory Visit: Payer: Self-pay

## 2020-12-26 ENCOUNTER — Ambulatory Visit (INDEPENDENT_AMBULATORY_CARE_PROVIDER_SITE_OTHER): Payer: Self-pay

## 2020-12-26 ENCOUNTER — Other Ambulatory Visit: Payer: Self-pay

## 2020-12-26 VITALS — BP 128/74 | HR 82 | Wt 162.5 lb

## 2020-12-26 DIAGNOSIS — Z0130 Encounter for examination of blood pressure without abnormal findings: Secondary | ICD-10-CM

## 2020-12-26 NOTE — Progress Notes (Signed)
Pt here today for BP check s/p increase in dose of Nifedipine 30 mg to 60 mg bid.  With Spanish Interpreter pt reports that she is only taking 30 mg bid as when she started taking the 60 mg she would get dizzy.  Pt stated that the nurse had told her that if she has any sx's that she could stop taking the 60 mg dose.  Pt reports that she took the 60 mg dose for three days before returning to orginial dose.  Pt denies headache and visual disturbances.  BP LA 128/74.  Pt advised to continue to take Nifedipine 30 mg po bid as she is, continue to monitor for sx's of elevated blood pressure, and to call the office with any concerns.  If she does not then we will f/u with her at her PP visit scheduled on 01/18/21.  Pt verbalized understanding with further questions.   Mel Almond, RN  12/26/20

## 2020-12-26 NOTE — Progress Notes (Signed)
Agree with nurses's documentation of this patient's clinic encounter.  Malaiyah Achorn L, MD  

## 2020-12-30 ENCOUNTER — Other Ambulatory Visit: Payer: Self-pay

## 2021-01-02 ENCOUNTER — Other Ambulatory Visit: Payer: Self-pay

## 2021-01-02 ENCOUNTER — Encounter (INDEPENDENT_AMBULATORY_CARE_PROVIDER_SITE_OTHER): Payer: Self-pay | Admitting: Primary Care

## 2021-01-02 ENCOUNTER — Ambulatory Visit (INDEPENDENT_AMBULATORY_CARE_PROVIDER_SITE_OTHER): Payer: Self-pay | Admitting: Primary Care

## 2021-01-02 VITALS — BP 132/72 | HR 75 | Temp 97.5°F | Ht 64.0 in | Wt 166.2 lb

## 2021-01-02 DIAGNOSIS — E1142 Type 2 diabetes mellitus with diabetic polyneuropathy: Secondary | ICD-10-CM

## 2021-01-02 DIAGNOSIS — I10 Essential (primary) hypertension: Secondary | ICD-10-CM

## 2021-01-02 DIAGNOSIS — Z23 Encounter for immunization: Secondary | ICD-10-CM

## 2021-01-02 DIAGNOSIS — Z794 Long term (current) use of insulin: Secondary | ICD-10-CM

## 2021-01-02 DIAGNOSIS — O165 Unspecified maternal hypertension, complicating the puerperium: Secondary | ICD-10-CM

## 2021-01-02 DIAGNOSIS — Z760 Encounter for issue of repeat prescription: Secondary | ICD-10-CM

## 2021-01-02 LAB — POCT GLYCOSYLATED HEMOGLOBIN (HGB A1C): Hemoglobin A1C: 6.5 % — AB (ref 4.0–5.6)

## 2021-01-02 MED ORDER — INSULIN PEN NEEDLE 32G X 4 MM MISC
3 refills | Status: DC
  Filled 2021-01-02: qty 100, 25d supply, fill #0

## 2021-01-02 MED ORDER — INSULIN GLARGINE 100 UNIT/ML SOLOSTAR PEN
10.0000 [IU] | PEN_INJECTOR | Freq: Every day | SUBCUTANEOUS | 11 refills | Status: DC
  Filled 2021-01-02: qty 9, 90d supply, fill #0
  Filled 2021-01-10: qty 3, 28d supply, fill #0
  Filled 2021-03-07: qty 3, 28d supply, fill #1
  Filled 2021-03-08: qty 3, 28d supply, fill #0
  Filled 2021-06-23: qty 3, 28d supply, fill #1
  Filled 2021-07-20: qty 3, 28d supply, fill #2
  Filled 2021-08-20: qty 3, 28d supply, fill #3

## 2021-01-02 MED ORDER — TRUEPLUS LANCETS 28G MISC
3 refills | Status: DC
  Filled 2021-01-02: qty 100, 50d supply, fill #0

## 2021-01-02 MED ORDER — NIFEDIPINE ER OSMOTIC RELEASE 60 MG PO TB24
60.0000 mg | ORAL_TABLET | Freq: Two times a day (BID) | ORAL | 1 refills | Status: DC
  Filled 2021-01-02 – 2021-03-07 (×2): qty 180, 90d supply, fill #0
  Filled 2021-03-08: qty 60, 30d supply, fill #0

## 2021-01-02 MED ORDER — TRUE METRIX BLOOD GLUCOSE TEST VI STRP
ORAL_STRIP | 12 refills | Status: DC
  Filled 2021-01-02: qty 100, 50d supply, fill #0

## 2021-01-02 NOTE — Progress Notes (Signed)
[facility]   Subjective:   Audrey Mcmillan is a 40 y.o. female presents for hospital follow up and establish care. Admit date to the hospital was 12/05/20, patient was discharged from the hospital on 12/09/20, patient was admitted for:   Past Medical History:  Diagnosis Date   Diabetes mellitus without complication ([place])    type 2   High cholesterol    Hyperthyroidism    Thyroid cancer ([place])      No Known Allergies    Current Outpatient Medications on File Prior to Visit  Medication Sig Dispense Refill   Blood Glucose Monitoring Suppl (TRUE METRIX METER) w/Device KIT Use as instructed 1 kit 0   glucose blood (TRUE METRIX BLOOD GLUCOSE TEST) test strip Use as instructed. Check blood glucose levels twice per day. 100 each 12   insulin glargine (LANTUS) 100 UNIT/ML Solostar Pen Inject 15 Units into the skin at bedtime. 9 mL 11   Insulin Pen Needle 32G X 4 MM MISC Use to inject insulin up to 4 times daily as directed. 100 each 3   Insulin Syringe-Needle U-100 30G X 5/16" 1 ML MISC Inject 1 application into the skin 2 times daily at 12 noon and 4 pm. 100 each 5   levothyroxine (SYNTHROID) 175 MCG tablet Take 1 tablet (175 mcg total) by mouth daily. 30 tablet 5   NIFEdipine (PROCARDIA XL/NIFEDICAL XL) 60 MG 24 hr tablet Take 1 tablet (60 mg total) by mouth 2 (two) times daily. 60 tablet 1   prenatal vitamin w/FE, FA (PRENATAL 1 + 1) 27-1 MG TABS tablet Take 1 tablet by mouth daily at 12 noon. 30 tablet 11   TRUEplus Lancets 28G MISC Use as instructed. Check blood glucose levels twice per day. 100 each 3   No current facility-administered medications on file prior to visit.     Review of System: ROS  Objective:  BP 132/72 (BP Location: Left Arm, Patient Position: Sitting, Cuff Size: Normal)   Pulse 75   Temp (!) 97.5 F (36.4 C) (Temporal)   Ht 5' 4"  (1.626 m)   Wt 166 lb 3.2 oz (75.4 kg)   LMP 01/24/2020 (Approximate)   SpO2 95%   Breastfeeding Yes    BMI 28.53 kg/m   Filed Weights   01/02/21 1417  Weight: 166 lb 3.2 oz (75.4 kg)    Physical Exam: General Appearance: Well nourished, in no apparent distress. Eyes: PERRLA, EOMs, conjunctiva no swelling or erythema Sinuses: No Frontal/maxillary tenderness ENT/Mouth: Ext aud canals clear, TMs without erythema, bulging. No erythema, swelling, or exudate on post pharynx.  Tonsils not swollen or erythematous. Hearing normal.  Neck: Supple, thyroid normal.  Respiratory: Respiratory effort normal, BS equal bilaterally without rales, rhonchi, wheezing or stridor.  Cardio: RRR with no MRGs. Brisk peripheral pulses without edema.  Abdomen: Soft, + BS.  Non tender, no guarding, rebound, hernias, masses. Lymphatics: Non tender without lymphadenopathy.  Musculoskeletal: Full ROM, 5/5 strength, normal gait.  Skin: Warm, dry without rashes, lesions, ecchymosis.  Neuro: Cranial nerves intact. Normal muscle tone, no cerebellar symptoms. Sensation intact.  Psych: Awake and oriented X 3, normal affect, Insight and Judgment appropriate.    Assessment:   1. Type 2 diabetes mellitus with diabetic polyneuropathy, with long-term current use of insulin ([facility])   2. Need for immunization against influenza     No orders of the defined types were placed in this encounter.   This note has been created with Museum/gallery curator and  smart Company secretary. Any transcriptional errors are unintentional.   Kerin Perna, NP 01/02/2021, 2:38 PM

## 2021-01-02 NOTE — Progress Notes (Signed)
Subjective:  Patient ID: Audrey Mcmillan Mcmillan, female    DOB: [birth year]  Age: 40 y.o. MRN: [id number]  CC: Diabetes   HPI Audrey Mcmillan Mcmillan presents for follow-up of diabetes. Patient does check blood sugar at home  Compliant with meds - Yes Checking CBGs? Yes  Fasting avg - 100-140  Postprandial average -  Exercising regularly? - Yes Watching carbohydrate intake? - Yes Neuropathy ? - Yes Hypoglycemic events - No  - Recovers with :   Pertinent ROS:  Polyuria - No Polydipsia - No Vision problems - No Also, management of Hypertension she is still breastfeeding .  Denies shortness of breath, headaches, chest pain or lower extremity edema. No change in medication Procardia XL 75m bid Medications as noted below. Taking them regularly without complication/adverse reaction being reported today.   History [name] a past medical history of Diabetes mellitus without complication ([place], High cholesterol, Hyperthyroidism, and [place].   She has a past surgical history that includes No past surgeries.   Her Family history is unknown by patient.She reports that she has never smoked. She has never used smokeless tobacco. She reports that she does not drink alcohol and does not use drugs.  Current Outpatient Medications on File Prior to Visit  Medication Sig Dispense Refill   Blood Glucose Monitoring Suppl (TRUE METRIX METER) w/Device KIT Use as instructed 1 kit 0   glucose blood (TRUE METRIX BLOOD GLUCOSE TEST) test strip Use as instructed. Check blood glucose levels twice per day. 100 each 12   Insulin Pen Needle 32G X 4 MM MISC Use to inject insulin up to 4 times daily as directed. 100 each 3   Insulin Syringe-Needle U-100 30G X 5/16" 1 ML MISC Inject 1 application into the skin 2 times daily at 12 noon and 4 pm. 100 each 5   levothyroxine (SYNTHROID) 175 MCG tablet Take 1 tablet (175 mcg total) by mouth daily. 30 tablet 5   NIFEdipine (PROCARDIA XL/NIFEDICAL XL)  60 MG 24 hr tablet Take 1 tablet (60 mg total) by mouth 2 (two) times daily. 60 tablet 1   prenatal vitamin w/FE, FA (PRENATAL 1 + 1) 27-1 MG TABS tablet Take 1 tablet by mouth daily at 12 noon. 30 tablet 11   TRUEplus Lancets 28G MISC Use as instructed. Check blood glucose levels twice per day. 100 each 3   No current facility-administered medications on file prior to visit.    ROS Comprehensive ROS pertinent positive and negative noted in HPI   Objective:  BP 132/72 (BP Location: Left Arm, Patient Position: Sitting, Cuff Size: Normal)   Pulse 75   Temp (!) 97.5 F (36.4 C) (Temporal)   Ht 5' 4"  (1.626 Audrey Mcmillan)   Wt 166 lb 3.2 oz (75.4 kg)   LMP 01/24/2020 (Approximate)   SpO2 95%   Breastfeeding Yes   BMI 28.53 kg/Audrey Mcmillan   BP Readings from Last 3 Encounters:  01/02/21 132/72  12/26/20 128/74  12/14/20 139/82    Wt Readings from Last 3 Encounters:  01/02/21 166 lb 3.2 oz (75.4 kg)  12/26/20 162 lb 8 oz (73.7 kg)  12/14/20 163 lb (73.9 kg)    Physical Exam Physical exam: General: Vital signs reviewed.  Patient is well-developed and well-nourished, over weight female in no acute distress and cooperative with exam. Head: Normocephalic and atraumatic. Eyes: EOMI, conjunctivae normal, no scleral icterus. Neck: Supple, trachea midline, normal ROM, no JVD, masses, thyromegaly, or carotid bruit present. Cardiovascular: RRR, S1 normal, S2  normal, no murmurs, gallops, or rubs. Pulmonary/Chest: Clear to auscultation bilaterally, no wheezes, rales, or rhonchi. Abdominal: Soft, non-tender, non-distended, BS +, no masses, organomegaly, or guarding present. Musculoskeletal: No joint deformities, erythema, or stiffness, ROM full and nontender. Extremities: No lower extremity edema bilaterally,  pulses symmetric and intact bilaterally. No cyanosis or clubbing. Neurological: A&O x3, Strength is normal Skin: Warm, dry and intact. No rashes or erythema. Psychiatric: Normal mood and affect. speech  and behavior is normal. Cognition and memory are normal.    Lab Results  Component Value Date   HGBA1C 6.5 (A) 01/02/2021   HGBA1C 10.4 (A) 06/03/2020   HGBA1C 10.2 (H) 02/05/2020    Lab Results  Component Value Date   WBC 19.0 (H) 12/07/2020   HGB 11.2 (L) 12/07/2020   HCT 33.4 (L) 12/07/2020   PLT 205 12/07/2020   GLUCOSE 89 12/08/2020   CHOL 168 06/03/2020   TRIG 240 (H) 06/03/2020   HDL 46 06/03/2020   LDLCALC 82 06/03/2020   ALT 20 12/07/2020   AST 22 12/07/2020   NA 133 (L) 12/08/2020   K 4.7 12/08/2020   CL 107 12/08/2020   CREATININE 1.35 (H) 12/08/2020   BUN 23 (H) 12/08/2020   CO2 19 (L) 12/08/2020   TSH 1.050 07/05/2020   HGBA1C 6.5 (A) 01/02/2021     Assessment & Plan:   Audrey Mcmillan Mcmillan was seen today for diabetes.  Diagnoses and all orders for this visit:  Type 2 diabetes mellitus with diabetic polyneuropathy, with long-term current use of insulin (HCC) -     HgB A1c 6.5 - Decreased Lantus 10 units in AM after bkf. ADA recommends the following therapeutic goals for glycemic control related to A1c measurements: Goal of therapy: is met at  6.5 hemoglobin A1c.  Monitor foods that are high in carbohydrates are the following rice, potatoes, breads, sugars,tortillas  and pastas.  Reduction in the intake (eating) will assist in lowering your blood sugars.  Patient prefers to do diabetic foot exam on f/u visit  -     Microalbumin/Creatinine Ratio, Urine  Need for immunization against influenza -     Flu Vaccine QUAD 23moIM (Fluarix, Fluzone & Alfiuria Quad PF)  I have discontinued MJoan Mcmillan's ibuprofen. I have also changed her insulin glargine. Additionally, I am having her maintain her True Metrix Meter, glucose blood, TRUEplus Lancets 28G, (prenatal vitamin w/FE, FA), Insulin Syringe-Needle U-100, levothyroxine, Insulin Pen Needle, and NIFEdipine.  Meds ordered this encounter  Medications   insulin glargine (LANTUS) 100 UNIT/ML Solostar Pen    Sig: Inject  10 Units into the skin at bedtime.    Dispense:  9 mL    Refill:  11     Follow-up:   Return in about 3 months (around 04/02/2021) for DM/HTN .  The above assessment and management plan was discussed with the patient. The patient verbalized understanding of and has agreed to the management plan. Patient is aware to call the clinic if symptoms fail to improve or worsen. Patient is aware when to return to the clinic for a follow-up visit. Patient educated on when it is appropriate to go to the emergency department.   MJuluis Mire NP-C

## 2021-01-02 NOTE — Patient Instructions (Signed)
Gripe en los adultos Influenza, Adult A la gripe tambin se la conoce como "influenza". Es una [employer], la nariz y la garganta (vas respiratorias). Se transmite fcilmente de persona a persona (es contagiosa). La gripe causa sntomas que son [employer] de un resfro, junto con fiebre alta y dolores corporales. Cules son las causas? La causa de esta afeccin es el virus de la influenza. Puede contraer el virus de las siguientes maneras: Al inhalar gotitas que quedan en el aire despus de que una persona infectada con gripe tosi o estornud. Al tocar algo que est contaminado con el virus y [employer] mano a la boca, la nariz o los ojos. Qu incrementa el riesgo? Hay ciertas cosas que lo pueden hacer ms propenso a Nurse, adult. Estas incluyen lo siguiente: No lavarse las manos con frecuencia. Tener contacto cercano con [employer] durante la temporada de resfro y gripe. Tocarse la boca, los ojos o la nariz sin antes lavarse las manos. No recibir la [employer]. Puede correr un mayor riesgo de [place] gripe, y [place] graves, como una infeccin pulmonar (neumona), si usted: Es mayor de 52 aos de edad. Est embarazada. Tiene debilitado el sistema que combate las defensas (sistema inmunitario) debido a una enfermedad o a que toma determinados medicamentos. Tiene una afeccin a largo plazo (crnica), como las siguientes: Enfermedad cardaca, renal o pulmonar. Diabetes. Asma. Tiene un trastorno heptico. Tiene mucho sobrepeso (obesidad [place]). Tiene anemia. Cules son los signos o sntomas? Los sntomas normalmente comienzan de repente y [place] [address] y [address]. Pueden incluir los siguientes: Cristy Hilts y [place]. Dolores de [place], dolores en el cuerpo o dolores musculares. Dolor de Investment banker, operational. Tos. Secrecin o congestin nasal. Molestias en el pecho. No querer comer tanto como lo hace normalmente. Sensacin de debilidad o  cansancio. Mareos. Malestar estomacal o vmitos. Cmo se trata? Si la gripe se detecta de forma temprana, puede recibir tratamiento con medicamentos antivirales. Esto puede ayudar a reducir la gravedad y la duracin de la enfermedad. Se los administrarn por boca o a travs de un tubo (catter) intravenoso. Cuidarse en su hogar puede ayudar a que mejoren los sntomas. El mdico puede recomendarle lo siguiente: Tomar medicamentos de Radio broadcast assistant. Beber abundante lquido. La gripe suele desaparecer sola. Si tiene sntomas muy graves u otros problemas, puede recibir tratamiento en un hospital. Siga estas instrucciones en su casa:   Actividad Descanse todo lo que sea necesario. Duerma lo suficiente. Foy [place] en su casa y no concurra al [place] o a la escuela, como se lo haya indicado el mdico. No salga de su casa hasta que no haya tenido fiebre por 24 horas sin tomar medicamentos. Salga de su casa solamente para ir al [employer]. Comida y bebida Wyatt Haste SRO (solucin de rehidratacin oral). Es Ardelia Mems bebida que se vende en farmacias y tiendas. Beba suficiente lquido como para Theatre manager la orina de color amarillo plido. En la medida en que pueda, beba lquidos transparentes en pequeas cantidades. Los lquidos transparentes son, por ejemplo: Grayce Sessions. Trocitos de hielo. Jugo de frutas mezclado con agua. Bebidas deportivas de bajas caloras. Coma alimentos suaves que sean fciles de digerir. En la medida que pueda, consuma pequeas cantidades. Estos alimentos incluyen: Bananas. Pur de [employer]. Arroz. Carnes magras. Tostadas. Galletas. No coma ni beba lo siguiente: Lquidos con alto contenido de azcar o cafena. Alcohol. Alimentos condimentados o con alto contenido de [place]. Indicaciones generales Use los medicamentos de venta libre y los recetados solamente  como se lo haya indicado el mdico. Use un humidificador de aire fro para que el aire de su casa est ms hmedo. Esto puede facilitar  la respiracin. Cuando utilice un humidificador de vapor fro, lmpielo a diario. Vace el agua y [place] por agua limpia. Al toser o estornudar, cbrase la boca y la [place]. Lvese las manos frecuentemente con agua y [place] y durante al menos 20 segundos. Esto tambin es importante despus de toser o Brewing technologist. Si no dispone de [place] y [place], use desinfectante para manos con alcohol. Cumpla con todas las visitas de seguimiento. Cmo se previene?  Colquese la vacuna antigripal todos los [place]. Puede colocarse la vacuna contra la gripe a fines de verano, en otoo o en invierno. Pregntele al mdico cundo debe aplicarse la vacuna contra la gripe. Evite el contacto con personas que estn enfermas durante el otoo y el invierno. Es la temporada del resfro y Counsellor. Comunquese con un mdico si: Tiene sntomas nuevos. Tiene los siguientes sntomas: Dolor de [place]. Materia fecal lquida (diarrea). Cristy Hilts. La tos empeora. Empieza a tener ms mucosidad. Tiene Higher education careers adviser. Vomita. Solicite ayuda de inmediato si: Le falta el aire. Tiene dificultad para respirar. La piel o las uas se ponen de un color azulado. Presenta dolor muy intenso o rigidez en el cuello. Tiene dolor de cabeza repentino. Le duele la cara o el odo de forma repentina. No puede comer ni beber sin vomitar. Estos sntomas pueden representar un problema grave que constituye Engineer, maintenance (IT). Solicite atencin mdica de inmediato. Comunquese con el servicio de emergencias de su localidad (911 en los [place]). No espere a ver si los sntomas desaparecen. No conduzca por sus propios medios [employer] Financial. Resumen A la gripe tambin se la conoce como "influenza". Es una [employer], la nariz y Patent examiner. Se transmite fcilmente de [place] persona a otra. Use los medicamentos de venta libre y los recetados solamente como se lo haya indicado el mdico. Aplicarse la vacuna contra la gripe todos los  aos es la mejor manera de no contagiarse la gripe. Esta informacin no tiene Marine scientist el consejo del mdico. Asegrese de hacerle al mdico cualquier pregunta que tenga. Document Revised: 11/05/2019 Document Reviewed: 11/05/2019 Elsevier Patient Education  2022 [employer].

## 2021-01-03 LAB — MICROALBUMIN / CREATININE URINE RATIO
Creatinine, Urine: 80.8 mg/dL
Microalb/Creat Ratio: 1015 mg/g creat — ABNORMAL HIGH (ref 0–29)
Microalbumin, Urine: 820.5 ug/mL

## 2021-01-11 ENCOUNTER — Other Ambulatory Visit: Payer: Self-pay

## 2021-01-12 ENCOUNTER — Other Ambulatory Visit: Payer: Self-pay

## 2021-01-17 NOTE — Progress Notes (Signed)
[place] Partum Visit Note  Audrey Mcmillan is a 40 y.o. [id number] female who presents for a postpartum visit. She is 6 weeks postpartum following a normal spontaneous vaginal delivery.  I have fully reviewed the prenatal and intrapartum course. The delivery was at 35.6 gestational weeks.  Anesthesia: none. Postpartum course has been uneventful. Baby is doing well. Baby is feeding by breast. Bleeding no bleeding. Bowel function is normal. Bladder function is normal. Patient is sexually active. Contraception method is IUD. Postpartum depression screening: negative.   The pregnancy intention screening data noted above was reviewed. Potential methods of contraception were discussed. The patient elected to proceed with No data recorded.   Edinburgh Postnatal Depression Scale - 01/18/21 0824       Edinburgh Postnatal Depression Scale:  In the Past 7 Days   I have been able to laugh and see the funny side of things. 0    I have looked forward with enjoyment to things. 0    I have blamed myself unnecessarily when things went wrong. 0    I have been anxious or worried for no good reason. 0    I have felt scared or panicky for no good reason. 0    Things have been getting on top of me. 0    I have been so unhappy that I have had difficulty sleeping. 0    I have felt sad or miserable. 0    I have been so unhappy that I have been crying. 0    The thought of harming myself has occurred to me. 0    Edinburgh Postnatal Depression Scale Total 0             Health Maintenance Due  Topic Date Due   OPHTHALMOLOGY EXAM  Never done   Pneumococcal Vaccine 74-57 Years old (2 - PCV) 06/28/2018   COVID-19 Vaccine (2 - Janssen risk series) 08/06/2019   PAP SMEAR-Modifier  03/27/2020   FOOT EXAM  12/14/2020    The following portions of the patient's history were reviewed and updated as appropriate: allergies, current medications, past family history, past medical history, past social history, past  surgical history, and problem list.  Review of Systems Pertinent items noted in HPI and remainder of comprehensive ROS otherwise negative.  Objective:  BP 117/76    Pulse 80    Wt 161 lb 6.4 oz (73.2 kg)    LMP 01/18/2021 (Approximate)    Breastfeeding Yes    BMI 27.70 kg/m    General:  alert, cooperative, and appears stated age   Breasts:  not indicated  Lungs: Comfortable on room air  GU exam:  not indicated       Assessment:    There are no diagnoses linked to this encounter.  Normal postpartum exam.   Plan:   Essential components of care per ACOG recommendations:  1.  Mood and well being: Patient with negative depression screening today. Reviewed local resources for support.  - Patient tobacco use? No.   - hx of drug use? No.    2. Infant care and feeding:  -Patient currently breastmilk feeding? Yes. Reviewed importance of draining breast regularly to support lactation.  -Social determinants of health (SDOH) reviewed in EPIC. The following needs were identified: food insecurity, offered food market  3. Sexuality, contraception and birth spacing - Patient does not want a pregnancy in the next year.  Desired family size is 2 children.  - Reviewed forms of contraception in tiered  fashion. Patient desired IUD today, referred to health department for insurance reasons - Discussed birth spacing of 18 months  4. Sleep and fatigue -Encouraged family/partner/community support of 4 hrs of uninterrupted sleep to help with mood and fatigue  5. Physical Recovery  - Discussed patients delivery and complications. She describes her labor as good. - Patient had a Vaginal, no problems at delivery. Patient had a 1st degree laceration. Perineal healing reviewed. Patient expressed understanding - Patient has urinary incontinence? No. - Patient is safe to resume physical and sexual activity  6.  Health Maintenance - HM due items addressed Yes - Last pap smear  Diagnosis  Date Value  Ref Range Status  03/27/2017   Final   NEGATIVE FOR INTRAEPITHELIAL LESIONS OR MALIGNANCY.   Pap smear not done at today's visit- referred to [facility] for insurance reasons -Breast Cancer screening indicated? No.   7. Chronic Disease/Pregnancy Condition follow up: Hypertension and DM2 - referred to PCP for follow up, already well established with a PCP who is managing both her DM and HTN - severe PreE during delivery, discharged on Nifedipine 60 BID, reports she is still taking it and is normotensive today. Discussed she should continue until 1 week prior to next PCP visit and then stop, if normotensive at that visit she can discontinue anti-HTN meds but otherwise she should resume - DM2 prior to pregnancy, discharged on glargine 15u QHS, being actively managed by PCP, last seen 01/02/2021   Clarnce Flock, MD [facility] [place] [place]

## 2021-01-18 ENCOUNTER — Ambulatory Visit (INDEPENDENT_AMBULATORY_CARE_PROVIDER_SITE_OTHER): Payer: Self-pay | Admitting: Family Medicine

## 2021-01-18 ENCOUNTER — Encounter: Payer: Self-pay | Admitting: Family Medicine

## 2021-01-18 ENCOUNTER — Other Ambulatory Visit: Payer: Self-pay

## 2021-01-18 DIAGNOSIS — E1142 Type 2 diabetes mellitus with diabetic polyneuropathy: Secondary | ICD-10-CM

## 2021-01-18 DIAGNOSIS — Z789 Other specified health status: Secondary | ICD-10-CM

## 2021-01-18 DIAGNOSIS — O1410 Severe pre-eclampsia, unspecified trimester: Secondary | ICD-10-CM

## 2021-01-18 DIAGNOSIS — Z794 Long term (current) use of insulin: Secondary | ICD-10-CM

## 2021-01-18 NOTE — Patient Instructions (Addendum)
Para conseguir un DIU llama el departamento de salud:  Please call [phone] to schedule an appointment in either our [facility] or [facility].     BCCCP (Breast and Cervical Cancer Control Program)  Para el papanicolau:  [phone]

## 2021-02-07 ENCOUNTER — Other Ambulatory Visit: Payer: Self-pay

## 2021-03-08 ENCOUNTER — Other Ambulatory Visit: Payer: Self-pay

## 2021-03-08 ENCOUNTER — Other Ambulatory Visit: Payer: Self-pay | Admitting: *Deleted

## 2021-03-08 DIAGNOSIS — Z124 Encounter for screening for malignant neoplasm of cervix: Secondary | ICD-10-CM

## 2021-03-08 NOTE — Addendum Note (Signed)
Addended by: Jonna Clark E on: 03/08/2021 02:03 PM   Modules accepted: Orders

## 2021-03-08 NOTE — Progress Notes (Signed)
Patient: Audrey Mcmillan           Date of Birth: Aug 07, 1980           MRN: [id number] Visit Date: 03/08/2021 PCP: Kerin Perna, NP  Cervical Cancer Screening Do you smoke?: No Have you ever had or been told you have an allergy to latex products?: No Marital status: Single Date of last pap smear: 2-5 yrs ago (03/27/2017-Negative ([facility])) Date of last menstrual period: 02/26/21 Number of pregnancies: 2 Number of births: 1 Have you ever had any of the following? Hysterectomy: No Tubal ligation (tubes tied): No Abnormal bleeding: No Abnormal pap smear: No Venereal warts: No A sex partner with venereal warts: No A high risk* sex partner: No  Cervical Exam  Abnormal Observations: Normal Exam Recommendations: Last Pap smear 03/27/2017 at [facility] and normal. Per patient has no history of an abnormal Pap smear. Last Pap smear result is available in Epic. Let patient know that if today's Pap smear is normal and HPV negative that her next Pap smear will be due in 5 years. Informed patient that will follow up with her within the next couple of weeks with results of her Pap smear by phone. Patient verbalized understanding.    Spanish interpreter Rudene Anda from [facility] provided.  Patient's History Patient Active Problem List   Diagnosis Date Noted   Severe preeclampsia 12/05/2020   Preeclampsia 12/05/2020   Gestational hypertension, third trimester 11/14/2020   Marginal insertion of umbilical cord affecting management of mother 08/14/2020   Proteinuria affecting pregnancy in first trimester 06/13/2020   Kidney stone complicating pregnancy, first trimester 06/06/2020   Supervision of high risk pregnancy, antepartum 05/31/2020   AMA (advanced maternal age) multigravida 35+ 05/31/2020   Preexisting diabetes complicating pregnancy, antepartum 05/31/2020   Language barrier 05/31/2020   Type 2 diabetes mellitus with diabetic polyneuropathy,  with long-term current use of insulin ([place]) 06/27/2017   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

## 2021-03-09 LAB — CYTOLOGY - PAP
Comment: NEGATIVE
Diagnosis: NEGATIVE
High risk HPV: NEGATIVE

## 2021-03-10 ENCOUNTER — Telehealth: Payer: Self-pay

## 2021-03-10 NOTE — Telephone Encounter (Signed)
Called patient via Audrey Mcmillan, [employer] to give pap smear results. Informed patient that pap smear was normal and HPV was negative. Based on this result her next pap smear will be due in 5 years. Patient voiced understanding.  

## 2021-04-03 ENCOUNTER — Ambulatory Visit (INDEPENDENT_AMBULATORY_CARE_PROVIDER_SITE_OTHER): Payer: Self-pay | Admitting: Primary Care

## 2021-05-01 ENCOUNTER — Other Ambulatory Visit: Payer: Self-pay

## 2021-05-08 ENCOUNTER — Other Ambulatory Visit: Payer: Self-pay

## 2021-05-29 ENCOUNTER — Ambulatory Visit
Admission: RE | Admit: 2021-05-29 | Discharge: 2021-05-29 | Disposition: A | Discharge status: Home / Self Care | Payer: Self-pay | Source: Ambulatory Visit | Attending: Obstetrics and Gynecology | Admitting: Obstetrics and Gynecology

## 2021-05-29 ENCOUNTER — Other Ambulatory Visit: Payer: Self-pay | Admitting: Obstetrics and Gynecology

## 2021-05-29 DIAGNOSIS — R7611 Nonspecific reaction to tuberculin skin test without active tuberculosis: Secondary | ICD-10-CM

## 2021-06-13 ENCOUNTER — Other Ambulatory Visit: Payer: Self-pay

## 2021-06-13 ENCOUNTER — Ambulatory Visit (INDEPENDENT_AMBULATORY_CARE_PROVIDER_SITE_OTHER): Payer: Self-pay | Admitting: Primary Care

## 2021-06-13 MED ORDER — NIFEDIPINE ER OSMOTIC RELEASE 30 MG PO TB24
30.0000 mg | ORAL_TABLET | Freq: Every day | ORAL | 11 refills | Status: DC
  Filled 2021-06-13 – 2021-06-23 (×2): qty 30, 30d supply, fill #0
  Filled 2021-07-20: qty 30, 30d supply, fill #1
  Filled 2021-08-20: qty 30, 30d supply, fill #2
  Filled 2022-01-03: qty 30, 30d supply, fill #3

## 2021-06-14 ENCOUNTER — Other Ambulatory Visit: Payer: Self-pay

## 2021-06-21 ENCOUNTER — Other Ambulatory Visit: Payer: Self-pay

## 2021-06-23 ENCOUNTER — Other Ambulatory Visit (INDEPENDENT_AMBULATORY_CARE_PROVIDER_SITE_OTHER): Payer: Self-pay | Admitting: Primary Care

## 2021-06-23 ENCOUNTER — Other Ambulatory Visit: Payer: Self-pay

## 2021-06-23 ENCOUNTER — Encounter (INDEPENDENT_AMBULATORY_CARE_PROVIDER_SITE_OTHER): Payer: Self-pay | Admitting: Primary Care

## 2021-06-23 ENCOUNTER — Ambulatory Visit (INDEPENDENT_AMBULATORY_CARE_PROVIDER_SITE_OTHER): Payer: Self-pay | Admitting: Primary Care

## 2021-06-23 VITALS — BP 131/80 | HR 66 | Temp 98.0°F | Ht 64.0 in | Wt 169.4 lb

## 2021-06-23 DIAGNOSIS — E1142 Type 2 diabetes mellitus with diabetic polyneuropathy: Secondary | ICD-10-CM

## 2021-06-23 DIAGNOSIS — Z760 Encounter for issue of repeat prescription: Secondary | ICD-10-CM

## 2021-06-23 DIAGNOSIS — E039 Hypothyroidism, unspecified: Secondary | ICD-10-CM

## 2021-06-23 DIAGNOSIS — Z794 Long term (current) use of insulin: Secondary | ICD-10-CM

## 2021-06-23 LAB — POCT GLYCOSYLATED HEMOGLOBIN (HGB A1C): Hemoglobin A1C: 12.6 % — AB (ref 4.0–5.6)

## 2021-06-23 MED ORDER — TECHLITE PEN NEEDLES 32G X 4 MM MISC
3 refills | Status: AC
  Filled 2021-06-23: qty 100, 25d supply, fill #0

## 2021-06-23 MED ORDER — TRUE METRIX BLOOD GLUCOSE TEST VI STRP
ORAL_STRIP | 12 refills | Status: AC
  Filled 2021-06-23: qty 100, 50d supply, fill #0

## 2021-06-23 MED ORDER — TRUEPLUS LANCETS 28G MISC
3 refills | Status: AC
  Filled 2021-06-23: qty 100, 50d supply, fill #0

## 2021-06-23 NOTE — Progress Notes (Signed)
Audrey Mcmillan, is a 41 y.o. female  MLJ:[id number]  HQR:[id number]  DOB - [birth year]  Chief Complaint  Patient presents with   Follow-up    DM/HTN       Subjective:   Audrey Mcmillan is a 41 y.o. female here today for a follow up visit. Patient has No headache, No chest pain, No abdominal pain - No Nausea, No new weakness tingling or numbness, No Cough - shortness of breath  No problems updated.  No Known Allergies  Past Medical History:  Diagnosis Date   Diabetes mellitus without complication (HCC)    type 2   High cholesterol    Hyperthyroidism    Thyroid cancer ([place])     Current Outpatient Medications on File Prior to Visit  Medication Sig Dispense Refill   Blood Glucose Monitoring Suppl (TRUE METRIX METER) w/Device KIT Use as instructed 1 kit 0   glucose blood (TRUE METRIX BLOOD GLUCOSE TEST) test strip Use as instructed. Check blood glucose levels twice per day. 100 each 12   insulin glargine (LANTUS) 100 UNIT/ML Solostar Pen Inject 10 Units into the skin at bedtime. 9 mL 11   Insulin Pen Needle 32G X 4 MM MISC Use to inject insulin up to 4 times daily as directed. 100 each 3   Insulin Syringe-Needle U-100 30G X 5/16" 1 ML MISC Inject 1 application into the skin 2 times daily at 12 noon and 4 pm. 100 each 5   TRUEplus Lancets 28G MISC Use as instructed. Check blood glucose levels twice per day. 100 each 3   levothyroxine (SYNTHROID) 175 MCG tablet Take 1 tablet (175 mcg total) by mouth daily. 30 tablet 5   NIFEdipine (PROCARDIA-XL/NIFEDICAL-XL) 30 MG 24 hr tablet Take 1 tablet (30 mg total) by mouth daily. (Patient not taking: Reported on 06/23/2021) 30 tablet 11   No current facility-administered medications on file prior to visit.    Objective:   Vitals:   06/23/21 1012  BP: 131/80  Pulse: 66  Temp: 98 F (36.7 C)  TempSrc: Oral  SpO2: 97%  Weight: 169 lb 6.4 oz (76.8 kg)  Height: _0  (1.626 m)     Exam General appearance : Awake, alert, not in any distress. Speech Clear. Not toxic looking HEENT: Atraumatic and Normocephalic, pupils equally reactive to light and accomodation Neck: Supple, no JVD. No cervical lymphadenopathy.  Chest: Good air entry bilaterally, no added sounds  CVS: S1 S2 regular, no murmurs.  Abdomen: Bowel sounds present, Non tender and not distended with no gaurding, rigidity or rebound. Extremities: B/L Lower Ext shows no edema, both legs are warm to touch Neurology: Awake alert, and oriented X 3, CN II-XII intact, Non focal Skin: No Rash  Data Review Lab Results  Component Value Date   HGBA1C 12.6 (A) 06/23/2021   HGBA1C 6.5 (A) 01/02/2021   HGBA1C 10.4 (A) 06/03/2020    Assessment & Plan   1. Type 2 diabetes mellitus with diabetic polyneuropathy, with long-term current use of insulin (HCC) Discussed  co- morbidities with uncontrol diabetes  Complications -diabetic retinopathy, (close your eyes ? What do you see nothing) nephropathy decrease in kidney function- can lead to dialysis-on a machine 3 days a week to filter your kidney, neuropathy- numbness and tinging in your hands and feet,  increase risk of heart attack and stroke, and amputation due to decrease wound healing and circulation. Decrease your risk by taking medication daily as prescribed, monitor carbohydrates- foods that are  high in carbohydrates are the following rice, potatoes, breads, sugars, and pastas.  Reduction in the intake (eating) will assist in lowering your blood sugars. Exercise daily at least 30 minutes daily.  - HgB A1c 12.6 - CMP14+EGFR - CBC with Differential - Lipid Panel  2. Acquired hypothyroidism - TSH + free T4    Patient have been counseled extensively about nutrition and exercise. Other issues discussed during this visit include: low cholesterol diet, weight control and daily exercise, foot care, annual eye examinations at Ophthalmology, importance of adherence with  medications and regular follow-up. We also discussed long term complications of uncontrolled diabetes and hypertension.   Return in about 3 months (around 09/23/2021) for dm.  The patient was given clear instructions to go to ER or return to medical center if symptoms don't improve, worsen or new problems develop. The patient verbalized understanding. The patient was told to call to get lab results if they haven't heard anything in the next week.   This note has been created with Surveyor, quantity. Any transcriptional errors are unintentional.   Kerin Perna, NP 06/23/2021, 11:57 AM

## 2021-06-24 LAB — LIPID PANEL
Chol/HDL Ratio: 9.6 ratio — ABNORMAL HIGH (ref 0.0–4.4)
Cholesterol, Total: 462 mg/dL — ABNORMAL HIGH (ref 100–199)
HDL: 48 mg/dL (ref >39)
LDL Chol Calc (NIH): 326 mg/dL — ABNORMAL HIGH (ref 0–99)
Triglycerides: 355 mg/dL — ABNORMAL HIGH (ref 0–149)
VLDL Cholesterol Cal: 88 mg/dL — ABNORMAL HIGH (ref 5–40)

## 2021-06-24 LAB — CMP14+EGFR
ALT: 24 IU/L (ref 0–32)
AST: 30 IU/L (ref 0–40)
Albumin/Globulin Ratio: 1.4 (ref 1.2–2.2)
Albumin: 4.2 g/dL (ref 3.8–4.8)
Alkaline Phosphatase: 89 IU/L (ref 44–121)
BUN/Creatinine Ratio: 12 (ref 9–23)
BUN: 13 mg/dL (ref 6–20)
Bilirubin Total: <0.2 mg/dL (ref 0.0–1.2)
CO2: 21 mmol/L (ref 20–29)
Calcium: 9.8 mg/dL (ref 8.7–10.2)
Chloride: 99 mmol/L (ref 96–106)
Creatinine, Ser: 1.11 mg/dL — ABNORMAL HIGH (ref 0.57–1.00)
Globulin, Total: 2.9 g/dL (ref 1.5–4.5)
Glucose: 233 mg/dL — ABNORMAL HIGH (ref 70–99)
Potassium: 4.6 mmol/L (ref 3.5–5.2)
Sodium: 134 mmol/L (ref 134–144)
Total Protein: 7.1 g/dL (ref 6.0–8.5)
eGFR: 65 mL/min/{1.73_m2} (ref >59)

## 2021-06-24 LAB — TSH+FREE T4
Free T4: 0.13 ng/dL — ABNORMAL LOW (ref 0.82–1.77)
TSH: 73 u[IU]/mL — ABNORMAL HIGH (ref 0.450–4.500)

## 2021-06-24 LAB — CBC WITH DIFFERENTIAL/PLATELET
Basophils Absolute: 0.1 10*3/uL (ref 0.0–0.2)
Basos: 1 %
EOS (ABSOLUTE): 0.1 10*3/uL (ref 0.0–0.4)
Eos: 2 %
Hematocrit: 44 % (ref 34.0–46.6)
Hemoglobin: 14.8 g/dL (ref 11.1–15.9)
Immature Grans (Abs): 0 10*3/uL (ref 0.0–0.1)
Immature Granulocytes: 0 %
Lymphocytes Absolute: 3.6 10*3/uL — ABNORMAL HIGH (ref 0.7–3.1)
Lymphs: 46 %
MCH: 30 pg (ref 26.6–33.0)
MCHC: 33.6 g/dL (ref 31.5–35.7)
MCV: 89 fL (ref 79–97)
Monocytes Absolute: 0.3 10*3/uL (ref 0.1–0.9)
Monocytes: 3 %
Neutrophils Absolute: 3.8 10*3/uL (ref 1.4–7.0)
Neutrophils: 48 %
Platelets: 278 10*3/uL (ref 150–450)
RBC: 4.94 x10E6/uL (ref 3.77–5.28)
RDW: 12.5 % (ref 11.7–15.4)
WBC: 7.9 10*3/uL (ref 3.4–10.8)

## 2021-06-30 ENCOUNTER — Other Ambulatory Visit: Payer: Self-pay

## 2021-07-04 ENCOUNTER — Encounter (INDEPENDENT_AMBULATORY_CARE_PROVIDER_SITE_OTHER): Payer: Self-pay | Admitting: Primary Care

## 2021-07-21 ENCOUNTER — Other Ambulatory Visit: Payer: Self-pay

## 2021-07-28 ENCOUNTER — Ambulatory Visit: Payer: No Typology Code available for payment source | Admitting: Pharmacist

## 2021-07-31 ENCOUNTER — Telehealth (INDEPENDENT_AMBULATORY_CARE_PROVIDER_SITE_OTHER): Payer: Self-pay | Admitting: Primary Care

## 2021-07-31 NOTE — Telephone Encounter (Signed)
Copied from [place] [id number]. Topic: Appointment Scheduling - Scheduling Inquiry for Clinic >> Jul 31, 2021 11:52 AM Leitha Schuller wrote: Reason for CRM: patient requesting to r/s 7-7 appt w/ Lurena Joiner  Please assist patient further, patient needs spanish interpreter

## 2021-08-20 ENCOUNTER — Other Ambulatory Visit: Payer: Self-pay | Admitting: Obstetrics and Gynecology

## 2021-08-20 DIAGNOSIS — E039 Hypothyroidism, unspecified: Secondary | ICD-10-CM

## 2021-08-21 ENCOUNTER — Other Ambulatory Visit: Payer: Self-pay

## 2021-08-23 ENCOUNTER — Other Ambulatory Visit: Payer: Self-pay

## 2021-08-24 ENCOUNTER — Other Ambulatory Visit (INDEPENDENT_AMBULATORY_CARE_PROVIDER_SITE_OTHER): Payer: Self-pay | Admitting: Primary Care

## 2021-08-24 DIAGNOSIS — E039 Hypothyroidism, unspecified: Secondary | ICD-10-CM

## 2021-08-24 NOTE — Telephone Encounter (Signed)
Requested medication (s) are due for refill today: yes  Requested medication (s) are on the active medication list: yes  Last refill:  11/21/20 #30/5  Future visit scheduled: yes  Notes to clinic:  Unable to refill per protocol, last refill by another provider.     Requested Prescriptions  Pending Prescriptions Disp Refills   levothyroxine (SYNTHROID) 175 MCG tablet 30 tablet 5    Sig: Take 1 tablet (175 mcg total) by mouth daily.     Endocrinology:  Hypothyroid Agents Failed - 08/24/2021 11:51 AM      Failed - TSH in normal range and within 360 days    TSH  Date Value Ref Range Status  06/23/2021 73.000 (H) 0.450 - 4.500 uIU/mL Final         Passed - Valid encounter within last 12 months    Recent Outpatient Visits           2 months ago Type 2 diabetes mellitus with diabetic polyneuropathy, with long-term current use of insulin ([place])   [place] [facility] Juluis Mire P, NP   7 months ago Type 2 diabetes mellitus with diabetic polyneuropathy, with long-term current use of insulin ([place])   [place] [facility] Kerin Perna, NP   1 year ago Type 2 diabetes mellitus with diabetic polyneuropathy, with long-term current use of insulin ([place])   [place] [facility] Kerin Perna, NP   1 year ago Essential hypertension   [place] Kerin Perna, NP   1 year ago Type 2 diabetes mellitus with diabetic polyneuropathy, with long-term current use of insulin [facility])   [place], RPH-CPP       Future Appointments             In 4 weeks Kerin Perna, NP [place]

## 2021-08-24 NOTE — Telephone Encounter (Unsigned)
Copied from [place]. Topic: General - Other >> Aug 24, 2021 10:01 AM Everette C wrote: Reason for CRM: Medication Refill - Medication: Rx #: [id number]  levothyroxine (SYNTHROID) 175 MCG tablet [[id number]]    Has the patient contacted their pharmacy? Yes.  The patient was directed to contact their PCP (Agent: If no, request that the patient contact the pharmacy for the refill. If patient does not wish to contact the pharmacy document the reason why and proceed with request.) (Agent: If yes, when and what did the pharmacy advise?)  Preferred Pharmacy (with phone number or street name): [facility] [address] [place] [address] Phone: [phone] Fax: [phone] Hours: M-F 7:30a-6:00p   Has the patient been seen for an appointment in the last year OR does the patient have an upcoming appointment? Yes.    Agent: Please be advised that RX refills may take up to 3 business days. We ask that you follow-up with your pharmacy.

## 2021-08-25 NOTE — Telephone Encounter (Signed)
Routed to PCP. Last TSH was in June. Does not appear medication was sent at that time

## 2021-08-28 ENCOUNTER — Other Ambulatory Visit (INDEPENDENT_AMBULATORY_CARE_PROVIDER_SITE_OTHER): Payer: Self-pay | Admitting: Primary Care

## 2021-08-28 ENCOUNTER — Other Ambulatory Visit: Payer: Self-pay | Admitting: Obstetrics and Gynecology

## 2021-08-28 DIAGNOSIS — E039 Hypothyroidism, unspecified: Secondary | ICD-10-CM

## 2021-08-28 MED ORDER — LEVOTHYROXINE SODIUM 175 MCG PO TABS
175.0000 ug | ORAL_TABLET | Freq: Every day | ORAL | 0 refills | Status: DC
  Filled 2021-08-28: qty 30, 30d supply, fill #0

## 2021-08-28 MED ORDER — LEVOTHYROXINE SODIUM 50 MCG PO TABS
50.0000 ug | ORAL_TABLET | Freq: Every day | ORAL | 3 refills | Status: DC
  Filled 2021-08-28: qty 90, 90d supply, fill #0

## 2021-08-29 ENCOUNTER — Other Ambulatory Visit: Payer: Self-pay

## 2021-08-31 ENCOUNTER — Other Ambulatory Visit: Payer: Self-pay

## 2021-09-01 ENCOUNTER — Other Ambulatory Visit (INDEPENDENT_AMBULATORY_CARE_PROVIDER_SITE_OTHER): Payer: Self-pay

## 2021-09-22 ENCOUNTER — Other Ambulatory Visit (HOSPITAL_COMMUNITY)
Admission: RE | Admit: 2021-09-22 | Discharge: 2021-09-22 | Disposition: A | Discharge status: Home / Self Care | Payer: Self-pay | Source: Ambulatory Visit | Attending: Primary Care | Admitting: Primary Care

## 2021-09-22 ENCOUNTER — Other Ambulatory Visit: Payer: Self-pay | Admitting: Pharmacist

## 2021-09-22 ENCOUNTER — Other Ambulatory Visit: Payer: Self-pay

## 2021-09-22 ENCOUNTER — Ambulatory Visit (INDEPENDENT_AMBULATORY_CARE_PROVIDER_SITE_OTHER): Payer: Self-pay | Admitting: Primary Care

## 2021-09-22 ENCOUNTER — Encounter (INDEPENDENT_AMBULATORY_CARE_PROVIDER_SITE_OTHER): Payer: Self-pay | Admitting: Primary Care

## 2021-09-22 VITALS — BP 129/86 | HR 74 | Temp 98.3°F | Ht 64.0 in | Wt 164.8 lb

## 2021-09-22 DIAGNOSIS — N898 Other specified noninflammatory disorders of vagina: Secondary | ICD-10-CM

## 2021-09-22 DIAGNOSIS — E039 Hypothyroidism, unspecified: Secondary | ICD-10-CM

## 2021-09-22 DIAGNOSIS — Z794 Long term (current) use of insulin: Secondary | ICD-10-CM

## 2021-09-22 DIAGNOSIS — E1142 Type 2 diabetes mellitus with diabetic polyneuropathy: Secondary | ICD-10-CM

## 2021-09-22 LAB — POCT GLYCOSYLATED HEMOGLOBIN (HGB A1C): Hemoglobin A1C: 11.2 % — AB (ref 4.0–5.6)

## 2021-09-22 MED ORDER — INSULIN GLARGINE 100 UNIT/ML SOLOSTAR PEN
10.0000 [IU] | PEN_INJECTOR | Freq: Two times a day (BID) | SUBCUTANEOUS | 11 refills | Status: DC
  Filled 2021-09-22: qty 6, 30d supply, fill #0
  Filled 2021-10-26: qty 6, 30d supply, fill #1
  Filled 2021-11-29: qty 6, 30d supply, fill #2

## 2021-09-22 MED ORDER — OZEMPIC (0.25 OR 0.5 MG/DOSE) 2 MG/3ML ~~LOC~~ SOPN
0.2500 mg | PEN_INJECTOR | SUBCUTANEOUS | 1 refills | Status: DC
  Filled 2021-09-22: qty 3, fill #0

## 2021-09-22 NOTE — Patient Instructions (Addendum)
Gripe en los adultos Influenza, Adult A la gripe tambin se la conoce como "influenza". Es una [employer], la nariz y la garganta (vas respiratorias). Se transmite fcilmente de persona a persona (es contagiosa). La gripe causa sntomas que son [employer] de un resfro, junto con fiebre alta y dolores corporales. Cules son las causas? La causa de esta afeccin es el virus de la influenza. Puede contraer el virus de las siguientes maneras: Al inhalar gotitas que quedan en el aire despus de que una persona infectada con gripe tosi o estornud. Al tocar algo que est contaminado con el virus y [employer] mano a la boca, la nariz o los ojos. Qu incrementa el riesgo? Hay ciertas cosas que lo pueden hacer ms propenso a Nurse, adult. Estas incluyen lo siguiente: No lavarse las manos con frecuencia. Tener contacto cercano con [employer] durante la temporada de resfro y gripe. Tocarse la boca, los ojos o la nariz sin antes lavarse las manos. No recibir la [employer]. Puede correr un mayor riesgo de [place] gripe, y [place] graves, como una infeccin pulmonar (neumona), si usted: Es mayor de 39 aos de edad. Est embarazada. Tiene debilitado el sistema que combate las defensas (sistema inmunitario) debido a una enfermedad o a que toma determinados medicamentos. Tiene una afeccin a largo plazo (crnica), como las siguientes: Enfermedad cardaca, renal o pulmonar. Diabetes. Asma. Tiene un trastorno heptico. Tiene mucho sobrepeso (obesidad [place]). Tiene anemia. Cules son los signos o sntomas? Los sntomas normalmente comienzan de repente y [place] [address] y [address] Pueden incluir los siguientes: Cristy Hilts y [place]. Dolores de [place], dolores en el cuerpo o dolores musculares. Dolor de Investment banker, operational. Tos. Secrecin o congestin nasal. Molestias en el pecho. No querer comer tanto como lo hace normalmente. Sensacin de debilidad o  cansancio. Mareos. Malestar estomacal o vmitos. Cmo se trata? Si la gripe se detecta de forma temprana, puede recibir tratamiento con medicamentos antivirales. Esto puede ayudar a reducir la gravedad y la duracin de la enfermedad. Se los administrarn por boca o a travs de un tubo (catter) intravenoso. Cuidarse en su hogar puede ayudar a que mejoren los sntomas. El mdico puede recomendarle lo siguiente: Tomar medicamentos de Radio broadcast assistant. Beber abundante lquido. La gripe suele desaparecer sola. Si tiene sntomas muy graves u otros problemas, puede recibir tratamiento en un hospital. Siga estas instrucciones en su casa:     Actividad Descanse todo lo que sea necesario. Duerma lo suficiente. Foy [place] en su casa y no concurra al [place] o a la escuela, como se lo haya indicado el mdico. No salga de su casa hasta que no haya tenido fiebre por 24 horas sin tomar medicamentos. Salga de su casa solamente para ir al [employer]. Comida y bebida Wyatt Haste SRO (solucin de rehidratacin oral). Es Ardelia Mems bebida que se vende en farmacias y tiendas. Beba suficiente lquido como para Theatre manager la orina de color amarillo plido. En la medida en que pueda, beba lquidos transparentes en pequeas cantidades. Los lquidos transparentes son, por ejemplo: Grayce Sessions. Trocitos de hielo. Jugo de frutas mezclado con agua. Bebidas deportivas de bajas caloras. Coma alimentos suaves que sean fciles de digerir. En la medida que pueda, consuma pequeas cantidades. Estos alimentos incluyen: Bananas. Pur de [employer]. Arroz. Carnes magras. Tostadas. Galletas. No coma ni beba lo siguiente: Lquidos con alto contenido de azcar o cafena. Alcohol. Alimentos condimentados o con alto contenido de [place]. Indicaciones generales Use los medicamentos de venta libre y los  recetados solamente como se lo haya indicado el mdico. Use un humidificador de aire fro para que el aire de su casa est ms hmedo. Esto puede  facilitar la respiracin. Cuando utilice un humidificador de vapor fro, lmpielo a diario. Vace el agua y [place] por agua limpia. Al toser o estornudar, cbrase la boca y la [place]. Lvese las manos frecuentemente con agua y [place] y durante al menos 20 segundos. Esto tambin es importante despus de toser o Brewing technologist. Si no dispone de [place] y [place], use desinfectante para manos con alcohol. Cumpla con todas las visitas de seguimiento. Cmo se previene?  Colquese la vacuna antigripal todos los [place]. Puede colocarse la vacuna contra la gripe a fines de verano, en otoo o en invierno. Pregntele al mdico cundo debe aplicarse la vacuna contra la gripe. Evite el contacto con personas que estn enfermas durante el otoo y el invierno. Es la temporada del resfro y Counsellor. Comunquese con un mdico si: Tiene sntomas nuevos. Tiene los siguientes sntomas: Dolor de [place]. Materia fecal lquida (diarrea). Cristy Hilts. La tos empeora. Empieza a tener ms mucosidad. Tiene Higher education careers adviser. Vomita. Solicite ayuda de inmediato si: Le falta el aire. Tiene dificultad para respirar. La piel o las uas se ponen de un color azulado. Presenta dolor muy intenso o rigidez en el cuello. Tiene dolor de cabeza repentino. Le duele la cara o el odo de forma repentina. No puede comer ni beber sin vomitar. Estos sntomas pueden representar un problema grave que constituye Engineer, maintenance (IT). Solicite atencin mdica de inmediato. Comunquese con el servicio de emergencias de su localidad (911 en los [place]). No espere a ver si los sntomas desaparecen. No conduzca por sus propios medios [employer] Financial. Resumen A la gripe tambin se la conoce como "influenza". Es una [employer], la nariz y Patent examiner. Se transmite fcilmente de [place] persona a otra. Use los medicamentos de venta libre y los recetados solamente como se lo haya indicado el mdico. Aplicarse la vacuna contra la gripe  todos los aos es la mejor manera de no contagiarse la gripe. Esta informacin no tiene Marine scientist el consejo del mdico. Asegrese de hacerle al mdico cualquier pregunta que tenga. Document Revised: 11/05/2019 Document Reviewed: 11/05/2019 Elsevier Patient Education  [place] Injection What is this medication? SEMAGLUTIDE (SEM a GLOO tide) treats type 2 diabetes. It works by increasing insulin levels in your body, which decreases your blood sugar (glucose). It also reduces the amount of sugar released into the blood and slows down your digestion. It can also be used to lower the risk of heart attack and stroke in people with type 2 diabetes. Changes to diet and exercise are often combined with this medication. This medicine may be used for other purposes; ask your health care provider or pharmacist if you have questions. COMMON BRAND NAME(S): OZEMPIC What should I tell my care team before I take this medication? They need to know if you have any of these conditions: Endocrine tumors (MEN 2) or if someone in your family had these tumors Eye disease, vision problems History of pancreatitis Kidney disease Stomach problems Thyroid cancer or if someone in your family had thyroid cancer An unusual or allergic reaction to semaglutide, other medications, foods, dyes, or preservatives Pregnant or trying to get pregnant Breast-feeding How should I use this medication? This medication is for injection under the skin of your upper leg (thigh), stomach area, or upper arm. It is given once  every week (every 7 days). You will be taught how to prepare and give this medication. Use exactly as directed. Take your medication at regular intervals. Do not take it more often than directed. If you use this medication with insulin, you should inject this medication and the insulin separately. Do not mix them together. Do not give the injections right next to each other. Change  (rotate) injection sites with each injection. It is important that you put your used needles and syringes in a special sharps container. Do not put them in a trash can. If you do not have a sharps container, call your pharmacist or care team to get one. A special MedGuide will be given to you by the pharmacist with each prescription and refill. Be sure to read this information carefully each time. This medication comes with INSTRUCTIONS FOR USE. Ask your pharmacist for directions on how to use this medication. Read the information carefully. Talk to your pharmacist or care team if you have questions. Talk to your care team about the use of this medication in children. Special care may be needed. Overdosage: If you think you have taken too much of this medicine contact a poison control center or emergency room at once. NOTE: This medicine is only for you. Do not share this medicine with others. What if I miss a dose? If you miss a dose, take it as soon as you can within 5 days after the missed dose. Then take your next dose at your regular weekly time. If it has been longer than 5 days after the missed dose, do not take the missed dose. Take the next dose at your regular time. Do not take double or extra doses. If you have questions about a missed dose, contact your care team for advice. What may interact with this medication? Other medications for diabetes Many medications may cause changes in blood sugar, these include: Alcohol containing beverages Antiviral medications for HIV or AIDS Aspirin and aspirin-like medications Certain medications for blood pressure, heart disease, irregular heart beat Chromium Diuretics Female hormones, such as estrogens or progestins, birth control pills Fenofibrate Gemfibrozil Isoniazid Lanreotide Female hormones or anabolic steroids MAOIs like Carbex, Eldepryl, Marplan, Nardil, and Parnate Medications for weight loss Medications for allergies, asthma, cold, or  cough Medications for depression, anxiety, or psychotic disturbances Niacin Nicotine NSAIDs, medications for pain and inflammation, like ibuprofen or naproxen Octreotide Pasireotide Pentamidine Phenytoin Probenecid Quinolone antibiotics such as ciprofloxacin, levofloxacin, ofloxacin Some herbal dietary supplements Steroid medications such as prednisone or cortisone Sulfamethoxazole; trimethoprim Thyroid hormones Some medications can hide the warning symptoms of low blood sugar (hypoglycemia). You may need to monitor your blood sugar more closely if you are taking one of these medications. These include: Beta-blockers, often used for high blood pressure or heart problems (examples include atenolol, metoprolol, propranolol) Clonidine Guanethidine Reserpine This list may not describe all possible interactions. Give your health care provider a list of all the medicines, herbs, non-prescription drugs, or dietary supplements you use. Also tell them if you smoke, drink alcohol, or use illegal drugs. Some items may interact with your medicine. What should I watch for while using this medication? Visit your care team for regular checks on your progress. Drink plenty of fluids while taking this medication. Check with your care team if you get an attack of severe diarrhea, nausea, and vomiting. The loss of too much body fluid can make it dangerous for you to take this medication. A test called the HbA1C (A1C)  will be monitored. This is a simple blood test. It measures your blood sugar control over the last 2 to 3 months. You will receive this test every 3 to 6 months. Learn how to check your blood sugar. Learn the symptoms of low and high blood sugar and how to manage them. Always carry a quick-source of sugar with you in case you have symptoms of low blood sugar. Examples include hard sugar candy or glucose tablets. Make sure others know that you can choke if you eat or drink when you develop serious  symptoms of low blood sugar, such as seizures or unconsciousness. They must get medical help at once. Tell your care team if you have high blood sugar. You might need to change the dose of your medication. If you are sick or exercising more than usual, you might need to change the dose of your medication. Do not skip meals. Ask your care team if you should avoid alcohol. Many nonprescription cough and cold products contain sugar or alcohol. These can affect blood sugar. Pens should never be shared. Even if the needle is changed, sharing may result in passing of viruses like hepatitis or HIV. Wear a medical ID bracelet or chain, and carry a card that describes your disease and details of your medication and dosage times. Do not become pregnant while taking this medication. Women should inform their care team if they wish to become pregnant or think they might be pregnant. There is a potential for serious side effects to an unborn child. Talk to your care team for more information. What side effects may I notice from receiving this medication? Side effects that you should report to your care team as soon as possible: Allergic reactions--skin rash, itching, hives, swelling of the face, lips, tongue, or throat Change in vision Dehydration--increased thirst, dry mouth, feeling faint or lightheaded, headache, dark yellow or brown urine Gallbladder problems--severe stomach pain, nausea, vomiting, fever Heart palpitations--rapid, pounding, or irregular heartbeat Kidney injury--decrease in the amount of urine, swelling of the ankles, hands, or feet Pancreatitis--severe stomach pain that spreads to your back or gets worse after eating or when touched, fever, nausea, vomiting Thyroid cancer--new mass or lump in the neck, pain or trouble swallowing, trouble breathing, hoarseness Side effects that usually do not require medical attention (report to your care team if they continue or are  bothersome): Diarrhea Loss of appetite Nausea Stomach pain Vomiting This list may not describe all possible side effects. Call your doctor for medical advice about side effects. You may report side effects to FDA at 1-800-FDA-1088. Where should I keep my medication? Keep out of the reach of children. Store unopened pens in a refrigerator between 2 and 8 degrees C (36 and 46 degrees F). Do not freeze. Protect from light and heat. After you first use the pen, it can be stored for 56 days at room temperature between 15 and 30 degrees C (59 and 86 degrees F) or in a refrigerator. Throw away your used pen after 56 days or after the expiration date, whichever comes first. Do not store your pen with the needle attached. If the needle is left on, medication may leak from the pen. NOTE: This sheet is a summary. It may not cover all possible information. If you have questions about this medicine, talk to your doctor, pharmacist, or health care provider.  2023 Elsevier/Gold Standard (2020-03-24 00:00:00)

## 2021-09-22 NOTE — Progress Notes (Unsigned)
Subjective:  Patient ID: Audrey Mcmillan, female    DOB: [birth year]  Age: 41 y.o. MRN: [id number]  CC: Diabetes (fasting)   HPI Audrey Mcmillan presents forFollow-up of diabetes. Patient does not check blood sugar at home . Management of hypothyroidism she denies  slow metabolism, fatigue, weight gain, dry hair/skin, constipation, swelling, decreased memory/brain fog.  Compliant with meds - Yes Checking CBGs? No  Fasting avg -   Postprandial average -  Exercising regularly? - Yes Watching carbohydrate intake? - Yes Neuropathy ? - No Hypoglycemic events - No  - Recovers with :   Pertinent ROS:  Polyuria - Yes Polydipsia - No Vision problems - No  Medications as noted below. Taking them regularly without complication/adverse reaction being reported today.   History Audrey Mcmillan has a past medical history of Diabetes mellitus without complication ([place]), High cholesterol, Hyperthyroidism, and Thyroid cancer ([place]).   She has a past surgical history that includes No past surgeries.   Her Family history is unknown by patient.She reports that she has never smoked. She has never used smokeless tobacco. She reports that she does not drink alcohol and does not use drugs.  Current Outpatient Medications on File Prior to Visit  Medication Sig Dispense Refill   levothyroxine (SYNTHROID) 50 MCG tablet Take 1 tablet (50 mcg total) by mouth daily. 90 tablet 3   NIFEdipine (PROCARDIA-XL/NIFEDICAL-XL) 30 MG 24 hr tablet Take 1 tablet (30 mg total) by mouth daily. 30 tablet 11   Blood Glucose Monitoring Suppl (TRUE METRIX METER) w/Device KIT Use as instructed 1 kit 0   glucose blood (TRUE METRIX BLOOD GLUCOSE TEST) test strip Use as instructed. Check blood glucose levels twice per day. 100 each 12   Insulin Pen Needle (TECHLITE PEN NEEDLES) 32G X 4 MM MISC Use to inject insulin up to 4 times daily as directed. 100 each 3   Insulin Syringe-Needle U-100 30G X 5/16" 1 ML MISC Inject 1  application into the skin 2 times daily at 12 noon and 4 pm. 100 each 5   TRUEplus Lancets 28G MISC Use as instructed. Check blood glucose levels twice per day. 100 each 3   No current facility-administered medications on file prior to visit.    ROS Review of Systems  Objective:  BP 129/86   Pulse 74   Temp 98.3 F (36.8 C) (Oral)   Ht 5' 4"  (1.626 m)   Wt 164 lb 12.8 oz (74.8 kg)   LMP 08/22/2021 (Approximate)   SpO2 97%   Breastfeeding No   BMI 28.29 kg/m   BP Readings from Last 3 Encounters:  09/22/21 129/86  06/23/21 131/80  01/18/21 117/76    Wt Readings from Last 3 Encounters:  09/22/21 164 lb 12.8 oz (74.8 kg)  06/23/21 169 lb 6.4 oz (76.8 kg)  01/18/21 161 lb 6.4 oz (73.2 kg)    Physical Exam Vitals:   09/22/21 1110  BP: 129/86  Pulse: 74  Temp: 98.3 F (36.8 C)  TempSrc: Oral  SpO2: 97%  Weight: 164 lb 12.8 oz (74.8 kg)  Height: 5' 4"  (1.626 m)   General: Vital signs reviewed.  Patient is well-developed and well-nourished, in no acute distress and cooperative with exam.  Head: Normocephalic and atraumatic. Eyes: EOMI, conjunctivae normal, no scleral icterus.  Neck: Supple, trachea midline, normal ROM, no JVD, masses, thyromegaly, or carotid bruit present.  Cardiovascular: RRR, S1 normal, S2 normal, no murmurs, gallops, or rubs. Pulmonary/Chest: Clear to auscultation bilaterally, no wheezes, rales, or  rhonchi. Abdominal: Soft, non-tender, non-distended, BS +, no masses, organomegaly, or guarding present.  Musculoskeletal: No joint deformities, erythema, or stiffness, ROM full and nontender. Extremities: No lower extremity edema bilaterally,  pulses symmetric and intact bilaterally. No cyanosis or clubbing. Neurological: A&O x3, Strength is normal and symmetric bilaterally, no focal motor deficit,  Skin: Warm, dry and intact. No rashes or erythema. Psychiatric: Normal mood and affect. speech and behavior is normal. Cognition and memory are normal.    Lab Results  Component Value Date   HGBA1C 11.2 (A) 09/22/2021   HGBA1C 12.6 (A) 06/23/2021   HGBA1C 6.5 (A) 01/02/2021    Lab Results  Component Value Date   WBC 7.9 06/23/2021   HGB 14.8 06/23/2021   HCT 44.0 06/23/2021   PLT 278 06/23/2021   GLUCOSE 233 (H) 06/23/2021   CHOL 462 (H) 06/23/2021   TRIG 355 (H) 06/23/2021   HDL 48 06/23/2021   LDLCALC 326 (H) 06/23/2021   ALT 24 06/23/2021   AST 30 06/23/2021   NA 134 06/23/2021   K 4.6 06/23/2021   CL 99 06/23/2021   CREATININE 1.11 (H) 06/23/2021   BUN 13 06/23/2021   CO2 21 06/23/2021   TSH 73.000 (H) 06/23/2021   HGBA1C 11.2 (A) 09/22/2021     Assessment & Plan:   Audrey Mcmillan was seen today for diabetes.  Diagnoses and all orders for this visit:  Type 2 diabetes mellitus with diabetic polyneuropathy, with long-term current use of insulin (HCC) -     HgB A1c 11.2 with very little improvement with A1c patient states she is compliant in taking her medications as prescribed. Discussed  co- morbidities with uncontrol diabetes  Complications -diabetic retinopathy, (close your eyes ? What do you see nothing) nephropathy decrease in kidney function- can lead to dialysis-on a machine 3 days a week to filter your kidney, neuropathy- numbness and tinging in your hands and feet,  increase risk of heart attack and stroke, and amputation due to decrease wound healing and circulation. Decrease your risk by taking medication daily as prescribed, monitor carbohydrates- foods that are high in carbohydrates are the following rice, potatoes, breads, sugars, and pastas.  Reduction in the intake (eating) will assist in lowering your blood sugars. Exercise daily at least 30 minutes daily.  Medication changes includes increasing Lantus from 10 units daily to Lantus 10 units twice daily and Ozempic.  She will follow-up with the clinical pharmacist in 4 to 6 weeks for any medication adjustments  Patient will have a follow-up with clinical pharmacist  to reevaluate diabetes management.  Increase Lantus to 10 units twice daily previously taking 10 units daily -     Comprehensive metabolic panel -     CBC with Differential/Platelet -     Lipid panel -     insulin glargine (LANTUS) 100 UNIT/ML Solostar Pen; Inject 10 Units into the skin 2 (two) times daily.  She will also start Ozempic weekly 0.25 mg  Acquired hypothyroidism Hypothyroidism can cause slow metabolism, fatigue, weight gain, dry hair/skin, constipation, swelling, decreased memory/brain fog. We are going to start you on a new medication for thyroid. Please take it on an empty stomach 81mns-1 hour before food to have the most effective absorption. I will be sending in levothyroxine   Mcg. Recheck lab only in one month.   -     TSH + free T4  Itching in the vaginal area -     Cervicovaginal ancillary only   I have changed MVerdis Frederickson  Santillan [name]'s insulin glargine. I am also having her maintain her True Metrix Meter, Insulin Syringe-Needle U-100, NIFEdipine, True Metrix Blood Glucose Test, TechLite Pen Needles, TRUEplus Lancets 28G, and levothyroxine.  Meds ordered this encounter  Medications   insulin glargine (LANTUS) 100 UNIT/ML Solostar Pen    Sig: Inject 10 Units into the skin 2 (two) times daily.    Dispense:  9 mL    Refill:  11    Order Specific Question:   Supervising Provider    Answer:   Tresa Garter [[id number]]     Follow-up:   Return in about 3 months (around 12/22/2021) for DM/HTN.  The above assessment and management plan was discussed with the patient. The patient verbalized understanding of and has agreed to the management plan. Patient is aware to call the clinic if symptoms fail to improve or worsen. Patient is aware when to return to the clinic for a follow-up visit. Patient educated on when it is appropriate to go to the emergency department.   Juluis Mire, NP-C

## 2021-09-23 LAB — CBC WITH DIFFERENTIAL/PLATELET
Basophils Absolute: 0.1 10*3/uL (ref 0.0–0.2)
Basos: 1 %
EOS (ABSOLUTE): 0.1 10*3/uL (ref 0.0–0.4)
Eos: 2 %
Hematocrit: 41.9 % (ref 34.0–46.6)
Hemoglobin: 14 g/dL (ref 11.1–15.9)
Immature Grans (Abs): 0 10*3/uL (ref 0.0–0.1)
Immature Granulocytes: 0 %
Lymphocytes Absolute: 2.4 10*3/uL (ref 0.7–3.1)
Lymphs: 38 %
MCH: 30 pg (ref 26.6–33.0)
MCHC: 33.4 g/dL (ref 31.5–35.7)
MCV: 90 fL (ref 79–97)
Monocytes Absolute: 0.3 10*3/uL (ref 0.1–0.9)
Monocytes: 5 %
Neutrophils Absolute: 3.6 10*3/uL (ref 1.4–7.0)
Neutrophils: 54 %
Platelets: 251 10*3/uL (ref 150–450)
RBC: 4.67 x10E6/uL (ref 3.77–5.28)
RDW: 12.2 % (ref 11.7–15.4)
WBC: 6.5 10*3/uL (ref 3.4–10.8)

## 2021-09-23 LAB — COMPREHENSIVE METABOLIC PANEL
ALT: 12 IU/L (ref 0–32)
AST: 12 IU/L (ref 0–40)
Albumin/Globulin Ratio: 1.6 (ref 1.2–2.2)
Albumin: 4.5 g/dL (ref 3.9–4.9)
Alkaline Phosphatase: 83 IU/L (ref 44–121)
BUN/Creatinine Ratio: 12 (ref 9–23)
BUN: 14 mg/dL (ref 6–20)
Bilirubin Total: 0.2 mg/dL (ref 0.0–1.2)
CO2: 20 mmol/L (ref 20–29)
Calcium: 9.9 mg/dL (ref 8.7–10.2)
Chloride: 103 mmol/L (ref 96–106)
Creatinine, Ser: 1.13 mg/dL — ABNORMAL HIGH (ref 0.57–1.00)
Globulin, Total: 2.8 g/dL (ref 1.5–4.5)
Glucose: 209 mg/dL — ABNORMAL HIGH (ref 70–99)
Potassium: 4.7 mmol/L (ref 3.5–5.2)
Sodium: 137 mmol/L (ref 134–144)
Total Protein: 7.3 g/dL (ref 6.0–8.5)
eGFR: 63 mL/min/{1.73_m2} (ref >59)

## 2021-09-23 LAB — LIPID PANEL
Chol/HDL Ratio: 8.9 ratio — ABNORMAL HIGH (ref 0.0–4.4)
Cholesterol, Total: 319 mg/dL — ABNORMAL HIGH (ref 100–199)
HDL: 36 mg/dL — ABNORMAL LOW (ref >39)
LDL Chol Calc (NIH): 229 mg/dL — ABNORMAL HIGH (ref 0–99)
Triglycerides: 263 mg/dL — ABNORMAL HIGH (ref 0–149)
VLDL Cholesterol Cal: 54 mg/dL — ABNORMAL HIGH (ref 5–40)

## 2021-09-23 LAB — TSH+FREE T4
Free T4: 0.57 ng/dL — ABNORMAL LOW (ref 0.82–1.77)
TSH: 54.5 u[IU]/mL — ABNORMAL HIGH (ref 0.450–4.500)

## 2021-09-26 ENCOUNTER — Other Ambulatory Visit (INDEPENDENT_AMBULATORY_CARE_PROVIDER_SITE_OTHER): Payer: Self-pay | Admitting: Primary Care

## 2021-09-26 ENCOUNTER — Other Ambulatory Visit: Payer: Self-pay

## 2021-09-26 DIAGNOSIS — E039 Hypothyroidism, unspecified: Secondary | ICD-10-CM

## 2021-09-26 MED ORDER — LEVOTHYROXINE SODIUM 88 MCG PO TABS
88.0000 ug | ORAL_TABLET | Freq: Every day | ORAL | 1 refills | Status: DC
  Filled 2021-09-26: qty 30, 30d supply, fill #0
  Filled 2021-10-26: qty 30, 30d supply, fill #1
  Filled 2021-11-29: qty 30, 30d supply, fill #2
  Filled 2022-01-03: qty 30, 30d supply, fill #3

## 2021-09-27 ENCOUNTER — Other Ambulatory Visit (INDEPENDENT_AMBULATORY_CARE_PROVIDER_SITE_OTHER): Payer: Self-pay | Admitting: Primary Care

## 2021-09-27 ENCOUNTER — Other Ambulatory Visit: Payer: Self-pay

## 2021-09-27 ENCOUNTER — Telehealth (INDEPENDENT_AMBULATORY_CARE_PROVIDER_SITE_OTHER): Payer: Self-pay | Admitting: *Deleted

## 2021-09-27 DIAGNOSIS — E7841 Elevated Lipoprotein(a): Secondary | ICD-10-CM

## 2021-09-27 MED ORDER — ERGOCALCIFEROL 1.25 MG (50000 UT) PO CAPS
50000.0000 [IU] | ORAL_CAPSULE | ORAL | 1 refills | Status: DC
  Filled 2021-09-27: qty 4, 28d supply, fill #0

## 2021-09-27 MED ORDER — ATORVASTATIN CALCIUM 80 MG PO TABS
80.0000 mg | ORAL_TABLET | Freq: Every day | ORAL | 3 refills | Status: DC
  Filled 2021-09-27: qty 90, 90d supply, fill #0
  Filled 2022-01-03: qty 90, 90d supply, fill #1

## 2021-09-27 NOTE — Telephone Encounter (Signed)
Results not back from vaginal swab.  Also lab results state atorvastatin sent however it was not. Please send to patient pharmacy.

## 2021-09-27 NOTE — Telephone Encounter (Signed)
Patient called for lab results and was notified: Your cholesterol is high, Increase risk of heart attack and/or stroke.  To reduce your Cholesterol , Remember - more fruits and vegetables, more fish, and limit red meat and dairy products.  More soy, nuts, beans, barley, lentils, oats and plant sterol ester enriched margarine instead of butter.  I also encourage eliminating sugar and processed food.  New script sent for atorvastatin 80 mg take at bedtime. Thyroid levels are not controlled . Please take it on an empty stomach 30mns-1 hour before food to have the most effective absorption. . Increased levothyroxine 88 mcg.  Patient states she has a lot of itching and would like the results of her vaginal swab- advised not back- patient request follow up- call lab because it has been so long.

## 2021-09-28 ENCOUNTER — Other Ambulatory Visit: Payer: Self-pay

## 2021-09-29 ENCOUNTER — Other Ambulatory Visit: Payer: Self-pay

## 2021-09-29 LAB — CERVICOVAGINAL ANCILLARY ONLY
Bacterial Vaginitis (gardnerella): NEGATIVE
Candida Glabrata: NEGATIVE
Candida Vaginitis: POSITIVE — AB
Chlamydia: NEGATIVE
Comment: NEGATIVE
Comment: NEGATIVE
Comment: NEGATIVE
Comment: NEGATIVE
Comment: NEGATIVE
Comment: NORMAL
Neisseria Gonorrhea: NEGATIVE
Trichomonas: NEGATIVE

## 2021-10-01 ENCOUNTER — Other Ambulatory Visit (INDEPENDENT_AMBULATORY_CARE_PROVIDER_SITE_OTHER): Payer: Self-pay | Admitting: Primary Care

## 2021-10-01 MED ORDER — FLUCONAZOLE 150 MG PO TABS
150.0000 mg | ORAL_TABLET | Freq: Every day | ORAL | 1 refills | Status: DC
  Filled 2021-10-01: qty 1, 1d supply, fill #0

## 2021-10-02 ENCOUNTER — Other Ambulatory Visit: Payer: Self-pay

## 2021-10-09 ENCOUNTER — Other Ambulatory Visit: Payer: Self-pay

## 2021-10-21 ENCOUNTER — Ambulatory Visit
Admission: RE | Admit: 2021-10-21 | Discharge: 2021-10-21 | Disposition: A | Discharge status: Home / Self Care | Payer: No Typology Code available for payment source | Source: Ambulatory Visit | Attending: Urgent Care | Admitting: Urgent Care

## 2021-10-21 VITALS — BP 152/71 | HR 73 | Temp 98.1°F | Resp 16

## 2021-10-21 DIAGNOSIS — Z794 Long term (current) use of insulin: Secondary | ICD-10-CM

## 2021-10-21 DIAGNOSIS — N760 Acute vaginitis: Secondary | ICD-10-CM

## 2021-10-21 DIAGNOSIS — E119 Type 2 diabetes mellitus without complications: Secondary | ICD-10-CM

## 2021-10-21 MED ORDER — FLUCONAZOLE 150 MG PO TABS
150.0000 mg | ORAL_TABLET | ORAL | 0 refills | Status: DC
  Filled 2021-10-21: qty 4, 12d supply, fill #0

## 2021-10-21 MED ORDER — FLUCONAZOLE 150 MG PO TABS: 150.0000 mg | ORAL_TABLET | ORAL | 0 refills | Status: DC

## 2021-10-21 NOTE — ED Provider Notes (Signed)
[facility]  Note:  This document was prepared using Systems analyst and may include unintentional dictation errors.  MRN: [id number] DOB: [birth year]  Subjective:   Audrey Mcmillan is a 41 y.o. female presenting for several month history of persistent vaginal itching and redness. Symptoms are intermittent, this past episode has happened over the past month. Has a history of vaginal itching. Reports that it clusters over like dry scaly skin. Symptoms started in her 3rd trimester in pregnancy at the end of 2022. Has type 2 DM, treated with insulin.  Diabetes is poorly controlled.  Patient is making efforts at dietary changes.  She does have a PCP that she can follow-up with.  No current facility-administered medications for this encounter.  Current Outpatient Medications:    atorvastatin (LIPITOR) 80 MG tablet, Take 1 tablet (80 mg total) by mouth daily., Disp: 90 tablet, Rfl: 3   Blood Glucose Monitoring Suppl (TRUE METRIX METER) w/Device KIT, Use as instructed, Disp: 1 kit, Rfl: 0   ergocalciferol (VITAMIN D2) 1.25 MG (50000 UT) capsule, Take 1 capsule (50,000 Units total) by mouth once a week., Disp: 4 capsule, Rfl: 1   fluconazole (DIFLUCAN) 150 MG tablet, Take 1 tablet (150 mg total) by mouth daily., Disp: 1 tablet, Rfl: 1   glucose blood (TRUE METRIX BLOOD GLUCOSE TEST) test strip, Use as instructed. Check blood glucose levels twice per day., Disp: 100 each, Rfl: 12   insulin glargine (LANTUS) 100 UNIT/ML Solostar Pen, Inject 10 Units into the skin 2 (two) times daily., Disp: 9 mL, Rfl: 11   Insulin Pen Needle (TECHLITE PEN NEEDLES) 32G X 4 MM MISC, Use to inject insulin up to 4 times daily as directed., Disp: 100 each, Rfl: 3   Insulin Syringe-Needle U-100 30G X 5/16" 1 ML MISC, Inject 1 application into the skin 2 times daily at 12 noon and 4 pm., Disp: 100 each, Rfl: 5   levothyroxine (SYNTHROID) 88 MCG tablet, Take 1 tablet (88 mcg  total) by mouth daily., Disp: 60 tablet, Rfl: 1   NIFEdipine (PROCARDIA-XL/NIFEDICAL-XL) 30 MG 24 hr tablet, Take 1 tablet (30 mg total) by mouth daily., Disp: 30 tablet, Rfl: 11   Semaglutide,0.25 or 0.5MG/DOS, (OZEMPIC, 0.25 OR 0.5 MG/DOSE,) 2 MG/3ML SOPN, Inject 0.25 mg into the skin once a week., Disp: 3 mL, Rfl: 1   TRUEplus Lancets 28G MISC, Use as instructed. Check blood glucose levels twice per day., Disp: 100 each, Rfl: 3   No Known Allergies  Past Medical History:  Diagnosis Date   Diabetes mellitus without complication (HCC)    type 2   High cholesterol    Hyperthyroidism    Thyroid cancer ([place])      Past Surgical History:  Procedure Laterality Date   NO PAST SURGERIES      Family History  Family history unknown: Yes    Social History   Tobacco Use   Smoking status: Never   Smokeless tobacco: Never  Vaping Use   Vaping Use: Never used  Substance Use Topics   Alcohol use: No    Comment: occ   Drug use: No    ROS   Objective:   Vitals: BP (!) 152/71 (BP Location: Left Arm)   Pulse 73   Temp 98.1 F (36.7 C) (Oral)   Resp 16   LMP 09/22/2021 (Approximate)   SpO2 98%   Physical Exam Constitutional:      General: She is not in acute distress.  Appearance: Normal appearance. She is well-developed. She is not ill-appearing, toxic-appearing or diaphoretic.  HENT:     Head: Normocephalic and atraumatic.     Nose: Nose normal.     Mouth/Throat:     Mouth: Mucous membranes are moist.  Eyes:     General: No scleral icterus.       Right eye: No discharge.        Left eye: No discharge.     Extraocular Movements: Extraocular movements intact.  Cardiovascular:     Rate and Rhythm: Normal rate.  Pulmonary:     Effort: Pulmonary effort is normal.  Skin:    General: Skin is warm and dry.  Neurological:     General: No focal deficit present.     Mental Status: She is alert and oriented to person, place, and time.  Psychiatric:        Mood and  Affect: Mood normal.        Behavior: Behavior normal.        Thought Content: Thought content normal.        Judgment: Judgment normal.    Patient declined pelvic exam.  Assessment and Plan :   PDMP not reviewed this encounter.  1. Acute vaginitis   2. Type 2 diabetes mellitus treated with insulin ([place])     Chart review shows that patient tested positive for yeast vaginitis at the beginning of September and unfortunately she was not aware of her results.  She did not pick up her prescription.  Given her diabetes and a positive test for yeast vaginitis recommended treating with oral fluconazole.  Discussed diabetic friendly foods.  Deferred vaginal swab today. Counseled patient on potential for adverse effects with medications prescribed/recommended today, ER and return-to-clinic precautions discussed, patient verbalized understanding.    Jaynee Eagles, PA-C 10/21/21 1400

## 2021-10-21 NOTE — ED Triage Notes (Signed)
Pt states vaginal itching and rash on and off for over a month.

## 2021-10-23 ENCOUNTER — Other Ambulatory Visit: Payer: Self-pay

## 2021-10-27 ENCOUNTER — Other Ambulatory Visit: Payer: Self-pay

## 2021-10-27 ENCOUNTER — Ambulatory Visit: Payer: Self-pay | Attending: Internal Medicine | Admitting: Pharmacist

## 2021-10-27 DIAGNOSIS — E1142 Type 2 diabetes mellitus with diabetic polyneuropathy: Secondary | ICD-10-CM

## 2021-10-27 DIAGNOSIS — Z794 Long term (current) use of insulin: Secondary | ICD-10-CM

## 2021-10-27 MED ORDER — TRULICITY 0.75 MG/0.5ML ~~LOC~~ SOAJ
0.7500 mg | SUBCUTANEOUS | 1 refills | Status: DC
  Filled 2021-10-27: qty 2, 28d supply, fill #0

## 2021-10-27 NOTE — Progress Notes (Signed)
    S:    PCP: Sharyn Lull   No chief complaint on file.  Patient arrives well and in good spirits.  Presents for diabetes evaluation, education, and management. Patient was referred and last seen by Primary Care Provider, Juluis Mire, on 09/22/2021. Ozempic was started at that visit.    Today, patient denies adherence to medications. Was not able to get the Ozempic. It was not approved.   Family/Social History: nonsmoker  Insurance coverage/medication affordability: Self pay  Medication adherence reported appropriately. Current diabetes medications include: Lantus 10 units BID, Ozempic 0.25 mg weekly   Patient denies hypoglycemic events.  Patient reported dietary habits: Eats 3 meals/day -No changes in her diet.  -She tries to limit sweets and sugars but does not limit carbs or starches   Patient-reported exercise habits:  -none reported   Patient denies nocturia (nighttime urination).  Patient denies neuropathy (nerve pain). Patient denies visual changes. Patient reports self foot exams.     O:   Lab Results  Component Value Date   HGBA1C 11.2 (A) 09/22/2021   There were no vitals filed for this visit.  Lipid Panel     Component Value Date/Time   CHOL 319 (H) 09/22/2021 1134   TRIG 263 (H) 09/22/2021 1134   HDL 36 (L) 09/22/2021 1134   CHOLHDL 8.9 (H) 09/22/2021 1134   LDLCALC 229 (H) 09/22/2021 1134    Home fasting blood sugars: 180s - 200s per patient. No home meter or CGM in place.  Clinical Atherosclerotic Cardiovascular Disease (ASCVD): No  The ASCVD Risk score (Arnett DK, et al., 2019) failed to calculate for the following reasons:   The 2019 ASCVD risk score is only valid for ages 50 to 24    A/P: Diabetes longstanding currently uncontrolled. Patient is able to verbalize appropriate hypoglycemia management plan. Medication adherence is suboptimal. Will send for Trulicity to replace Ozempic. She can get this via [place]  but change to 20units once daily in the morning.  -Start Trulicity 7.68 mg once weekly.  -Extensively discussed pathophysiology of diabetes, recommended lifestyle interventions, dietary effects on blood sugar control -Counseled on s/sx of and management of hypoglycemia -Next A1C anticipated 12/2021  Written patient instructions provided.  Total time in face to face counseling 20 minutes.   Follow up Pharmacist Clinic Visit in one month.   Benard Halsted, PharmD, [facility] [place], [place] [phone]

## 2021-10-31 ENCOUNTER — Other Ambulatory Visit: Payer: Self-pay

## 2021-11-30 ENCOUNTER — Other Ambulatory Visit: Payer: Self-pay

## 2021-12-05 ENCOUNTER — Ambulatory Visit: Payer: Self-pay | Attending: Internal Medicine | Admitting: Pharmacist

## 2021-12-05 ENCOUNTER — Encounter: Payer: Self-pay | Admitting: Pharmacist

## 2021-12-05 ENCOUNTER — Other Ambulatory Visit: Payer: Self-pay

## 2021-12-05 DIAGNOSIS — E1142 Type 2 diabetes mellitus with diabetic polyneuropathy: Secondary | ICD-10-CM

## 2021-12-05 DIAGNOSIS — Z794 Long term (current) use of insulin: Secondary | ICD-10-CM

## 2021-12-05 MED ORDER — INSULIN GLARGINE 100 UNIT/ML SOLOSTAR PEN
15.0000 [IU] | PEN_INJECTOR | Freq: Every day | SUBCUTANEOUS | 2 refills | Status: DC
  Filled 2021-12-05: qty 6, 40d supply, fill #0
  Filled 2022-01-23 – 2022-02-01 (×2): qty 6, 40d supply, fill #1
  Filled 2022-04-04: qty 6, 40d supply, fill #2

## 2021-12-05 MED ORDER — TRULICITY 1.5 MG/0.5ML ~~LOC~~ SOAJ
1.5000 mg | SUBCUTANEOUS | 3 refills | Status: DC
  Filled 2021-12-05: qty 2, 28d supply, fill #0
  Filled 2022-01-03: qty 2, 28d supply, fill #1
  Filled 2022-01-29 – 2022-01-30 (×2): qty 2, 28d supply, fill #2
  Filled 2022-03-01 – 2022-03-02 (×2): qty 2, 28d supply, fill #3

## 2021-12-05 NOTE — Progress Notes (Signed)
    S:    PCP: Sharyn Lull   No chief complaint on file.  Patient arrives well and in good spirits.  Presents for diabetes evaluation, education, and management. Patient was referred and last seen by Primary Care Provider, Juluis Mire, on 09/22/2021. Ozempic was started at that visit. I changed this to Trulicity last month as she was unable to start Ozempic d/t a patient assistance issue.   Today, patient reports adherence to medications. Completed 4 injections of Trulicity since last visit and tolerated it well. Denies NV, abdominal pain. No changes in vision.   Family/Social History: nonsmoker  Insurance coverage/medication affordability: Self pay  Medication adherence reported. Current diabetes medications include: Lantus 20 units daily, Trulicity 3.15 mg weekly   Patient denies hypoglycemic events.  Patient reported dietary habits: Eats 3 meals/day -Has incorporated more non-starchy vegetables in her diet since last visit.  -Limiting sweets, carbs, and sugar sweetened beverages  Patient-reported exercise habits:  -none    Patient denies nocturia (nighttime urination).  Patient denies neuropathy (nerve pain). Patient denies visual changes. Patient reports self foot exams.     O:   Lab Results  Component Value Date   HGBA1C 11.2 (A) 09/22/2021   There were no vitals filed for this visit.  Lipid Panel     Component Value Date/Time   CHOL 319 (H) 09/22/2021 1134   TRIG 263 (H) 09/22/2021 1134   HDL 36 (L) 09/22/2021 1134   CHOLHDL 8.9 (H) 09/22/2021 1134   LDLCALC 229 (H) 09/22/2021 1134    Home fasting blood sugars: 98 - 128 since adding Trulicity per patient. No home meter or CGM in place.  Clinical Atherosclerotic Cardiovascular Disease (ASCVD): No  The 10-year ASCVD risk score (Arnett DK, et al., 2019) is: 12%   Values used to calculate the score:     Age: 51 years     Sex: Female     Is Non-Hispanic African American: No     Diabetic: Yes     Tobacco  smoker: No     Systolic Blood Pressure: 400 mmHg     Is BP treated: Yes     HDL Cholesterol: 36 mg/dL     Total Cholesterol: 319 mg/dL    A/P: Diabetes longstanding currently uncontrolled ,however, reported home CBG values are excellent! Commended patient on this. Patient is able to verbalize appropriate hypoglycemia management plan. Medication adherence is now optimal -Decrease Basaglar to 15 units once daily in the morning.  -Increase Trulicity to 1.5 mg once weekly.  -Extensively discussed pathophysiology of diabetes, recommended lifestyle interventions, dietary effects on blood sugar control -Counseled on s/sx of and management of hypoglycemia -Next A1C anticipated 12/2021  Written patient instructions provided.  Total time in face to face counseling 20 minutes.   Follow up PCP Clinic Visit in one month.   Benard Halsted, PharmD, [facility] [place], [place] [phone]

## 2021-12-22 ENCOUNTER — Ambulatory Visit (INDEPENDENT_AMBULATORY_CARE_PROVIDER_SITE_OTHER): Payer: Self-pay | Admitting: Primary Care

## 2022-01-04 ENCOUNTER — Ambulatory Visit (INDEPENDENT_AMBULATORY_CARE_PROVIDER_SITE_OTHER): Payer: Self-pay | Admitting: Primary Care

## 2022-01-04 ENCOUNTER — Other Ambulatory Visit: Payer: Self-pay

## 2022-01-05 ENCOUNTER — Other Ambulatory Visit: Payer: Self-pay

## 2022-01-24 ENCOUNTER — Other Ambulatory Visit: Payer: Self-pay

## 2022-01-30 ENCOUNTER — Other Ambulatory Visit: Payer: Self-pay

## 2022-02-01 ENCOUNTER — Other Ambulatory Visit: Payer: Self-pay

## 2022-02-05 ENCOUNTER — Encounter (INDEPENDENT_AMBULATORY_CARE_PROVIDER_SITE_OTHER): Payer: Self-pay | Admitting: Primary Care

## 2022-02-05 ENCOUNTER — Other Ambulatory Visit: Payer: Self-pay

## 2022-02-05 ENCOUNTER — Ambulatory Visit (INDEPENDENT_AMBULATORY_CARE_PROVIDER_SITE_OTHER): Payer: Self-pay | Admitting: Primary Care

## 2022-02-05 VITALS — BP 128/85 | HR 89 | Resp 16 | Ht 62.0 in | Wt 154.8 lb

## 2022-02-05 DIAGNOSIS — Z794 Long term (current) use of insulin: Secondary | ICD-10-CM

## 2022-02-05 DIAGNOSIS — E785 Hyperlipidemia, unspecified: Secondary | ICD-10-CM

## 2022-02-05 DIAGNOSIS — E119 Type 2 diabetes mellitus without complications: Secondary | ICD-10-CM

## 2022-02-05 DIAGNOSIS — E1142 Type 2 diabetes mellitus with diabetic polyneuropathy: Secondary | ICD-10-CM

## 2022-02-05 DIAGNOSIS — I10 Essential (primary) hypertension: Secondary | ICD-10-CM

## 2022-02-05 DIAGNOSIS — Z23 Encounter for immunization: Secondary | ICD-10-CM

## 2022-02-05 DIAGNOSIS — E039 Hypothyroidism, unspecified: Secondary | ICD-10-CM

## 2022-02-05 DIAGNOSIS — E559 Vitamin D deficiency, unspecified: Secondary | ICD-10-CM

## 2022-02-05 DIAGNOSIS — O99280 Endocrine, nutritional and metabolic diseases complicating pregnancy, unspecified trimester: Secondary | ICD-10-CM

## 2022-02-05 LAB — POCT GLYCOSYLATED HEMOGLOBIN (HGB A1C): HbA1c, POC (controlled diabetic range): 7.5 % — AB (ref 0.0–7.0)

## 2022-02-05 MED ORDER — EMPAGLIFLOZIN 10 MG PO TABS
10.0000 mg | ORAL_TABLET | Freq: Every day | ORAL | 1 refills | Status: DC
  Filled 2022-02-05: qty 90, 90d supply, fill #0

## 2022-02-05 NOTE — Progress Notes (Signed)
[place], is a 42 y.o. female  [id number]:[id number]  [id number]  DOB - [birth year]  No chief complaint on file.      Subjective:   [name] is a 42 y.o. female here today for a follow up visit. Patient has No headache, No chest pain, No abdominal pain - No Nausea, No new weakness tingling or numbness, No Cough - shortness of breath.  Patient denies polyuria, polydipsia, polyphasia or vision changes.  She does voice concerns with feeling tired all the time she also has hypothyroidism and that will be checked at this appointment and CBC.  No problems updated.  No Known Allergies  Past Medical History:  Diagnosis Date   Diabetes mellitus without complication (HCC)    type 2   High cholesterol    Hyperthyroidism    Thyroid cancer ([place])     Current Outpatient Medications on File Prior to Visit  Medication Sig Dispense Refill   atorvastatin (LIPITOR) 80 MG tablet Take 1 tablet (80 mg total) by mouth daily. 90 tablet 3   Blood Glucose Monitoring Suppl (TRUE METRIX METER) w/Device KIT Use as instructed 1 kit 0   Dulaglutide (TRULICITY) 1.5 WP/7.9YI SOPN Inject 1.5 mg into the skin once a week. 2 mL 3   ergocalciferol (VITAMIN D2) 1.25 MG (50000 UT) capsule Take 1 capsule (50,000 Units total) by mouth once a week. 4 capsule 1   fluconazole (DIFLUCAN) 150 MG tablet Take 1 tablet (150 mg total) by mouth every 3 (three) days. 4 tablet 0   glucose blood (TRUE METRIX BLOOD GLUCOSE TEST) test strip Use as instructed. Check blood glucose levels twice per day. 100 each 12   insulin glargine (LANTUS) 100 UNIT/ML Solostar Pen Inject 15 Units into the skin daily. 6 mL 2   Insulin Pen Needle (TECHLITE PEN NEEDLES) 32G X 4 MM MISC Use to inject insulin up to 4 times daily as directed. 100 each 3   Insulin Syringe-Needle U-100 30G X 5/16" 1 ML MISC Inject 1 application into the skin 2 times daily at 12 noon and 4 pm. 100 each 5    levothyroxine (SYNTHROID) 88 MCG tablet Take 1 tablet (88 mcg total) by mouth daily. 60 tablet 1   NIFEdipine (PROCARDIA-XL/NIFEDICAL-XL) 30 MG 24 hr tablet Take 1 tablet (30 mg total) by mouth daily. 30 tablet 11   TRUEplus Lancets 28G MISC Use as instructed. Check blood glucose levels twice per day. 100 each 3   No current facility-administered medications on file prior to visit.    Objective:   Vitals:   02/05/22 0855  BP: 128/85  Pulse: 89  Resp: 16  SpO2: 100%  Weight: 154 lb 12.8 oz (70.2 kg)  Height: '5\' 2"'$  (1.575 m)    Exam General appearance : Awake, alert, not in any distress. Speech Clear. Not toxic looking HEENT: Atraumatic and Normocephalic, pupils equally reactive to light and accomodation Neck: Supple, no JVD. No cervical lymphadenopathy.  Chest: Good air entry bilaterally, no added sounds  CVS: S1 S2 regular, no murmurs.  Abdomen: Bowel sounds present, Non tender and not distended with no gaurding, rigidity or rebound. Extremities: B/L Lower Ext shows no edema, both legs are warm to touch Neurology: Awake alert, and oriented X 3,  Non focal Skin: No Rash  Data Review Lab Results  Component Value Date   HGBA1C 7.5 (A) 02/05/2022   HGBA1C 11.2 (A) 09/22/2021   HGBA1C 12.6 (A) 06/23/2021    Assessment &  Plan   1. Type 2 diabetes mellitus with diabetic polyneuropathy, with long-term current use of insulin (HCC) A1c has improved from 11.2-7.5 very pleased with the results will add Jardiance 10 mg take daily to achieve A1c less than 7. - educated on lifestyle modifications, including but not limited to diet choices and adding exercise to daily routine.   - POCT glycosylated hemoglobin (Hb A1C) - Microalbumin / creatinine urine ratio - Ambulatory referral to Ophthalmology - CBC with Differential/Platelet  2. Need for immunization against influenza - Flu Vaccine QUAD 80moIM (Fluarix, Fluzone & Alfiuria Quad PF)  3. Comprehensive diabetic foot examination, type  2 DM, encounter for (HTraver  4. Essential hypertension Blood pressure is very close to goal 128/85.  Explained that having normal blood pressure is the goal and medications are helping to get to goal and maintain normal blood pressure. DIET: Limit salt intake, read nutrition labels to check salt content, limit fried and high fatty foods  Avoid using multisymptom OTC cold preparations that generally contain sudafed which can rise BP. Consult with pharmacist on best cold relief products to use for persons with HTN EXERCISE Discussed incorporating exercise such as walking - 30 minutes most days of the week and can do in 10 minute intervals    - Comprehensive metabolic panel  5. Hyperlipidemia, unspecified hyperlipidemia type  Healthy lifestyle diet of fruits vegetables fish nuts whole grains and low saturated fat . Foods high in cholesterol or liver, fatty meats,cheese, butter avocados, nuts and seeds, chocolate and fried foods. - Lipid panel  6. Hypothyroidism during pregnancy, antepartum .  She does feel fatigued and currently on levothyroxine 88 mcg daily on an empty stomach she admits to not missing any doses.  Will check thyroid level today and CBC - TSH - T4, free  7. Vitamin D deficiency Fatigue tired - VITAMIN D 25 Hydroxy (Vit-D Deficiency, Fractures)    Patient have been counseled extensively about nutrition and exercise. Other issues discussed during this visit include: low cholesterol diet, weight control and daily exercise, foot care, annual eye examinations at Ophthalmology, importance of adherence with medications and regular follow-up. We also discussed long term complications of uncontrolled diabetes and hypertension.   No follow-ups on file.  The patient was given clear instructions to go to ER or return to medical center if symptoms don't improve, worsen or new problems develop. The patient verbalized understanding. The patient was told to call to get lab results if they  haven't heard anything in the next week.   This note has been created with DSurveyor, quantity Any transcriptional errors are unintentional.   MKerin Perna NP 02/05/2022, 9:20 AM

## 2022-02-05 NOTE — Patient Instructions (Signed)
Recuento de caloras para bajar de peso Calorie Counting for Weight Loss Las caloras son unidades de energa. El cuerpo necesita una cierta cantidad de caloras de los alimentos para que lo ayuden a funcionar durante todo el da. Cuando se comen o beben ms caloras de las que el cuerpo necesita, este acumula las caloras adicionales mayormente como grasa. Cuando se comen o beben menos caloras de las que el cuerpo necesita, este quema grasa para obtener la energa que necesita. El recuento de caloras es el registro de la cantidad de caloras que se comen y beben cada da. El recuento de caloras puede ser de ayuda si necesita perder peso. Si come menos caloras de las que el cuerpo necesita, debera bajar de peso. Pregntele al mdico cul es un peso sano para usted. Para que el recuento de caloras funcione, usted tendr que ingerir la cantidad de caloras adecuadas cada da, para bajar una cantidad de peso saludable por semana. Un nutricionista puede ayudar a determinar la cantidad de caloras que usted necesita por da y sugerirle formas de alcanzar su objetivo calrico. Una cantidad de peso saludable para bajar cada semana suele ser entre 1 y 2 libras (0.5 a 0.9 kg). Esto habitualmente significa que su ingesta diaria de caloras se debera reducir en unas 500 a 750 caloras. Ingerir de 1200 a 1500 caloras por da puede ayudar a la mayora de las mujeres a bajar de peso. Ingerir de 1500 a 1800 caloras por da puede ayudar a la mayora de los hombres a bajar de peso. Qu debo saber acerca del recuento de caloras? Trabaje con el mdico o el nutricionista para determinar cuntas caloras debe recibir cada da. A fin de alcanzar su objetivo diario de caloras, tendr que: Averiguar cuntas caloras hay en cada alimento que le gustara comer. Intente hacerlo antes de comer. Decidir la cantidad que puede comer del alimento. Llevar un registro de los alimentos. Para esto, anote lo que comi y cuntas  caloras tena. Para perder peso con xito, es importante equilibrar el recuento de caloras con un estilo de vida saludable que incluya actividad fsica de forma regular. Dnde encuentro informacin sobre las caloras?  Es posible encontrar la cantidad de caloras que contiene un alimento en la etiqueta de informacin nutricional. Si un alimento no tiene una etiqueta de informacin nutricional, intente buscar las caloras en Internet o pida ayuda al nutricionista. Recuerde que las caloras se calculan por porcin. Si opta por comer ms de una porcin de un alimento, tendr que multiplicar las caloras de una porcin por la cantidad de porciones que planea comer. Por ejemplo, la etiqueta de un envase de pan puede decir que el tamao de una porcin es 1 rodaja, y que una porcin tiene 90 caloras. Si come 1 rodaja, habr comido 90 caloras. Si come 2 rodajas, habr comido 180 caloras. Cmo llevo un registro de comidas? Despus de cada vez que coma, anote lo siguiente en el registro de alimentos lo antes posible: Lo que comi. Asegrese de incluir los aderezos, las salsas y otros extras en los alimentos. La cantidad que comi. Esto se puede medir en tazas, onzas o cantidad de alimentos. Cuntas caloras haba en cada alimento y en cada bebida. La cantidad total de caloras en la comida que tom. Tenga a mano el registro de alimentos, por ejemplo, en un anotador de bolsillo o utilice una aplicacin o sitio web en el telfono mvil. Algunos programas calcularn las caloras por usted y le mostrarn la cantidad de   caloras que le quedan para llegar al objetivo diario. Cules son algunos consejos para controlar las porciones? Sepa cuntas caloras hay en una porcin. Esto lo ayudar a saber cuntas porciones de un alimento determinado puede comer. Use una taza medidora para medir los tamaos de las porciones. Tambin puede intentar pesar las porciones en una balanza de cocina. Con el tiempo, podr hacer  un clculo estimativo de los tamaos de las porciones de algunos alimentos. Dedique tiempo a poner porciones de diferentes alimentos en sus platos, tazones y tazas predilectos, a fin de saber cmo se ve una porcin. Intente no comer directamente de un envase de alimentos, por ejemplo, de una bolsa o una caja. Comer directamente del envase dificulta ver cunto est comiendo y puede conducir a comer en exceso. Ponga la cantidad que le gustara comer en una taza o un plato, a fin de asegurarse de que est comiendo la porcin correcta. Use platos, vasos y tazones ms pequeos para medir porciones ms pequeas y evitar no comer en exceso. Intente no realizar varias tareas al mismo tiempo. Por ejemplo, evite mirar televisin o usar la computadora mientras come. Si es la hora de comer, sintese a la mesa y disfrute de la comida. Esto lo ayudar a reconocer cundo est satisfecho. Tambin le permitir estar ms consciente de qu come y cunto come. Consejos para seguir este plan Al leer las etiquetas de los alimentos Controle el recuento de caloras en comparacin con el tamao de la porcin. El tamao de la porcin puede ser ms pequeo de lo que suele comer. Verifique la fuente de las caloras. Intente elegir alimentos ricos en protenas, fibras y vitaminas, y bajos en grasas saturadas, grasas trans y sodio. Al ir de compras Lea las etiquetas nutricionales cuando compre. Esto lo ayudar a tomar decisiones saludables sobre qu alimentos comprar. Preste atencin a las etiquetas nutricionales de alimentos bajos en grasas o sin grasas. Estos alimentos a veces tienen la misma cantidad de caloras o ms caloras que las versiones ricas en grasas. Con frecuencia, tambin tienen agregados de azcar, almidn o sal, para darles el sabor que fue eliminado con las grasas. Haga una lista de compras con los alimentos que tienen un menor contenido de caloras y resptela. Al cocinar Intente cocinar sus alimentos preferidos  de una manera ms saludable. Por ejemplo, pruebe hornear en vez de frer. Utilice productos lcteos descremados. Planificacin de las comidas Utilice ms frutas y verduras. La mitad de su plato debe ser de frutas y verduras. Incluya protenas magras, como pollo, pavo y pescado. Estilo de vida Cada semana, trate de hacer una de las siguientes cosas: 150 minutos de ejercicio moderado, como caminar. 75 minutos de ejercicio enrgico, como correr. Informacin general Sepa cuntas caloras tienen los alimentos que come con ms frecuencia. Esto le ayudar a contar las caloras ms rpidamente. Encuentre un mtodo para controlar las caloras que funcione para usted. Sea creativo. Pruebe aplicaciones o programas distintos, si llevar un registro de las caloras no funciona para usted. Qu alimentos debo consumir?  Consuma alimentos nutritivos. Es mejor comer un alimento nutritivo, de alto contenido calrico, como un aguacate, que uno con pocos nutrientes, como una bolsa de patatas fritas. Use sus caloras en alimentos y bebidas que lo sacien y no lo dejen con apetito apenas termina de comer. Ejemplos de alimentos que lo sacian son los frutos secos y mantequillas de frutos secos, verduras, protenas magras y alimentos con alto contenido de fibra como los cereales integrales. Los alimentos con alto   contenido de fibra son aquellos que tienen ms de 5 g de fibra por porcin. Preste atencin a las caloras en las bebidas. Las bebidas de bajas caloras incluyen agua y refrescos sin azcar. Es posible que los productos que se enumeran ms arriba no constituyan una lista completa de los alimentos y las bebidas que puede tomar. Consulte a un nutricionista para obtener ms informacin. Qu alimentos debo limitar? Limite el consumo de alimentos o bebidas que no sean buenas fuentes de vitaminas, minerales o protenas, o que tengan alto contenido de grasas no saludables. Estos incluyen: Caramelos. Otros  dulces. Refrescos, bebidas con caf especiales, alcohol y jugo. Es posible que los productos que se enumeran ms arriba no constituyan una lista completa de los alimentos y las bebidas que debe evitar. Consulte a un nutricionista para obtener ms informacin. Cmo puedo hacer el recuento de caloras cuando como afuera? Preste atencin a las porciones. A menudo, las porciones son mucho ms grandes al comer afuera. Pruebe con estos consejos para mantener las porciones ms pequeas: Considere la posibilidad de compartir una comida en lugar de tomarla toda usted solo. Si pide su propia comida, coma solo la mitad. Antes de empezar a comer, pida un recipiente y ponga la mitad de la comida en l. Cuando sea posible, considere la posibilidad de pedir porciones ms pequeas del men en lugar de porciones completas. Preste atencin a la eleccin de alimentos y bebidas. Saber la forma en que se cocinan los alimentos y lo que incluye la comida puede ayudarlo a ingerir menos caloras. Si se detallan las caloras en el men, elija las opciones que contengan la menor cantidad. Elija platos que incluyan verduras, frutas, cereales integrales, productos lcteos con bajo contenido de grasa y protenas magras. Opte por los alimentos hervidos, asados, cocidos a la parrilla o al vapor. Evite los alimentos a los que se les ponga mantequilla, que estn empanados o fritos, o que se sirvan con salsa a base de crema. Generalmente, los alimentos que se etiquetan como "crujientes" estn fritos, a menos que se indique lo contrario. Elija el agua, la leche descremada, el t helado sin azcar u otras bebidas que no contengan azcares agregados. Si desea una bebida alcohlica, escoja una opcin con menos caloras, como una copa de vino o una cerveza ligera. Ordene los aderezos, las salsas y los jarabes aparte. Estos son, con frecuencia, de alto contenido en caloras, por lo que debe limitar la cantidad que ingiere. Si desea una  ensalada, elija una de hortalizas y pida carnes a la parrilla. Evite las guarniciones adicionales como el tocino, el queso o los alimentos fritos. Ordene el aderezo aparte o pida aceite de oliva y vinagre o limn para aderezar. Haga un clculo estimativo de la cantidad de porciones que le sirven. Conocer el tamao de las porciones lo ayudar a estar atento a la cantidad de comida que come en los restaurantes. Dnde buscar ms informacin Centers for Disease Control and Prevention (Centros para el Control y la Prevencin de Enfermedades): www.cdc.gov U.[place]. Department of Agriculture (Departamento de Agricultura de los [place]): myplate.gov Resumen El recuento de caloras es el registro de la cantidad de caloras que se comen y beben cada da. Si come menos caloras de las que el cuerpo necesita, debera bajar de peso. Una cantidad de peso saludable para bajar por semana suele ser entre 1 y 2 libras (0.5 a 0.9 kg). Esto significa, con frecuencia, reducir su ingesta diaria de caloras unas 500 a 750 caloras. Es posible   encontrar la cantidad de caloras que contiene un alimento en la etiqueta de informacin nutricional. Si un alimento no tiene una etiqueta de informacin nutricional, intente buscar las caloras en Internet o pida ayuda al nutricionista. Use platos, vasos y tazones ms pequeos para medir porciones ms pequeas y evitar no comer en exceso. Use sus caloras en alimentos y bebidas que lo sacien y no lo dejen con apetito poco tiempo despus de haber comido. Esta informacin no tiene como fin reemplazar el consejo del mdico. Asegrese de hacerle al mdico cualquier pregunta que tenga. Document Revised: 05/04/2019 Document Reviewed: 05/04/2019 Elsevier Patient Education  2023 Elsevier Inc.  

## 2022-02-06 LAB — COMPREHENSIVE METABOLIC PANEL
ALT: 30 IU/L (ref 0–32)
AST: 12 IU/L (ref 0–40)
Albumin/Globulin Ratio: 1.5 (ref 1.2–2.2)
Albumin: 4.6 g/dL (ref 3.9–4.9)
Alkaline Phosphatase: 77 IU/L (ref 44–121)
BUN/Creatinine Ratio: 18 (ref 9–23)
BUN: 18 mg/dL (ref 6–24)
Bilirubin Total: 0.9 mg/dL (ref 0.0–1.2)
CO2: 18 mmol/L — ABNORMAL LOW (ref 20–29)
Calcium: 9.7 mg/dL (ref 8.7–10.2)
Chloride: 108 mmol/L — ABNORMAL HIGH (ref 96–106)
Creatinine, Ser: 0.99 mg/dL (ref 0.57–1.00)
Globulin, Total: 3 g/dL (ref 1.5–4.5)
Glucose: 156 mg/dL — ABNORMAL HIGH (ref 70–99)
Potassium: 4.5 mmol/L (ref 3.5–5.2)
Sodium: 142 mmol/L (ref 134–144)
Total Protein: 7.6 g/dL (ref 6.0–8.5)
eGFR: 74 mL/min/{1.73_m2} (ref >59)

## 2022-02-06 LAB — CBC WITH DIFFERENTIAL/PLATELET
Basophils Absolute: 0.1 10*3/uL (ref 0.0–0.2)
Basos: 1 %
EOS (ABSOLUTE): 0.3 10*3/uL (ref 0.0–0.4)
Eos: 3 %
Hematocrit: 42.5 % (ref 34.0–46.6)
Hemoglobin: 14.1 g/dL (ref 11.1–15.9)
Immature Grans (Abs): 0 10*3/uL (ref 0.0–0.1)
Immature Granulocytes: 0 %
Lymphocytes Absolute: 2.9 10*3/uL (ref 0.7–3.1)
Lymphs: 32 %
MCH: 30.2 pg (ref 26.6–33.0)
MCHC: 33.2 g/dL (ref 31.5–35.7)
MCV: 91 fL (ref 79–97)
Monocytes Absolute: 0.5 10*3/uL (ref 0.1–0.9)
Monocytes: 5 %
Neutrophils Absolute: 5.2 10*3/uL (ref 1.4–7.0)
Neutrophils: 59 %
Platelets: 284 10*3/uL (ref 150–450)
RBC: 4.67 x10E6/uL (ref 3.77–5.28)
RDW: 12.3 % (ref 11.7–15.4)
WBC: 8.9 10*3/uL (ref 3.4–10.8)

## 2022-02-06 LAB — TSH: TSH: 11.1 u[IU]/mL — ABNORMAL HIGH (ref 0.450–4.500)

## 2022-02-06 LAB — LIPID PANEL
Chol/HDL Ratio: 4.6 ratio — ABNORMAL HIGH (ref 0.0–4.4)
Cholesterol, Total: 160 mg/dL (ref 100–199)
HDL: 35 mg/dL — ABNORMAL LOW (ref >39)
LDL Chol Calc (NIH): 100 mg/dL — ABNORMAL HIGH (ref 0–99)
Triglycerides: 143 mg/dL (ref 0–149)
VLDL Cholesterol Cal: 25 mg/dL (ref 5–40)

## 2022-02-06 LAB — T4, FREE: Free T4: 1.2 ng/dL (ref 0.82–1.77)

## 2022-02-06 LAB — VITAMIN D 25 HYDROXY (VIT D DEFICIENCY, FRACTURES): Vit D, 25-Hydroxy: 25.2 ng/mL — ABNORMAL LOW (ref 30.0–100.0)

## 2022-02-06 LAB — MICROALBUMIN / CREATININE URINE RATIO
Creatinine, Urine: 106.6 mg/dL
Microalb/Creat Ratio: 666 mg/g creat — ABNORMAL HIGH (ref 0–29)
Microalbumin, Urine: 709.8 ug/mL

## 2022-02-15 ENCOUNTER — Other Ambulatory Visit (INDEPENDENT_AMBULATORY_CARE_PROVIDER_SITE_OTHER): Payer: Self-pay | Admitting: Primary Care

## 2022-02-15 DIAGNOSIS — E039 Hypothyroidism, unspecified: Secondary | ICD-10-CM

## 2022-02-15 MED ORDER — LEVOTHYROXINE SODIUM 88 MCG PO TABS
88.0000 ug | ORAL_TABLET | Freq: Every day | ORAL | 1 refills | Status: DC
  Filled 2022-02-15: qty 90, 90d supply, fill #0
  Filled 2022-04-29: qty 90, 90d supply, fill #1
  Filled 2022-05-29: qty 30, 30d supply, fill #1

## 2022-02-16 ENCOUNTER — Other Ambulatory Visit: Payer: Self-pay

## 2022-03-02 ENCOUNTER — Other Ambulatory Visit: Payer: Self-pay

## 2022-03-05 ENCOUNTER — Other Ambulatory Visit: Payer: Self-pay

## 2022-04-04 ENCOUNTER — Other Ambulatory Visit: Payer: Self-pay | Admitting: Family Medicine

## 2022-04-05 ENCOUNTER — Other Ambulatory Visit: Payer: Self-pay

## 2022-04-05 MED ORDER — TRULICITY 1.5 MG/0.5ML ~~LOC~~ SOAJ
1.5000 mg | SUBCUTANEOUS | 3 refills | Status: DC
  Filled 2022-04-05: qty 2, 28d supply, fill #0
  Filled 2022-04-29 – 2022-04-30 (×2): qty 2, 28d supply, fill #1
  Filled 2022-05-29 – 2022-05-30 (×2): qty 2, 28d supply, fill #2
  Filled 2022-06-29: qty 2, 28d supply, fill #3

## 2022-04-06 ENCOUNTER — Other Ambulatory Visit: Payer: Self-pay

## 2022-04-29 ENCOUNTER — Other Ambulatory Visit: Payer: Self-pay | Admitting: Family Medicine

## 2022-04-29 DIAGNOSIS — E1142 Type 2 diabetes mellitus with diabetic polyneuropathy: Secondary | ICD-10-CM

## 2022-04-30 ENCOUNTER — Other Ambulatory Visit: Payer: Self-pay

## 2022-04-30 MED ORDER — INSULIN GLARGINE 100 UNIT/ML SOLOSTAR PEN
15.0000 [IU] | PEN_INJECTOR | Freq: Every day | SUBCUTANEOUS | 0 refills | Status: DC
  Filled 2022-04-30 – 2022-05-29 (×2): qty 3, 20d supply, fill #0

## 2022-05-03 ENCOUNTER — Other Ambulatory Visit: Payer: Self-pay

## 2022-05-08 ENCOUNTER — Ambulatory Visit (INDEPENDENT_AMBULATORY_CARE_PROVIDER_SITE_OTHER): Payer: Self-pay | Admitting: Primary Care

## 2022-05-08 ENCOUNTER — Telehealth (INDEPENDENT_AMBULATORY_CARE_PROVIDER_SITE_OTHER): Payer: Self-pay

## 2022-05-08 NOTE — Telephone Encounter (Signed)
Contacted pt because she missed her appt with the provider this morning and was seeing if she would like to switch to virtual or reschedule pt didn't answer and was unable to lvm due to call can't be completed

## 2022-05-30 ENCOUNTER — Telehealth: Payer: Self-pay | Admitting: Pharmacist

## 2022-05-30 ENCOUNTER — Other Ambulatory Visit: Payer: Self-pay

## 2022-05-30 NOTE — Telephone Encounter (Signed)
Received request to change manufacturers of levothyroxine d/t her current supplier being out. I authorized the change. Her thyroid levels revealed improvement in Jan and she has an appt upcoming later this month. I instructed the pharmacy to only dispense a 30-day supply and we will plan to recheck levels at that visit.

## 2022-06-01 ENCOUNTER — Other Ambulatory Visit: Payer: Self-pay

## 2022-06-07 ENCOUNTER — Ambulatory Visit (INDEPENDENT_AMBULATORY_CARE_PROVIDER_SITE_OTHER): Payer: Self-pay | Admitting: Primary Care

## 2022-06-13 LAB — LAB REPORT - SCANNED
Albumin, Urine POC: 4.2
Creatinine, POC: 0.98 mg/dL
EGFR: 75

## 2022-06-19 ENCOUNTER — Ambulatory Visit (INDEPENDENT_AMBULATORY_CARE_PROVIDER_SITE_OTHER): Payer: Self-pay | Admitting: Primary Care

## 2022-06-19 ENCOUNTER — Other Ambulatory Visit: Payer: Self-pay

## 2022-06-19 ENCOUNTER — Encounter (INDEPENDENT_AMBULATORY_CARE_PROVIDER_SITE_OTHER): Payer: Self-pay | Admitting: Primary Care

## 2022-06-19 VITALS — BP 130/86 | HR 78 | Resp 16 | Wt 167.0 lb

## 2022-06-19 DIAGNOSIS — I10 Essential (primary) hypertension: Secondary | ICD-10-CM

## 2022-06-19 DIAGNOSIS — Z1159 Encounter for screening for other viral diseases: Secondary | ICD-10-CM

## 2022-06-19 DIAGNOSIS — E039 Hypothyroidism, unspecified: Secondary | ICD-10-CM

## 2022-06-19 DIAGNOSIS — Z794 Long term (current) use of insulin: Secondary | ICD-10-CM

## 2022-06-19 DIAGNOSIS — E1142 Type 2 diabetes mellitus with diabetic polyneuropathy: Secondary | ICD-10-CM

## 2022-06-19 LAB — POCT GLYCOSYLATED HEMOGLOBIN (HGB A1C): HbA1c, POC (controlled diabetic range): 7.3 % — AB (ref 0.0–7.0)

## 2022-06-19 MED ORDER — EMPAGLIFLOZIN 10 MG PO TABS
10.0000 mg | ORAL_TABLET | Freq: Every day | ORAL | 1 refills | Status: DC
  Filled 2022-06-19: qty 90, 90d supply, fill #0
  Filled 2022-06-19: qty 30, 30d supply, fill #0

## 2022-06-19 NOTE — Progress Notes (Signed)
[facility]  Audrey Mcmillan, is a 42 y.o. female  ZOX:[id number]  WJX:[id number]  DOB - 1980-06-11  Chief Complaint  Patient presents with   Diabetes   Hypertension       Subjective:   Audrey Mcmillan is a 43 y.o. female here today for a follow up visit. Patient has No headache, No chest pain, No abdominal pain - No Nausea, No new weakness tingling or numbness, No Cough - shortness of breath  No problems updated.  No Known Allergies  Past Medical History:  Diagnosis Date   Diabetes mellitus without complication (HCC)    type 2   High cholesterol    Hyperthyroidism    Thyroid cancer ([facility])     Current Outpatient Medications on File Prior to Visit  Medication Sig Dispense Refill   atorvastatin (LIPITOR) 80 MG tablet Take 1 tablet (80 mg total) by mouth daily. 90 tablet 3   Blood Glucose Monitoring Suppl (TRUE METRIX METER) w/Device KIT Use as instructed 1 kit 0   Dulaglutide (TRULICITY) 1.5 MG/0.5ML SOPN Inject 1.5 mg into the skin once a week. 2 mL 3   ergocalciferol (VITAMIN D2) 1.25 MG (50000 UT) capsule Take 1 capsule (50,000 Units total) by mouth once a week. 4 capsule 1   fluconazole (DIFLUCAN) 150 MG tablet Take 1 tablet (150 mg total) by mouth every 3 (three) days. (Patient not taking: Reported on 06/19/2022) 4 tablet 0   glucose blood (TRUE METRIX BLOOD GLUCOSE TEST) test strip Use as instructed. Check blood glucose levels twice per day. 100 each 12   insulin glargine (LANTUS) 100 UNIT/ML Solostar Pen Inject 15 Units into the skin daily. 3 mL 0   Insulin Pen Needle (TECHLITE PEN NEEDLES) 32G X 4 MM MISC Use to inject insulin up to 4 times daily as directed. 100 each 3   Insulin Syringe-Needle U-100 30G X 5/16" 1 ML MISC Inject 1 application into the skin 2 times daily at 12 noon and 4 pm. 100 each 5   levothyroxine (SYNTHROID) 88 MCG tablet Take 1 tablet (88 mcg total) by mouth daily. 60 tablet 1   NIFEdipine (PROCARDIA-XL/NIFEDICAL-XL)  30 MG 24 hr tablet Take 1 tablet (30 mg total) by mouth daily. 30 tablet 11   TRUEplus Lancets 28G MISC Use as instructed. Check blood glucose levels twice per day. 100 each 3   No current facility-administered medications on file prior to visit.    Objective:   Vitals:   06/19/22 1101 06/19/22 1102  BP: 135/86 130/86  Pulse: 78   Resp: 16   SpO2: 99%   Weight: 167 lb (75.8 kg)     Comprehensive ROS Pertinent positive and negative noted in HPI   Exam General appearance : Awake, alert, not in any distress. Speech Clear. Not toxic looking HEENT: Atraumatic and Normocephalic, pupils equally reactive to light and accomodation Neck: Supple, no JVD. No cervical lymphadenopathy.  Chest: Good air entry bilaterally, no added sounds  CVS: S1 S2 regular, no murmurs.  Abdomen: Bowel sounds present, Non tender and not distended with no gaurding, rigidity or rebound. Extremities: B/L Lower Ext shows no edema, both legs are warm to touch Neurology: Awake alert, and oriented X 3, CN II-XII intact, Non focal Skin: No Rash  Data Review Lab Results  Component Value Date   HGBA1C 7.3 (A) 06/19/2022   HGBA1C 7.5 (A) 02/05/2022   HGBA1C 11.2 (A) 09/22/2021    Assessment & Plan  Yireh was seen today for diabetes  and hypertension.  Diagnoses and all orders for this visit:  Type 2 diabetes mellitus with diabetic polyneuropathy, with long-term current use of insulin (HCC) - educated on lifestyle modifications, including but not limited to diet choices and adding exercise to daily routine.   -     POCT glycosylated hemoglobin (Hb A1C) -     empagliflozin (JARDIANCE) 10 MG TABS tablet; Take 1 tablet (10 mg total) by mouth daily before breakfast. -     Lipid Panel  Encounter for HCV screening test for low risk patient -     HCV Ab w Reflex to Quant PCR  Acquired hypothyroidism -     TSH + free T4  Essential hypertension Controlled  BP goal - < 130/80 Explained that having normal blood  pressure is the goal and medications are helping to get to goal and maintain normal blood pressure. DIET: Limit salt intake, read nutrition labels to check salt content, limit fried and high fatty foods  Avoid using multisymptom OTC cold preparations that generally contain sudafed which can rise BP. Consult with pharmacist on best cold relief products to use for persons with HTN EXERCISE Discussed incorporating exercise such as walking - 30 minutes most days of the week and can do in 10 minute intervals    -     CMP14+EGFR  Patient have been counseled extensively about nutrition and exercise. Other issues discussed during this visit include: low cholesterol diet, weight control and daily exercise, foot care, annual eye examinations at Ophthalmology, importance of adherence with medications and regular follow-up. We also discussed long term complications of uncontrolled diabetes and hypertension.   Return in about 3 months (around 09/19/2022) for medical conditions.  The patient was given clear instructions to go to ER or return to medical center if symptoms don't improve, worsen or new problems develop. The patient verbalized understanding. The patient was told to call to get lab results if they haven't heard anything in the next week.   This note has been created with Education officer, environmental. Any transcriptional errors are unintentional.   Grayce Sessions, NP 06/19/2022, 1:55 PM

## 2022-06-20 LAB — CMP14+EGFR
ALT: 18 IU/L (ref 0–32)
AST: 10 IU/L (ref 0–40)
Albumin/Globulin Ratio: 1.6 (ref 1.2–2.2)
Albumin: 4.4 g/dL (ref 3.9–4.9)
Alkaline Phosphatase: 89 IU/L (ref 44–121)
BUN/Creatinine Ratio: 28 — ABNORMAL HIGH (ref 9–23)
BUN: 25 mg/dL — ABNORMAL HIGH (ref 6–24)
Bilirubin Total: 0.6 mg/dL (ref 0.0–1.2)
CO2: 18 mmol/L — ABNORMAL LOW (ref 20–29)
Calcium: 9.7 mg/dL (ref 8.7–10.2)
Chloride: 105 mmol/L (ref 96–106)
Creatinine, Ser: 0.88 mg/dL (ref 0.57–1.00)
Globulin, Total: 2.8 g/dL (ref 1.5–4.5)
Glucose: 129 mg/dL — ABNORMAL HIGH (ref 70–99)
Potassium: 4.8 mmol/L (ref 3.5–5.2)
Sodium: 139 mmol/L (ref 134–144)
Total Protein: 7.2 g/dL (ref 6.0–8.5)
eGFR: 85 mL/min/{1.73_m2} (ref >59)

## 2022-06-20 LAB — LIPID PANEL
Chol/HDL Ratio: 3.7 ratio (ref 0.0–4.4)
Cholesterol, Total: 176 mg/dL (ref 100–199)
HDL: 47 mg/dL (ref >39)
LDL Chol Calc (NIH): 103 mg/dL — ABNORMAL HIGH (ref 0–99)
Triglycerides: 146 mg/dL (ref 0–149)
VLDL Cholesterol Cal: 26 mg/dL (ref 5–40)

## 2022-06-20 LAB — HCV INTERPRETATION

## 2022-06-20 LAB — TSH+FREE T4
Free T4: 0.94 ng/dL (ref 0.82–1.77)
TSH: 14 u[IU]/mL — ABNORMAL HIGH (ref 0.450–4.500)

## 2022-06-20 LAB — HCV AB W REFLEX TO QUANT PCR: HCV Ab: NONREACTIVE

## 2022-06-22 ENCOUNTER — Other Ambulatory Visit: Payer: Self-pay | Admitting: Family Medicine

## 2022-06-22 ENCOUNTER — Other Ambulatory Visit: Payer: Self-pay

## 2022-06-22 DIAGNOSIS — Z794 Long term (current) use of insulin: Secondary | ICD-10-CM

## 2022-06-22 MED ORDER — INSULIN GLARGINE 100 UNIT/ML SOLOSTAR PEN
15.0000 [IU] | PEN_INJECTOR | Freq: Every day | SUBCUTANEOUS | 0 refills | Status: DC
Start: 2022-06-22 — End: 2022-07-18
  Filled 2022-06-22: qty 6, 40d supply, fill #0
  Filled 2022-06-29: qty 12, 80d supply, fill #0

## 2022-06-22 NOTE — Telephone Encounter (Signed)
Requested Prescriptions  Pending Prescriptions Disp Refills   insulin glargine (LANTUS) 100 UNIT/ML Solostar Pen 15 mL 0    Sig: Inject 15 Units into the skin daily.     Endocrinology:  Diabetes - Insulins Passed - 06/22/2022  2:02 PM      Passed - HBA1C is between 0 and 7.9 and within 180 days    HbA1c, POC (controlled diabetic range)  Date Value Ref Range Status  06/19/2022 7.3 (A) 0.0 - 7.0 % Final         Passed - Valid encounter within last 6 months    Recent Outpatient Visits           3 days ago Type 2 diabetes mellitus with diabetic polyneuropathy, with long-term current use of insulin (HCC)   [place] [facility] Grayce Sessions, NP   4 months ago Type 2 diabetes mellitus with diabetic polyneuropathy, with long-term current use of insulin (HCC)   [place] [facility] Grayce Sessions, NP   6 months ago Type 2 diabetes mellitus with diabetic polyneuropathy, with long-term current use of insulin [facility])   [place] [facility] [place], [place] L, RPH-CPP   7 months ago Type 2 diabetes mellitus with diabetic polyneuropathy, with long-term current use of insulin [facility])   [place] [facility] [place], [place] L, RPH-CPP   9 months ago Type 2 diabetes mellitus with diabetic polyneuropathy, with long-term current use of insulin [facility])   [place] [facility] Grayce Sessions, NP       Future Appointments             In 2 months Randa Evens, Kinnie Scales, NP [place] [facility]

## 2022-06-24 ENCOUNTER — Other Ambulatory Visit (INDEPENDENT_AMBULATORY_CARE_PROVIDER_SITE_OTHER): Payer: Self-pay | Admitting: Primary Care

## 2022-06-24 DIAGNOSIS — E039 Hypothyroidism, unspecified: Secondary | ICD-10-CM

## 2022-06-24 MED ORDER — LEVOTHYROXINE SODIUM 100 MCG PO TABS
100.0000 ug | ORAL_TABLET | Freq: Every day | ORAL | 1 refills | Status: DC
Start: 2022-06-24 — End: 2022-07-18
  Filled 2022-06-24: qty 30, 30d supply, fill #0

## 2022-06-25 ENCOUNTER — Other Ambulatory Visit: Payer: Self-pay

## 2022-06-29 ENCOUNTER — Other Ambulatory Visit: Payer: Self-pay

## 2022-07-02 ENCOUNTER — Other Ambulatory Visit: Payer: Self-pay

## 2022-07-05 ENCOUNTER — Ambulatory Visit (INDEPENDENT_AMBULATORY_CARE_PROVIDER_SITE_OTHER): Payer: Self-pay | Admitting: *Deleted

## 2022-07-05 DIAGNOSIS — Z3201 Encounter for pregnancy test, result positive: Secondary | ICD-10-CM

## 2022-07-05 DIAGNOSIS — Z3200 Encounter for pregnancy test, result unknown: Secondary | ICD-10-CM

## 2022-07-05 DIAGNOSIS — Z3490 Encounter for supervision of normal pregnancy, unspecified, unspecified trimester: Secondary | ICD-10-CM

## 2022-07-05 LAB — POCT PREGNANCY, URINE: Preg Test, Ur: POSITIVE — AB

## 2022-07-05 NOTE — Progress Notes (Signed)
Pt submitted urine for pregnancy test which is positive. I called pt to provide test results and she did not answer. A voicemail message was left stating that we will continue to attempt to reach her.   1645  Called pt and informed of test result. She has mixed feelings of surprise, happiness and nervous about the pregnancy. She reports having irregular periods and LMP on 03/27/22 which only lasted one Dois [place]arbe. I advised that she will need ultrasound to confirm dating. Appointment scheduled on 6/17@ 10:15 am. Pt instructed to have a full bladder for the exam. She will also be notified of New Ob appointments once scheduled.  Pt states she has diabetes and is unsure if she should continue taking insulin. I advised her to continue taking all medications and that she needs to notify her PCP tomorrow of the pregnancy and seek recommendation for any medication changes. Pt voiced understanding of all information and instructions given.

## 2022-07-09 ENCOUNTER — Telehealth: Payer: Self-pay | Admitting: Family Medicine

## 2022-07-09 ENCOUNTER — Other Ambulatory Visit: Payer: Self-pay

## 2022-07-09 NOTE — Telephone Encounter (Signed)
Left patient a Detailed message about appointment change for Today due to staffing, it has been rescheduled to 6-19

## 2022-07-10 ENCOUNTER — Telehealth: Payer: Self-pay | Admitting: Family Medicine

## 2022-07-10 NOTE — Telephone Encounter (Signed)
Spoke with patient about scheduling appointment to establish her prenatal care. She denied an opportunity to have her intake visit because she had another appointment. She was scheduled to see the MD to start care.

## 2022-07-10 NOTE — Telephone Encounter (Signed)
-----   Message from Drucilla Schmidt Day, RN sent at 07/05/2022  5:24 PM EDT ----- Regarding: Needs New Ob appts Pt needs New Ob intake and New Ob provider appts.  She is approx 14 wks now - has [place] to confirm on 6/17.  She is High risk and needs appts sooner rather than later. Please schedule w/MD only and call pt to notify - she does not use Mychart. She prefers morning appt and she speaks [place].

## 2022-07-11 ENCOUNTER — Ambulatory Visit (INDEPENDENT_AMBULATORY_CARE_PROVIDER_SITE_OTHER): Payer: Self-pay

## 2022-07-11 ENCOUNTER — Other Ambulatory Visit: Payer: Self-pay

## 2022-07-11 DIAGNOSIS — Z3A01 Less than 8 weeks gestation of pregnancy: Secondary | ICD-10-CM

## 2022-07-11 DIAGNOSIS — Z3491 Encounter for supervision of normal pregnancy, unspecified, first trimester: Secondary | ICD-10-CM

## 2022-07-11 DIAGNOSIS — Z3490 Encounter for supervision of normal pregnancy, unspecified, unspecified trimester: Secondary | ICD-10-CM

## 2022-07-16 ENCOUNTER — Encounter (INDEPENDENT_AMBULATORY_CARE_PROVIDER_SITE_OTHER): Payer: Self-pay

## 2022-07-16 ENCOUNTER — Ambulatory Visit (INDEPENDENT_AMBULATORY_CARE_PROVIDER_SITE_OTHER): Payer: Self-pay | Admitting: Primary Care

## 2022-07-18 ENCOUNTER — Other Ambulatory Visit: Payer: Self-pay

## 2022-07-18 ENCOUNTER — Encounter: Payer: Self-pay | Admitting: Obstetrics and Gynecology

## 2022-07-18 ENCOUNTER — Ambulatory Visit (INDEPENDENT_AMBULATORY_CARE_PROVIDER_SITE_OTHER): Payer: Self-pay | Admitting: Obstetrics and Gynecology

## 2022-07-18 VITALS — BP 131/78 | HR 81 | Wt 169.2 lb

## 2022-07-18 DIAGNOSIS — O359XX0 Maternal care for (suspected) fetal abnormality and damage, unspecified, not applicable or unspecified: Secondary | ICD-10-CM

## 2022-07-18 DIAGNOSIS — E039 Hypothyroidism, unspecified: Secondary | ICD-10-CM

## 2022-07-18 DIAGNOSIS — Z8759 Personal history of other complications of pregnancy, childbirth and the puerperium: Secondary | ICD-10-CM

## 2022-07-18 DIAGNOSIS — O0990 Supervision of high risk pregnancy, unspecified, unspecified trimester: Secondary | ICD-10-CM

## 2022-07-18 DIAGNOSIS — Z3A01 Less than 8 weeks gestation of pregnancy: Secondary | ICD-10-CM

## 2022-07-18 DIAGNOSIS — O10911 Unspecified pre-existing hypertension complicating pregnancy, first trimester: Secondary | ICD-10-CM

## 2022-07-18 DIAGNOSIS — O2341 Unspecified infection of urinary tract in pregnancy, first trimester: Secondary | ICD-10-CM

## 2022-07-18 DIAGNOSIS — O24319 Unspecified pre-existing diabetes mellitus in pregnancy, unspecified trimester: Secondary | ICD-10-CM

## 2022-07-18 DIAGNOSIS — O24311 Unspecified pre-existing diabetes mellitus in pregnancy, first trimester: Secondary | ICD-10-CM

## 2022-07-18 DIAGNOSIS — O1211 Gestational proteinuria, first trimester: Secondary | ICD-10-CM

## 2022-07-18 DIAGNOSIS — O09521 Supervision of elderly multigravida, first trimester: Secondary | ICD-10-CM

## 2022-07-18 DIAGNOSIS — O10919 Unspecified pre-existing hypertension complicating pregnancy, unspecified trimester: Secondary | ICD-10-CM

## 2022-07-18 DIAGNOSIS — E1142 Type 2 diabetes mellitus with diabetic polyneuropathy: Secondary | ICD-10-CM

## 2022-07-18 DIAGNOSIS — Z794 Long term (current) use of insulin: Secondary | ICD-10-CM

## 2022-07-18 DIAGNOSIS — O0991 Supervision of high risk pregnancy, unspecified, first trimester: Secondary | ICD-10-CM

## 2022-07-18 DIAGNOSIS — O09523 Supervision of elderly multigravida, third trimester: Secondary | ICD-10-CM

## 2022-07-18 MED ORDER — INSULIN GLARGINE 100 UNIT/ML SOLOSTAR PEN
10.0000 [IU] | PEN_INJECTOR | Freq: Every day | SUBCUTANEOUS | 0 refills | Status: DC
Start: 2022-07-18 — End: 2022-09-26

## 2022-07-18 MED ORDER — PREPLUS 27-1 MG PO TABS
1.0000 | ORAL_TABLET | Freq: Every day | ORAL | 2 refills | Status: DC
  Filled 2022-07-18: qty 30, 30d supply, fill #0
  Filled 2022-08-30 – 2022-09-21 (×2): qty 30, 30d supply, fill #1
  Filled 2022-10-21: qty 30, 30d supply, fill #2
  Filled 2022-11-25 – 2022-12-07 (×2): qty 30, 30d supply, fill #3
  Filled 2023-01-02: qty 30, 30d supply, fill #4

## 2022-07-18 MED ORDER — LEVOTHYROXINE SODIUM 112 MCG PO TABS
112.0000 ug | ORAL_TABLET | Freq: Every day | ORAL | 1 refills | Status: DC
  Filled 2022-07-18: qty 42, 42d supply, fill #0

## 2022-07-18 NOTE — Progress Notes (Signed)
Patient here for initial prenatal visit. She has a hx of irregular periods. Per dating ultrasound, Audrey Mcmillan is currently [redacted] weeks pregnant.     Per provider, initial prenatal labs will be not be drawn today

## 2022-07-18 NOTE — Addendum Note (Signed)
Addended by: Guy Begin on: 07/18/2022 05:12 PM   Modules accepted: Orders

## 2022-07-18 NOTE — Progress Notes (Signed)
New OB Note  07/18/2022   Clinic: [facility]  Chief Complaint: new OB  Transfer of Care Patient: no  History of Present Illness: [name] is a 42 y.o. 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   No SAB s/s  Patient found out she was pregnant [redacted] weeks ago; periods irregular  ROS: A 12-point review of systems was performed and negative, except as stated in the above HPI.  OBGYN History: As per HPI. OB History  Gravida Para Term Preterm AB Living  3 2 1 1  0 2  SAB IAB Ectopic Multiple Live Births  0 0 0 0 2    # Outcome Date GA Lbr Len/2nd Weight Sex Delivery Anes PTL Lv  3 Current           2 Preterm 12/07/20 [redacted]w[redacted]d 01:50 / 00:02 4 lb 4 oz (1.928 kg) M Vag-Spont None  LIV  1 Term 08/09/97 [redacted]w[redacted]d  6 lb (2.722 kg) M Vag-Spont   LIV    History of pap smears: Yes. Last pap smear 02/2021 and results were cytology and hpv negative   Past Medical History: Past Medical History:  Diagnosis Date   Diabetes mellitus without complication (HCC)    type 2   Gestational hypertension, third trimester 11/14/2020   High cholesterol    Hyperthyroidism    Kidney stone complicating pregnancy, first trimester 06/06/2020   Marginal insertion of umbilical cord affecting management of mother 08/14/2020   Thyroid cancer [facility])     Past Surgical History: Past Surgical History:  Procedure Laterality Date   NO PAST SURGERIES      Family History:   Family History  Family history unknown: Yes    Social History:  Social History   Socioeconomic History   Marital status: Single    Spouse name: Not on file   Number of children: Not on file   Years of education: Not on file   Highest education level: 12th grade  Occupational History   Not on file  Tobacco Use   Smoking status: Never   Smokeless tobacco: Never  Vaping Use   Vaping Use: Never used  Substance and Sexual Activity   Alcohol use: No    Comment: occ   Drug use: No   Sexual activity: Yes    Birth control/protection: None  Other Topics Concern   Not on file  Social History Narrative   ** Merged History Encounter **       Social Determinants of Health   Financial Resource Strain: Medium Risk (06/18/2022)   Overall Financial Resource Strain (CARDIA)    Difficulty of Paying Living Expenses: Somewhat hard  Food Insecurity: No Food Insecurity (07/18/2022)   Hunger Vital Sign    Worried About Running Out of Food in the Last Year: Never true    Ran Out of Food in the Last Year: Never true  Recent Concern: Food Insecurity - Food Insecurity Present (06/18/2022)   Hunger Vital Sign    Worried About Running Out of Food in the Last Year: Sometimes true    Ran Out  of Food in the Last Year: Sometimes true  Transportation Needs: No Transportation Needs (07/18/2022)   PRAPARE - Administrator, Civil Service (Medical): No    Lack of Transportation (Non-Medical): No  Physical Activity: Insufficiently Active (06/18/2022)   Exercise Vital Sign    Days of Exercise per Week: 4 days    Minutes of Exercise per Session: 30 min  Stress: No Stress Concern Present (06/18/2022)   [employer] of Occupational Health - Occupational Stress Questionnaire    Feeling of Stress : Not at all  Social Connections: Unknown (06/18/2022)   Social Connection and Isolation Panel [NHANES]    Frequency of Communication with Friends and Family: Patient declined    Frequency of Social  Gatherings with Friends and Family: More than three times a week    Attends Religious Services: Patient declined    Database administrator or Organizations: No    Attends Engineer, structural: Not on file    Marital Status: Separated  Intimate Partner Violence: Not on file    Allergy: No Known Allergies  Current Outpatient Medications: Synthroid 88 Lipitor Trulicity Lantus 10u every day (sometimes in am or pm)  Physical Exam:   BP 131/78   Pulse 81   Wt 169 lb 3.2 oz (76.7 kg)   LMP  (LMP Unknown)   BMI 30.95 kg/m  Body mass index is 30.95 kg/m. Contractions: Not present Vag. Bleeding: None.   General appearance: Well nourished, well developed female in no acute distress.  Neck:  Supple, normal appearance, and no thyromegaly  Cardiovascular: S1, S2 normal, no murmur, rub or gallop, regular rate and rhythm Respiratory:  Clear to auscultation bilateral. Normal respiratory effort Abdomen: positive bowel sounds and no masses, hernias; diffusely non tender to palpation, non distended Breasts: No s/s. Neuro/Psych:  Normal mood and affect.  Skin:  Warm and dry.   Labs: PCP labs reviewed  Imaging:  As per HPI Bedside u/s with SLIUP, FHR wnl  Assessment: patient stable  Plan: 1. Supervision of high risk pregnancy, antepartum Needs new OB labs at 11wks, including offer patient panorama then. Prenatal vitamin sent in  - [place] MFM OB DETAIL +14 WK; Future - Protein / creatinine ratio, urine - Culture, OB Urine - Cervicovaginal ancillary only( [place]) - Referral to Nutrition and Diabetes Services  2. Type 2 diabetes mellitus with diabetic polyneuropathy, with long-term current use of insulin (HCC) Recommend stopping the trulicity and continuing on lantus 10 for now and to do the lantus at bedtime. She is randomly checking sugars. Sheet given and recommend am fasting and two hours after the start of breakfast, lunch and dinner.  F/u 7-10d for CBG check.  DM  education referral made.  5/28 POC A1c 7.3>>may need fetal echo at 22-26wks; follow up mfm recs - Amb Referral to Nutrition and Diabetic Education - insulin glargine (LANTUS) 100 UNIT/ML Solostar Pen; Inject 10 Units into the skin at bedtime.  Dispense: 15 mL; Refill: 0 - Referral to Nutrition and Diabetes Services  3. Chronic hypertension affecting pregnancy I told her that the procardia xl is fine for pregnancy but will keep off as her bp may be normal since she is pregnant; I told her that will have to watch closely as bp can rise over the course of pregnancy and she may need to restart it.   4. Multigravida of advanced maternal age in third trimester Offer panorama at 11wk lab draw  5. Acquired hypothyroidism Check TSH at  11wk lab draw. Patient increased to 112 qday - levothyroxine (SYNTHROID) 112 MCG tablet; Take 1 tablet (112 mcg total) by mouth daily before breakfast.  Dispense: 42 tablet; Refill: 1  6. History of severe pre-eclampsia Start low dose asa after 12 weeks  7. History of proteinuria in pregnancy PC ratio today  8. Preexisting diabetes complicating pregnancy, antepartum See above  9. 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 She stopped the procardia but continues on everything else I told her to stop the trulicity and the lipitor   Problem list reviewed and updated.  Follow up in 7-10d for CBG and BP check  >50% of 45 min visit spent on counseling and coordination of care.     Cornelia Copa MD Attending [facility])

## 2022-07-19 ENCOUNTER — Ambulatory Visit: Payer: Self-pay | Admitting: Dietician

## 2022-07-19 LAB — PROTEIN / CREATININE RATIO, URINE
Creatinine, Urine: 63.4 mg/dL
Protein, Ur: 58.5 mg/dL
Protein/Creat Ratio: 923 mg/g creat — ABNORMAL HIGH (ref 0–200)

## 2022-07-20 ENCOUNTER — Encounter: Payer: Self-pay | Admitting: Dietician

## 2022-07-20 ENCOUNTER — Other Ambulatory Visit: Payer: Self-pay

## 2022-07-20 ENCOUNTER — Encounter: Payer: Self-pay | Attending: Primary Care | Admitting: Dietician

## 2022-07-20 VITALS — Wt 169.3 lb

## 2022-07-20 DIAGNOSIS — E1142 Type 2 diabetes mellitus with diabetic polyneuropathy: Secondary | ICD-10-CM | POA: Insufficient documentation

## 2022-07-20 DIAGNOSIS — Z794 Long term (current) use of insulin: Secondary | ICD-10-CM | POA: Insufficient documentation

## 2022-07-20 NOTE — Patient Instructions (Signed)
Goal: aim to follow the "Diabetes Plate" at least 2 meals per day. (1/2 plate non-starchy vegetables, 1/4 plate protein, and 1/4 plate complex carbs.)  Aim to eat within 1-2 hours of waking up and every 3-5 hours following. Focus on smaller frequent meals and snacks.   When snacking, aim to include a complex carb (15g) and protein. (See snack sheet)

## 2022-07-20 NOTE — Progress Notes (Signed)
Patient was seen for Gestational Diabetes self-management on 07/20/22  Start time 1045 and End time 1140   Estimated due date: 03/05/22; [redacted]w[redacted]d  Clinical: Medications: insulin glargine lantus 10 units at night, levothyroxine Medical History: type 2 diabetes, cancer, HLD, HTN, kidney disease, thyroid disease Labs: OGTT, A1c 7.3% (06/19/22) Supplements: prenatal, vitamin D  Dietary and Lifestyle History:  Pt states this is her 3rd pregnancy. Her oldest child is 27 years old. Pt is present today with her youngest child.   Pt states she has been checking blood glucose 4 times a day. She states it has been around 200mg /dL fasting and around 161-$[id number]$ /dL following meals. Pt states her previous pregnancy her fasting blood glucose was 90-100mg /dL.   Pt states she recently stopped trulicty and is doing lantus once per day at night. She states she usually injects her insulin for 5 seconds. Walked pt through steps of administering insulin.   Pt states she plans to see her doctor in 2 weeks to assess her blood glucose that she is currently logging 4 times a day and see if she needs medication changes.   Physical Activity: 45 minutes daily Stress: low Sleep: 9 hours  24 hr Recall:  First Meal: yogurt and pear Snack: none Second meal: 1pm: chicken and salad OR sandwich with [place] or ham Snack: pecans or almonds Third meal: chicken soup OR noodles Snack: none Beverages: 5-6 bottles of water, glass of milk   NUTRITION INTERVENTION  Nutrition education (E-1) on the following topics:   Initial Follow-up  [x]  []  Definition of Gestational Diabetes [x]  []  Why dietary management is important in controlling blood glucose [x]  []  Effects each nutrient has on blood glucose levels [x]  []  Simple carbohydrates vs complex carbohydrates [x]  []  Fluid intake [x]  []  Creating a balanced meal plan [x]  []  Carbohydrate counting  [x]  []  When to check blood glucose levels [x]  []  Proper blood glucose monitoring  techniques [x]  []  Effect of stress and stress reduction techniques  [x]  []  Exercise effect on blood glucose levels, appropriate exercise during pregnancy [x]  []  Importance of limiting caffeine and abstaining from alcohol and smoking [x]  []  Medications used for blood sugar control during pregnancy [x]  []  Hypoglycemia and rule of 15 [x]  []  Postpartum self care  Blood glucose monitor given: Accu-check Guide Me Lot # [id number] Exp: 08-10-23  Patient had a meter prior to visit but states it just broke yesterday. Patient was testing pre breakfast and 2 hours after each meal. Pt to start testing again today. FBS: 200+ Postprandial: 250 mg/dL  Patient instructed to monitor glucose levels: FBS: 60 - ? 95 mg/dL; 2 hour: ? 235 mg/dL  Patient received handouts: Nutrition Diabetes and Pregnancy Carbohydrate Counting List Plate Method 57D Carbohydrate + Protein Snack Ideas  Goals established by patient: Goal: aim to follow the "Diabetes Plate" at least 2 meals per day. (1/2 plate non-starchy vegetables, 1/4 plate protein, and 1/4 plate complex carbs.)  Aim to eat within 1-2 hours of waking up and every 3-5 hours following. Focus on smaller frequent meals and snacks.   When snacking, aim to include a complex carb (15g) and protein. (See snack sheet)  Follow-up: Patient will be seen for follow-up as needed. (4-6 weeks)

## 2022-07-23 LAB — URINE CULTURE, OB REFLEX

## 2022-07-23 LAB — CULTURE, OB URINE

## 2022-07-25 ENCOUNTER — Other Ambulatory Visit: Payer: Self-pay

## 2022-07-25 ENCOUNTER — Telehealth: Payer: Self-pay | Admitting: *Deleted

## 2022-07-25 DIAGNOSIS — O2341 Unspecified infection of urinary tract in pregnancy, first trimester: Secondary | ICD-10-CM | POA: Insufficient documentation

## 2022-07-25 MED ORDER — CEFADROXIL 500 MG PO CAPS
500.0000 mg | ORAL_CAPSULE | Freq: Two times a day (BID) | ORAL | 0 refills | Status: DC
Start: 2022-07-25 — End: 2022-08-28
  Filled 2022-07-25: qty 14, 7d supply, fill #0

## 2022-07-25 NOTE — Addendum Note (Signed)
Addended by: [place]on Bing on: 07/25/2022 11:11 AM   Modules accepted: Orders

## 2022-07-25 NOTE — Telephone Encounter (Signed)
I called patient  and reached her voicemail. I left message that I was calling about results and for her to call us back. Nancy Fetter

## 2022-07-25 NOTE — Telephone Encounter (Signed)
-----   Message from [place] Bing, MD sent at 07/25/2022 11:10 AM EDT ----- Can you let her know I sent in medications for her UTI? thanks

## 2022-07-27 NOTE — Telephone Encounter (Signed)
Called patient for second attempt; VM left stating I am calling with results and callback number given. MyChart message sent.

## 2022-07-30 ENCOUNTER — Other Ambulatory Visit: Payer: Self-pay

## 2022-07-30 ENCOUNTER — Ambulatory Visit (INDEPENDENT_AMBULATORY_CARE_PROVIDER_SITE_OTHER): Payer: Self-pay | Admitting: Obstetrics and Gynecology

## 2022-07-30 VITALS — BP 128/69 | HR 72 | Wt 169.3 lb

## 2022-07-30 DIAGNOSIS — O0990 Supervision of high risk pregnancy, unspecified, unspecified trimester: Secondary | ICD-10-CM

## 2022-07-30 DIAGNOSIS — O24319 Unspecified pre-existing diabetes mellitus in pregnancy, unspecified trimester: Secondary | ICD-10-CM

## 2022-07-30 MED ORDER — INSULIN LISPRO (1 UNIT DIAL) 100 UNIT/ML (KWIKPEN)
12.0000 [IU] | PEN_INJECTOR | Freq: Three times a day (TID) | SUBCUTANEOUS | 11 refills | Status: DC
  Filled 2022-07-30 (×2): qty 15, 42d supply, fill #0
  Filled 2022-09-20: qty 15, 42d supply, fill #1
  Filled 2022-09-21: qty 12, 34d supply, fill #1

## 2022-07-30 MED ORDER — INSULIN PEN NEEDLE 32G X 4 MM MISC
11 refills | Status: AC
  Filled 2022-07-30: qty 100, 33d supply, fill #0

## 2022-07-30 NOTE — Progress Notes (Signed)
   PRENATAL VISIT NOTE  Subjective:  Audrey Mcmillan is a 42 y.o. [phone] at [redacted]w[redacted]d being seen today for ongoing prenatal care.  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  Patient reports no complaints.  Contractions: Not present. Vag. Bleeding: None.  Movement: Absent. Denies leaking of fluid.   The following portions of the patient's history were reviewed and updated as appropriate: allergies, current medications, past family history, past medical history, past social history, past surgical history and problem list.   Objective:   Vitals:   07/30/22 1009  BP: 128/69  Pulse: 72  Weight: 169 lb 4.8 oz (76.8 kg)    Fetal Status: Fetal Heart Rate (bpm): 160s   Movement: Absent     General:  Alert, oriented and cooperative. Patient is in no acute distress.  Skin: Skin is warm and dry. No rash noted.   Cardiovascular: Normal heart rate noted  Respiratory: Normal respiratory effort, no problems with respiration noted  Abdomen: Soft, gravid, appropriate for gestational age.  Pain/Pressure: Absent     Pelvic: Cervical exam deferred        Extremities: Normal range of motion.  Edema: None  Mental Status: Normal mood and affect. Normal behavior. Normal judgment and thought content.  Bedside u/s with SLIUP, FHR 160s  Assessment and Plan:  Pregnancy: G3P1102 at [redacted]w[redacted]d 1. Supervision of high risk  pregnancy, antepartum Needs new OB labs at 11wks, including offer patient panorama then. Start low dose asa after 12 weeks  2. Type 2 diabetes mellitus with diabetic polyneuropathy, with long-term current use of insulin (HCC) Patient currently doing lantus 20u at bedtime. AM Fasting up to mid 150s and 2 hour PP in the 130s-170s. Recommend keeping the lantus at 20 at bedtime and to start meal coverage. Will start with 12u humalog tidac.    3. Chronic hypertension affecting pregnancy Doing well on no meds. Was on procardia xl but self d/c'ed before new OB visit   4. Multigravida of advanced maternal age in third trimester Offer panorama at 11wk lab draw   5. Acquired hypothyroidism Check TSH at 11wk lab draw. Patient increased to 112 qday last visit   6. History of severe pre-eclampsia Start low dose asa after 12 weeks   7. History of proteinuria in pregnancy PC ratio 923 last visit   8. Preexisting diabetes complicating pregnancy, antepartum See above   9. UTI in pregnancy Ucx toc late August  In person interpreter used but patient understood without  Preterm labor symptoms and general obstetric precautions including but not limited to vaginal bleeding, contractions, leaking of fluid and fetal movement were reviewed in detail with the patient. Please refer to After Visit Summary for other counseling recommendations.   Return in about 1 week (around 08/06/2022) for 7-10d, in person, high risk ob, md visit.  Future Appointments  Date Time Provider Department Center  08/29/2022  8:00 AM [place], Philippa Sicks, RD [facility]  09/19/2022  9:10 AM Grayce Sessions, NP [facility] None    [place] Bing, MD

## 2022-07-31 ENCOUNTER — Other Ambulatory Visit: Payer: Self-pay

## 2022-08-13 ENCOUNTER — Encounter: Payer: Self-pay | Admitting: Obstetrics and Gynecology

## 2022-08-28 ENCOUNTER — Other Ambulatory Visit: Payer: Self-pay

## 2022-08-28 ENCOUNTER — Ambulatory Visit (INDEPENDENT_AMBULATORY_CARE_PROVIDER_SITE_OTHER): Payer: Self-pay | Admitting: Obstetrics & Gynecology

## 2022-08-28 ENCOUNTER — Other Ambulatory Visit (HOSPITAL_COMMUNITY)
Admission: RE | Admit: 2022-08-28 | Discharge: 2022-08-28 | Disposition: A | Discharge status: Home / Self Care | Payer: Self-pay | Source: Ambulatory Visit | Attending: Obstetrics and Gynecology | Admitting: Obstetrics and Gynecology

## 2022-08-28 VITALS — BP 136/71 | HR 83 | Wt 173.8 lb

## 2022-08-28 DIAGNOSIS — O10919 Unspecified pre-existing hypertension complicating pregnancy, unspecified trimester: Secondary | ICD-10-CM

## 2022-08-28 DIAGNOSIS — Z794 Long term (current) use of insulin: Secondary | ICD-10-CM

## 2022-08-28 DIAGNOSIS — Z8759 Personal history of other complications of pregnancy, childbirth and the puerperium: Secondary | ICD-10-CM

## 2022-08-28 DIAGNOSIS — Z3A12 12 weeks gestation of pregnancy: Secondary | ICD-10-CM

## 2022-08-28 DIAGNOSIS — O0990 Supervision of high risk pregnancy, unspecified, unspecified trimester: Secondary | ICD-10-CM

## 2022-08-28 DIAGNOSIS — O09521 Supervision of elderly multigravida, first trimester: Secondary | ICD-10-CM

## 2022-08-28 DIAGNOSIS — O0991 Supervision of high risk pregnancy, unspecified, first trimester: Secondary | ICD-10-CM

## 2022-08-28 DIAGNOSIS — E1142 Type 2 diabetes mellitus with diabetic polyneuropathy: Secondary | ICD-10-CM

## 2022-08-28 DIAGNOSIS — E039 Hypothyroidism, unspecified: Secondary | ICD-10-CM

## 2022-08-28 DIAGNOSIS — O10911 Unspecified pre-existing hypertension complicating pregnancy, first trimester: Secondary | ICD-10-CM

## 2022-08-28 NOTE — Progress Notes (Signed)
   PRENATAL VISIT NOTE  Subjective:  Audrey Mcmillan is a 42 y.o. [phone] at [redacted]w[redacted]d being seen today for ongoing prenatal care.  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  Patient reports no complaints.  Contractions: Not present. Vag. Bleeding: None.  Movement: Absent. Denies leaking of fluid.   The following portions of the patient's history were reviewed and updated as appropriate: allergies, current medications, past family history, past medical history, past social history, past surgical history and problem list.   Objective:   Vitals:   08/28/22 1140  BP: 136/71  Pulse: 83  Weight: 173 lb 12.8 oz (78.8 kg)    Fetal Status: Fetal Heart Rate (bpm): 162   Movement: Absent     General:  Alert, oriented and cooperative. Patient is in no acute distress.  Skin: Skin is warm and dry. No rash noted.   Cardiovascular: Normal heart rate noted  Respiratory: Normal respiratory effort, no problems with respiration noted  Abdomen: Soft, gravid, appropriate for gestational age.  Pain/Pressure: Absent     Pelvic: Cervical exam deferred        Extremities: Normal range of motion.  Edema: None  Mental Status: Normal mood and affect. Normal behavior. Normal judgment and thought content.   Assessment and Plan:  Pregnancy: G3P1102 at [redacted]w[redacted]d 1. Supervision of high risk pregnancy, antepartum Routine  screening - GC/Chlamydia probe amp ([place])not at [facility] - CBC/D/Plt+RPR+Rh+ABO+RubIgG... - Hemoglobin A1c - TSH - PANORAMA PRENATAL TEST - [place] Basic Panel  2. Chronic hypertension affecting pregnancy No meds  3. Type 2 diabetes mellitus with diabetic polyneuropathy, with long-term current use of insulin (HCC) FBS in 80's and PP usually <120  4. History of severe pre-eclampsia   5. Multigravida of advanced maternal age in first trimester   Preterm labor symptoms and general obstetric precautions including but not limited to vaginal bleeding, contractions, leaking of fluid and fetal movement were reviewed in detail with the patient. Please refer to After Visit Summary for other counseling recommendations.   Return in about 4 weeks (around 09/25/2022).  Future Appointments  Date Time Provider Department Center  09/19/2022  9:10 AM Grayce Sessions, NP [facility] None    Scheryl Darter, MD

## 2022-08-29 ENCOUNTER — Ambulatory Visit: Payer: Self-pay | Admitting: Dietician

## 2022-08-30 ENCOUNTER — Other Ambulatory Visit: Payer: Self-pay

## 2022-08-30 MED ORDER — LEVOTHYROXINE SODIUM 125 MCG PO TABS
125.0000 ug | ORAL_TABLET | Freq: Every day | ORAL | 1 refills | Status: DC
Start: 2022-08-30 — End: 2022-11-15
  Filled 2022-08-30: qty 42, 42d supply, fill #0

## 2022-08-30 NOTE — Addendum Note (Signed)
Addended by: Adam Phenix on: 08/30/2022 08:29 AM   Modules accepted: Orders

## 2022-08-31 ENCOUNTER — Other Ambulatory Visit: Payer: Self-pay

## 2022-09-06 ENCOUNTER — Other Ambulatory Visit: Payer: Self-pay

## 2022-09-07 ENCOUNTER — Other Ambulatory Visit: Payer: Self-pay

## 2022-09-13 LAB — HORIZON CUSTOM: REPORT SUMMARY: NEGATIVE

## 2022-09-19 ENCOUNTER — Encounter (INDEPENDENT_AMBULATORY_CARE_PROVIDER_SITE_OTHER): Payer: Self-pay

## 2022-09-19 ENCOUNTER — Ambulatory Visit (INDEPENDENT_AMBULATORY_CARE_PROVIDER_SITE_OTHER): Payer: Self-pay | Admitting: Primary Care

## 2022-09-21 ENCOUNTER — Other Ambulatory Visit: Payer: Self-pay

## 2022-09-25 ENCOUNTER — Encounter: Payer: Self-pay | Admitting: Obstetrics and Gynecology

## 2022-09-26 ENCOUNTER — Other Ambulatory Visit: Payer: Self-pay

## 2022-09-26 ENCOUNTER — Ambulatory Visit (INDEPENDENT_AMBULATORY_CARE_PROVIDER_SITE_OTHER): Payer: Self-pay | Admitting: Obstetrics and Gynecology

## 2022-09-26 VITALS — BP 113/75 | HR 82 | Wt 177.0 lb

## 2022-09-26 DIAGNOSIS — O24319 Unspecified pre-existing diabetes mellitus in pregnancy, unspecified trimester: Secondary | ICD-10-CM

## 2022-09-26 DIAGNOSIS — E039 Hypothyroidism, unspecified: Secondary | ICD-10-CM

## 2022-09-26 DIAGNOSIS — Z794 Long term (current) use of insulin: Secondary | ICD-10-CM

## 2022-09-26 DIAGNOSIS — E1142 Type 2 diabetes mellitus with diabetic polyneuropathy: Secondary | ICD-10-CM

## 2022-09-26 DIAGNOSIS — O0990 Supervision of high risk pregnancy, unspecified, unspecified trimester: Secondary | ICD-10-CM

## 2022-09-26 DIAGNOSIS — Z3A17 17 weeks gestation of pregnancy: Secondary | ICD-10-CM

## 2022-09-26 DIAGNOSIS — O2341 Unspecified infection of urinary tract in pregnancy, first trimester: Secondary | ICD-10-CM

## 2022-09-26 DIAGNOSIS — O10919 Unspecified pre-existing hypertension complicating pregnancy, unspecified trimester: Secondary | ICD-10-CM

## 2022-09-26 DIAGNOSIS — O09522 Supervision of elderly multigravida, second trimester: Secondary | ICD-10-CM

## 2022-09-26 DIAGNOSIS — Z8759 Personal history of other complications of pregnancy, childbirth and the puerperium: Secondary | ICD-10-CM

## 2022-09-26 MED ORDER — INSULIN GLARGINE 100 UNIT/ML SOLOSTAR PEN
10.0000 [IU] | PEN_INJECTOR | Freq: Every day | SUBCUTANEOUS | 5 refills | Status: DC
  Filled 2022-09-26: qty 3, 30d supply, fill #0

## 2022-09-26 MED ORDER — ASPIRIN 81 MG PO CHEW
81.0000 mg | CHEWABLE_TABLET | Freq: Every day | ORAL | 6 refills | Status: DC
  Filled 2022-09-26: qty 90, 90d supply, fill #0
  Filled 2022-12-13: qty 90, 90d supply, fill #1

## 2022-09-26 NOTE — Progress Notes (Signed)
   PRENATAL VISIT NOTE  Subjective:  Audrey Mcmillan is a 42 y.o. [phone] at [redacted]w[redacted]d being seen today for ongoing prenatal care.  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  Patient doing well with no acute concerns today. She reports no complaints.  Contractions: Not present. Vag. Bleeding: None.  Movement: Absent. Denies leaking of fluid.   The following portions of the patient's history were reviewed and updated as appropriate: allergies, current medications, past family history, past medical history, past social history, past surgical history and problem list. Problem list updated.  Objective:   Vitals:   09/26/22 0842  BP: 113/75  Pulse: 82  Weight: 177 lb (80.3 kg)    Fetal Status: Fetal Heart Rate (bpm): 153 Fundal Height: 17 cm Movement: Absent     General:  Alert, oriented and cooperative. Patient is in no acute distress.  Skin: Skin is warm and dry. No rash noted.   Cardiovascular: Normal heart rate noted  Respiratory: Normal respiratory effort, no problems with respiration noted  Abdomen: Soft, gravid, appropriate for gestational age.  Pain/Pressure: Absent     Pelvic: Cervical exam deferred        Extremities: Normal range of motion.  Edema: None  Mental Status:  Normal mood and affect. Normal behavior. Normal judgment and thought content.   Assessment and Plan:  Pregnancy: G3P1102  at [redacted]w[redacted]d  1. Supervision of high risk pregnancy, antepartum Continue routine prenatal care - AFP, Serum, Open Spina Bifida - Culture, OB Urine  2. Urinary tract infection in mother during first trimester of pregnancy  - Culture, OB Urine  3. [redacted] weeks gestation of pregnancy   4. Chronic hypertension affecting pregnancy Patient on no meds, has taken procardia in the past, BP completely normal without meds  5. Acquired hypothyroidism Pt continues with synthroid  6. Preexisting diabetes complicating pregnancy, antepartum Pt has not been taking lantus, was told not good for pregnancy, pt was reassured it is fine and was given supporting documents.  7. Multigravida of advanced maternal age in second trimester   8. History of severe pre-eclampsia Rx for baby ASA sent  9. Type 2 diabetes mellitus with diabetic polyneuropathy, with long-term current use of insulin (HCC) Anatomy /growth scan sent to [facility], pt advised to get financial agreement as she will need multiple scans with MFM due to other diagnoses Pt did not bring blood sugars.  She was strongly advised blood sugars need to be brought in every visit  - insulin glargine (LANTUS) 100 UNIT/ML Solostar Pen; Inject 10 Units into the skin at bedtime.  Dispense: 15 mL; Refill: 5  Preterm labor symptoms and general obstetric precautions including but not limited to vaginal bleeding, contractions, leaking of fluid and fetal movement were reviewed in detail with the patient.  Please refer to After Visit Summary for other counseling recommendations.   Return in about 3 weeks (around 10/17/2022) for [facility], in person.   Mariel Aloe, MD Faculty Attending Center for [facility]

## 2022-09-28 ENCOUNTER — Other Ambulatory Visit: Payer: Self-pay

## 2022-09-28 LAB — AFP, SERUM, OPEN SPINA BIFIDA
AFP MoM: 0.8
AFP Value: 21.8 ng/mL
Gest. Age on Collection Date: 17 wk
Maternal Age At EDD: 41.2 a
OSBR Risk 1 IN: 7789
Test Results:: NEGATIVE
Weight: 177 [lb_av]

## 2022-10-01 ENCOUNTER — Telehealth: Payer: Self-pay

## 2022-10-01 DIAGNOSIS — B9620 Unspecified Escherichia coli [E. coli] as the cause of diseases classified elsewhere: Secondary | ICD-10-CM

## 2022-10-01 LAB — URINE CULTURE, OB REFLEX

## 2022-10-01 LAB — CULTURE, OB URINE

## 2022-10-01 NOTE — Telephone Encounter (Signed)
Attempted to call patient at number listed in chart--sent to unidentified VM--left message to call office back to discuss test results and next steps per Dr. Donavan Foil.   Maureen Ralphs RN on 10/01/22 at [phone]

## 2022-10-01 NOTE — Telephone Encounter (Signed)
-----   Message from Warden Fillers sent at 10/01/2022  3:40 PM EDT ----- Urine culture shows UTI with e coli would treat with keflex 500 mg po 4 times a day for 7 days

## 2022-10-04 ENCOUNTER — Other Ambulatory Visit: Payer: Self-pay

## 2022-10-04 MED ORDER — CEPHALEXIN 500 MG PO CAPS
500.0000 mg | ORAL_CAPSULE | Freq: Four times a day (QID) | ORAL | 0 refills | Status: DC
Start: 2022-10-04 — End: 2022-10-18
  Filled 2022-10-04: qty 28, 7d supply, fill #0

## 2022-10-04 NOTE — Addendum Note (Signed)
Addended by: Maxwell Marion E on: 10/04/2022 11:16 AM   Modules accepted: Orders

## 2022-10-04 NOTE — Telephone Encounter (Signed)
Called pt; results and provider recommendation reviewed. Keflex sent as instructed by provider.

## 2022-10-05 ENCOUNTER — Other Ambulatory Visit: Payer: Self-pay

## 2022-10-18 ENCOUNTER — Ambulatory Visit (INDEPENDENT_AMBULATORY_CARE_PROVIDER_SITE_OTHER): Payer: Self-pay | Admitting: Obstetrics and Gynecology

## 2022-10-18 ENCOUNTER — Other Ambulatory Visit: Payer: Self-pay

## 2022-10-18 ENCOUNTER — Encounter: Payer: Self-pay | Admitting: Obstetrics and Gynecology

## 2022-10-18 VITALS — BP 127/76 | HR 90 | Wt 180.8 lb

## 2022-10-18 DIAGNOSIS — E1142 Type 2 diabetes mellitus with diabetic polyneuropathy: Secondary | ICD-10-CM

## 2022-10-18 DIAGNOSIS — O09522 Supervision of elderly multigravida, second trimester: Secondary | ICD-10-CM

## 2022-10-18 DIAGNOSIS — O24319 Unspecified pre-existing diabetes mellitus in pregnancy, unspecified trimester: Secondary | ICD-10-CM

## 2022-10-18 DIAGNOSIS — O2341 Unspecified infection of urinary tract in pregnancy, first trimester: Secondary | ICD-10-CM

## 2022-10-18 DIAGNOSIS — Z794 Long term (current) use of insulin: Secondary | ICD-10-CM

## 2022-10-18 DIAGNOSIS — O0990 Supervision of high risk pregnancy, unspecified, unspecified trimester: Secondary | ICD-10-CM

## 2022-10-18 DIAGNOSIS — O10919 Unspecified pre-existing hypertension complicating pregnancy, unspecified trimester: Secondary | ICD-10-CM

## 2022-10-18 DIAGNOSIS — E039 Hypothyroidism, unspecified: Secondary | ICD-10-CM

## 2022-10-18 MED ORDER — INSULIN LISPRO (1 UNIT DIAL) 100 UNIT/ML (KWIKPEN)
14.0000 [IU] | PEN_INJECTOR | Freq: Three times a day (TID) | SUBCUTANEOUS | 11 refills | Status: DC
  Filled 2022-10-18 – 2022-11-01 (×2): qty 15, 36d supply, fill #0
  Filled 2022-12-13: qty 15, 36d supply, fill #1

## 2022-10-18 MED ORDER — INSULIN GLARGINE 100 UNIT/ML SOLOSTAR PEN
12.0000 [IU] | PEN_INJECTOR | Freq: Every day | SUBCUTANEOUS | 5 refills | Status: DC
Start: 2022-10-18 — End: 2022-12-31
  Filled 2022-10-18: qty 3, 25d supply, fill #0

## 2022-10-18 NOTE — Progress Notes (Signed)
Subjective:  Audrey Mcmillan is a 42 y.o. [phone] at [redacted]w[redacted]d being seen today for ongoing prenatal care.  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  Patient reports no complaints.  Contractions: Not present. Vag. Bleeding: None.  Movement: Present. Denies leaking of fluid.   The following portions of the patient's history were reviewed and updated as appropriate: allergies, current medications, past family history, past medical history, past social history, past surgical history and problem list. Problem list updated.  Objective:   Vitals:   10/18/22 0931  BP: 127/76  Pulse: 90  Weight: 180 lb 12.8 oz (82 kg)    Fetal Status: Fetal Heart Rate (bpm): 160   Movement: Present     General:  Alert, oriented and cooperative. Patient is in no acute distress.  Skin: Skin is warm and dry. No rash noted.   Cardiovascular: Normal heart rate noted  Respiratory: Normal respiratory effort, no problems with respiration noted  Abdomen: Soft, gravid, appropriate for gestational age. Pain/Pressure: Absent     Pelvic:  Cervical exam deferred        Extremities: Normal range of motion.  Edema: None  Mental Status: Normal mood and affect. Normal behavior. Normal judgment and thought content.   Urinalysis:      Assessment and Plan:  Pregnancy: G3P1102 at [redacted]w[redacted]d  1. Supervision of high risk pregnancy,  antepartum Stable Anatomy scan this week with [facility]  2. Chronic hypertension affecting pregnancy BP stable Continue with every day BASA  3. Preexisting diabetes complicating pregnancy, antepartum CBG's for the most part in goal range Will increase insulin regiment by 2 units across the board to improve control U/S as noted above  4. Multigravida of advanced maternal age in second trimester Normal genetics  5. Acquired hypothyroidism TSH with 28 week labs  6. Urinary tract infection in mother during first trimester of pregnancy S/P TX - Culture, OB Urine  7. Type 2 diabetes mellitus with diabetic polyneuropathy, with long-term current use of insulin (HCC) See above - insulin glargine (LANTUS) 100 UNIT/ML Solostar Pen; Inject 12 Units into the skin at bedtime.  Dispense: 15 mL; Refill: 5 - insulin lispro (HUMALOG) 100 UNIT/ML KwikPen; Inject 14 Units into the skin 3 (three) times daily with meals.  Dispense: 15 mL; Refill: 11  Preterm labor symptoms and general obstetric precautions including but not limited to vaginal bleeding, contractions, leaking of fluid and fetal movement were reviewed in detail with the patient. Please refer to After Visit Summary for other counseling recommendations.  No follow-ups on file.   [name], MD

## 2022-10-21 LAB — URINE CULTURE, OB REFLEX

## 2022-10-21 LAB — CULTURE, OB URINE

## 2022-10-22 ENCOUNTER — Telehealth: Payer: Self-pay | Admitting: *Deleted

## 2022-10-22 ENCOUNTER — Other Ambulatory Visit: Payer: Self-pay

## 2022-10-22 MED ORDER — AMOXICILLIN-POT CLAVULANATE 875-125 MG PO TABS
1.0000 | ORAL_TABLET | Freq: Two times a day (BID) | ORAL | 0 refills | Status: DC
  Filled 2022-10-22: qty 14, 7d supply, fill #0

## 2022-10-22 NOTE — Telephone Encounter (Addendum)
-----   Message from [name] sent at 10/22/2022  9:14 AM EDT ----- Please let Audrey Mcmillan know that she still has a UTI. Please send in Augmentin [phone] 1 po bid x 7 days Thanks Casimiro Needle   9/30  1345  Called Audrey Mcmillan and informed her of test result and prescription sent to pharmacy.  She voiced understanding.

## 2022-10-23 ENCOUNTER — Other Ambulatory Visit: Payer: Self-pay

## 2022-10-24 ENCOUNTER — Other Ambulatory Visit: Payer: Self-pay

## 2022-11-02 ENCOUNTER — Other Ambulatory Visit: Payer: Self-pay

## 2022-11-15 ENCOUNTER — Ambulatory Visit (INDEPENDENT_AMBULATORY_CARE_PROVIDER_SITE_OTHER): Payer: Self-pay | Admitting: Obstetrics and Gynecology

## 2022-11-15 ENCOUNTER — Other Ambulatory Visit: Payer: Self-pay

## 2022-11-15 VITALS — BP 136/71 | HR 82 | Wt 188.1 lb

## 2022-11-15 DIAGNOSIS — O2341 Unspecified infection of urinary tract in pregnancy, first trimester: Secondary | ICD-10-CM

## 2022-11-15 DIAGNOSIS — Z3A24 24 weeks gestation of pregnancy: Secondary | ICD-10-CM

## 2022-11-15 DIAGNOSIS — E1142 Type 2 diabetes mellitus with diabetic polyneuropathy: Secondary | ICD-10-CM

## 2022-11-15 DIAGNOSIS — Z794 Long term (current) use of insulin: Secondary | ICD-10-CM

## 2022-11-15 DIAGNOSIS — O09522 Supervision of elderly multigravida, second trimester: Secondary | ICD-10-CM

## 2022-11-15 DIAGNOSIS — O10919 Unspecified pre-existing hypertension complicating pregnancy, unspecified trimester: Secondary | ICD-10-CM

## 2022-11-15 DIAGNOSIS — O0990 Supervision of high risk pregnancy, unspecified, unspecified trimester: Secondary | ICD-10-CM

## 2022-11-15 DIAGNOSIS — E039 Hypothyroidism, unspecified: Secondary | ICD-10-CM

## 2022-11-15 MED ORDER — LEVOTHYROXINE SODIUM 125 MCG PO TABS
125.0000 ug | ORAL_TABLET | Freq: Every day | ORAL | 1 refills | Status: DC
  Filled 2022-11-15: qty 42, 42d supply, fill #0
  Filled 2023-01-02 – 2023-01-03 (×2): qty 42, 42d supply, fill #1

## 2022-11-15 NOTE — Addendum Note (Signed)
Addended by: Sue Lush on: 11/15/2022 10:29 AM   Modules accepted: Orders

## 2022-11-15 NOTE — Progress Notes (Signed)
   PRENATAL VISIT NOTE  Subjective:  Audrey Mcmillan is a 42 y.o. [phone] at [redacted]w[redacted]d being seen today for ongoing prenatal care.  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  Patient reports no complaints.  Contractions: Not present. Vag. Bleeding: None.  Movement: Present. Denies leaking of fluid.   The following portions of the patient's history were reviewed and updated as appropriate: allergies, current medications, past family history, past medical history, past social history, past surgical history and problem list.   Objective:   Vitals:   11/15/22 0947  BP: 136/71  Pulse: 82  Weight: 188 lb 1.6 oz (85.3 kg)    Fetal Status: Fetal Heart Rate (bpm): 152 Fundal Height: 24 cm Movement: Present     General:  Alert, oriented and cooperative. Patient is in no acute distress.  Skin: Skin is warm and dry. No rash noted.   Cardiovascular: Normal heart rate noted  Respiratory: Normal respiratory effort, no problems with respiration noted  Abdomen: Soft, gravid, appropriate for gestational age.  Pain/Pressure: Present     Pelvic: Cervical exam deferred        Extremities: Normal range of motion.  Edema: None  Mental Status: Normal mood and affect. Normal behavior. Normal judgment and thought content.   Assessment and Plan:  Pregnancy: G3P1102 at [redacted]w[redacted]d 1. Supervision of high risk pregnancy,  antepartum BP and FHR normal Feeling regular fetal movement   2. Urinary tract infection in mother during first trimester of pregnancy TOC today  3. Chronic hypertension affecting pregnancy Normotensive, continue ASA  4. Multigravida of advanced maternal age in second trimester Normal genetics, continue ASA  5. [redacted] weeks gestation of pregnancy Anticipatory guidance regarding upcoming appts  6. Type 2 diabetes mellitus with diabetic polyneuropathy, with long-term current use of insulin (HCC) Well controlled on insulin, lantus at bedtime, humalog TID w. meals Will schedule growth u/s for 28 wks  Preterm labor symptoms and general obstetric precautions including but not limited to vaginal bleeding, contractions, leaking of fluid and fetal movement were reviewed in detail with the patient. Please refer to After Visit Summary for other counseling recommendations.   Return in about 4 weeks (around 12/13/2022) for OB VISIT (MD or APP).   Albertine Grates, FNP

## 2022-11-16 ENCOUNTER — Other Ambulatory Visit: Payer: Self-pay

## 2022-11-16 ENCOUNTER — Telehealth: Payer: Self-pay

## 2022-11-16 NOTE — Telephone Encounter (Addendum)
Pt scheduled for [facility] [place] during visit yesterday. Pt was seen at [facility] for anatomy so assumption made patient is a part of [employer] program. Called pt with interpreter Debarah Crape to clarify and patient states she is not a part of this program. Has applied and been approved for [facility] Financial Assistance at 100%. Anatomy scheduled with MFM at 28 weeks since anatomy previously done with [facility] and growth [place] needed at 28 weeks. Appt will be cancelled with [facility].

## 2022-11-19 LAB — URINE CULTURE

## 2022-11-26 ENCOUNTER — Other Ambulatory Visit: Payer: Self-pay

## 2022-12-05 ENCOUNTER — Other Ambulatory Visit: Payer: Self-pay

## 2022-12-07 ENCOUNTER — Other Ambulatory Visit: Payer: Self-pay

## 2022-12-13 ENCOUNTER — Ambulatory Visit (INDEPENDENT_AMBULATORY_CARE_PROVIDER_SITE_OTHER): Payer: Self-pay | Admitting: Certified Nurse Midwife

## 2022-12-13 ENCOUNTER — Other Ambulatory Visit: Payer: Self-pay

## 2022-12-13 VITALS — BP 147/84 | HR 84 | Wt 190.5 lb

## 2022-12-13 DIAGNOSIS — O24113 Pre-existing diabetes mellitus, type 2, in pregnancy, third trimester: Secondary | ICD-10-CM

## 2022-12-13 DIAGNOSIS — O0992 Supervision of high risk pregnancy, unspecified, second trimester: Secondary | ICD-10-CM

## 2022-12-13 DIAGNOSIS — Z23 Encounter for immunization: Secondary | ICD-10-CM

## 2022-12-13 DIAGNOSIS — O09523 Supervision of elderly multigravida, third trimester: Secondary | ICD-10-CM

## 2022-12-13 DIAGNOSIS — O0990 Supervision of high risk pregnancy, unspecified, unspecified trimester: Secondary | ICD-10-CM

## 2022-12-13 DIAGNOSIS — Z794 Long term (current) use of insulin: Secondary | ICD-10-CM

## 2022-12-13 DIAGNOSIS — O99283 Endocrine, nutritional and metabolic diseases complicating pregnancy, third trimester: Secondary | ICD-10-CM

## 2022-12-13 DIAGNOSIS — O10919 Unspecified pre-existing hypertension complicating pregnancy, unspecified trimester: Secondary | ICD-10-CM

## 2022-12-13 DIAGNOSIS — E1142 Type 2 diabetes mellitus with diabetic polyneuropathy: Secondary | ICD-10-CM

## 2022-12-13 DIAGNOSIS — O09522 Supervision of elderly multigravida, second trimester: Secondary | ICD-10-CM

## 2022-12-13 DIAGNOSIS — Z3A31 31 weeks gestation of pregnancy: Secondary | ICD-10-CM

## 2022-12-13 DIAGNOSIS — E039 Hypothyroidism, unspecified: Secondary | ICD-10-CM

## 2022-12-13 MED ORDER — NIFEDIPINE ER OSMOTIC RELEASE 30 MG PO TB24
30.0000 mg | ORAL_TABLET | Freq: Every day | ORAL | 2 refills | Status: DC
  Filled 2022-12-13: qty 60, 60d supply, fill #0

## 2022-12-13 NOTE — Progress Notes (Signed)
   PRENATAL VISIT NOTE  Subjective:  Audrey Mcmillan is a 42 y.o. G3P1102 at [redacted]w[redacted]d being seen today for ongoing prenatal care.  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  Patient reports no complaints.  Contractions: Not present. Vag. Bleeding: None.  Movement: Present. Denies leaking of fluid.   The following portions of the patient's history were reviewed and updated as appropriate: allergies, current medications, past family history, past medical history, past social history, past surgical history and problem list.   Objective:   Vitals:   12/13/22 1049 12/13/22 1104  BP: (!) 148/84 (!) 147/84  Pulse: 84 84  Weight: 86.4 kg     Fetal Status: Fetal Heart Rate (bpm): 135   Movement: Present     General:  Alert, oriented and cooperative. Patient is in no acute distress.  Skin: Skin is warm and dry. No rash noted.      Respiratory: Normal respiratory effort  Abdomen: Soft, gravid, appropriate for gestational age.  Pain/Pressure: Present     Pelvic: Cervical exam deferred        Extremities: Normal range of motion.  Edema: Moderate pitting, indentation subsides rapidly  Mental Status: Normal mood and affect. Normal behavior. Normal judgment and thought content.   Assessment and Plan:  Pregnancy: G3P1102 at [redacted]w[redacted]d 1. Supervision of high risk pregnancy in second trimester - CBC - RPR - HIV  antibody (with reflex) - Tdap vaccine greater than or equal to 7yo IM - Comp Met (CMET) - Protein / creatinine ratio, urine  2. Multigravida of advanced maternal age in second trimester -Desires Mirena IUD for [facility]  3. Chronic hypertension affecting pregnancy -Hypertensive today. Mild HA upon awakening but resides soon after - Comp Met (CMET) - Protein / creatinine ratio, urine  4. Type 2 diabetes mellitus with diabetic polyneuropathy, with long-term current use of insulin (HCC) -Forgot to bring logs. Reports fasting BG 80-90s and 2hpp 120-130s. Advised pt to bring logs next visit -Insulin typically managed by PCP outside of pregnancy - Comp Met (CMET) - Protein / creatinine ratio, urine  5. Supervision of high risk pregnancy, antepartum - Comp Met (CMET) - Protein / creatinine ratio, urine -Follow-up HROB with MD  6. Acquired hypothyroidism - TSH today -Managed usually by PCP outside of pregnancy  Preterm labor symptoms and general obstetric precautions including but not limited to vaginal bleeding, contractions, leaking of fluid and fetal movement were reviewed in detail with the patient. Please refer to After Visit Summary for other counseling recommendations.   Return in about 2 weeks (around 12/27/2022) for HROB with MD.  Future Appointments  Date Time Provider Department Center  12/17/2022  1:15 PM [facility] NURSE [facility]  12/17/2022  1:30 PM [facility]    Darrell Jewel, Student-MidWife

## 2022-12-14 ENCOUNTER — Other Ambulatory Visit: Payer: Self-pay

## 2022-12-14 LAB — CBC
Hematocrit: 36.4 % (ref 34.0–46.6)
Hemoglobin: 11.9 g/dL (ref 11.1–15.9)
MCH: 31.2 pg (ref 26.6–33.0)
MCHC: 32.7 g/dL (ref 31.5–35.7)
MCV: 95 fL (ref 79–97)
Platelets: 296 10*3/uL (ref 150–450)
RBC: 3.82 x10E6/uL (ref 3.77–5.28)
RDW: 12.1 % (ref 11.7–15.4)
WBC: 12.5 10*3/uL — ABNORMAL HIGH (ref 3.4–10.8)

## 2022-12-14 LAB — COMPREHENSIVE METABOLIC PANEL
ALT: 14 [IU]/L (ref 0–32)
AST: 11 [IU]/L (ref 0–40)
Albumin: 3.6 g/dL — ABNORMAL LOW (ref 3.9–4.9)
Alkaline Phosphatase: 102 [IU]/L (ref 44–121)
BUN/Creatinine Ratio: 18 (ref 9–23)
BUN: 15 mg/dL (ref 6–24)
Bilirubin Total: 0.3 mg/dL (ref 0.0–1.2)
CO2: 16 mmol/L — ABNORMAL LOW (ref 20–29)
Calcium: 9.2 mg/dL (ref 8.7–10.2)
Chloride: 106 mmol/L (ref 96–106)
Creatinine, Ser: 0.83 mg/dL (ref 0.57–1.00)
Globulin, Total: 3 g/dL (ref 1.5–4.5)
Glucose: 150 mg/dL — ABNORMAL HIGH (ref 70–99)
Potassium: 4.3 mmol/L (ref 3.5–5.2)
Sodium: 137 mmol/L (ref 134–144)
Total Protein: 6.6 g/dL (ref 6.0–8.5)
eGFR: 91 mL/min/{1.73_m2} (ref >59)

## 2022-12-14 LAB — HIV ANTIBODY (ROUTINE TESTING W REFLEX): HIV Screen 4th Generation wRfx: NONREACTIVE

## 2022-12-14 LAB — TSH: TSH: 2.1 u[IU]/mL (ref 0.450–4.500)

## 2022-12-14 LAB — RPR: RPR Ser Ql: NONREACTIVE

## 2022-12-15 LAB — PROTEIN / CREATININE RATIO, URINE
Creatinine, Urine: 60.8 mg/dL
Protein, Ur: 122.1 mg/dL
Protein/Creat Ratio: 2008 mg/g{creat} — ABNORMAL HIGH (ref 0–200)

## 2022-12-17 ENCOUNTER — Ambulatory Visit: Payer: Self-pay

## 2022-12-18 ENCOUNTER — Other Ambulatory Visit: Payer: Self-pay

## 2022-12-31 ENCOUNTER — Ambulatory Visit (INDEPENDENT_AMBULATORY_CARE_PROVIDER_SITE_OTHER): Payer: Self-pay | Admitting: Obstetrics & Gynecology

## 2022-12-31 ENCOUNTER — Other Ambulatory Visit: Payer: Self-pay

## 2022-12-31 VITALS — BP 154/75 | HR 93 | Wt 193.0 lb

## 2022-12-31 DIAGNOSIS — O24113 Pre-existing diabetes mellitus, type 2, in pregnancy, third trimester: Secondary | ICD-10-CM

## 2022-12-31 DIAGNOSIS — O99283 Endocrine, nutritional and metabolic diseases complicating pregnancy, third trimester: Secondary | ICD-10-CM

## 2022-12-31 DIAGNOSIS — Z8759 Personal history of other complications of pregnancy, childbirth and the puerperium: Secondary | ICD-10-CM

## 2022-12-31 DIAGNOSIS — O24319 Unspecified pre-existing diabetes mellitus in pregnancy, unspecified trimester: Secondary | ICD-10-CM

## 2022-12-31 DIAGNOSIS — Z794 Long term (current) use of insulin: Secondary | ICD-10-CM

## 2022-12-31 DIAGNOSIS — O1211 Gestational proteinuria, first trimester: Secondary | ICD-10-CM

## 2022-12-31 DIAGNOSIS — O0990 Supervision of high risk pregnancy, unspecified, unspecified trimester: Secondary | ICD-10-CM

## 2022-12-31 DIAGNOSIS — O10919 Unspecified pre-existing hypertension complicating pregnancy, unspecified trimester: Secondary | ICD-10-CM

## 2022-12-31 DIAGNOSIS — Z3A30 30 weeks gestation of pregnancy: Secondary | ICD-10-CM

## 2022-12-31 DIAGNOSIS — O09522 Supervision of elderly multigravida, second trimester: Secondary | ICD-10-CM

## 2022-12-31 DIAGNOSIS — E1142 Type 2 diabetes mellitus with diabetic polyneuropathy: Secondary | ICD-10-CM

## 2022-12-31 DIAGNOSIS — O09523 Supervision of elderly multigravida, third trimester: Secondary | ICD-10-CM

## 2022-12-31 DIAGNOSIS — E038 Other specified hypothyroidism: Secondary | ICD-10-CM

## 2022-12-31 DIAGNOSIS — E039 Hypothyroidism, unspecified: Secondary | ICD-10-CM

## 2022-12-31 MED ORDER — INSULIN GLARGINE 100 UNIT/ML SOLOSTAR PEN
14.0000 [IU] | PEN_INJECTOR | Freq: Every day | SUBCUTANEOUS | 5 refills | Status: DC
  Filled 2022-12-31: qty 6, 42d supply, fill #0

## 2022-12-31 MED ORDER — NIFEDIPINE ER 60 MG PO TB24
60.0000 mg | ORAL_TABLET | Freq: Every day | ORAL | 3 refills | Status: DC
  Filled 2022-12-31: qty 30, 30d supply, fill #0

## 2022-12-31 MED ORDER — NIFEDIPINE ER 60 MG PO TB24
60.0000 mg | ORAL_TABLET | Freq: Every day | ORAL | 3 refills | Status: DC
  Filled 2022-12-31: qty 1, 1d supply, fill #0

## 2022-12-31 NOTE — Progress Notes (Signed)
PRENATAL VISIT NOTE  Subjective:  Audrey Mcmillan is a 42 y.o. [phone] at [redacted]w[redacted]d being seen today for ongoing prenatal care.  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  Patient reports no complaints.  Contractions: Not present. Vag. Bleeding: None.  Movement: Present. Denies leaking of fluid.   The following portions of the patient's history were reviewed and updated as appropriate: allergies, current medications, past family history, past medical history, past social history, past surgical history and problem list.   Objective:   Vitals:   12/31/22 1446 12/31/22 1501  BP: (!) 161/82 (!) 154/75  Pulse: 81 93  Weight: 193 lb (87.5 kg)     Fetal Status: Fetal Heart Rate (bpm): 153   Movement: Present     General:  Alert, oriented and cooperative. Patient is in no acute distress.  Skin: Skin is warm and dry. No rash noted.   Cardiovascular: Normal heart rate noted  Respiratory: Normal respiratory effort, no problems with respiration noted  Abdomen: Soft, gravid, appropriate for gestational age.  Pain/Pressure: Absent     Pelvic: Cervical exam deferred        Extremities: Normal range of motion.  Edema: Trace  Mental Status: Normal mood and affect. Normal behavior. Normal judgment and thought content.   Assessment and Plan:  Pregnancy: G3P1102 at [redacted]w[redacted]d 1. Chronic hypertension affecting  pregnancy - BP today elevated 161/82, repeat 154/75. Increased Procardia to 60mg  daily - NIFEdipine (ADALAT CC) 60 MG 24 hr tablet; Take 1 tablet (60 mg total) by mouth daily.  Dispense: 1 tablet; Refill: 3  2. Type 2 diabetes mellitus with diabetic polyneuropathy, with long-term current use of insulin (HCC) - Fasting BG 90-97, post-prandial 100-117. Increased bedtime dose to 14 Units.  - insulin glargine (LANTUS) 100 UNIT/ML Solostar Pen; Inject 14 Units into the skin at bedtime.  Dispense: 15 mL; Refill: 5  3. Multigravida of advanced maternal age in second trimester   4. Supervision of high risk pregnancy, antepartum - Follow up in 1 week due to elevated BP today.  5. History of severe pre-eclampsia  - Patient taking daily aspirin   6. Acquired hypothyroidism - Previous TSH lab 2 weeks ago wnl   7. Preexisting diabetes complicating pregnancy, antepartum  8. History of proteinuria in pregnancy   Preterm labor symptoms and general obstetric precautions including but not limited to vaginal bleeding, contractions, leaking of fluid and fetal movement were reviewed in detail with the patient. Please refer to After Visit Summary for other counseling recommendations.   No follow-ups on file.  Future Appointments  Date Time Provider Department Center  01/14/2023  9:15 AM [facility] NURSE [facility]  01/14/2023  9:30 AM [facility] US4 [facility]    MadisonCaitlin M Pepper, Student-PA  Attestation of Attending Supervision of PA Student: Evaluation and management procedures were performed by the PA student under my supervision and collaboration.  I have reviewed the student's note and chart, and I agree with the management and plan.  Scheryl Darter, MD, FACOG Attending Obstetrician & Gynecologist Faculty Practice, [facility]  [place]

## 2022-12-31 NOTE — Progress Notes (Signed)
Manual BP given to patient. Instructed patient how to check BP reading at home. Patient voiced understanding.  Marcelino Duster, RN

## 2023-01-03 ENCOUNTER — Other Ambulatory Visit: Payer: Self-pay

## 2023-01-04 ENCOUNTER — Other Ambulatory Visit: Payer: Self-pay

## 2023-01-07 ENCOUNTER — Encounter (HOSPITAL_COMMUNITY): Payer: Self-pay | Admitting: Obstetrics & Gynecology

## 2023-01-07 ENCOUNTER — Other Ambulatory Visit: Payer: Self-pay

## 2023-01-07 ENCOUNTER — Inpatient Hospital Stay (HOSPITAL_COMMUNITY)
Admission: AD | Admit: 2023-01-07 | Discharge: 2023-01-07 | Disposition: A | Discharge status: Home / Self Care | Payer: Self-pay | Attending: Obstetrics & Gynecology | Admitting: Obstetrics & Gynecology

## 2023-01-07 ENCOUNTER — Ambulatory Visit (INDEPENDENT_AMBULATORY_CARE_PROVIDER_SITE_OTHER): Payer: Self-pay | Admitting: Obstetrics and Gynecology

## 2023-01-07 VITALS — BP 153/88 | HR 79 | Wt 194.4 lb

## 2023-01-07 DIAGNOSIS — O10013 Pre-existing essential hypertension complicating pregnancy, third trimester: Secondary | ICD-10-CM

## 2023-01-07 DIAGNOSIS — Z3A31 31 weeks gestation of pregnancy: Secondary | ICD-10-CM

## 2023-01-07 DIAGNOSIS — O0990 Supervision of high risk pregnancy, unspecified, unspecified trimester: Secondary | ICD-10-CM

## 2023-01-07 DIAGNOSIS — O24319 Unspecified pre-existing diabetes mellitus in pregnancy, unspecified trimester: Secondary | ICD-10-CM

## 2023-01-07 DIAGNOSIS — Z8759 Personal history of other complications of pregnancy, childbirth and the puerperium: Secondary | ICD-10-CM

## 2023-01-07 DIAGNOSIS — O99283 Endocrine, nutritional and metabolic diseases complicating pregnancy, third trimester: Secondary | ICD-10-CM

## 2023-01-07 DIAGNOSIS — O10919 Unspecified pre-existing hypertension complicating pregnancy, unspecified trimester: Secondary | ICD-10-CM

## 2023-01-07 DIAGNOSIS — O09523 Supervision of elderly multigravida, third trimester: Secondary | ICD-10-CM

## 2023-01-07 DIAGNOSIS — O10913 Unspecified pre-existing hypertension complicating pregnancy, third trimester: Secondary | ICD-10-CM | POA: Insufficient documentation

## 2023-01-07 DIAGNOSIS — R0602 Shortness of breath: Secondary | ICD-10-CM | POA: Insufficient documentation

## 2023-01-07 DIAGNOSIS — O359XX0 Maternal care for (suspected) fetal abnormality and damage, unspecified, not applicable or unspecified: Secondary | ICD-10-CM

## 2023-01-07 DIAGNOSIS — O1213 Gestational proteinuria, third trimester: Secondary | ICD-10-CM | POA: Insufficient documentation

## 2023-01-07 DIAGNOSIS — O09293 Supervision of pregnancy with other poor reproductive or obstetric history, third trimester: Secondary | ICD-10-CM | POA: Insufficient documentation

## 2023-01-07 DIAGNOSIS — Z794 Long term (current) use of insulin: Secondary | ICD-10-CM | POA: Insufficient documentation

## 2023-01-07 DIAGNOSIS — Z79899 Other long term (current) drug therapy: Secondary | ICD-10-CM | POA: Insufficient documentation

## 2023-01-07 DIAGNOSIS — E039 Hypothyroidism, unspecified: Secondary | ICD-10-CM

## 2023-01-07 DIAGNOSIS — O26893 Other specified pregnancy related conditions, third trimester: Secondary | ICD-10-CM | POA: Insufficient documentation

## 2023-01-07 DIAGNOSIS — O24113 Pre-existing diabetes mellitus, type 2, in pregnancy, third trimester: Secondary | ICD-10-CM | POA: Insufficient documentation

## 2023-01-07 DIAGNOSIS — O09522 Supervision of elderly multigravida, second trimester: Secondary | ICD-10-CM

## 2023-01-07 DIAGNOSIS — R808 Other proteinuria: Secondary | ICD-10-CM

## 2023-01-07 LAB — COMPREHENSIVE METABOLIC PANEL
ALT: 19 U/L (ref 0–44)
AST: 20 U/L (ref 15–41)
Albumin: 2.8 g/dL — ABNORMAL LOW (ref 3.5–5.0)
Alkaline Phosphatase: 100 U/L (ref 38–126)
Anion gap: 10 (ref 5–15)
BUN: 17 mg/dL (ref 6–20)
CO2: 18 mmol/L — ABNORMAL LOW (ref 22–32)
Calcium: 9.5 mg/dL (ref 8.9–10.3)
Chloride: 106 mmol/L (ref 98–111)
Creatinine, Ser: 1.23 mg/dL — ABNORMAL HIGH (ref 0.44–1.00)
GFR, Estimated: 57 mL/min — ABNORMAL LOW (ref >60)
Glucose, Bld: 284 mg/dL — ABNORMAL HIGH (ref 70–99)
Potassium: 3.9 mmol/L (ref 3.5–5.1)
Sodium: 134 mmol/L — ABNORMAL LOW (ref 135–145)
Total Bilirubin: 0.6 mg/dL (ref <1.2)
Total Protein: 6.5 g/dL (ref 6.5–8.1)

## 2023-01-07 LAB — CBC
HCT: 34.4 % — ABNORMAL LOW (ref 36.0–46.0)
Hemoglobin: 11.9 g/dL — ABNORMAL LOW (ref 12.0–15.0)
MCH: 30.8 pg (ref 26.0–34.0)
MCHC: 34.6 g/dL (ref 30.0–36.0)
MCV: 89.1 fL (ref 80.0–100.0)
Platelets: 223 10*3/uL (ref 150–400)
RBC: 3.86 MIL/uL — ABNORMAL LOW (ref 3.87–5.11)
RDW: 12.4 % (ref 11.5–15.5)
WBC: 10.5 10*3/uL (ref 4.0–10.5)
nRBC: 0 % (ref 0.0–0.2)

## 2023-01-07 LAB — PROTEIN / CREATININE RATIO, URINE
Creatinine, Urine: 126 mg/dL
Protein Creatinine Ratio: 1.53 mg/mg{creat} — ABNORMAL HIGH (ref 0.00–0.15)
Total Protein, Urine: 193 mg/dL

## 2023-01-07 MED ORDER — NIFEDIPINE ER OSMOTIC RELEASE 30 MG PO TB24
30.0000 mg | ORAL_TABLET | Freq: Two times a day (BID) | ORAL | 2 refills | Status: DC
  Filled 2023-01-07: qty 60, 30d supply, fill #0

## 2023-01-07 NOTE — Progress Notes (Signed)
   PRENATAL VISIT NOTE  Subjective:  Audrey Mcmillan is a 42 y.o. [phone] at [redacted]w[redacted]d being seen today for ongoing prenatal care.  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  Patient doing well with no acute concerns today. She reports no complaints.  Contractions: Not present. Vag. Bleeding: None.  Movement: Present. Denies leaking of fluid.   The following portions of the patient's history were reviewed and updated as appropriate: allergies, current medications, past family history, past medical history, past social history, past surgical history and problem list. Problem list updated.  Objective:   Vitals:   01/07/23 0823 01/07/23 0853  BP: (!) 169/90 (!) 153/88  Pulse: 79   Weight: 194 lb 6.4 oz (88.2 kg)     Fetal Status: Fetal Heart Rate (bpm): 140 Fundal Height: 32 cm Movement: Present     General:  Alert, oriented and cooperative. Patient is in no acute distress.  Skin: Skin is warm and dry. No rash noted.   Cardiovascular: Normal heart rate noted  Respiratory: Normal respiratory effort, no problems with respiration noted  Abdomen: Soft, gravid, appropriate for gestational age.  Pain/Pressure: Absent     Pelvic: Cervical exam deferred        Extremities: Normal range of motion.  Edema: None  Mental Status:  Normal mood and affect. Normal behavior. Normal judgment and thought content.    Assessment and Plan:  Pregnancy: G3P1102 at [redacted]w[redacted]d  1. [redacted] weeks gestation of pregnancy (Primary)   2. Chronic hypertension affecting pregnancy BP noted to be elevated x 2 Pt advised to increase procardia 60 mg po BID - [place] MFM FETAL BPP WO NON STRESS; Future  3. Preexisting diabetes complicating pregnancy, antepartum FBS: 93-98 PPBS: 106-125  Continue current regimen  May need to slightly increase long acting lantus if fastings remain elevated  Start weekly testing this week or next week  - [place] MFM FETAL BPP WO NON STRESS; Future  4. Acquired hypothyroidism   5. Multigravida of advanced maternal age in second trimester   6. History of severe pre-eclampsia MD concerned about pre-E due to elevated blood pressure refractory to meds.  BP was elevated last visit as well.  No recent labs drawn.  Pt advised to go to [facility] once she has child care for serial BP and labs May need to consider delivery at 36-37 weeks unless severe preeclampsia is diagnosed.  7. Supervision of high risk pregnancy, antepartum Continue routine prenatal care - [place] MFM FETAL BPP WO NON STRESS; Future  8. 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  Please refer to After Visit Summary for other counseling recommendations.   Return in about 2 weeks (around 01/21/2023) for [facility], in person.   Mariel Aloe, MD Faculty Attending Center for [facility]

## 2023-01-07 NOTE — Discharge Instructions (Signed)
You need to be seen in the office for re-evaluation this week by Wednesday or Thursday

## 2023-01-07 NOTE — [facility] Note (Signed)
.  Audrey Mcmillan is a 42 y.o. at [redacted]w[redacted]d here in [facility]U reporting: she was at the clinic for a follow up today and her b/p was too high so they sent her here. Denies headache , blurred vision, or RUQ pain. Pt reports slight shortness of breath that started this am. Denies abd pain, bleeding or ROM. Reports positive fetal movement.  Onset of complaint: today.  Pain score: 0/10 Vitals:   01/07/23 1124  BP: 119/68  Pulse: (!) 104  Resp: 17  Temp: 98.3 F (36.8 C)  SpO2: 100%     FHT:146  Lab orders placed from triage:

## 2023-01-07 NOTE — MAU Provider Note (Signed)
History     CSN: [id number]  Arrival date and time: 01/07/23 1058   Event Date/Time   First Provider Initiated Contact with Patient 01/07/23 1153      Chief Complaint  Patient presents with   Hypertension   HPI [name] is a 42 y.o. year old G9P1102 female at [redacted]w[redacted]d weeks gestation who was sent to MAU from her OB office for elevated blood pressures in the office of 169/90 and 153/88.  She also reports slight shortness of breath this morning.  She denies headache, blurred vision or right upper quadrant pain.  She denies abdominal pain, bleeding or rupture membranes.  She reports positive fetal movement.  Her pregnancy is complicated by type 2 diabetes and chronic hypertension.  She takes Procardia 30 mg every day her last dose was at 1030 this morning.  She receives prenatal care at [facility].  OB History     Gravida  3   Para  2   Term  1   Preterm  1   AB  0   Living  2      SAB  0   IAB  0   Ectopic  0   Multiple  0   Live Births  2           Past Medical History:  Diagnosis Date   Diabetes mellitus without complication (HCC)    type 2   Gestational hypertension, third trimester 11/14/2020   High cholesterol    Hyperthyroidism    Kidney stone complicating pregnancy, first trimester 06/06/2020   Marginal insertion of umbilical cord affecting management of mother 08/14/2020   Thyroid cancer (HCC)     Past Surgical History:  Procedure Laterality Date   NO PAST SURGERIES      Family History  Family history unknown: Yes    Social History   Tobacco Use   Smoking status: Never   Smokeless tobacco: Never  Vaping Use   Vaping status: Never Used  Substance Use Topics   Alcohol use: No    Comment: occ   Drug use: No    Allergies: No Known Allergies  Medications Prior to Admission  Medication Sig Dispense Refill Last Dose/Taking   aspirin 81 MG chewable tablet Chew 1 tablet (81 mg total) by mouth daily. 90 tablet 6 01/06/2023    insulin lispro (HUMALOG) 100 UNIT/ML KwikPen Inject 14 Units into the skin 3 (three) times daily with meals. 15 mL 11 01/07/2023   levothyroxine (SYNTHROID) 125 MCG tablet Take 1 tablet (125 mcg total) by mouth daily before breakfast. 42 tablet 1 01/07/2023   NIFEdipine (ADALAT CC) 60 MG 24 hr tablet Take 1 tablet (60 mg total) by mouth daily. 30 tablet 3 01/07/2023   Prenatal Vit-Fe Fumarate-FA (PREPLUS) 27-1 MG TABS Take 1 tablet by mouth daily. 60 tablet 2 01/06/2023   Blood Glucose Monitoring Suppl (TRUE METRIX METER) w/Device KIT Use as instructed 1 kit 0    glucose blood (TRUE METRIX BLOOD GLUCOSE TEST) test strip Use as instructed. Check blood glucose levels twice per day. 100 each 12    insulin glargine (LANTUS) 100 UNIT/ML Solostar Pen Inject 14 Units into the skin at bedtime. 15 mL 5    Insulin Pen Needle (TECHLITE PEN NEEDLES) 32G X 4 MM MISC Use to inject insulin up to 4 times daily as directed. 100 each 3    Insulin Pen Needle 32G X 4 MM MISC Use three times daily with Humalog. 100 each 11  Insulin Syringe-Needle U-100 30G X 5/16" 1 ML MISC Inject 1 application into the skin 2 times daily at 12 noon and 4 pm. 100 each 5    TRUEplus Lancets 28G MISC Use as instructed. Check blood glucose levels twice per day. 100 each 3     Review of Systems  Constitutional: Negative.   HENT: Negative.    Eyes: Negative.   Respiratory: Negative.    Cardiovascular: Negative.   Gastrointestinal: Negative.   Endocrine: Negative.   Genitourinary: Negative.   Musculoskeletal: Negative.   Skin: Negative.   Allergic/Immunologic: Negative.   Neurological: Negative.   Hematological: Negative.   Psychiatric/Behavioral: Negative.     Physical Exam   Patient Vitals for the past 24 hrs:  BP Temp Pulse Resp SpO2 Height Weight  01/07/23 1521 (!) 143/79 -- 83 -- -- -- --  01/07/23 1434 -- -- -- -- 100 % -- --  01/07/23 1430 (!) 141/74 -- 83 -- -- -- --  01/07/23 1429 -- -- -- -- 99 % -- --   01/07/23 1424 -- -- -- -- 100 % -- --  01/07/23 1419 -- -- -- -- 99 % -- --  01/07/23 1414 -- -- -- -- 99 % -- --  01/07/23 1409 -- -- -- -- 99 % -- --  01/07/23 1404 -- -- -- -- 99 % -- --  01/07/23 1400 132/74 -- 85 -- -- -- --  01/07/23 1359 -- -- -- -- 99 % -- --  01/07/23 1354 -- -- -- -- 99 % -- --  01/07/23 1349 -- -- -- -- 99 % -- --  01/07/23 1344 -- -- -- -- 99 % -- --  01/07/23 1339 -- -- -- -- 99 % -- --  01/07/23 1334 -- -- -- -- 99 % -- --  01/07/23 1330 128/73 -- 85 -- -- -- --  01/07/23 1329 -- -- -- -- 99 % -- --  01/07/23 1324 -- -- -- -- 99 % -- --  01/07/23 1319 -- -- -- -- 99 % -- --  01/07/23 1314 -- -- -- -- 99 % -- --  01/07/23 1309 -- -- -- -- 100 % -- --  01/07/23 1304 -- -- -- -- 100 % -- --  01/07/23 1300 131/72 -- 89 -- -- -- --  01/07/23 1259 -- -- -- -- 100 % -- --  01/07/23 1254 -- -- -- -- 99 % -- --  01/07/23 1250 -- -- -- -- 99 % -- --  01/07/23 1249 -- -- -- -- 99 % -- --  01/07/23 1245 125/70 -- 86 -- -- -- --  01/07/23 1244 -- -- -- -- 99 % -- --  01/07/23 1239 -- -- -- -- 100 % -- --  01/07/23 1234 -- -- -- -- 99 % -- --  01/07/23 1230 135/74 -- 92 -- -- -- --  01/07/23 1229 -- -- -- -- 100 % -- --  01/07/23 1224 -- -- -- -- 99 % -- --  01/07/23 1219 -- -- -- -- 99 % -- --  01/07/23 1215 131/74 -- 89 -- -- -- --  01/07/23 1214 -- -- -- -- 100 % -- --  01/07/23 1209 -- -- -- -- 99 % -- --  01/07/23 1204 -- -- -- -- 99 % -- --  01/07/23 1200 125/72 -- 97 -- -- -- --  01/07/23 1159 -- -- -- -- 100 % -- --  01/07/23 1154 -- -- -- -- 98 % -- --  01/07/23 1149 -- -- -- -- 99 % -- --  01/07/23 1145 123/73 -- 93 -- -- -- --  01/07/23 1144 -- -- -- -- 99 % -- --  01/07/23 1139 -- -- -- -- 99 % -- --  01/07/23 1134 -- -- -- -- 99 % -- --  01/07/23 1129 -- -- -- -- 99 % -- --  01/07/23 1126 119/68 -- (!) 101 -- -- -- --  01/07/23 1124 119/68 98.3 F (36.8 C) (!) 104 17 100 % 5' (1.524 m) 89.6 kg     Physical Exam Vitals and nursing note  reviewed.  Constitutional:      Appearance: Normal appearance. She is normal weight.  Cardiovascular:     Rate and Rhythm: Normal rate.  Pulmonary:     Effort: Pulmonary effort is normal.  Musculoskeletal:        General: Normal range of motion.  Skin:    General: Skin is warm and dry.  Neurological:     Mental Status: She is alert and oriented to person, place, and time.  Psychiatric:        Mood and Affect: Mood normal.        Behavior: Behavior normal.        Thought Content: Thought content normal.        Judgment: Judgment normal.    REACTIVE NST - FHR: 145 bpm / moderate variability / accels present / decels absent / TOCO: UI noted  MAU Course  Procedures  MDM CCUA CBC CMP P/C Ratio Serial BP's   Results for orders placed or performed during the hospital encounter of 01/07/23 (from the past 24 hours)  Protein / creatinine ratio, urine     Status: Abnormal   Collection Time: 01/07/23 11:30 AM  Result Value Ref Range   Creatinine, Urine 126 mg/dL   Total Protein, Urine 193 mg/dL   Protein Creatinine Ratio 1.53 (H) 0.00 - 0.15 mg/mg[Cre]  CBC     Status: Abnormal   Collection Time: 01/07/23 11:56 AM  Result Value Ref Range   WBC 10.5 4.0 - 10.5 K/uL   RBC 3.86 (L) 3.87 - 5.11 MIL/uL   Hemoglobin 11.9 (L) 12.0 - 15.0 g/dL   HCT 16.1 (L) 09.6 - 04.5 %   MCV 89.1 80.0 - 100.0 fL   MCH 30.8 26.0 - 34.0 pg   MCHC 34.6 30.0 - 36.0 g/dL   RDW 40.9 81.1 - 91.4 %   Platelets 223 150 - 400 K/uL   nRBC 0.0 0.0 - 0.2 %  Comprehensive metabolic panel     Status: Abnormal   Collection Time: 01/07/23 11:56 AM  Result Value Ref Range   Sodium 134 (L) 135 - 145 mmol/L   Potassium 3.9 3.5 - 5.1 mmol/L   Chloride 106 98 - 111 mmol/L   CO2 18 (L) 22 - 32 mmol/L   Glucose, Bld 284 (H) 70 - 99 mg/dL   BUN 17 6 - 20 mg/dL   Creatinine, Ser 7.82 (H) 0.44 - 1.00 mg/dL   Calcium 9.5 8.9 - 95.6 mg/dL   Total Protein 6.5 6.5 - 8.1 g/dL   Albumin 2.8 (L) 3.5 - 5.0 g/dL   AST 20  15 - 41 U/L   ALT 19 0 - 44 U/L   Alkaline Phosphatase 100 38 - 126 U/L   Total Bilirubin 0.6 <1.2 mg/dL   GFR, Estimated 57 (L) >60 mL/min   Anion gap 10 5 - 15     *Consult  with Dr. Despina Hidden @ 1504 - notified of patient's complaints, assessments, lab & NST results, recommended tx plan d/c home with PEC precautions and F/U in office in 2-3 days for recheck, ok to change Procardia to BID - ok to d/c home with chronic proteinuria  Assessment and Plan  1. Chronic hypertension complicating or reason for care during pregnancy, third trimester (Primary) - prescription for: Procardia 30 mg po BID - Information provided on HTN in pregnancy   2. Other proteinuria (chronic)  3. [redacted] weeks gestation of pregnancy   - Discharge patient - Schedule appt to be seen in [facility] in 2-3 days for recheck BP and labs. Msg sent to [facility] to get patient scheduled - Patient verbalized an understanding of the plan of care and agrees.   Raelyn Mora, CNM 01/07/2023, 11:53 AM

## 2023-01-11 ENCOUNTER — Other Ambulatory Visit: Payer: Self-pay

## 2023-01-11 ENCOUNTER — Encounter (HOSPITAL_COMMUNITY): Payer: Self-pay | Admitting: Obstetrics & Gynecology

## 2023-01-11 ENCOUNTER — Ambulatory Visit: Payer: Self-pay

## 2023-01-11 ENCOUNTER — Inpatient Hospital Stay (HOSPITAL_COMMUNITY): Payer: Self-pay

## 2023-01-11 ENCOUNTER — Inpatient Hospital Stay (HOSPITAL_COMMUNITY)
Admission: RE | Admit: 2023-01-11 | Discharge: 2023-01-13 | DRG: 832 | Disposition: A | Discharge status: Home / Self Care | Payer: Self-pay | Attending: Obstetrics & Gynecology | Admitting: Obstetrics & Gynecology

## 2023-01-11 VITALS — BP 154/77 | HR 82 | Wt 196.0 lb

## 2023-01-11 DIAGNOSIS — O09523 Supervision of elderly multigravida, third trimester: Secondary | ICD-10-CM

## 2023-01-11 DIAGNOSIS — O10913 Unspecified pre-existing hypertension complicating pregnancy, third trimester: Secondary | ICD-10-CM

## 2023-01-11 DIAGNOSIS — O10013 Pre-existing essential hypertension complicating pregnancy, third trimester: Secondary | ICD-10-CM

## 2023-01-11 DIAGNOSIS — Z5986 Financial insecurity: Secondary | ICD-10-CM

## 2023-01-11 DIAGNOSIS — E119 Type 2 diabetes mellitus without complications: Secondary | ICD-10-CM | POA: Diagnosis present

## 2023-01-11 DIAGNOSIS — O1213 Gestational proteinuria, third trimester: Secondary | ICD-10-CM

## 2023-01-11 DIAGNOSIS — O10919 Unspecified pre-existing hypertension complicating pregnancy, unspecified trimester: Principal | ICD-10-CM | POA: Diagnosis present

## 2023-01-11 DIAGNOSIS — O24113 Pre-existing diabetes mellitus, type 2, in pregnancy, third trimester: Secondary | ICD-10-CM | POA: Diagnosis present

## 2023-01-11 DIAGNOSIS — Z3A32 32 weeks gestation of pregnancy: Secondary | ICD-10-CM

## 2023-01-11 DIAGNOSIS — E1169 Type 2 diabetes mellitus with other specified complication: Secondary | ICD-10-CM

## 2023-01-11 DIAGNOSIS — Z794 Long term (current) use of insulin: Secondary | ICD-10-CM

## 2023-01-11 DIAGNOSIS — Z79890 Hormone replacement therapy (postmenopausal): Secondary | ICD-10-CM

## 2023-01-11 DIAGNOSIS — Z87442 Personal history of urinary calculi: Secondary | ICD-10-CM

## 2023-01-11 DIAGNOSIS — Z85850 Personal history of malignant neoplasm of thyroid: Secondary | ICD-10-CM

## 2023-01-11 DIAGNOSIS — Z7982 Long term (current) use of aspirin: Secondary | ICD-10-CM

## 2023-01-11 DIAGNOSIS — E1142 Type 2 diabetes mellitus with diabetic polyneuropathy: Secondary | ICD-10-CM

## 2023-01-11 LAB — PROTEIN / CREATININE RATIO, URINE
Creatinine, Urine: 29 mg/dL
Protein Creatinine Ratio: 6.14 mg/mg{creat} — ABNORMAL HIGH (ref 0.00–0.15)
Total Protein, Urine: 178 mg/dL

## 2023-01-11 LAB — CBC
HCT: 32.5 % — ABNORMAL LOW (ref 36.0–46.0)
Hemoglobin: 11.1 g/dL — ABNORMAL LOW (ref 12.0–15.0)
MCH: 31.4 pg (ref 26.0–34.0)
MCHC: 34.2 g/dL (ref 30.0–36.0)
MCV: 92.1 fL (ref 80.0–100.0)
Platelets: 194 10*3/uL (ref 150–400)
RBC: 3.53 MIL/uL — ABNORMAL LOW (ref 3.87–5.11)
RDW: 12.5 % (ref 11.5–15.5)
WBC: 9.6 10*3/uL (ref 4.0–10.5)
nRBC: 0 % (ref 0.0–0.2)

## 2023-01-11 LAB — COMPREHENSIVE METABOLIC PANEL
ALT: 17 U/L (ref 0–44)
AST: 23 U/L (ref 15–41)
Albumin: 2.6 g/dL — ABNORMAL LOW (ref 3.5–5.0)
Alkaline Phosphatase: 106 U/L (ref 38–126)
Anion gap: 7 (ref 5–15)
BUN: 14 mg/dL (ref 6–20)
CO2: 19 mmol/L — ABNORMAL LOW (ref 22–32)
Calcium: 9 mg/dL (ref 8.9–10.3)
Chloride: 108 mmol/L (ref 98–111)
Creatinine, Ser: 1.12 mg/dL — ABNORMAL HIGH (ref 0.44–1.00)
GFR, Estimated: >60 mL/min (ref >60)
Glucose, Bld: 345 mg/dL — ABNORMAL HIGH (ref 70–99)
Potassium: 4.6 mmol/L (ref 3.5–5.1)
Sodium: 134 mmol/L — ABNORMAL LOW (ref 135–145)
Total Bilirubin: 0.6 mg/dL (ref <1.2)
Total Protein: 6.2 g/dL — ABNORMAL LOW (ref 6.5–8.1)

## 2023-01-11 LAB — TYPE AND SCREEN
ABO/RH(D): O POS
Antibody Screen: NEGATIVE

## 2023-01-11 LAB — GLUCOSE, CAPILLARY
Glucose-Capillary: 265 mg/dL — ABNORMAL HIGH (ref 70–99)
Glucose-Capillary: 204 mg/dL — ABNORMAL HIGH (ref 70–99)
Glucose-Capillary: 191 mg/dL — ABNORMAL HIGH (ref 70–99)

## 2023-01-11 MED ORDER — ACETAMINOPHEN 325 MG PO TABS: 650.0000 mg | ORAL_TABLET | ORAL | Status: DC | PRN

## 2023-01-11 MED ORDER — NIFEDIPINE ER OSMOTIC RELEASE 30 MG PO TB24
30.0000 mg | ORAL_TABLET | ORAL | Status: AC
  Administered 2023-01-11: 30 mg via ORAL
  Filled 2023-01-11: qty 1

## 2023-01-11 MED ORDER — LABETALOL HCL 5 MG/ML IV SOLN
80.0000 mg | INTRAVENOUS | Status: DC | PRN
Start: 2023-01-11 — End: 2023-01-13

## 2023-01-11 MED ORDER — INSULIN ASPART 100 UNIT/ML IJ SOLN
0.0000 [IU] | Freq: Three times a day (TID) | INTRAMUSCULAR | Status: DC
  Administered 2023-01-11: 8 [IU] via SUBCUTANEOUS

## 2023-01-11 MED ORDER — NIFEDIPINE ER 90 MG PO TB24
90.0000 mg | ORAL_TABLET | Freq: Every day | ORAL | 0 refills | Status: DC
  Filled 2023-01-11: qty 30, 30d supply, fill #0

## 2023-01-11 MED ORDER — ASPIRIN 81 MG PO CHEW
81.0000 mg | CHEWABLE_TABLET | Freq: Every day | ORAL | Status: DC
  Administered 2023-01-11 – 2023-01-13 (×3): 81 mg via ORAL
  Filled 2023-01-11 (×3): qty 1

## 2023-01-11 MED ORDER — PRENATAL MULTIVITAMIN CH
1.0000 | ORAL_TABLET | Freq: Every day | ORAL | Status: DC
  Administered 2023-01-11 – 2023-01-13 (×3): 1 via ORAL
  Filled 2023-01-11 (×3): qty 1

## 2023-01-11 MED ORDER — LABETALOL HCL 5 MG/ML IV SOLN
40.0000 mg | INTRAVENOUS | Status: DC | PRN
Start: 2023-01-11 — End: 2023-01-13
  Administered 2023-01-11: 40 mg via INTRAVENOUS
  Filled 2023-01-11: qty 8

## 2023-01-11 MED ORDER — HYDRALAZINE HCL 20 MG/ML IJ SOLN: 10.0000 mg | INTRAMUSCULAR | Status: DC | PRN

## 2023-01-11 MED ORDER — PRENATAL MULTIVITAMIN CH: 1.0000 | ORAL_TABLET | Freq: Every day | ORAL | Status: DC

## 2023-01-11 MED ORDER — INSULIN GLARGINE-YFGN 100 UNIT/ML ~~LOC~~ SOLN
16.0000 [IU] | Freq: Every day | SUBCUTANEOUS | Status: DC
Start: 2023-01-11 — End: 2023-01-13
  Administered 2023-01-11 – 2023-01-12 (×2): 16 [IU] via SUBCUTANEOUS
  Filled 2023-01-11 (×3): qty 0.16

## 2023-01-11 MED ORDER — BETAMETHASONE SOD PHOS & ACET 6 (3-3) MG/ML IJ SUSP
12.0000 mg | INTRAMUSCULAR | Status: AC
  Administered 2023-01-11 – 2023-01-12 (×2): 12 mg via INTRAMUSCULAR
  Filled 2023-01-11: qty 5

## 2023-01-11 MED ORDER — SODIUM CHLORIDE 0.9% FLUSH
3.0000 mL | Freq: Two times a day (BID) | INTRAVENOUS | Status: DC
  Administered 2023-01-11 – 2023-01-13 (×5): 3 mL via INTRAVENOUS

## 2023-01-11 MED ORDER — LEVOTHYROXINE SODIUM 25 MCG PO TABS
125.0000 ug | ORAL_TABLET | Freq: Every day | ORAL | Status: DC
  Administered 2023-01-12 – 2023-01-13 (×2): 125 ug via ORAL
  Filled 2023-01-11 (×2): qty 5

## 2023-01-11 MED ORDER — SODIUM CHLORIDE 0.9 % IV SOLN: 250.0000 mL | INTRAVENOUS | Status: AC | PRN

## 2023-01-11 MED ORDER — LABETALOL HCL 5 MG/ML IV SOLN
20.0000 mg | INTRAVENOUS | Status: DC | PRN
  Administered 2023-01-11: 20 mg via INTRAVENOUS
  Filled 2023-01-11: qty 4

## 2023-01-11 MED ORDER — DOCUSATE SODIUM 100 MG PO CAPS
100.0000 mg | ORAL_CAPSULE | Freq: Every day | ORAL | Status: DC
  Administered 2023-01-11 – 2023-01-13 (×3): 100 mg via ORAL
  Filled 2023-01-11 (×3): qty 1

## 2023-01-11 MED ORDER — INSULIN LISPRO (1 UNIT DIAL) 100 UNIT/ML (KWIKPEN)
14.0000 [IU] | PEN_INJECTOR | Freq: Three times a day (TID) | SUBCUTANEOUS | Status: DC
  Filled 2023-01-11: qty 0.14
  Filled 2023-01-11: qty 3

## 2023-01-11 MED ORDER — ZOLPIDEM TARTRATE 5 MG PO TABS: 5.0000 mg | ORAL_TABLET | Freq: Every evening | ORAL | Status: DC | PRN

## 2023-01-11 MED ORDER — CALCIUM CARBONATE ANTACID 500 MG PO CHEW: 2.0000 | CHEWABLE_TABLET | ORAL | Status: DC | PRN

## 2023-01-11 MED ORDER — INSULIN GLARGINE-YFGN 100 UNIT/ML ~~LOC~~ SOLN
14.0000 [IU] | Freq: Every day | SUBCUTANEOUS | Status: DC
Start: 2023-01-11 — End: 2023-01-11
  Filled 2023-01-11: qty 0.14

## 2023-01-11 MED ORDER — INSULIN ASPART 100 UNIT/ML IJ SOLN
16.0000 [IU] | Freq: Three times a day (TID) | INTRAMUSCULAR | Status: DC
  Administered 2023-01-11 – 2023-01-12 (×2): 16 [IU] via SUBCUTANEOUS

## 2023-01-11 MED ORDER — SODIUM CHLORIDE 0.9% FLUSH: 3.0000 mL | INTRAVENOUS | Status: DC | PRN

## 2023-01-11 MED ORDER — NIFEDIPINE ER OSMOTIC RELEASE 60 MG PO TB24
90.0000 mg | ORAL_TABLET | Freq: Every day | ORAL | Status: DC
  Administered 2023-01-12 – 2023-01-13 (×2): 90 mg via ORAL
  Filled 2023-01-11 (×2): qty 1

## 2023-01-11 NOTE — Inpatient Diabetes Management (Signed)
Inpatient Diabetes Program Recommendations  AACE/ADA: New Consensus Statement on Inpatient Glycemic Control (2024) Diabetes in Pregnancy Target Glucose Ranges:  Fasting: 70 - 95 mg/dL 1 hr postprandial:  347 - 140mg /dL (from first bite of meal) 2 hr postprandial:  100 - 120 mg/dL (from first bit of meal)    Lab Results  Component Value Date   GLUCAP 104 (H) 12/09/2020   HGBA1C 7.3 (H) 08/28/2022     Diabetes history: Type 2 DM Outpatient Diabetes medications:  Lantus 14 units q HS Humalog 14 units tid with meals Current orders for Inpatient glycemic control:  Semglee 14 units q HS Humalog 14 units tid with meals Betamethasone 12 mg IM x 2  Inpatient Diabetes Program Recommendations:    Current CBG=265 mg/dL.  MD notified and orders for correction insulin being placed.  If blood sugars remain> goal, may need IV insulin for the next 24-48 hours due to betamethasone.    Thanks,  Lorenza Cambridge, RN, BC-ADM Inpatient Diabetes Coordinator Pager [phone]  (8a-5p)

## 2023-01-11 NOTE — Progress Notes (Signed)
Blood Pressure Check Visit  Audrey Mcmillan is here for blood pressure check following elevated BP during prenatal visit on 12/16 and was sent to MAU same day. Patient reports taking prescribed BP medications and was told in the MAU to take 120mg  procardia once a day (60mg  AM 60mg  PM).  BP today is 162/77. Repeat BP is 154/77.  Patient denies any visual disturbances and/or headaches. Upon review with physician, advised patient to go to the MAU now for further work up and monitoring. RN informed patient and advised the importance of going to the MAU. Patient verbalized that she will go to the MAU.      Quintella Reichert, RN 01/11/2023  9:55 AM

## 2023-01-11 NOTE — H&P (Signed)
Faculty Practice Antenatal History and Physical  Audrey Mcmillan EXB:[id number] DOB: [birth year] DOA: 01/11/2023  Chief Complaint: elevated blood pressure  HPI: Audrey Mcmillan is a 42 y.o. female [phone] with IUP at [redacted]w[redacted]d presenting for evaluation of elevated blood pressure with increasing requirement for antihypertensives to manage CHTN. She was seen at [facility] today and was told to come for admission. She is compliant with her medication.   Review of Systems:   Pt complains of mild headache no visual changes.  Leg swelling mild  Prenatal History/Complications: No diagnosis found.   Past Medical History: Past Medical History:  Diagnosis Date   Diabetes mellitus without complication (HCC)    type 2   Gestational hypertension, third trimester 11/14/2020   High cholesterol    Hyperthyroidism    Kidney stone complicating pregnancy, first trimester 06/06/2020   Marginal insertion of umbilical cord affecting management of mother 08/14/2020   Thyroid cancer [facility])     Past Surgical History: Past Surgical History:  Procedure Laterality Date   NO PAST SURGERIES      Obstetrical History: OB History     Gravida  3   Para  2   Term  1   Preterm  1   AB  0   Living  2      SAB  0   IAB  0   Ectopic  0   Multiple  0   Live Births  2            Social History: Social History   Socioeconomic History   Marital status: Single    Spouse name: Not on file   Number of children: Not on file   Years of education: Not on file   Highest education level: 12th grade  Occupational History   Not on file  Tobacco Use   Smoking status: Never   Smokeless tobacco: Never  Vaping Use   Vaping status: Never Used  Substance and Sexual Activity   Alcohol use: No    Comment: occ   Drug use: No   Sexual activity: Yes    Birth control/protection: None  Other Topics Concern   Not on file  Social History Narrative   ** Merged History Encounter **        Social Drivers of Health   Financial Resource Strain: Medium Risk (06/18/2022)   Overall Financial Resource Strain (CARDIA)    Difficulty of Paying Living Expenses: Somewhat hard  Food Insecurity: No Food Insecurity (07/18/2022)   Hunger Vital Sign    Worried About Running Out of Food in the Last Year: Never true    Ran Out of Food in the Last Year: Never true  Recent Concern: Food Insecurity - Food Insecurity Present (06/18/2022)   Hunger Vital Sign    Worried About Running Out of Food in the Last Year: Sometimes true    Ran Out of Food in the Last Year: Sometimes true  Transportation Needs: No Transportation Needs (07/18/2022)   PRAPARE - Administrator, Civil Service (Medical): No    Lack of Transportation (Non-Medical): No  Physical Activity: Insufficiently Active (06/18/2022)   Exercise Vital Sign    Days of Exercise per Week: 4 days    Minutes of Exercise per Session: 30 min  Stress: No Stress Concern Present (06/18/2022)   [employer] of Occupational Health - Occupational Stress Questionnaire    Feeling of Stress : Not at all  Social Connections: Unknown (06/18/2022)   Social Connection  and Isolation Panel [NHANES]    Frequency of Communication with Friends and Family: Patient declined    Frequency of Social Gatherings with Friends and Family: More than three times a week    Attends Religious Services: Patient declined    Database administrator or Organizations: No    Attends Engineer, structural: Not on file    Marital Status: Separated    Family History: Family History  Family history unknown: Yes    Allergies: No Known Allergies  Medications Prior to Admission  Medication Sig Dispense Refill Last Dose/Taking   aspirin 81 MG chewable tablet Chew 1 tablet (81 mg total) by mouth daily. 90 tablet 6    Blood Glucose Monitoring Suppl (TRUE METRIX METER) w/Device KIT Use as instructed 1 kit 0    glucose blood (TRUE METRIX BLOOD GLUCOSE TEST)  test strip Use as instructed. Check blood glucose levels twice per day. 100 each 12    insulin glargine (LANTUS) 100 UNIT/ML Solostar Pen Inject 14 Units into the skin at bedtime. 15 mL 5    insulin lispro (HUMALOG) 100 UNIT/ML KwikPen Inject 14 Units into the skin 3 (three) times daily with meals. 15 mL 11    Insulin Pen Needle (TECHLITE PEN NEEDLES) 32G X 4 MM MISC Use to inject insulin up to 4 times daily as directed. 100 each 3    Insulin Pen Needle 32G X 4 MM MISC Use three times daily with Humalog. 100 each 11    Insulin Syringe-Needle U-100 30G X 5/16" 1 ML MISC Inject 1 application into the skin 2 times daily at 12 noon and 4 pm. 100 each 5    levothyroxine (SYNTHROID) 125 MCG tablet Take 1 tablet (125 mcg total) by mouth daily before breakfast. 42 tablet 1    Prenatal Vit-Fe Fumarate-FA (PREPLUS) 27-1 MG TABS Take 1 tablet by mouth daily. 60 tablet 2    TRUEplus Lancets 28G MISC Use as instructed. Check blood glucose levels twice per day. 100 each 3     Physical Exam: BP (!) 178/75 (BP Location: Right Arm)   Pulse 94   Temp 97.9 F (36.6 C) (Oral)   Resp (!) 22   LMP  (LMP Unknown)   SpO2 100%   General appearance: alert, cooperative, and no distress Head: Normocephalic, without obvious abnormality, atraumatic Lungs:  normal effort Abdomen:  gravid c/w dates Extremities: edema 1+ below knees Skin: Skin color, texture, turgor normal. No rashes or lesions unsure Baseline: 145 bpm, Variability: Good {> 6 bpm), Accelerations: Reactive, and Decelerations: Absent None             Labs on Admission:  Basic Metabolic Panel: Recent Labs  Lab 01/07/23 1156  NA 134*  K 3.9  CL 106  CO2 18*  GLUCOSE 284*  BUN 17  CREATININE 1.23*  CALCIUM 9.5   Liver Function Tests: Recent Labs  Lab 01/07/23 1156  AST 20  ALT 19  ALKPHOS 100  BILITOT 0.6  PROT 6.5  ALBUMIN 2.8*   No results for input(s): "LIPASE", "AMYLASE" in the last 168 hours. No results for input(s):  "AMMONIA" in the last 168 hours. CBC: Recent Labs  Lab 01/07/23 1156  WBC 10.5  HGB 11.9*  HCT 34.4*  MCV 89.1  PLT 223    CBG: No results for input(s): "GLUCAP" in the last 168 hours.  Radiological Exams on Admission: No results found.   Assessment/Plan Present on Admission:  Chronic hypertension during pregnancy, antepartum  Admission  for treatment and evaluation for possible superimposed preeclampsia.  Diabetes management Fetal growth ultrasound Betamethasone x2  Code Status: full   Disposition Plan: admission  Time spent: 88  Adam Phenix, MD 01/11/2023 2:15 PM Faculty Practice Attending Physician [facility] Attending Phone #: [phone]  **Disclaimer: This note may have been dictated with voice recognition software. Similar sounding words can inadvertently be transcribed and this note may contain transcription errors which may not have been corrected upon publication of note.**

## 2023-01-12 DIAGNOSIS — Z3A32 32 weeks gestation of pregnancy: Secondary | ICD-10-CM

## 2023-01-12 DIAGNOSIS — O10913 Unspecified pre-existing hypertension complicating pregnancy, third trimester: Principal | ICD-10-CM

## 2023-01-12 LAB — GLUCOSE, CAPILLARY
Glucose-Capillary: 130 mg/dL — ABNORMAL HIGH (ref 70–99)
Glucose-Capillary: 187 mg/dL — ABNORMAL HIGH (ref 70–99)
Glucose-Capillary: 143 mg/dL — ABNORMAL HIGH (ref 70–99)
Glucose-Capillary: 203 mg/dL — ABNORMAL HIGH (ref 70–99)
Glucose-Capillary: 288 mg/dL — ABNORMAL HIGH (ref 70–99)
Glucose-Capillary: 233 mg/dL — ABNORMAL HIGH (ref 70–99)

## 2023-01-12 LAB — RPR: RPR Ser Ql: NONREACTIVE

## 2023-01-12 MED ORDER — LABETALOL HCL 200 MG PO TABS
200.0000 mg | ORAL_TABLET | Freq: Three times a day (TID) | ORAL | Status: DC
Start: 2023-01-12 — End: 2023-01-13
  Administered 2023-01-12 – 2023-01-13 (×4): 200 mg via ORAL
  Filled 2023-01-12 (×4): qty 1

## 2023-01-12 MED ORDER — INSULIN ASPART 100 UNIT/ML IJ SOLN
6.0000 [IU] | Freq: Once | INTRAMUSCULAR | Status: AC
Start: 2023-01-12 — End: 2023-01-12
  Administered 2023-01-12: 6 [IU] via SUBCUTANEOUS

## 2023-01-12 MED ORDER — INSULIN ASPART 100 UNIT/ML IJ SOLN
0.0000 [IU] | INTRAMUSCULAR | Status: DC
  Administered 2023-01-12: 6 [IU] via SUBCUTANEOUS
  Administered 2023-01-13: 4 [IU] via SUBCUTANEOUS
  Administered 2023-01-13: 3 [IU] via SUBCUTANEOUS

## 2023-01-12 MED ORDER — INSULIN ASPART 100 UNIT/ML IJ SOLN
20.0000 [IU] | Freq: Three times a day (TID) | INTRAMUSCULAR | Status: DC
  Administered 2023-01-12 (×2): 20 [IU] via SUBCUTANEOUS

## 2023-01-12 MED ORDER — POLYETHYLENE GLYCOL 3350 17 G PO PACK
17.0000 g | PACK | Freq: Every day | ORAL | Status: DC
  Administered 2023-01-12 – 2023-01-13 (×2): 17 g via ORAL
  Filled 2023-01-12 (×2): qty 1

## 2023-01-12 NOTE — Inpatient Diabetes Management (Signed)
Inpatient Diabetes Program Recommendations  ADA Standards of Care 2024 Diabetes in Pregnancy Target Glucose Ranges:  Fasting: 70 - 95 mg/dL 1 hr postprandial:  409 - 140mg /dL (from first bite of meal) 2 hr postprandial:  100 - 120 mg/dL (from first bit of meal)    Lab Results  Component Value Date   GLUCAP 187 (H) 01/12/2023   HGBA1C 7.3 (H) 08/28/2022     Diabetes history: Type 2 DM Outpatient Diabetes medications:  Lantus 14 units q HS Humalog 14 units tid with meals Current orders for Inpatient glycemic control:  Semglee 16 units q HS Humalog 16 units tid with meals Betamethasone 12 mg IM x 2  Inpatient Diabetes Program Recommendations:    -  Add Novolog correction scale 0-14 units tid  Thanks,  Christena Deem RN, MSN, BC-ADM Inpatient Diabetes Coordinator Team Pager [phone] (8a-5p)

## 2023-01-12 NOTE — Progress Notes (Signed)
Patient ID: Audrey Mcmillan, female   DOB: [birth year], 42 y.o.   MRN: [id number] FACULTY PRACTICE ANTEPARTUM(COMPREHENSIVE) NOTE  Audrey Mcmillan is a 42 y.o. [phone] with Estimated Date of Delivery: 03/06/23   By  early ultrasound [redacted]w[redacted]d  who is admitted for BP management.    Fetal presentation is cephalic. Length of Stay:  1  Days  Date of admission:01/11/2023  Subjective: Pt without complaints Patient reports the fetal movement as active. Patient reports uterine contraction  activity as none. Patient reports  vaginal bleeding as none. Patient describes fluid per vagina as None.  Vitals:  Blood pressure (!) 159/69, pulse 89, temperature 98.3 F (36.8 C), temperature source Oral, resp. rate 18, height 5\' 2"  (1.575 m), weight 88.9 kg, SpO2 100%. Vitals:   01/12/23 0310 01/12/23 0742 01/12/23 1123 01/12/23 1447  BP: (!) 155/68 (!) 142/69 (!) 155/71 (!) 159/69  Pulse: 75 86 83 89  Resp: 16 17 17 18   Temp: 98.3 F (36.8 C) 98.6 F (37 C) 98.6 F (37 C) 98.3 F (36.8 C)  TempSrc: Oral Oral Oral Oral  SpO2: 100% 98% 98% 100%  Weight:      Height:       Physical Examination:  General appearance - alert, well appearing, and in no distress Abdomen - soft, nontender, nondistended, no masses or organomegaly Fundal Height:  size equals dates Pelvic Exam:  normal external genitalia, vulva, vagina, cervix, uterus and adnexa Cervical Exam: Not evaluated. Extremities: extremities normal, atraumatic, no cyanosis or edema with DTRs 2+ bilaterally Membranes:intact  Fetal Monitoring:  Baseline: 130s bpm, Variability: Good {> 6 bpm), Accelerations: Reactive, and Decelerations: Absent   reactive  Labs:  Results for orders placed or performed during the hospital encounter of 01/11/23 (from the past 24 hours)  Glucose, capillary   Collection Time: 01/11/23  8:15 PM  Result Value Ref Range   Glucose-Capillary 204 (H) 70 - 99 mg/dL  Glucose, capillary   Collection Time: 01/11/23  10:25 PM  Result Value Ref Range   Glucose-Capillary 191 (H) 70 - 99 mg/dL  Glucose, capillary   Collection Time: 01/12/23  6:15 AM  Result Value Ref Range   Glucose-Capillary 130 (H) 70 - 99 mg/dL  Glucose, capillary   Collection Time: 01/12/23 11:07 AM  Result Value Ref Range   Glucose-Capillary 187 (H) 70 - 99 mg/dL  Glucose, capillary   Collection Time: 01/12/23  2:27 PM  Result Value Ref Range   Glucose-Capillary 143 (H) 70 - 99 mg/dL    Imaging Studies:    [place] MFM OB COMP + 14 WK Result Date: 01/12/2023 ----------------------------------------------------------------------  OBSTETRICS REPORT                        (Signed Final 01/12/2023 08:49 am) ---------------------------------------------------------------------- Patient Info  ID #:       [id number]                          D.O.B.:  1980-05-15 (42 yrs)(F)  Name:       Audrey Mcmillan                 Visit Date: 01/11/2023 05:40 pm              Audrey Mcmillan ---------------------------------------------------------------------- Performed By  Attending:        Noralee Space MD        Ref. Address:      [address]  [place], [place]                                                              [address]  Performed By:     Audrey Mcmillan        [facility]  Referred By:      [facility]-                    Faculty Physician ---------------------------------------------------------------------- Orders  #  Description                           Code        Ordered By  1  [place] MFM OB COMP + 14 WK                76805.01    Audrey Mcmillan ----------------------------------------------------------------------  #  Order #                     Accession #                Episode #  1  [id number]                    [id number]                 [id number] ---------------------------------------------------------------------- Indications  [redacted] weeks gestation of pregnancy                 Z3A.32  Pre-existing diabetes, type 2, in pregnancy,    O24.113  third trimester  Advanced maternal age multigravida 55+,         O88.523  third trimester  Proteinuria in pregnancy, antepartum            O12.10  Severe preeclampsia, third trimester            O14.13  Hypertension - Chronic/Pre-existing             O10.019 ---------------------------------------------------------------------- Fetal Evaluation  Num Of Fetuses:          1  Fetal Heart Rate(bpm):   132  Cardiac Activity:        Observed  Presentation:            Cephalic  Placenta:                Posterior  P. Cord Insertion:       Not well visualized  Amniotic Fluid  AFI FV:      Within normal limits  AFI Sum(cm)     %Tile       Largest Pocket(cm)  16.6            60          5.7  RUQ(cm)       RLQ(cm)       LUQ(cm)        LLQ(cm)  5.7           2             4.8            4.1 ---------------------------------------------------------------------- Biometry  BPD:        78  mm     G. Age:  31w 2d         16  %    CI:        75.57   %    70 - 86                                                          FL/HC:       21.7  %    19.1 - 21.3  HC:      284.5  mm     G. Age:  31w 2d        3.1  %    HC/AC:       0.99       0.96 - 1.17  AC:       286   mm     G. Age:  32w 4d         60  %    FL/BPD:      79.2  %    71 - 87  FL:       61.8  mm     G. Age:  32w 0d         31  %    FL/AC:       21.6  %    20 - 24  CER:      33.1  mm     G. Age:  28w 1d      < 2.3  %  Est. FW:    1921   gm     4 lb 4 oz     36  % ---------------------------------------------------------------------- OB History  Blood Type:   O+  Gravidity:    3         Term:   1        Prem:   1        SAB:   0  TOP:          0       Ectopic:  0        Living: 2 ----------------------------------------------------------------------  Gestational Age  U/S Today:     31w 6d                                        EDD:   03/09/23  Best:  32w 2d     Det. ByMarcella Dubs         EDD:   03/06/23                                      (07/11/22) ---------------------------------------------------------------------- Anatomy  Cranium:               Appears normal         Aortic Arch:            Not well visualized  Cavum:                 Not well visualized    Ductal Arch:            Not well visualized  Ventricles:            Not well visualized    Diaphragm:              Appears normal  Choroid Plexus:        Not well visualized    Stomach:                Appears normal, left                                                                        sided  Cerebellum:            Appears normal         Abdomen:                Appears normal  Posterior Fossa:       Appears normal         Abdominal Wall:         Appears nml (cord                                                                        insert, abd wall)  Nuchal Fold:           Not applicable (< 16   Cord Vessels:           Appears normal ([redacted]                         wks GA)                                        vessel cord)  Lips:                  Appears normal         Kidneys:                Appear normal  Palate:                Not  well visualized    Bladder:                Appears normal  Thoracic:              Appears normal         Spine:                  Appears normal  Heart:                 Appears normal         Upper Extremities:      Appears normal                         (4CH, axis, and                         situs)  RVOT:                  Not well visualized    Lower Extremities:      Appears normal  LVOT:                  Not well visualized ---------------------------------------------------------------------- Impression  [id number]. Patient was admitted with severe hypertension and  has possible superimposed preeclampsia and diabetic  nephropathy.  She has type 2 diabetes.   Fetal growth is appropriate for gestational age. Amniotic fluid  is normal and good fetal activity is seen. Fetal anatomical  survey is limited because of advanced gestational age. ---------------------------------------------------------------------- Recommendations  -Recommend BPP and then twice-weekly BPP. ----------------------------------------------------------------------                  Audrey Space, MD Electronically Signed Final Report   01/12/2023 08:49 am ----------------------------------------------------------------------     Medications:  Scheduled  aspirin  81 mg Oral Daily   docusate sodium  100 mg Oral Daily   insulin aspart  20 Units Subcutaneous TID WC   insulin glargine-yfgn  16 Units Subcutaneous QHS   labetalol  200 mg Oral Q8H   levothyroxine  125 mcg Oral QAC breakfast   NIFEdipine  90 mg Oral Daily   prenatal multivitamin  1 tablet Oral Q1200   sodium chloride flush  3 mL Intravenous Q12H   I have reviewed the patient's current medications.  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

## 2023-01-13 ENCOUNTER — Other Ambulatory Visit: Payer: Self-pay | Admitting: Obstetrics & Gynecology

## 2023-01-13 LAB — COMPREHENSIVE METABOLIC PANEL
ALT: 21 U/L (ref 0–44)
AST: 18 U/L (ref 15–41)
Albumin: 2.4 g/dL — ABNORMAL LOW (ref 3.5–5.0)
Alkaline Phosphatase: 93 U/L (ref 38–126)
Anion gap: 8 (ref 5–15)
BUN: 22 mg/dL — ABNORMAL HIGH (ref 6–20)
CO2: 16 mmol/L — ABNORMAL LOW (ref 22–32)
Calcium: 8.9 mg/dL (ref 8.9–10.3)
Chloride: 109 mmol/L (ref 98–111)
Creatinine, Ser: 1.12 mg/dL — ABNORMAL HIGH (ref 0.44–1.00)
GFR, Estimated: >60 mL/min (ref >60)
Glucose, Bld: 148 mg/dL — ABNORMAL HIGH (ref 70–99)
Potassium: 4 mmol/L (ref 3.5–5.1)
Sodium: 133 mmol/L — ABNORMAL LOW (ref 135–145)
Total Bilirubin: 0.3 mg/dL (ref <1.2)
Total Protein: 5.8 g/dL — ABNORMAL LOW (ref 6.5–8.1)

## 2023-01-13 LAB — CBC
HCT: 31.4 % — ABNORMAL LOW (ref 36.0–46.0)
Hemoglobin: 10.7 g/dL — ABNORMAL LOW (ref 12.0–15.0)
MCH: 30.9 pg (ref 26.0–34.0)
MCHC: 34.1 g/dL (ref 30.0–36.0)
MCV: 90.8 fL (ref 80.0–100.0)
Platelets: 204 10*3/uL (ref 150–400)
RBC: 3.46 MIL/uL — ABNORMAL LOW (ref 3.87–5.11)
RDW: 12.6 % (ref 11.5–15.5)
WBC: 12.1 10*3/uL — ABNORMAL HIGH (ref 4.0–10.5)
nRBC: 0 % (ref 0.0–0.2)

## 2023-01-13 LAB — GLUCOSE, CAPILLARY
Glucose-Capillary: 176 mg/dL — ABNORMAL HIGH (ref 70–99)
Glucose-Capillary: 150 mg/dL — ABNORMAL HIGH (ref 70–99)
Glucose-Capillary: 163 mg/dL — ABNORMAL HIGH (ref 70–99)
Glucose-Capillary: 96 mg/dL (ref 70–99)

## 2023-01-13 LAB — CULTURE, BETA STREP (GROUP B ONLY)

## 2023-01-13 MED ORDER — INSULIN GLARGINE-YFGN 100 UNIT/ML ~~LOC~~ SOLN
20.0000 [IU] | Freq: Every day | SUBCUTANEOUS | Status: DC
  Filled 2023-01-13: qty 0.2

## 2023-01-13 MED ORDER — INSULIN ASPART 100 UNIT/ML IJ SOLN
0.0000 [IU] | INTRAMUSCULAR | Status: DC
  Administered 2023-01-13: 2 [IU] via SUBCUTANEOUS
  Administered 2023-01-13: 4 [IU] via SUBCUTANEOUS

## 2023-01-13 MED ORDER — NIFEDIPINE ER OSMOTIC RELEASE 90 MG PO TB24
90.0000 mg | ORAL_TABLET | Freq: Every day | ORAL | 1 refills | Status: DC
  Filled 2023-01-13: qty 30, 30d supply, fill #0

## 2023-01-13 MED ORDER — INSULIN ASPART 100 UNIT/ML IJ SOLN
24.0000 [IU] | Freq: Three times a day (TID) | INTRAMUSCULAR | Status: DC
  Administered 2023-01-13 (×2): 24 [IU] via SUBCUTANEOUS

## 2023-01-13 MED ORDER — LABETALOL HCL 200 MG PO TABS
200.0000 mg | ORAL_TABLET | Freq: Three times a day (TID) | ORAL | 1 refills | Status: DC
  Filled 2023-01-13: qty 90, 30d supply, fill #0

## 2023-01-13 MED ORDER — LABETALOL HCL 200 MG PO TABS: 200.0000 mg | ORAL_TABLET | Freq: Three times a day (TID) | ORAL | 0 refills | Status: DC

## 2023-01-13 MED ORDER — INSULIN GLARGINE 100 UNIT/ML SOLOSTAR PEN
24.0000 [IU] | PEN_INJECTOR | Freq: Every day | SUBCUTANEOUS | 5 refills | Status: DC
  Filled 2023-01-13: qty 15, 62d supply, fill #0

## 2023-01-13 MED ORDER — INSULIN LISPRO (1 UNIT DIAL) 100 UNIT/ML (KWIKPEN)
24.0000 [IU] | PEN_INJECTOR | Freq: Three times a day (TID) | SUBCUTANEOUS | 11 refills | Status: DC
  Filled 2023-01-13: qty 24, 34d supply, fill #0

## 2023-01-13 NOTE — Progress Notes (Signed)
Patient ID: Audrey Mcmillan, female   DOB: [birth year], 42 y.o.   MRN: [id number] FACULTY PRACTICE ANTEPARTUM(COMPREHENSIVE) NOTE  Audrey Mcmillan is a 42 y.o. [phone] with Estimated Date of Delivery: 03/06/23   By  early ultrasound [redacted]w[redacted]d  who is admitted for BP management + glucose management.    Fetal presentation is cephalic. Length of Stay:  2  Days  Date of admission:01/11/2023  Subjective: Feels better today Patient reports the fetal movement as active. Patient reports uterine contraction  activity as none. Patient reports  vaginal bleeding as none. Patient describes fluid per vagina as None.  Vitals:  Blood pressure 134/75, pulse 85, temperature 98.5 F (36.9 C), temperature source Oral, resp. rate 15, height 5\' 2"  (1.575 m), weight 88.9 kg, SpO2 99%. Vitals:   01/12/23 1923 01/12/23 2321 01/13/23 0415 01/13/23 0741  BP: (!) 153/68 138/65 (!) 159/71 134/75  Pulse: 86 82 82 85  Resp: 18 17 16 15   Temp: 98.9 F (37.2 C) 98.1 F (36.7 C) 98.4 F (36.9 C) 98.5 F (36.9 C)  TempSrc: Oral Oral Oral Oral  SpO2: 100% 99% 99% 99%  Weight:      Height:       Physical Examination:  General appearance - alert, well appearing, and in no distress Abdomen - soft, nontender, nondistended, no masses or organomegaly Fundal Height:  size equals dates Pelvic Exam:  examination not indicated Cervical Exam: Not evaluated.. Extremities: extremities normal, atraumatic, no cyanosis or edema with DTRs 2+ bilaterally Membranes:intact  Fetal Monitoring:  Baseline: 140 bpm, Variability: Good {> 6 bpm), Accelerations: Reactive, and Decelerations: Absent   reactive  Labs:  Results for orders placed or performed during the hospital encounter of 01/11/23 (from the past 24 hours)  Glucose, capillary   Collection Time: 01/12/23 11:07 AM  Result Value Ref Range   Glucose-Capillary 187 (H) 70 - 99 mg/dL  Glucose, capillary   Collection Time: 01/12/23  2:27 PM  Result Value Ref Range    Glucose-Capillary 143 (H) 70 - 99 mg/dL  Glucose, capillary   Collection Time: 01/12/23  4:39 PM  Result Value Ref Range   Glucose-Capillary 203 (H) 70 - 99 mg/dL  Glucose, capillary   Collection Time: 01/12/23  9:28 PM  Result Value Ref Range   Glucose-Capillary 288 (H) 70 - 99 mg/dL  Glucose, capillary   Collection Time: 01/12/23 11:24 PM  Result Value Ref Range   Glucose-Capillary 233 (H) 70 - 99 mg/dL  Glucose, capillary   Collection Time: 01/13/23  4:13 AM  Result Value Ref Range   Glucose-Capillary 176 (H) 70 - 99 mg/dL  Glucose, capillary   Collection Time: 01/13/23  8:22 AM  Result Value Ref Range   Glucose-Capillary 150 (H) 70 - 99 mg/dL    Imaging Studies:    [place] MFM OB COMP + 14 WK Result Date: 01/12/2023 ----------------------------------------------------------------------  OBSTETRICS REPORT                        (Signed Final 01/12/2023 08:49 am) ---------------------------------------------------------------------- Patient Info  ID #:       [id number]                          D.O.B.:  Apr 02, 1980 (42 yrs)(F)  Name:       Audrey Mcmillan                 Visit Date: 01/11/2023 05:40 pm  [name] ---------------------------------------------------------------------- Performed By  Attending:        Noralee Space MD        Ref. Address:      [address]                                                              [place], [place]                                                              [address]  Performed By:     Isabel Caprice        [facility]  Referred By:      [facility]-                    Faculty Physician ---------------------------------------------------------------------- Orders  #  Description                           Code        Ordered By  1  [place] MFM OB  COMP + 14 WK                78469.62    Audrey Mcmillan ----------------------------------------------------------------------  #  Order #                     Accession #                Episode #  1  [id number]                   [id number]                 [id number] ---------------------------------------------------------------------- Indications  [redacted] weeks gestation of pregnancy                 Z3A.32  Pre-existing diabetes, type 2, in pregnancy,    O24.113  third trimester  Advanced maternal age multigravida 34+,         O57.523  third trimester  Proteinuria in pregnancy, antepartum            O12.10  Severe preeclampsia, third trimester  O14.13  Hypertension - Chronic/Pre-existing             O10.019 ---------------------------------------------------------------------- Fetal Evaluation  Num Of Fetuses:          1  Fetal Heart Rate(bpm):   132  Cardiac Activity:        Observed  Presentation:            Cephalic  Placenta:                Posterior  P. Cord Insertion:       Not well visualized  Amniotic Fluid  AFI FV:      Within normal limits  AFI Sum(cm)     %Tile       Largest Pocket(cm)  16.6            60          5.7  RUQ(cm)       RLQ(cm)       LUQ(cm)        LLQ(cm)  5.7           2             4.8            4.1 ---------------------------------------------------------------------- Biometry  BPD:        78  mm     G. Age:  31w 2d         16  %    CI:        75.57   %    70 - 86                                                          FL/HC:       21.7  %    19.1 - 21.3  HC:      284.5  mm     G. Age:  31w 2d        3.1  %    HC/AC:       0.99       0.96 - 1.17  AC:       286   mm     G. Age:  32w 4d         60  %    FL/BPD:      79.2  %    71 - 87  FL:       61.8  mm     G. Age:  32w 0d         31  %    FL/AC:       21.6  %    20 - 24  CER:      33.1  mm     G. Age:  28w 1d      < 2.3  %  Est. FW:    1921   gm     4 lb 4 oz     36  %  ---------------------------------------------------------------------- OB History  Blood Type:   O+  Gravidity:    3         Term:   1        Prem:   1        SAB:   0  TOP:  0       Ectopic:  0        Living: 2 ---------------------------------------------------------------------- Gestational Age  U/S Today:     31w 6d                                        EDD:   03/09/23  Best:          Armida Sans 2d     Det. ByMarcella Dubs         EDD:   03/06/23                                      (07/11/22) ---------------------------------------------------------------------- Anatomy  Cranium:               Appears normal         Aortic Arch:            Not well visualized  Cavum:                 Not well visualized    Ductal Arch:            Not well visualized  Ventricles:            Not well visualized    Diaphragm:              Appears normal  Choroid Plexus:        Not well visualized    Stomach:                Appears normal, left                                                                        sided  Cerebellum:            Appears normal         Abdomen:                Appears normal  Posterior Fossa:       Appears normal         Abdominal Wall:         Appears nml (cord                                                                        insert, abd wall)  Nuchal Fold:           Not applicable (< 16   Cord Vessels:           Appears normal ([redacted]                         wks GA)  vessel cord)  Lips:                  Appears normal         Kidneys:                Appear normal  Palate:                Not well visualized    Bladder:                Appears normal  Thoracic:              Appears normal         Spine:                  Appears normal  Heart:                 Appears normal         Upper Extremities:      Appears normal                         (4CH, axis, and                         situs)  RVOT:                  Not well visualized    Lower Extremities:       Appears normal  LVOT:                  Not well visualized ---------------------------------------------------------------------- Impression  [id number]. Patient was admitted with severe hypertension and  has possible superimposed preeclampsia and diabetic  nephropathy.  She has type 2 diabetes.  Fetal growth is appropriate for gestational age. Amniotic fluid  is normal and good fetal activity is seen. Fetal anatomical  survey is limited because of advanced gestational age. ---------------------------------------------------------------------- Recommendations  -Recommend BPP and then twice-weekly BPP. ----------------------------------------------------------------------                  Noralee Space, MD Electronically Signed Final Report   01/12/2023 08:49 am ----------------------------------------------------------------------     Medications:  Scheduled  aspirin  81 mg Oral Daily   docusate sodium  100 mg Oral Daily   insulin aspart  0-16 Units Subcutaneous Q4H   insulin aspart  24 Units Subcutaneous TID WC   insulin glargine-yfgn  20 Units Subcutaneous QHS   labetalol  200 mg Oral Q8H   levothyroxine  125 mcg Oral QAC breakfast   NIFEdipine  90 mg Oral Daily   polyethylene glycol  17 g Oral Daily   prenatal multivitamin  1 tablet Oral Q1200   sodium chloride flush  3 mL Intravenous Q12H   I have reviewed the patient's current medications.  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 07/18/2022   History of severe pre-eclampsia 12/05/2020   History of proteinuria in pregnancy 06/13/2020   AMA (advanced maternal age)  multigravida 35+ 05/31/2020   Preexisting diabetes complicating pregnancy, antepartum 05/31/2020   Type 2 diabetes mellitus with diabetic polyneuropathy, with long-term current use  of insulin (HCC) 06/27/2017   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

## 2023-01-13 NOTE — Discharge Summary (Addendum)
Physician Discharge Summary  Patient ID: Audrey Mcmillan MRN: [id number] DOB/AGE: 41-Oct-1982 41 y.o.  Admit date: 01/11/2023 Discharge date: 01/13/2023  Admission Diagnoses: [redacted]w[redacted]d cHTN, sub optimal control Chronic proteinuria Type 2(Class B) diabetes, insulin requiring  Discharge Diagnoses:  Principal Problem:   Chronic hypertension during pregnancy, antepartum SAA  Discharged Condition: stable  Hospital Course:  Admitted for improved BP management, no evidence of pre eclampsia superimposed Sonogram for growth and surveillance normal All labs normal, chronic proteinuria Received BMZ course with ^CBG-->insulin titrated up to Lantuss 24 at bedtime + Humalog 24 units with meals  Consults:   Significant Diagnostic Studies: labs: sonogram  Treatments: BMZ, BP management, diabetes management  Discharge Exam: Blood pressure 132/69, pulse 88, temperature 98.4 F (36.9 C), temperature source Oral, resp. rate 17, height 5\' 2"  (1.575 m), weight 88.9 kg, SpO2 98%. General appearance: alert, cooperative, and no distress GI: soft, non-tender; bowel sounds normal; no masses,  no organomegaly  Disposition:  Discharge disposition: 01-Home or Self Care       Discharge Instructions     Diet - low sodium heart healthy   Complete by: As directed    Increase activity slowly   Complete by: As directed       Allergies as of 01/13/2023   No Known Allergies      Medication List     TAKE these medications    Aspirin Low Dose 81 MG chewable tablet Generic drug: aspirin Chew 1 tablet (81 mg total) by mouth daily.   insulin glargine 100 UNIT/ML Solostar Pen Commonly known as: LANTUS Inject 24 Units into the skin at bedtime. What changed: how much to take   insulin lispro 100 UNIT/ML KwikPen Commonly known as: HUMALOG Inject 24 Units into the skin 3 (three) times daily with meals. What changed: how much to take   Insulin Syringe-Needle U-100 30G X 5/16" 1 ML  Misc Inject 1 application into the skin 2 times daily at 12 noon and 4 pm.   labetalol 200 MG tablet Commonly known as: NORMODYNE Take 1 tablet (200 mg total) by mouth every 8 (eight) hours.   labetalol 200 MG tablet Commonly known as: NORMODYNE Take 1 tablet (200 mg total) by mouth every 8 (eight) hours.   levothyroxine 125 MCG tablet Commonly known as: SYNTHROID Take 1 tablet (125 mcg total) by mouth daily before breakfast.   NIFEdipine 90 MG 24 hr tablet Commonly known as: PROCARDIA XL/NIFEDICAL-XL Take 1 tablet (90 mg total) by mouth daily. Start taking on: January 14, 2023 What changed:  medication strength how much to take   Prenatal 27-1 MG Tabs Take 1 tablet by mouth daily.   TechLite Pen Needles 32G X 4 MM Misc Generic drug: Insulin Pen Needle Use to inject insulin up to 4 times daily as directed.   TechLite Plus Pen Needles 32G X 4 MM Misc Generic drug: Insulin Pen Needle Use three times daily with Humalog.   True Metrix Blood Glucose Test test strip Generic drug: glucose blood Use as instructed. Check blood glucose levels twice per day.   True Metrix Meter w/Device Kit Use as instructed   TRUEplus Lancets 28G Misc Use as instructed. Check blood glucose levels twice per day.        Follow-up Information     [facility] Follow up on 01/17/2023.   Specialty: Obstetrics and Gynecology Why: OB visit Contact information: [address] [place] [address] [phone]  SignedLazaro Arms 01/13/2023, 4:02 PM

## 2023-01-13 NOTE — Discharge Instructions (Signed)
Labetalol 200mg  prescription (12 tablets) sent to [facility] at [address], [place], [place] [address] Other prescriptions sent to pharmacy on file to pick up tomorrow For now, take 1 1/2 tablets of current procardia xl tablets at home to equal 90mg  dose.

## 2023-01-14 ENCOUNTER — Other Ambulatory Visit: Payer: Self-pay | Admitting: *Deleted

## 2023-01-14 ENCOUNTER — Ambulatory Visit: Payer: Self-pay | Admitting: *Deleted

## 2023-01-14 ENCOUNTER — Other Ambulatory Visit: Payer: Self-pay

## 2023-01-14 ENCOUNTER — Ambulatory Visit: Payer: Self-pay | Attending: Obstetrics | Admitting: Obstetrics

## 2023-01-14 ENCOUNTER — Ambulatory Visit: Payer: Self-pay | Attending: Obstetrics and Gynecology

## 2023-01-14 DIAGNOSIS — O09523 Supervision of elderly multigravida, third trimester: Secondary | ICD-10-CM

## 2023-01-14 DIAGNOSIS — O10919 Unspecified pre-existing hypertension complicating pregnancy, unspecified trimester: Secondary | ICD-10-CM

## 2023-01-14 DIAGNOSIS — O09299 Supervision of pregnancy with other poor reproductive or obstetric history, unspecified trimester: Secondary | ICD-10-CM

## 2023-01-14 DIAGNOSIS — O24319 Unspecified pre-existing diabetes mellitus in pregnancy, unspecified trimester: Secondary | ICD-10-CM

## 2023-01-14 DIAGNOSIS — O0990 Supervision of high risk pregnancy, unspecified, unspecified trimester: Secondary | ICD-10-CM

## 2023-01-14 DIAGNOSIS — O24113 Pre-existing diabetes mellitus, type 2, in pregnancy, third trimester: Secondary | ICD-10-CM

## 2023-01-14 DIAGNOSIS — Z3A32 32 weeks gestation of pregnancy: Secondary | ICD-10-CM

## 2023-01-14 DIAGNOSIS — O10913 Unspecified pre-existing hypertension complicating pregnancy, third trimester: Secondary | ICD-10-CM

## 2023-01-14 NOTE — Progress Notes (Signed)
MFM Note  Audrey Mcmillan is currently at 32 weeks and 5 days. She was seen for a detailed fetal anatomy scan due to advanced maternal age (42 years old), pregestational diabetes treated with insulin, and chronic hypertension treated with nifedipine 90 mg daily.  She was just discharged from the hospital yesterday following admission for blood pressure and glycemic control.  Her serum creatinine levels were elevated and her P/C ratio was elevated (6.14).  She appears to have significant proteinuria prior to pregnancy possibly related to her uncontrolled diabetes.    She received a complete course of antenatal corticosteroids during her hospital admission.  Her blood pressures today were 151/75 and 160/61.  She denies any signs or symptoms of preeclampsia.  She had a cell free DNA test earlier in her pregnancy which indicated a low risk for trisomy 67, 80, and 13. A female fetus is predicted.   She was informed that the fetal growth and amniotic fluid level were appropriate for her gestational age.   There were no obvious fetal anomalies noted on today's ultrasound exam.  However, today's exam was limited due to the fetal position and her advanced gestational age.    The limitations of ultrasound in the detection of all anomalies was discussed.  A BPP performed today was 8 out of 8.  The patient was advised that due to her longstanding uncontrolled diabetes and chronic hypertension, she is at risk for developing preeclampsia.    Preeclampsia precautions were reviewed today.  Due to her medical conditions, we will continue to follow her with weekly fetal testing until delivery.  The patient understands that should her blood pressures remain elevated, should she develop preeclampsia, or should her glycemic control remain poor, that an earlier delivery may be recommended.    The goal for her delivery would be at 34 weeks or greater.  She will return in 1 week for another BPP.     The patient stated that all of her questions were answered today and agrees with her management plan.  A total of 40 minutes was spent counseling and coordinating the care for this patient.  Greater than 50% of the time was spent in direct face-to-face contact.

## 2023-01-18 ENCOUNTER — Ambulatory Visit: Payer: Self-pay

## 2023-01-18 ENCOUNTER — Encounter: Payer: Self-pay | Admitting: Obstetrics and Gynecology

## 2023-01-18 ENCOUNTER — Other Ambulatory Visit: Payer: Self-pay

## 2023-01-18 DIAGNOSIS — R7989 Other specified abnormal findings of blood chemistry: Secondary | ICD-10-CM | POA: Insufficient documentation

## 2023-01-21 ENCOUNTER — Ambulatory Visit (INDEPENDENT_AMBULATORY_CARE_PROVIDER_SITE_OTHER): Payer: Self-pay | Admitting: Obstetrics and Gynecology

## 2023-01-21 ENCOUNTER — Other Ambulatory Visit: Payer: Self-pay

## 2023-01-21 VITALS — BP 123/77 | HR 78 | Wt 196.9 lb

## 2023-01-21 DIAGNOSIS — O10913 Unspecified pre-existing hypertension complicating pregnancy, third trimester: Secondary | ICD-10-CM

## 2023-01-21 DIAGNOSIS — R7989 Other specified abnormal findings of blood chemistry: Secondary | ICD-10-CM

## 2023-01-21 DIAGNOSIS — Z794 Long term (current) use of insulin: Secondary | ICD-10-CM

## 2023-01-21 DIAGNOSIS — O2343 Unspecified infection of urinary tract in pregnancy, third trimester: Secondary | ICD-10-CM

## 2023-01-21 DIAGNOSIS — O2341 Unspecified infection of urinary tract in pregnancy, first trimester: Secondary | ICD-10-CM

## 2023-01-21 DIAGNOSIS — Z8759 Personal history of other complications of pregnancy, childbirth and the puerperium: Secondary | ICD-10-CM

## 2023-01-21 DIAGNOSIS — Z3A33 33 weeks gestation of pregnancy: Secondary | ICD-10-CM

## 2023-01-21 DIAGNOSIS — O1213 Gestational proteinuria, third trimester: Secondary | ICD-10-CM

## 2023-01-21 DIAGNOSIS — O10919 Unspecified pre-existing hypertension complicating pregnancy, unspecified trimester: Secondary | ICD-10-CM

## 2023-01-21 DIAGNOSIS — E039 Hypothyroidism, unspecified: Secondary | ICD-10-CM

## 2023-01-21 DIAGNOSIS — O1211 Gestational proteinuria, first trimester: Secondary | ICD-10-CM

## 2023-01-21 DIAGNOSIS — O09523 Supervision of elderly multigravida, third trimester: Secondary | ICD-10-CM

## 2023-01-21 DIAGNOSIS — O0993 Supervision of high risk pregnancy, unspecified, third trimester: Secondary | ICD-10-CM

## 2023-01-21 DIAGNOSIS — E1142 Type 2 diabetes mellitus with diabetic polyneuropathy: Secondary | ICD-10-CM

## 2023-01-21 DIAGNOSIS — O0990 Supervision of high risk pregnancy, unspecified, unspecified trimester: Secondary | ICD-10-CM

## 2023-01-22 ENCOUNTER — Telehealth: Payer: Self-pay

## 2023-01-22 ENCOUNTER — Other Ambulatory Visit: Payer: Self-pay | Admitting: Obstetrics and Gynecology

## 2023-01-22 LAB — COMPREHENSIVE METABOLIC PANEL
ALT: 21 [IU]/L (ref 0–32)
AST: 19 [IU]/L (ref 0–40)
Albumin: 3.3 g/dL — ABNORMAL LOW (ref 3.9–4.9)
Alkaline Phosphatase: 131 [IU]/L — ABNORMAL HIGH (ref 44–121)
BUN/Creatinine Ratio: 21 (ref 9–23)
BUN: 29 mg/dL — ABNORMAL HIGH (ref 6–24)
Bilirubin Total: <0.2 mg/dL (ref 0.0–1.2)
CO2: 17 mmol/L — ABNORMAL LOW (ref 20–29)
Calcium: 8.4 mg/dL — ABNORMAL LOW (ref 8.7–10.2)
Chloride: 107 mmol/L — ABNORMAL HIGH (ref 96–106)
Creatinine, Ser: 1.35 mg/dL — ABNORMAL HIGH (ref 0.57–1.00)
Globulin, Total: 2.4 g/dL (ref 1.5–4.5)
Glucose: 97 mg/dL (ref 70–99)
Potassium: 4.1 mmol/L (ref 3.5–5.2)
Sodium: 137 mmol/L (ref 134–144)
Total Protein: 5.7 g/dL — ABNORMAL LOW (ref 6.0–8.5)
eGFR: 51 mL/min/{1.73_m2} — ABNORMAL LOW (ref >59)

## 2023-01-22 NOTE — Progress Notes (Signed)
 OB Note Spoke to Dr. Ileana re: patient lab work, and he believes her Cr is elevated probably because of longstanding uncontrolled diabetes; he recommends weekly pre-eclampsia labs and 37wk delivery unless Cr keeps rising and/or BPs increase.  Patient getting BPPs with us  but he recommends switching her to be with MFM.  Bebe Izell Raddle MD Attending Center for [employer] (Faculty Practice) 01/22/2023 Time: [phone]

## 2023-01-24 ENCOUNTER — Other Ambulatory Visit: Payer: Self-pay

## 2023-01-24 ENCOUNTER — Other Ambulatory Visit: Payer: Self-pay | Admitting: Obstetrics and Gynecology

## 2023-01-24 DIAGNOSIS — Z3A34 34 weeks gestation of pregnancy: Secondary | ICD-10-CM

## 2023-01-24 DIAGNOSIS — R7989 Other specified abnormal findings of blood chemistry: Secondary | ICD-10-CM

## 2023-01-24 DIAGNOSIS — O10913 Unspecified pre-existing hypertension complicating pregnancy, third trimester: Secondary | ICD-10-CM

## 2023-01-24 DIAGNOSIS — O09523 Supervision of elderly multigravida, third trimester: Secondary | ICD-10-CM

## 2023-01-24 DIAGNOSIS — O10919 Unspecified pre-existing hypertension complicating pregnancy, unspecified trimester: Secondary | ICD-10-CM

## 2023-01-24 DIAGNOSIS — O0990 Supervision of high risk pregnancy, unspecified, unspecified trimester: Secondary | ICD-10-CM

## 2023-01-24 DIAGNOSIS — O09293 Supervision of pregnancy with other poor reproductive or obstetric history, third trimester: Secondary | ICD-10-CM

## 2023-01-24 DIAGNOSIS — O09299 Supervision of pregnancy with other poor reproductive or obstetric history, unspecified trimester: Secondary | ICD-10-CM

## 2023-01-24 DIAGNOSIS — O24113 Pre-existing diabetes mellitus, type 2, in pregnancy, third trimester: Secondary | ICD-10-CM

## 2023-01-24 LAB — CULTURE, OB URINE

## 2023-01-24 LAB — URINE CULTURE, OB REFLEX

## 2023-01-24 NOTE — Telephone Encounter (Signed)
 Per Vergie Living MD and MFM, patient should be seen for all BPPs with MFM. Appts rescheduled where possible and patient notified by phone on 01/22/23.

## 2023-01-25 ENCOUNTER — Other Ambulatory Visit: Payer: Self-pay

## 2023-01-25 DIAGNOSIS — Z3A34 34 weeks gestation of pregnancy: Secondary | ICD-10-CM

## 2023-01-25 DIAGNOSIS — O10919 Unspecified pre-existing hypertension complicating pregnancy, unspecified trimester: Secondary | ICD-10-CM

## 2023-01-25 MED ORDER — NITROFURANTOIN MONOHYD MACRO 100 MG PO CAPS
100.0000 mg | ORAL_CAPSULE | Freq: Two times a day (BID) | ORAL | 0 refills | Status: DC
  Filled 2023-01-25: qty 14, 7d supply, fill #0

## 2023-01-26 LAB — CBC
Hematocrit: 33.4 % — ABNORMAL LOW (ref 34.0–46.6)
Hemoglobin: 11.1 g/dL (ref 11.1–15.9)
MCH: 30.7 pg (ref 26.6–33.0)
MCHC: 33.2 g/dL (ref 31.5–35.7)
MCV: 92 fL (ref 79–97)
Platelets: 211 10*3/uL (ref 150–450)
RBC: 3.62 x10E6/uL — ABNORMAL LOW (ref 3.77–5.28)
RDW: 12.4 % (ref 11.7–15.4)
WBC: 10.2 10*3/uL (ref 3.4–10.8)

## 2023-01-26 LAB — COMPREHENSIVE METABOLIC PANEL
ALT: 17 [IU]/L (ref 0–32)
AST: 10 [IU]/L (ref 0–40)
Albumin: 3.3 g/dL — ABNORMAL LOW (ref 3.9–4.9)
Alkaline Phosphatase: 136 [IU]/L — ABNORMAL HIGH (ref 44–121)
BUN/Creatinine Ratio: 17 (ref 9–23)
BUN: 20 mg/dL (ref 6–24)
Bilirubin Total: <0.2 mg/dL (ref 0.0–1.2)
CO2: 17 mmol/L — ABNORMAL LOW (ref 20–29)
Calcium: 8.6 mg/dL — ABNORMAL LOW (ref 8.7–10.2)
Chloride: 107 mmol/L — ABNORMAL HIGH (ref 96–106)
Creatinine, Ser: 1.19 mg/dL — ABNORMAL HIGH (ref 0.57–1.00)
Globulin, Total: 2.4 g/dL (ref 1.5–4.5)
Glucose: 123 mg/dL — ABNORMAL HIGH (ref 70–99)
Potassium: 4 mmol/L (ref 3.5–5.2)
Sodium: 136 mmol/L (ref 134–144)
Total Protein: 5.7 g/dL — ABNORMAL LOW (ref 6.0–8.5)
eGFR: 59 mL/min/{1.73_m2} — ABNORMAL LOW (ref >59)

## 2023-01-28 ENCOUNTER — Other Ambulatory Visit: Payer: Self-pay

## 2023-01-28 ENCOUNTER — Encounter: Payer: Self-pay | Admitting: Obstetrics and Gynecology

## 2023-01-28 ENCOUNTER — Ambulatory Visit: Payer: Self-pay | Attending: Obstetrics

## 2023-01-28 ENCOUNTER — Ambulatory Visit: Payer: Self-pay

## 2023-01-29 ENCOUNTER — Other Ambulatory Visit: Payer: Self-pay

## 2023-01-29 ENCOUNTER — Ambulatory Visit (INDEPENDENT_AMBULATORY_CARE_PROVIDER_SITE_OTHER): Payer: Self-pay | Admitting: Obstetrics and Gynecology

## 2023-01-29 VITALS — BP 129/80 | HR 84 | Wt 194.5 lb

## 2023-01-29 DIAGNOSIS — O10919 Unspecified pre-existing hypertension complicating pregnancy, unspecified trimester: Secondary | ICD-10-CM

## 2023-01-29 DIAGNOSIS — O1211 Gestational proteinuria, first trimester: Secondary | ICD-10-CM

## 2023-01-29 DIAGNOSIS — O24319 Unspecified pre-existing diabetes mellitus in pregnancy, unspecified trimester: Secondary | ICD-10-CM

## 2023-01-29 DIAGNOSIS — R7989 Other specified abnormal findings of blood chemistry: Secondary | ICD-10-CM

## 2023-01-29 DIAGNOSIS — O09523 Supervision of elderly multigravida, third trimester: Secondary | ICD-10-CM

## 2023-01-29 DIAGNOSIS — Z8759 Personal history of other complications of pregnancy, childbirth and the puerperium: Secondary | ICD-10-CM

## 2023-01-29 DIAGNOSIS — O2341 Unspecified infection of urinary tract in pregnancy, first trimester: Secondary | ICD-10-CM

## 2023-01-29 DIAGNOSIS — Z3A34 34 weeks gestation of pregnancy: Secondary | ICD-10-CM

## 2023-01-29 DIAGNOSIS — E039 Hypothyroidism, unspecified: Secondary | ICD-10-CM

## 2023-01-29 DIAGNOSIS — Z794 Long term (current) use of insulin: Secondary | ICD-10-CM

## 2023-01-29 DIAGNOSIS — E1142 Type 2 diabetes mellitus with diabetic polyneuropathy: Secondary | ICD-10-CM

## 2023-01-29 DIAGNOSIS — O0990 Supervision of high risk pregnancy, unspecified, unspecified trimester: Secondary | ICD-10-CM

## 2023-01-29 NOTE — Progress Notes (Signed)
 PRENATAL VISIT NOTE  Subjective:  Audrey Mcmillan is a 43 y.o. [phone] at [redacted]w[redacted]d being seen today for ongoing prenatal care.  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  Patient reports no complaints.  Contractions: Not present. Vag. Bleeding: None.  Movement: Present. Denies leaking of fluid.   The following portions of the patient's history were reviewed and updated as appropriate: allergies, current medications, past family history, past medical history, past social history, past surgical history and problem list.   Objective:   Vitals:   01/29/23 1124  BP: 129/80  Pulse: 84  Weight: 194 lb 8 oz (88.2 kg)    Fetal Status: Fetal Heart Rate (bpm): 135   Movement: Present     General:  Alert, oriented and cooperative. Patient is in no acute distress.  Skin: Skin is warm and dry. No rash noted.   Cardiovascular: Normal heart rate noted  Respiratory: Normal respiratory effort, no problems with respiration noted  Abdomen: Soft, gravid, appropriate for gestational age.  Pain/Pressure: Absent     Pelvic: Cervical exam deferred        Extremities: Normal range of motion.  Edema: Trace  Mental Status: Normal mood and affect. Normal behavior. Normal judgment and thought content.   Assessment and Plan:  Pregnancy: G3P1102 at [redacted]w[redacted]d 1. Chronic hypertension affecting  pregnancy (Primary) Continue on procardia  90 qday; pt didn't know about bpp yesterday. Will set up for later this week - CBC - Comprehensive metabolic panel  2. [redacted] weeks gestation of pregnancy See above>>1/2 bpp not signed. I reviewed the images and appears bpp 8/8, cephalic, afi 10.6 12/23: 63%, 2192gm, ac 94%, afi 11.6, bpp 8/8, cephalic D/w her re: setting up 37wk IOL; pt to would like to think over dates and let us  know GBS next visit Self pay - CBC - Comprehensive metabolic panel  3. Acquired hypothyroidism Doing well on synthroid  125. Normal tsh 11/21  4. Multigravida of advanced maternal age in third trimester No issues  5. History of proteinuria in pregnancy Baseline. Needs renal/endo follow up PP  6. Elevated serum creatinine See below    Latest Ref Rng & Units 01/25/2023    8:21 AM 01/21/2023    4:41 PM 01/13/2023    9:37 AM  CMP  Glucose 70 - 99 mg/dL 876  97  851   BUN 6 - 24 mg/dL 20  29  22    Creatinine 0.57 - 1.00 mg/dL 8.80  8.64  8.87   Sodium 134 - 144 mmol/L 136  137  133   Potassium 3.5 - 5.2 mmol/L 4.0  4.1  4.0   Chloride 96 - 106 mmol/L 107  107  109   CO2 20 - 29 mmol/L 17  17  16    Calcium  8.7 - 10.2 mg/dL 8.6  8.4  8.9   Total Protein 6.0 - 8.5 g/dL 5.7  5.7  5.8   Total Bilirubin 0.0 - 1.2 mg/dL <9.7  <9.7  0.3   Alkaline Phos 44 - 121 IU/L 136  131  93   AST 0 - 40 IU/L 10  19  18    ALT 0 - 32 IU/L 17  21  21      7. History of severe pre-eclampsia Continue with twice weekly labs.   8. Preexisting diabetes complicating pregnancy, antepartum Normal CBG log sheet with  on Lantus  24 at bedtime, Humalog  24 tid ac   9. Supervision of high risk pregnancy, antepartum  10. Type 2 diabetes mellitus with diabetic polyneuropathy, with long-term current use of insulin  (HCC)  11. Urinary tract infection in mother during first trimester of pregnancy Pt not aware abx sent in  Preterm labor symptoms and general obstetric precautions including but  not limited to vaginal bleeding, contractions, leaking of fluid and fetal movement were reviewed in detail with the patient. Please refer to After Visit Summary for other counseling recommendations.   Return in about 2 days (around 01/31/2023) for 2-3d, in office nst/bpp only visit and labs and bp check with rn.  Future Appointments  Date Time Provider Department Center  01/31/2023  3:15 PM [facility] US2 [facility]  02/04/2023 10:55 AM Izell Harari, MD [facility]  02/05/2023  1:15 PM [facility] NURSE [facility]  02/05/2023  1:30 PM [facility] US6 [facility]  02/11/2023  2:15 PM [facility] NURSE [facility]  02/11/2023  2:30 PM [facility] US3 [facility]    Harari Izell, MD

## 2023-01-30 LAB — CBC
Hematocrit: 36 % (ref 34.0–46.6)
Hemoglobin: 11.9 g/dL (ref 11.1–15.9)
MCH: 31.2 pg (ref 26.6–33.0)
MCHC: 33.1 g/dL (ref 31.5–35.7)
MCV: 94 fL (ref 79–97)
Platelets: 231 10*3/uL (ref 150–450)
RBC: 3.82 x10E6/uL (ref 3.77–5.28)
RDW: 12.1 % (ref 11.7–15.4)
WBC: 11.5 10*3/uL — ABNORMAL HIGH (ref 3.4–10.8)

## 2023-01-30 LAB — COMPREHENSIVE METABOLIC PANEL
ALT: 23 [IU]/L (ref 0–32)
AST: 17 [IU]/L (ref 0–40)
Albumin: 3.4 g/dL — ABNORMAL LOW (ref 3.9–4.9)
Alkaline Phosphatase: 157 [IU]/L — ABNORMAL HIGH (ref 44–121)
BUN/Creatinine Ratio: 13 (ref 9–23)
BUN: 16 mg/dL (ref 6–24)
Bilirubin Total: 0.2 mg/dL (ref 0.0–1.2)
CO2: 18 mmol/L — ABNORMAL LOW (ref 20–29)
Calcium: 9.1 mg/dL (ref 8.7–10.2)
Chloride: 104 mmol/L (ref 96–106)
Creatinine, Ser: 1.23 mg/dL — ABNORMAL HIGH (ref 0.57–1.00)
Globulin, Total: 2.6 g/dL (ref 1.5–4.5)
Glucose: 226 mg/dL — ABNORMAL HIGH (ref 70–99)
Potassium: 4.5 mmol/L (ref 3.5–5.2)
Sodium: 139 mmol/L (ref 134–144)
Total Protein: 6 g/dL (ref 6.0–8.5)
eGFR: 57 mL/min/{1.73_m2} — ABNORMAL LOW (ref >59)

## 2023-01-31 ENCOUNTER — Other Ambulatory Visit: Payer: Self-pay | Admitting: Obstetrics and Gynecology

## 2023-01-31 ENCOUNTER — Other Ambulatory Visit: Payer: Self-pay

## 2023-01-31 VITALS — BP 144/74 | HR 76

## 2023-01-31 DIAGNOSIS — O09293 Supervision of pregnancy with other poor reproductive or obstetric history, third trimester: Secondary | ICD-10-CM

## 2023-01-31 DIAGNOSIS — O10919 Unspecified pre-existing hypertension complicating pregnancy, unspecified trimester: Secondary | ICD-10-CM

## 2023-01-31 DIAGNOSIS — O09523 Supervision of elderly multigravida, third trimester: Secondary | ICD-10-CM

## 2023-01-31 DIAGNOSIS — O09299 Supervision of pregnancy with other poor reproductive or obstetric history, unspecified trimester: Secondary | ICD-10-CM

## 2023-01-31 DIAGNOSIS — O24113 Pre-existing diabetes mellitus, type 2, in pregnancy, third trimester: Secondary | ICD-10-CM

## 2023-01-31 DIAGNOSIS — O09529 Supervision of elderly multigravida, unspecified trimester: Secondary | ICD-10-CM

## 2023-01-31 DIAGNOSIS — O24319 Unspecified pre-existing diabetes mellitus in pregnancy, unspecified trimester: Secondary | ICD-10-CM

## 2023-01-31 DIAGNOSIS — Z3A35 35 weeks gestation of pregnancy: Secondary | ICD-10-CM

## 2023-01-31 DIAGNOSIS — O10913 Unspecified pre-existing hypertension complicating pregnancy, third trimester: Secondary | ICD-10-CM

## 2023-01-31 NOTE — Progress Notes (Unsigned)
 Pt denies H/A or visual disturbances.  Labs drawn per plan of care from Dr. Vergie Living 1/7 visit note.

## 2023-02-01 ENCOUNTER — Other Ambulatory Visit: Payer: Self-pay

## 2023-02-01 LAB — COMPREHENSIVE METABOLIC PANEL
ALT: 22 [IU]/L (ref 0–32)
AST: 19 [IU]/L (ref 0–40)
Albumin: 3.2 g/dL — ABNORMAL LOW (ref 3.9–4.9)
Alkaline Phosphatase: 139 [IU]/L — ABNORMAL HIGH (ref 44–121)
BUN/Creatinine Ratio: 13 (ref 9–23)
BUN: 17 mg/dL (ref 6–24)
Bilirubin Total: <0.2 mg/dL (ref 0.0–1.2)
CO2: 16 mmol/L — ABNORMAL LOW (ref 20–29)
Calcium: 9.1 mg/dL (ref 8.7–10.2)
Chloride: 107 mmol/L — ABNORMAL HIGH (ref 96–106)
Creatinine, Ser: 1.34 mg/dL — ABNORMAL HIGH (ref 0.57–1.00)
Globulin, Total: 2.6 g/dL (ref 1.5–4.5)
Glucose: 126 mg/dL — ABNORMAL HIGH (ref 70–99)
Potassium: 4.5 mmol/L (ref 3.5–5.2)
Sodium: 136 mmol/L (ref 134–144)
Total Protein: 5.8 g/dL — ABNORMAL LOW (ref 6.0–8.5)
eGFR: 51 mL/min/{1.73_m2} — ABNORMAL LOW (ref >59)

## 2023-02-01 LAB — CBC
Hematocrit: 32.5 % — ABNORMAL LOW (ref 34.0–46.6)
Hemoglobin: 11.1 g/dL (ref 11.1–15.9)
MCH: 31.5 pg (ref 26.6–33.0)
MCHC: 34.2 g/dL (ref 31.5–35.7)
MCV: 92 fL (ref 79–97)
Platelets: 206 10*3/uL (ref 150–450)
RBC: 3.52 x10E6/uL — ABNORMAL LOW (ref 3.77–5.28)
RDW: 12.6 % (ref 11.7–15.4)
WBC: 9 10*3/uL (ref 3.4–10.8)

## 2023-02-04 ENCOUNTER — Ambulatory Visit (INDEPENDENT_AMBULATORY_CARE_PROVIDER_SITE_OTHER): Payer: Self-pay | Admitting: Obstetrics and Gynecology

## 2023-02-04 ENCOUNTER — Other Ambulatory Visit: Payer: Self-pay

## 2023-02-04 VITALS — BP 139/80 | HR 81 | Wt 198.1 lb

## 2023-02-04 DIAGNOSIS — R7989 Other specified abnormal findings of blood chemistry: Secondary | ICD-10-CM

## 2023-02-04 DIAGNOSIS — Z3A35 35 weeks gestation of pregnancy: Secondary | ICD-10-CM

## 2023-02-04 DIAGNOSIS — O10913 Unspecified pre-existing hypertension complicating pregnancy, third trimester: Secondary | ICD-10-CM

## 2023-02-04 DIAGNOSIS — O10919 Unspecified pre-existing hypertension complicating pregnancy, unspecified trimester: Secondary | ICD-10-CM

## 2023-02-04 NOTE — Progress Notes (Signed)
 PRENATAL VISIT NOTE  Subjective:  Audrey Mcmillan is a 43 y.o. [phone] at [redacted]w[redacted]d being seen today for ongoing prenatal care.  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  Patient reports no complaints.  Contractions: Not present. Vag. Bleeding: None.  Movement: Present. Denies leaking of fluid.   The following portions of the patient's history were reviewed and updated as appropriate: allergies, current medications, past family history, past medical history, past social history, past surgical history and problem list.   Objective:   Vitals:   02/04/23 1130 02/04/23 1135  BP: (!) 150/82 139/80  Pulse: (!) 142 81  Weight: 198 lb 1 oz (89.8 kg)     Fetal Status:     Movement: Present     General:  Alert, oriented and cooperative. Patient is in no acute distress.  Skin: Skin is warm and dry. No rash noted.   Cardiovascular: Normal heart rate noted  Respiratory: Normal respiratory effort, no problems with respiration noted  Abdomen: Soft, gravid, appropriate for gestational age.  Pain/Pressure: Present (pressure)     Pelvic: Cervical exam deferred        Extremities: Normal range of motion.  Edema: Trace  Mental Status: Normal mood and affect. Normal behavior. Normal judgment and thought content.   Assessment and Plan:  Pregnancy: G3P1102 at [redacted]w[redacted]d 1. Urinary  tract infection in mother during first trimester of pregnancy (Primary) Test of cure late feb   2. Supervision of high risk pregnancy, antepartum Self pay patient Declines GBS today. Needs it next week.   3. Elevated serum creatinine 37wk delivery as long as labs and other AP testing reassuring. Continue with 2x/week labs (done today)    Latest Ref Rng & Units 01/31/2023    4:21 PM 01/29/2023   11:27 AM 01/25/2023    8:21 AM  CBC  WBC 3.4 - 10.8 x10E3/uL 9.0  11.5  10.2   Hemoglobin 11.1 - 15.9 g/dL 88.8  88.0  88.8   Hematocrit 34.0 - 46.6 % 32.5  36.0  33.4   Platelets 150 - 450 x10E3/uL 206  231  211    4. Chronic hypertension affecting pregnancy Doing well on procardia  90 qday   5. [redacted] weeks gestation of pregnancy BPP tomorrow 1/9: cephalic, bpp 8/8, afi 10.5 12/23: 63%, 2192g, ac 94%, bpp 8/8, afi 11.65   6. Type 2 diabetes mellitus with diabetic polyneuropathy, with long-term current use of insulin  [facility]) Patient confirms on Lantus  24 at bedtime, Humalog  24 tid ac and normal AM fastings and 2h postprandials   7. Acquired hypothyroidism Continue synthroid  125. Normal tsh 11/21   8. History of severe pre-eclampsia Continue low dose asa   9. History of proteinuria in pregnancy Baseline proteinuria   10. Multigravida of advanced maternal age in third trimester No issues.   Preterm labor symptoms and general obstetric precautions including but not limited to vaginal bleeding, contractions, leaking of fluid and fetal movement were reviewed in detail with the patient. Please refer to After Visit Summary for other counseling  recommendations.   Return in 3 days (on 02/07/2023) for lab and bp check only visit with RN.  Future Appointments  Date Time Provider Department Center  02/05/2023  1:15 PM [facility] NURSE [facility]  02/05/2023  1:30 PM [facility] US6 [facility]  02/11/2023  2:15 PM [facility] NURSE [facility]  02/11/2023  2:30 PM [facility] US3 [facility]  02/15/2023  7:00 AM  [facility] SCHED ROOM [facility] None    Bebe Furry, MD

## 2023-02-05 ENCOUNTER — Other Ambulatory Visit: Payer: Self-pay | Admitting: Maternal & Fetal Medicine

## 2023-02-05 ENCOUNTER — Ambulatory Visit: Payer: Self-pay

## 2023-02-05 ENCOUNTER — Other Ambulatory Visit: Payer: Self-pay

## 2023-02-05 ENCOUNTER — Encounter: Payer: Self-pay | Admitting: *Deleted

## 2023-02-05 ENCOUNTER — Ambulatory Visit: Payer: Self-pay | Attending: Obstetrics and Gynecology | Admitting: *Deleted

## 2023-02-05 ENCOUNTER — Ambulatory Visit: Payer: Self-pay | Admitting: *Deleted

## 2023-02-05 DIAGNOSIS — O288 Other abnormal findings on antenatal screening of mother: Secondary | ICD-10-CM

## 2023-02-05 DIAGNOSIS — Z3A35 35 weeks gestation of pregnancy: Secondary | ICD-10-CM | POA: Insufficient documentation

## 2023-02-05 DIAGNOSIS — O10913 Unspecified pre-existing hypertension complicating pregnancy, third trimester: Secondary | ICD-10-CM | POA: Insufficient documentation

## 2023-02-05 DIAGNOSIS — Z369 Encounter for antenatal screening, unspecified: Secondary | ICD-10-CM | POA: Insufficient documentation

## 2023-02-05 DIAGNOSIS — O24113 Pre-existing diabetes mellitus, type 2, in pregnancy, third trimester: Secondary | ICD-10-CM | POA: Insufficient documentation

## 2023-02-05 DIAGNOSIS — E119 Type 2 diabetes mellitus without complications: Secondary | ICD-10-CM

## 2023-02-05 DIAGNOSIS — O09523 Supervision of elderly multigravida, third trimester: Secondary | ICD-10-CM

## 2023-02-05 DIAGNOSIS — O10013 Pre-existing essential hypertension complicating pregnancy, third trimester: Secondary | ICD-10-CM

## 2023-02-05 LAB — COMPREHENSIVE METABOLIC PANEL
ALT: 26 [IU]/L (ref 0–32)
AST: 21 [IU]/L (ref 0–40)
Albumin: 3.2 g/dL — ABNORMAL LOW (ref 3.9–4.9)
Alkaline Phosphatase: 145 [IU]/L — ABNORMAL HIGH (ref 44–121)
BUN/Creatinine Ratio: 14 (ref 9–23)
BUN: 16 mg/dL (ref 6–24)
Bilirubin Total: <0.2 mg/dL (ref 0.0–1.2)
CO2: 17 mmol/L — ABNORMAL LOW (ref 20–29)
Calcium: 8.7 mg/dL (ref 8.7–10.2)
Chloride: 105 mmol/L (ref 96–106)
Creatinine, Ser: 1.12 mg/dL — ABNORMAL HIGH (ref 0.57–1.00)
Globulin, Total: 2.6 g/dL (ref 1.5–4.5)
Glucose: 280 mg/dL — ABNORMAL HIGH (ref 70–99)
Potassium: 5.1 mmol/L (ref 3.5–5.2)
Sodium: 135 mmol/L (ref 134–144)
Total Protein: 5.8 g/dL — ABNORMAL LOW (ref 6.0–8.5)
eGFR: 63 mL/min/{1.73_m2} (ref >59)

## 2023-02-05 LAB — CBC
Hematocrit: 33.8 % — ABNORMAL LOW (ref 34.0–46.6)
Hemoglobin: 11.2 g/dL (ref 11.1–15.9)
MCH: 31.4 pg (ref 26.6–33.0)
MCHC: 33.1 g/dL (ref 31.5–35.7)
MCV: 95 fL (ref 79–97)
Platelets: 208 10*3/uL (ref 150–450)
RBC: 3.57 x10E6/uL — ABNORMAL LOW (ref 3.77–5.28)
RDW: 12.1 % (ref 11.7–15.4)
WBC: 8.1 10*3/uL (ref 3.4–10.8)

## 2023-02-05 NOTE — Procedures (Signed)
 Audrey Mcmillan [birth year] [redacted]w[redacted]d  Fetus A Non-Stress Test Interpretation for 02/05/23  Indication: Chronic Hypertenstion, Diabetes, and Advanced Maternal Age >40 years  Fetal Heart Rate A Mode: External Baseline Rate (A): 150 bpm Variability: Moderate Accelerations: 10 x 10 Decelerations: None Multiple birth?: No  Uterine Activity Mode: Palpation, Toco Contraction Frequency (min): None Resting Tone Palpated: Relaxed Resting Time: Adequate  Interpretation (Fetal Testing) Nonstress Test Interpretation: Non-reactive Comments: Dr. Kizzie reviewed tracing. BPP added.

## 2023-02-07 ENCOUNTER — Ambulatory Visit (INDEPENDENT_AMBULATORY_CARE_PROVIDER_SITE_OTHER): Payer: Self-pay | Admitting: *Deleted

## 2023-02-07 ENCOUNTER — Other Ambulatory Visit: Payer: Self-pay

## 2023-02-07 VITALS — BP 135/79 | Wt 197.8 lb

## 2023-02-07 DIAGNOSIS — O0990 Supervision of high risk pregnancy, unspecified, unspecified trimester: Secondary | ICD-10-CM

## 2023-02-07 DIAGNOSIS — Z8759 Personal history of other complications of pregnancy, childbirth and the puerperium: Secondary | ICD-10-CM

## 2023-02-07 DIAGNOSIS — E1142 Type 2 diabetes mellitus with diabetic polyneuropathy: Secondary | ICD-10-CM

## 2023-02-07 DIAGNOSIS — O09529 Supervision of elderly multigravida, unspecified trimester: Secondary | ICD-10-CM

## 2023-02-07 DIAGNOSIS — O10919 Unspecified pre-existing hypertension complicating pregnancy, unspecified trimester: Secondary | ICD-10-CM

## 2023-02-07 DIAGNOSIS — E039 Hypothyroidism, unspecified: Secondary | ICD-10-CM

## 2023-02-07 DIAGNOSIS — E7841 Elevated Lipoprotein(a): Secondary | ICD-10-CM

## 2023-02-07 DIAGNOSIS — O24319 Unspecified pre-existing diabetes mellitus in pregnancy, unspecified trimester: Secondary | ICD-10-CM

## 2023-02-07 DIAGNOSIS — O1211 Gestational proteinuria, first trimester: Secondary | ICD-10-CM

## 2023-02-07 NOTE — Progress Notes (Signed)
Here for BP check and labs at 36.1weeks with CHTN , hx severe pre-ecclampsia, elevated protein and creatinine. BP 135/79. FHR 136. Denies headaches , 1+ edema. Reviewed signs of pre-eclampsia. Labs drawn. Audrey Mcmillan

## 2023-02-08 ENCOUNTER — Encounter: Payer: Self-pay | Admitting: Obstetrics and Gynecology

## 2023-02-08 ENCOUNTER — Telehealth (HOSPITAL_COMMUNITY): Payer: Self-pay | Admitting: *Deleted

## 2023-02-08 ENCOUNTER — Encounter (HOSPITAL_COMMUNITY): Payer: Self-pay | Admitting: Obstetrics & Gynecology

## 2023-02-08 ENCOUNTER — Other Ambulatory Visit: Payer: Self-pay

## 2023-02-08 ENCOUNTER — Encounter (HOSPITAL_COMMUNITY): Payer: Self-pay | Admitting: *Deleted

## 2023-02-08 ENCOUNTER — Inpatient Hospital Stay (HOSPITAL_COMMUNITY)
Admission: AD | Admit: 2023-02-08 | Discharge: 2023-02-12 | DRG: 787 | Disposition: A | Discharge status: Home / Self Care | Payer: MEDICAID | Attending: Obstetrics and Gynecology | Admitting: Obstetrics and Gynecology

## 2023-02-08 DIAGNOSIS — O2412 Pre-existing diabetes mellitus, type 2, in childbirth: Secondary | ICD-10-CM | POA: Diagnosis present

## 2023-02-08 DIAGNOSIS — O24319 Unspecified pre-existing diabetes mellitus in pregnancy, unspecified trimester: Secondary | ICD-10-CM | POA: Diagnosis present

## 2023-02-08 DIAGNOSIS — O1092 Unspecified pre-existing hypertension complicating childbirth: Secondary | ICD-10-CM | POA: Diagnosis present

## 2023-02-08 DIAGNOSIS — O9081 Anemia of the puerperium: Secondary | ICD-10-CM | POA: Diagnosis not present

## 2023-02-08 DIAGNOSIS — O99284 Endocrine, nutritional and metabolic diseases complicating childbirth: Secondary | ICD-10-CM | POA: Diagnosis present

## 2023-02-08 DIAGNOSIS — E1142 Type 2 diabetes mellitus with diabetic polyneuropathy: Secondary | ICD-10-CM | POA: Diagnosis present

## 2023-02-08 DIAGNOSIS — E039 Hypothyroidism, unspecified: Secondary | ICD-10-CM | POA: Diagnosis present

## 2023-02-08 DIAGNOSIS — O368130 Decreased fetal movements, third trimester, not applicable or unspecified: Secondary | ICD-10-CM | POA: Diagnosis present

## 2023-02-08 DIAGNOSIS — O10919 Unspecified pre-existing hypertension complicating pregnancy, unspecified trimester: Secondary | ICD-10-CM | POA: Diagnosis present

## 2023-02-08 DIAGNOSIS — D62 Acute posthemorrhagic anemia: Secondary | ICD-10-CM | POA: Diagnosis not present

## 2023-02-08 DIAGNOSIS — O1211 Gestational proteinuria, first trimester: Secondary | ICD-10-CM | POA: Diagnosis present

## 2023-02-08 DIAGNOSIS — R519 Headache, unspecified: Secondary | ICD-10-CM | POA: Diagnosis present

## 2023-02-08 DIAGNOSIS — Z794 Long term (current) use of insulin: Secondary | ICD-10-CM

## 2023-02-08 DIAGNOSIS — Z98891 History of uterine scar from previous surgery: Principal | ICD-10-CM

## 2023-02-08 DIAGNOSIS — O359XX0 Maternal care for (suspected) fetal abnormality and damage, unspecified, not applicable or unspecified: Secondary | ICD-10-CM | POA: Diagnosis present

## 2023-02-08 DIAGNOSIS — Z8759 Personal history of other complications of pregnancy, childbirth and the puerperium: Secondary | ICD-10-CM

## 2023-02-08 DIAGNOSIS — O0990 Supervision of high risk pregnancy, unspecified, unspecified trimester: Secondary | ICD-10-CM

## 2023-02-08 DIAGNOSIS — O114 Pre-existing hypertension with pre-eclampsia, complicating childbirth: Principal | ICD-10-CM | POA: Diagnosis present

## 2023-02-08 DIAGNOSIS — O119 Pre-existing hypertension with pre-eclampsia, unspecified trimester: Principal | ICD-10-CM | POA: Diagnosis present

## 2023-02-08 DIAGNOSIS — O09529 Supervision of elderly multigravida, unspecified trimester: Secondary | ICD-10-CM

## 2023-02-08 DIAGNOSIS — Z3A36 36 weeks gestation of pregnancy: Secondary | ICD-10-CM | POA: Diagnosis not present

## 2023-02-08 DIAGNOSIS — O1410 Severe pre-eclampsia, unspecified trimester: Secondary | ICD-10-CM | POA: Diagnosis present

## 2023-02-08 LAB — COMPREHENSIVE METABOLIC PANEL
ALT: 22 [IU]/L (ref 0–32)
ALT: 26 U/L (ref 0–44)
AST: 17 [IU]/L (ref 0–40)
AST: 26 U/L (ref 15–41)
Albumin: 3.1 g/dL — ABNORMAL LOW (ref 3.9–4.9)
Albumin: 2.4 g/dL — ABNORMAL LOW (ref 3.5–5.0)
Alkaline Phosphatase: 142 [IU]/L — ABNORMAL HIGH (ref 44–121)
Alkaline Phosphatase: 142 U/L — ABNORMAL HIGH (ref 38–126)
Anion gap: 11 (ref 5–15)
BUN/Creatinine Ratio: 16 (ref 9–23)
BUN: 21 mg/dL (ref 6–24)
BUN: 23 mg/dL — ABNORMAL HIGH (ref 6–20)
Bilirubin Total: <0.2 mg/dL (ref 0.0–1.2)
CO2: 16 mmol/L — ABNORMAL LOW (ref 20–29)
CO2: 17 mmol/L — ABNORMAL LOW (ref 22–32)
Calcium: 9.2 mg/dL (ref 8.7–10.2)
Calcium: 9.6 mg/dL (ref 8.9–10.3)
Chloride: 107 mmol/L — ABNORMAL HIGH (ref 96–106)
Chloride: 108 mmol/L (ref 98–111)
Creatinine, Ser: 1.34 mg/dL — ABNORMAL HIGH (ref 0.57–1.00)
Creatinine, Ser: 1.55 mg/dL — ABNORMAL HIGH (ref 0.44–1.00)
GFR, Estimated: 43 mL/min — ABNORMAL LOW (ref >60)
Globulin, Total: 2.7 g/dL (ref 1.5–4.5)
Glucose, Bld: 182 mg/dL — ABNORMAL HIGH (ref 70–99)
Glucose: 137 mg/dL — ABNORMAL HIGH (ref 70–99)
Potassium: 4.3 mmol/L (ref 3.5–5.2)
Potassium: 4.2 mmol/L (ref 3.5–5.1)
Sodium: 136 mmol/L (ref 134–144)
Sodium: 136 mmol/L (ref 135–145)
Total Bilirubin: 0.6 mg/dL (ref 0.0–1.2)
Total Protein: 5.8 g/dL — ABNORMAL LOW (ref 6.0–8.5)
Total Protein: 6.2 g/dL — ABNORMAL LOW (ref 6.5–8.1)
eGFR: 51 mL/min/{1.73_m2} — ABNORMAL LOW (ref >59)

## 2023-02-08 LAB — CBC
HCT: 33.7 % — ABNORMAL LOW (ref 36.0–46.0)
Hematocrit: 33.6 % — ABNORMAL LOW (ref 34.0–46.6)
Hemoglobin: 11.3 g/dL (ref 11.1–15.9)
Hemoglobin: 11.7 g/dL — ABNORMAL LOW (ref 12.0–15.0)
MCH: 31.1 pg (ref 26.6–33.0)
MCH: 31.6 pg (ref 26.0–34.0)
MCHC: 33.6 g/dL (ref 31.5–35.7)
MCHC: 34.7 g/dL (ref 30.0–36.0)
MCV: 93 fL (ref 79–97)
MCV: 91.1 fL (ref 80.0–100.0)
Platelets: 222 10*3/uL (ref 150–450)
Platelets: 235 10*3/uL (ref 150–400)
RBC: 3.63 x10E6/uL — ABNORMAL LOW (ref 3.77–5.28)
RBC: 3.7 MIL/uL — ABNORMAL LOW (ref 3.87–5.11)
RDW: 12.5 % (ref 11.7–15.4)
RDW: 12.7 % (ref 11.5–15.5)
WBC: 9.1 10*3/uL (ref 3.4–10.8)
WBC: 10.2 10*3/uL (ref 4.0–10.5)
nRBC: 0 % (ref 0.0–0.2)

## 2023-02-08 LAB — TYPE AND SCREEN
ABO/RH(D): O POS
Antibody Screen: NEGATIVE

## 2023-02-08 IMAGING — US US MFM OB DETAIL+14 WK
1 series · 12 of 28 positions shown · non-contrast
Comparison: none

[Series 1: us mfm ob detail+14 wk · 12 of 119 slices shown]
[im 5/119]
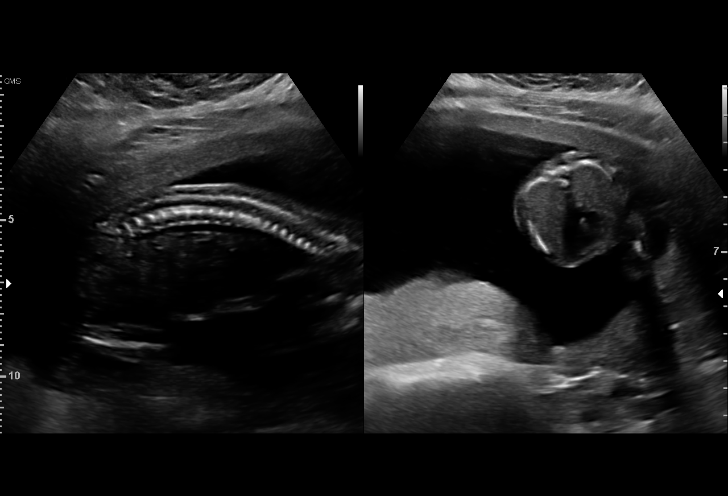
[im 14/119]
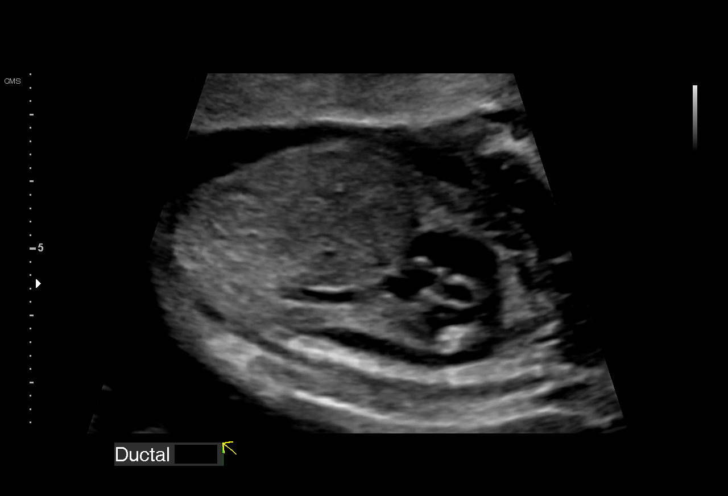
[im 22/119]
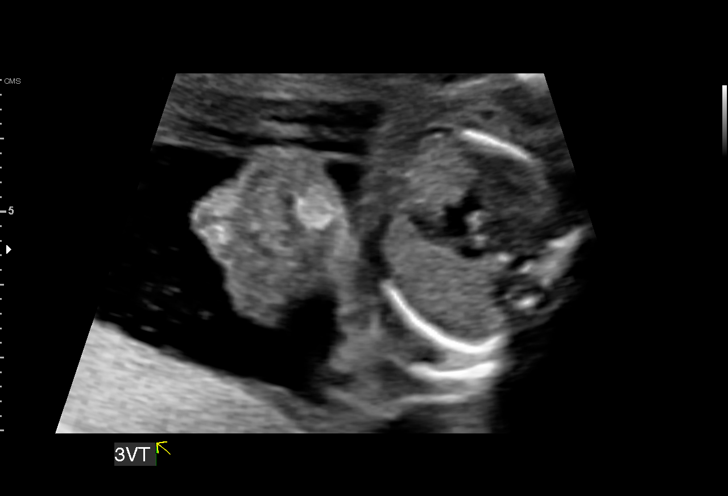
[im 35/119]
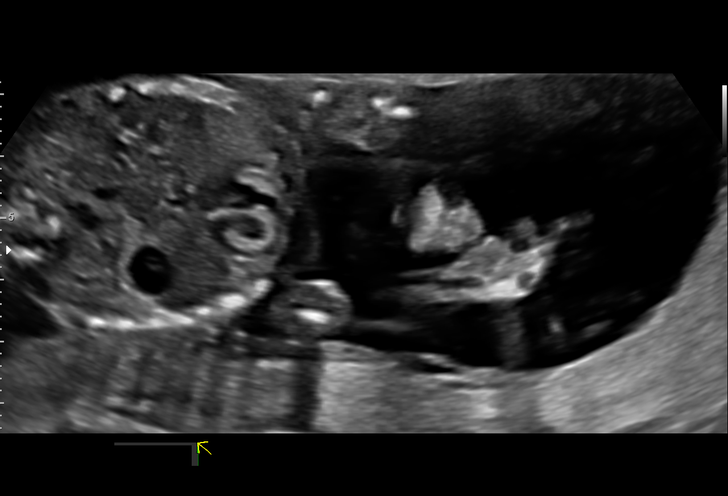
[im 44/119]
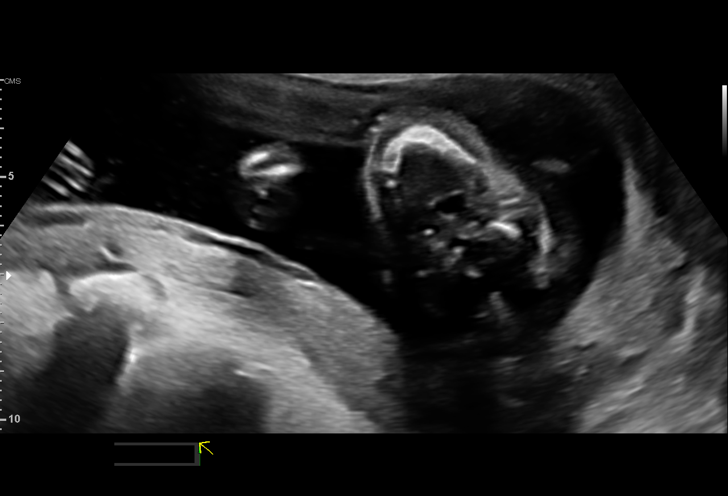
[im 53/119]
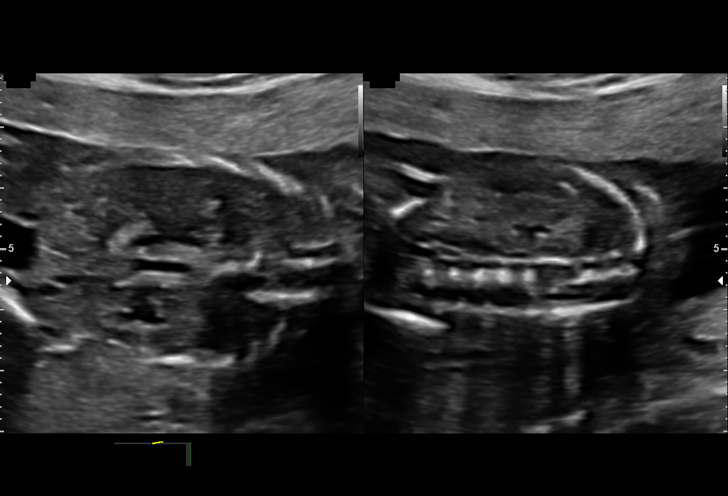
[im 66/119]
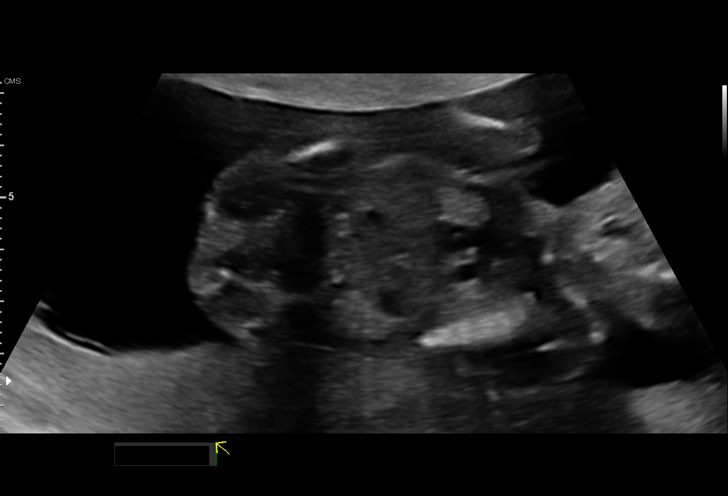
[im 75/119]
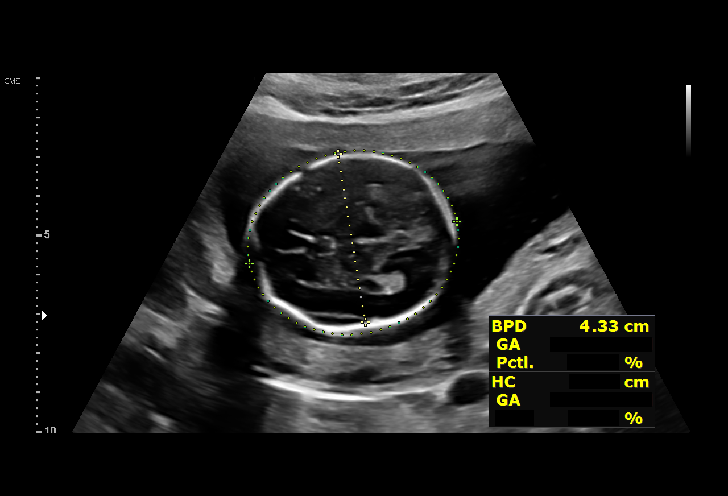
[im 84/119]
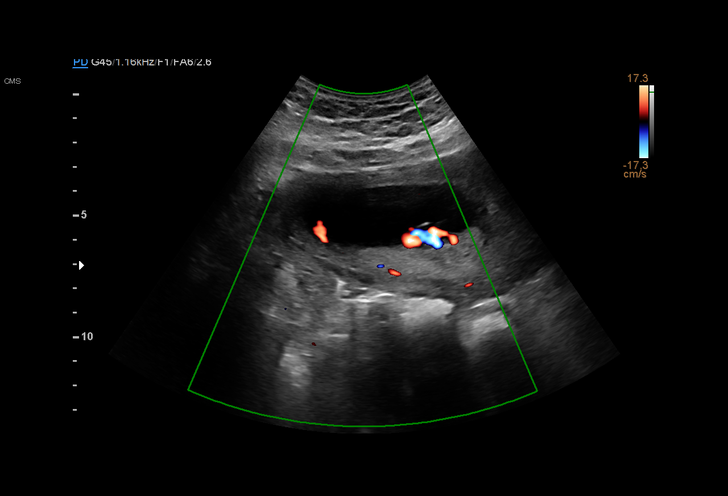
[im 97/119]
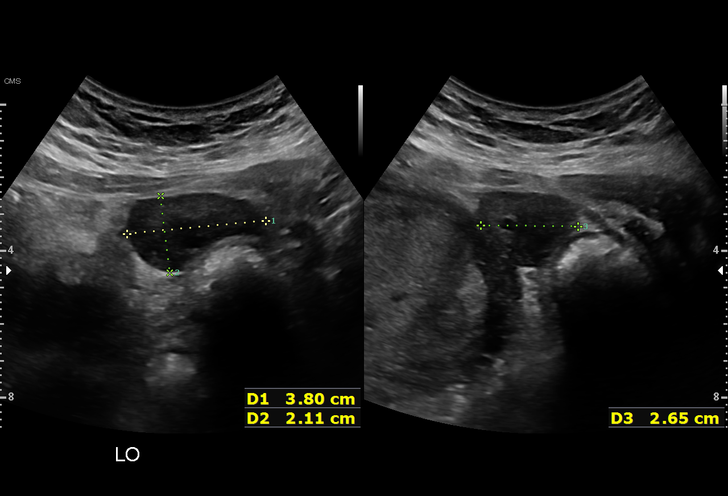
[im 105/119]
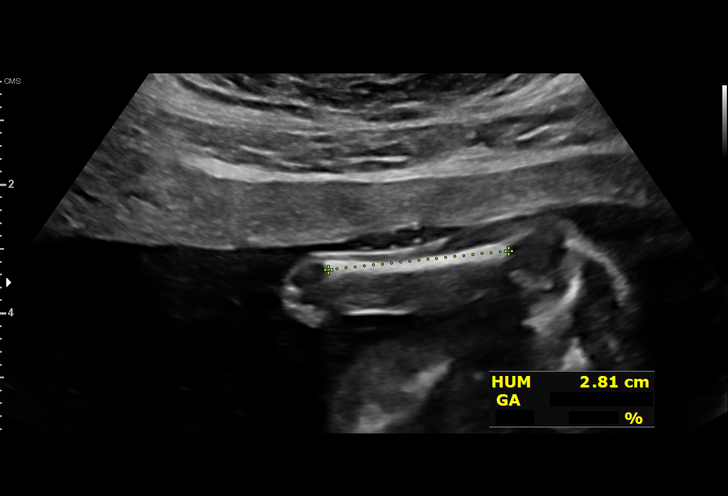
[im 114/119]
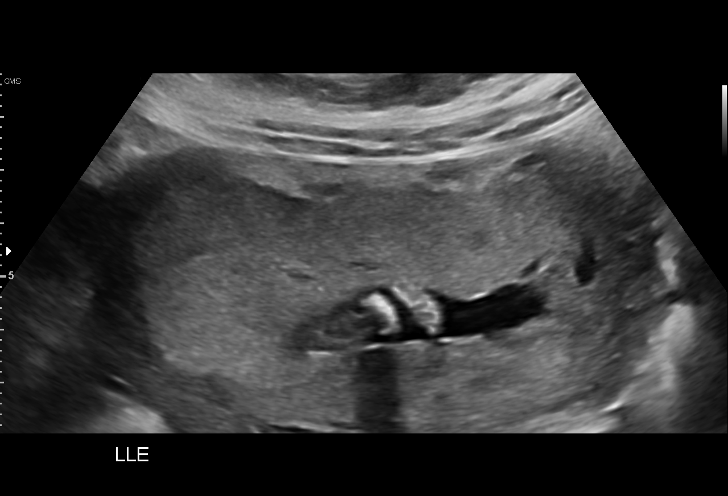

[12 of 28 positions shown; findings below may reference images not displayed]

HIROSE

Indications

 19 weeks gestation of pregnancy
 Antenatal screening for malformations
 Pre-existing diabetes, type 2, in pregnancy,
 second trimester (insulin)
 Hypothyroid (Synthroid)
 Advanced maternal age multigravida 35+,
 second trimester
 Low risk NIPS, Neg Horizon
Fetal Evaluation

 Num Of Fetuses:         1
 Fetal Heart Rate(bpm):  153
 Cardiac Activity:       Observed
 Presentation:           Cephalic
 Placenta:               Fundal Left
 P. Cord Insertion:      Marginal insertion

 Amniotic Fluid
 AFI FV:      Within normal limits

                             Largest Pocket(cm)

Biometry
 BPD:      43.3  mm     G. Age:  19w 1d         49  %    CI:        74.94   %    70 - 86
                                                         FL/HC:      18.1   %    16.1 -
 HC:      158.7  mm     G. Age:  18w 5d         23  %    HC/AC:      1.15        1.09 -
 AC:      138.3  mm     G. Age:  19w 2d         49  %    FL/BPD:     66.3   %
 FL:       28.7  mm     G. Age:  18w 6d         31  %    FL/AC:      20.8   %    20 - 24
 HUM:      27.6  mm     G. Age:  18w 6d         42  %
 CER:      18.4  mm     G. Age:  18w 1d          9  %
 NFT:       3.4  mm

 LV:        6.7  mm
 CM:        3.3  mm

 Est. FW:     269  gm      0 lb 9 oz     37  %
OB History

 Gravidity:    2         Term:   1        Prem:   0        SAB:   0
 TOP:          0       Ectopic:  0        Living: 1
Gestational Age

 LMP:           26w 1d        Date:  02/11/20                 EDD:   11/17/20
 U/S Today:     19w 0d                                        EDD:   01/06/21
 Best:          19w 1d     Det. By:  Early Ultrasound         EDD:   01/05/21
                                     (06/05/20)
Anatomy

 Cranium:               Appears normal         LVOT:                   Appears normal
 Cavum:                 Appears normal         Aortic Arch:            Appears normal
 Ventricles:            Appears normal         Ductal Arch:            Appears normal
 Choroid Plexus:        Appears normal         Diaphragm:              Appears normal
 Cerebellum:            Appears normal         Stomach:                Appears normal, left
                                                                       sided
 Posterior Fossa:       Appears normal         Abdomen:                Appears normal
 Nuchal Fold:           Appears normal         Abdominal Wall:         Appears nml (cord
                                                                       insert, abd wall)
 Face:                  Appears normal         Cord Vessels:           Appears normal (3
                        (orbits and profile)                           vessel cord)
 Lips:                  Appears normal         Kidneys:                Appear normal
 Palate:                Appears normal         Bladder:                Appears normal
 Thoracic:              Appears normal         Spine:                  Appears normal
 Heart:                 Appears normal         Upper Extremities:      Appears normal
                        (4CH, axis, and
                        situs)
 RVOT:                  Appears normal         Lower Extremities:      Appears normal
Targeted Anatomy

 Central Nervous System
 Cereb./Vermis:         Appears normal

 Head/Neck
 Nasal Bone:            Present                Mandible:               Appears normal
 Orbits/Eyes:           Nml w/ lenses          Maxilla:                Appears normal
 Thorax
 SVC:                   Appears normal         3 V Trachea View:       Appears normal
 Interventr. Septum:    Within normal limits   IVC:                    Appears normal
 3 Vessel View:         Appears normal

 Extremities
 Lt Humerus:            Appears normal         Lt Femur:               Appears normal
 Rt Humerus:            Appears normal         Rt Femur:               Appears normal
 Lt Forearm:            Appears normal         Lt Lower Leg:           Appears normal
 Rt Forearm:            Appears normal         Rt Lower Leg:           Appears normal
 Lt Hand:               Open hand nml          Lt Foot:                Nml w/ heels
 Rt Hand:               Open hand nml          Rt Foot:                Nml w/ heels

 Other
 Genitalia:             Normal Male
Cervix Uterus Adnexa

 Cervix
 Length:           3.67  cm.
 Normal appearance by transabdominal scan.

 Uterus
 No abnormality visualized.

 Right Ovary
 Within normal limits.

 Left Ovary
 Within normal limits.

 Cul De Sac
 No free fluid seen.

 Adnexa
 No abnormality visualized.
Comments

 Ms. Kaki Jim is a 39 yo G2P1 who is her for a
 detailed examination due to type 2 diabetes, today we
 observed a marginal cord insertion

 A single intrauterine pregnancy here for a detailed anatomy
 Normal anatomy with measurements consistent with dates
 There is good fetal movement and amniotic fluid volume
 Suboptimal views of the fetal anatomy were obtained
 secondary to fetal position.

 I reviewed today's study and recommend serial growth, fetal
 echocardiogram and initiation of weekly testing at 32 weeks.
 We discussed the increased risk for fetal macrosomia,
 cardiac defects, maternal and fetal birth trauma with
 temporary and/or permenant damage, stillbirth and neonatal
 ICU admission.   Ms. Roberto notes that she is taking insulin
 and that her blood sugars have been normal reporting FBS in
 87

 Marginal cord insertion is associated with fetal growth delays.
 Therefore we recommend serial growth exams.

 In addition we discussed starting daily low dose ASA for the
 prevention of preeclampsia.

 Lastly, Ms Roberto has a known history of hypothyroidism her
 most recent TSH was normal 4 weeks ago. She takes 175
 mcg daily.  Continue checking TSH every trimester if
 medication is changed consider repeat TSH in 4 weeks.

 Recommendations:

 Follow up growth in 4 weeks
 Fetal echocardiogram referral sent.

## 2023-02-08 MED ORDER — LACTATED RINGERS IV SOLN: INTRAVENOUS | Status: DC

## 2023-02-08 MED ORDER — HYDRALAZINE HCL 20 MG/ML IJ SOLN: 10.0000 mg | INTRAMUSCULAR | Status: DC | PRN

## 2023-02-08 MED ORDER — MAGNESIUM SULFATE 40 GM/1000ML IV SOLN
2.0000 g/h | INTRAVENOUS | Status: DC
  Administered 2023-02-08 – 2023-02-09 (×2): 2 g/h via INTRAVENOUS
  Filled 2023-02-08: qty 1000

## 2023-02-08 MED ORDER — LABETALOL HCL 5 MG/ML IV SOLN
20.0000 mg | INTRAVENOUS | Status: DC | PRN
  Administered 2023-02-08 – 2023-02-09 (×5): 20 mg via INTRAVENOUS
  Filled 2023-02-08 (×5): qty 4

## 2023-02-08 MED ORDER — LABETALOL HCL 5 MG/ML IV SOLN
INTRAVENOUS | Status: AC
  Filled 2023-02-08: qty 4

## 2023-02-08 MED ORDER — ACETAMINOPHEN 325 MG PO TABS: 650.0000 mg | ORAL_TABLET | ORAL | Status: DC | PRN

## 2023-02-08 MED ORDER — LABETALOL HCL 5 MG/ML IV SOLN: 40.0000 mg | INTRAVENOUS | Status: DC | PRN

## 2023-02-08 MED ORDER — OXYTOCIN BOLUS FROM INFUSION: 333.0000 mL | Freq: Once | INTRAVENOUS | Status: DC

## 2023-02-08 MED ORDER — SOD CITRATE-CITRIC ACID 500-334 MG/5ML PO SOLN
30.0000 mL | ORAL | Status: DC | PRN
  Administered 2023-02-09: 30 mL via ORAL
  Filled 2023-02-08: qty 30

## 2023-02-08 MED ORDER — FENTANYL CITRATE (PF) 100 MCG/2ML IJ SOLN: 50.0000 ug | INTRAMUSCULAR | Status: DC | PRN

## 2023-02-08 MED ORDER — MAGNESIUM SULFATE BOLUS VIA INFUSION
4.0000 g | Freq: Once | INTRAVENOUS | Status: AC
  Administered 2023-02-08: 4 g via INTRAVENOUS
  Filled 2023-02-08: qty 1000

## 2023-02-08 MED ORDER — LABETALOL HCL 5 MG/ML IV SOLN: 80.0000 mg | INTRAVENOUS | Status: DC | PRN

## 2023-02-08 MED ORDER — LIDOCAINE HCL (PF) 1 % IJ SOLN: 30.0000 mL | INTRAMUSCULAR | Status: DC | PRN

## 2023-02-08 MED ORDER — OXYTOCIN-SODIUM CHLORIDE 30-0.9 UT/500ML-% IV SOLN: 2.5000 [IU]/h | INTRAVENOUS | Status: DC

## 2023-02-08 MED ORDER — LACTATED RINGERS IV SOLN
500.0000 mL | INTRAVENOUS | Status: AC | PRN
  Administered 2023-02-09: 500 mL via INTRAVENOUS

## 2023-02-08 MED ORDER — ONDANSETRON HCL 4 MG/2ML IJ SOLN
4.0000 mg | Freq: Four times a day (QID) | INTRAMUSCULAR | Status: DC | PRN
  Administered 2023-02-09 – 2023-02-10 (×2): 4 mg via INTRAVENOUS
  Filled 2023-02-08 (×2): qty 2

## 2023-02-08 NOTE — MAU Note (Addendum)
Pt says has had H/A all day 8/10 Took 1  reg Tyl at 3 pm 5/10 Now H/A is 5/10 [facility]- clinic No vision changes or epigastric pain  BP at 7pm- at home 178/ ?  Takes BP meds - did today at 0900 .  No UC's  Says no baby movement all day- last movement was last night at 10pm - FHR- in Triage -145

## 2023-02-08 NOTE — MAU Provider Note (Incomplete)
History     CSN: [id number]  Arrival date and time: 02/08/23 2057   None     Chief Complaint  Patient presents with   Headache   Hypertension   Decreased Fetal Movement   Audrey Mcmillan is a 43 y.o. G***P*** at [redacted]w[redacted]d who receives care at ***.  She presents today for ***  {GYN/OB [id number]  Past Medical History:  Diagnosis Date   Diabetes mellitus without complication (HCC)    type 2   Gestational diabetes    Gestational hypertension, third trimester 11/14/2020   High cholesterol    Hyperthyroidism    Kidney stone complicating pregnancy, first trimester 06/06/2020   Marginal insertion of umbilical cord affecting management of mother 08/14/2020    Past Surgical History:  Procedure Laterality Date   NO PAST SURGERIES      Family History  Family history unknown: Yes    Social History   Tobacco Use   Smoking status: Never   Smokeless tobacco: Never  Vaping Use   Vaping status: Never Used  Substance Use Topics   Alcohol use: No    Comment: occ   Drug use: No    Allergies: No Known Allergies  Medications Prior to Admission  Medication Sig Dispense Refill Last Dose/Taking   aspirin 81 MG chewable tablet Chew 1 tablet (81 mg total) by mouth daily. 90 tablet 6    Blood Glucose Monitoring Suppl (TRUE METRIX METER) w/Device KIT Use as instructed 1 kit 0    glucose blood (TRUE METRIX BLOOD GLUCOSE TEST) test strip Use as instructed. Check blood glucose levels twice per day. 100 each 12    insulin glargine (LANTUS) 100 UNIT/ML Solostar Pen Inject 24 Units into the skin at bedtime. 15 mL 5    insulin lispro (HUMALOG) 100 UNIT/ML KwikPen Inject 24 Units into the skin 3 (three) times daily with meals. 15 mL 11    Insulin Pen Needle (TECHLITE PEN NEEDLES) 32G X 4 MM MISC Use to inject insulin up to 4 times daily as directed. 100 each 3    Insulin Pen Needle 32G X 4 MM MISC Use three times daily with Humalog. 100 each 11    Insulin Syringe-Needle U-100  30G X 5/16" 1 ML MISC Inject 1 application into the skin 2 times daily at 12 noon and 4 pm. 100 each 5    labetalol (NORMODYNE) 200 MG tablet Take 1 tablet (200 mg total) by mouth every 8 (eight) hours. 90 tablet 1    levothyroxine (SYNTHROID) 125 MCG tablet Take 1 tablet (125 mcg total) by mouth daily before breakfast. 42 tablet 1    NIFEdipine (PROCARDIA XL/NIFEDICAL-XL) 90 MG 24 hr tablet Take 1 tablet (90 mg total) by mouth daily. 30 tablet 1    nitrofurantoin, macrocrystal-monohydrate, (MACROBID) 100 MG capsule Take 1 capsule (100 mg total) by mouth 2 (two) times daily for 7 days. Take the second dose at night just before going to bed 14 capsule 0    Prenatal Vit-Fe Fumarate-FA (PREPLUS) 27-1 MG TABS Take 1 tablet by mouth daily. 60 tablet 2    TRUEplus Lancets 28G MISC Use as instructed. Check blood glucose levels twice per day. 100 each 3     Review of Systems Physical Exam   Blood pressure (!) 160/77, temperature 98 F (36.7 C), temperature source Oral, resp. rate 16, height 5\' 2"  (1.575 m), weight 91.6 kg.  Physical Exam  Fetal Assessment *** bpm, Mod Var, -Decels, +Accels Toco:   MAU  Course  No results found for this or any previous visit (from the past 24 hours). No results found.  MDM PE Labs: EFM  Assessment and Plan  *** G***P***  SIUP at ***weeks Cat *** FT   -Exam findings discussed. Cherre Robins MSN, CNM 02/08/2023, 9:22 PM

## 2023-02-08 NOTE — H&P (Signed)
Audrey Mcmillan is a 43 y.o. female, G3P1102 at 36.2  weeks, presenting for HA despite tylenol dosing.  Upon arrival, to MAU, patient with severe blood pressures.  Patient endorses fetal movement and denies vaginal bleeding or contractions.  Patient receives care at [facility] and was supervised for a high-risk pregnancy. Pregnancy and medical history significant for problems as listed below. She is GBS negative.  She is anticipating a female infant and requests condoms for PP birth control method.     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    BP 135/79. FHR 136. Denies headaches , 1+ edema. Reviewed signs of pre-eclampsia. Labs drawn.     NURSING  PROVIDER  Conservator, museum/gallery for Women Dating by U/S at 6 wks  [facility] Model Traditional Anatomy U/S   Initiated care at  [facility]  Spanish              LAB RESULTS   Support Person  Genetics NIPS: LR female AFP: normal    NT/IT (FT only)     Carrier Screen Horizon: 4/4 neg  Rhogam  O/Positive/-- (08/06 1204) A1C/GTT Early: 7.3 Third trimester: Type 2 DM  Flu Vaccine Declined 10/18/22    TDaP Vaccine 12/13/22  Blood Type O/Positive/-- (08/06 1204)  Covid Vaccine  Antibody Negative (08/06 1204)  RSV  Rubella 11.30 (08/06 1204)  Feeding Plan Breast RPR Non Reactive (08/06 1204)  Contraception Copper IUD HBsAg Negative (08/06 1204)  Circumcision Girl  HIV Non Reactive (08/06 1204)  Pediatrician  [facility] HCVAb Non Reactive (08/06 1204)  Prenatal Classes       Pap Diagnosis  Date Value Ref Range Status  03/08/2021   Final   - Negative for intraepithelial lesion or malignancy (NILM)    BTLConsent NA GC/CT Initial:  neg 36wks:    VBAC  Consent NA GBS   For PCN allergy, check sensitivities        DME Rx [ ]  BP cuff [ ]  Weight Scale Waterbirth  [ ]  Class [ ]  Consent [ ]  CNM visit  PHQ9 & GAD7 [  ] new OB [  ] 28 weeks  [  ] 36 weeks Induction  [ ]  Orders Entered [ ] Foley Y/N     OB History     Gravida  3   Para  2   Term  1   Preterm  1   AB  0   Living  2      SAB  0   IAB  0   Ectopic  0   Multiple  0   Live Births  2  Past Medical History:  Diagnosis Date   Diabetes mellitus without complication (HCC)    type 2   Gestational diabetes    Gestational hypertension, third trimester 11/14/2020   High cholesterol    Hyperthyroidism    Kidney stone complicating pregnancy, first trimester 06/06/2020   Marginal insertion of umbilical cord affecting management of mother 08/14/2020   Past Surgical History:  Procedure Laterality Date   NO PAST SURGERIES     Family History: Family history is unknown by patient. Social History:  reports that she has never smoked. She has never used smokeless tobacco. She reports that she does not drink alcohol and does not use drugs.   Prenatal Transfer Tool  Maternal Diabetes: Yes:  Diabetes Type:  Pre-pregnancy Genetic Screening: Normal Maternal Ultrasounds/Referrals: Normal Fetal Ultrasounds or other Referrals:  None Maternal Substance Abuse:  No Significant Maternal Medications:  Meds include: Other:  Procardia, Insulin, Synthroid, ASA Significant Maternal Lab Results: Group B Strep negative   Maternal Assessment:  ROS: -Contractions, -LOF, -Vaginal Bleeding, Decreased Fetal Movement  All other systems reviewed and negative.    No Known Allergies     Blood pressure (!) 160/77, temperature 98 F (36.7 C), temperature source Oral, resp. rate 16, height 5\' 2"  (1.575 m), weight 91.6 kg.  Physical Exam Vitals and nursing note reviewed.  Constitutional:      Appearance: She is well-developed.  HENT:     Head: Normocephalic and atraumatic.  Eyes:     Extraocular Movements: Extraocular movements intact.  Cardiovascular:     Rate and Rhythm: Normal rate.     Heart sounds: Normal heart sounds.  Pulmonary:     Effort: Pulmonary effort is normal. No respiratory distress.  Abdominal:     Palpations: Abdomen is soft.  Musculoskeletal:     Cervical back: Normal range of motion.  Neurological:     Mental Status: She is alert.  Psychiatric:        Mood and Affect: Mood normal.        Behavior: Behavior normal.     Fetal Assessment: Leopolds: Deferred -Pelvis: Proven to 2722 grams -EFW: 2192 grams 12/23 [place] -Presentation: Vertex  FHR: 145 bpm, Mod Var, -Decels, +Accels UCs:  None     Assessment IUP at 36.2 weeks Cat I FT CHTN with SI PreE DM-A2 GBS Negative S/P BMZ dosing  Plan: Consult with Dr. Katharine Look who recommends  *Admission with magnesium sulfate infusion. *Repeat labs Q8hrs  -Standard admission labs placed. *Synthroid ordered *CBGs Q 4 per protocol. -Labor provider to place induction orders as appropriate.    Joellyn Quails, MSN 02/08/2023, 9:33 PM

## 2023-02-08 NOTE — Telephone Encounter (Signed)
Preadmission screen  

## 2023-02-09 ENCOUNTER — Encounter (HOSPITAL_COMMUNITY): Payer: Self-pay | Admitting: Obstetrics & Gynecology

## 2023-02-09 ENCOUNTER — Inpatient Hospital Stay (HOSPITAL_COMMUNITY): Payer: MEDICAID | Admitting: Anesthesiology

## 2023-02-09 ENCOUNTER — Other Ambulatory Visit: Payer: Self-pay

## 2023-02-09 ENCOUNTER — Encounter (HOSPITAL_COMMUNITY)
Admission: AD | Disposition: A | Discharge status: Home / Self Care | Payer: Self-pay | Source: Home / Self Care | Attending: Obstetrics & Gynecology

## 2023-02-09 DIAGNOSIS — O1410 Severe pre-eclampsia, unspecified trimester: Secondary | ICD-10-CM | POA: Diagnosis present

## 2023-02-09 DIAGNOSIS — Z98891 History of uterine scar from previous surgery: Principal | ICD-10-CM

## 2023-02-09 DIAGNOSIS — O1414 Severe pre-eclampsia complicating childbirth: Secondary | ICD-10-CM

## 2023-02-09 DIAGNOSIS — Z3A36 36 weeks gestation of pregnancy: Secondary | ICD-10-CM

## 2023-02-09 DIAGNOSIS — Z30430 Encounter for insertion of intrauterine contraceptive device: Secondary | ICD-10-CM

## 2023-02-09 DIAGNOSIS — O2412 Pre-existing diabetes mellitus, type 2, in childbirth: Secondary | ICD-10-CM

## 2023-02-09 HISTORY — PX: IUD INSERTION: OBO1003

## 2023-02-09 LAB — GLUCOSE, CAPILLARY
Glucose-Capillary: 110 mg/dL — ABNORMAL HIGH (ref 70–99)
Glucose-Capillary: 131 mg/dL — ABNORMAL HIGH (ref 70–99)
Glucose-Capillary: 118 mg/dL — ABNORMAL HIGH (ref 70–99)
Glucose-Capillary: 111 mg/dL — ABNORMAL HIGH (ref 70–99)
Glucose-Capillary: 117 mg/dL — ABNORMAL HIGH (ref 70–99)
Glucose-Capillary: 98 mg/dL (ref 70–99)
Glucose-Capillary: 121 mg/dL — ABNORMAL HIGH (ref 70–99)
Glucose-Capillary: 178 mg/dL — ABNORMAL HIGH (ref 70–99)
Glucose-Capillary: 147 mg/dL — ABNORMAL HIGH (ref 70–99)

## 2023-02-09 LAB — CBC
HCT: 33.2 % — ABNORMAL LOW (ref 36.0–46.0)
HCT: 28.2 % — ABNORMAL LOW (ref 36.0–46.0)
HCT: 30.2 % — ABNORMAL LOW (ref 36.0–46.0)
Hemoglobin: 11.5 g/dL — ABNORMAL LOW (ref 12.0–15.0)
Hemoglobin: 10 g/dL — ABNORMAL LOW (ref 12.0–15.0)
Hemoglobin: 10.6 g/dL — ABNORMAL LOW (ref 12.0–15.0)
MCH: 31.4 pg (ref 26.0–34.0)
MCH: 32.1 pg (ref 26.0–34.0)
MCH: 31.3 pg (ref 26.0–34.0)
MCHC: 34.6 g/dL (ref 30.0–36.0)
MCHC: 35.5 g/dL (ref 30.0–36.0)
MCHC: 35.1 g/dL (ref 30.0–36.0)
MCV: 90.7 fL (ref 80.0–100.0)
MCV: 90.4 fL (ref 80.0–100.0)
MCV: 89.1 fL (ref 80.0–100.0)
Platelets: 206 10*3/uL (ref 150–400)
Platelets: 195 10*3/uL (ref 150–400)
Platelets: 200 10*3/uL (ref 150–400)
RBC: 3.66 MIL/uL — ABNORMAL LOW (ref 3.87–5.11)
RBC: 3.12 MIL/uL — ABNORMAL LOW (ref 3.87–5.11)
RBC: 3.39 MIL/uL — ABNORMAL LOW (ref 3.87–5.11)
RDW: 12.7 % (ref 11.5–15.5)
RDW: 12.6 % (ref 11.5–15.5)
RDW: 12.5 % (ref 11.5–15.5)
WBC: 10.2 10*3/uL (ref 4.0–10.5)
WBC: 14.1 10*3/uL — ABNORMAL HIGH (ref 4.0–10.5)
WBC: 15.2 10*3/uL — ABNORMAL HIGH (ref 4.0–10.5)
nRBC: 0 % (ref 0.0–0.2)
nRBC: 0 % (ref 0.0–0.2)
nRBC: 0 % (ref 0.0–0.2)

## 2023-02-09 LAB — COMPREHENSIVE METABOLIC PANEL
ALT: 24 U/L (ref 0–44)
ALT: 19 U/L (ref 0–44)
ALT: 22 U/L (ref 0–44)
AST: 24 U/L (ref 15–41)
AST: 23 U/L (ref 15–41)
AST: 24 U/L (ref 15–41)
Albumin: 2.2 g/dL — ABNORMAL LOW (ref 3.5–5.0)
Albumin: 1.9 g/dL — ABNORMAL LOW (ref 3.5–5.0)
Albumin: 2 g/dL — ABNORMAL LOW (ref 3.5–5.0)
Alkaline Phosphatase: 127 U/L — ABNORMAL HIGH (ref 38–126)
Alkaline Phosphatase: 101 U/L (ref 38–126)
Alkaline Phosphatase: 102 U/L (ref 38–126)
Anion gap: 7 (ref 5–15)
Anion gap: 7 (ref 5–15)
Anion gap: 8 (ref 5–15)
BUN: 20 mg/dL (ref 6–20)
BUN: 16 mg/dL (ref 6–20)
BUN: 15 mg/dL (ref 6–20)
CO2: 16 mmol/L — ABNORMAL LOW (ref 22–32)
CO2: 17 mmol/L — ABNORMAL LOW (ref 22–32)
CO2: 19 mmol/L — ABNORMAL LOW (ref 22–32)
Calcium: 8.7 mg/dL — ABNORMAL LOW (ref 8.9–10.3)
Calcium: 7.5 mg/dL — ABNORMAL LOW (ref 8.9–10.3)
Calcium: 7.6 mg/dL — ABNORMAL LOW (ref 8.9–10.3)
Chloride: 108 mmol/L (ref 98–111)
Chloride: 107 mmol/L (ref 98–111)
Chloride: 103 mmol/L (ref 98–111)
Creatinine, Ser: 1.29 mg/dL — ABNORMAL HIGH (ref 0.44–1.00)
Creatinine, Ser: 1.22 mg/dL — ABNORMAL HIGH (ref 0.44–1.00)
Creatinine, Ser: 1.09 mg/dL — ABNORMAL HIGH (ref 0.44–1.00)
GFR, Estimated: 53 mL/min — ABNORMAL LOW (ref >60)
GFR, Estimated: 57 mL/min — ABNORMAL LOW (ref >60)
GFR, Estimated: >60 mL/min (ref >60)
Glucose, Bld: 161 mg/dL — ABNORMAL HIGH (ref 70–99)
Glucose, Bld: 131 mg/dL — ABNORMAL HIGH (ref 70–99)
Glucose, Bld: 173 mg/dL — ABNORMAL HIGH (ref 70–99)
Potassium: 3.9 mmol/L (ref 3.5–5.1)
Potassium: 4.1 mmol/L (ref 3.5–5.1)
Potassium: 4 mmol/L (ref 3.5–5.1)
Sodium: 131 mmol/L — ABNORMAL LOW (ref 135–145)
Sodium: 131 mmol/L — ABNORMAL LOW (ref 135–145)
Sodium: 130 mmol/L — ABNORMAL LOW (ref 135–145)
Total Bilirubin: 0.3 mg/dL (ref 0.0–1.2)
Total Bilirubin: 0.6 mg/dL (ref 0.0–1.2)
Total Bilirubin: 0.6 mg/dL (ref 0.0–1.2)
Total Protein: 5.9 g/dL — ABNORMAL LOW (ref 6.5–8.1)
Total Protein: 4.9 g/dL — ABNORMAL LOW (ref 6.5–8.1)
Total Protein: 5.4 g/dL — ABNORMAL LOW (ref 6.5–8.1)

## 2023-02-09 LAB — MAGNESIUM
Magnesium: 6 mg/dL — ABNORMAL HIGH (ref 1.7–2.4)
Magnesium: 6.3 mg/dL (ref 1.7–2.4)
Magnesium: 6.2 mg/dL (ref 1.7–2.4)

## 2023-02-09 LAB — RPR: RPR Ser Ql: NONREACTIVE

## 2023-02-09 LAB — CREATININE, SERUM
Creatinine, Ser: 1.09 mg/dL — ABNORMAL HIGH (ref 0.44–1.00)
GFR, Estimated: >60 mL/min (ref >60)

## 2023-02-09 SURGERY — Surgical Case
Anesthesia: General

## 2023-02-09 MED ORDER — INSULIN GLARGINE-YFGN 100 UNIT/ML ~~LOC~~ SOLN: 20.0000 [IU] | Freq: Every day | SUBCUTANEOUS | Status: DC

## 2023-02-09 MED ORDER — TERBUTALINE SULFATE 1 MG/ML IJ SOLN: 0.2500 mg | Freq: Once | INTRAMUSCULAR | Status: DC | PRN

## 2023-02-09 MED ORDER — DEXTROSE 50 % IV SOLN
0.0000 mL | INTRAVENOUS | Status: DC | PRN
Start: 2023-02-09 — End: 2023-02-12

## 2023-02-09 MED ORDER — PHENYLEPHRINE HCL-NACL 20-0.9 MG/250ML-% IV SOLN
INTRAVENOUS | Status: DC | PRN
  Administered 2023-02-09: 60 ug/min via INTRAVENOUS

## 2023-02-09 MED ORDER — ACETAMINOPHEN 10 MG/ML IV SOLN
INTRAVENOUS | Status: DC | PRN
  Administered 2023-02-09: 1000 mg via INTRAVENOUS

## 2023-02-09 MED ORDER — WITCH HAZEL-GLYCERIN EX PADS: 1.0000 | MEDICATED_PAD | CUTANEOUS | Status: DC | PRN

## 2023-02-09 MED ORDER — INSULIN REGULAR(HUMAN) IN NACL 100-0.9 UT/100ML-% IV SOLN
INTRAVENOUS | Status: DC
  Administered 2023-02-09: 2.6 [IU]/h via INTRAVENOUS
  Filled 2023-02-09: qty 100

## 2023-02-09 MED ORDER — INSULIN ASPART 100 UNIT/ML IJ SOLN
0.0000 [IU] | Freq: Three times a day (TID) | INTRAMUSCULAR | Status: DC
  Administered 2023-02-10: 5 [IU] via SUBCUTANEOUS
  Administered 2023-02-10: 3 [IU] via SUBCUTANEOUS
  Administered 2023-02-10: 2 [IU] via SUBCUTANEOUS
  Administered 2023-02-10: 5 [IU] via SUBCUTANEOUS
  Administered 2023-02-11: 3 [IU] via SUBCUTANEOUS
  Administered 2023-02-11: 2 [IU] via SUBCUTANEOUS

## 2023-02-09 MED ORDER — MORPHINE SULFATE (PF) 0.5 MG/ML IJ SOLN
INTRAMUSCULAR | Status: DC | PRN
  Administered 2023-02-09: 150 ug via INTRATHECAL

## 2023-02-09 MED ORDER — INSULIN GLARGINE-YFGN 100 UNIT/ML ~~LOC~~ SOLN
10.0000 [IU] | Freq: Every day | SUBCUTANEOUS | Status: DC
  Administered 2023-02-09 – 2023-02-10 (×2): 10 [IU] via SUBCUTANEOUS
  Filled 2023-02-09 (×2): qty 0.1

## 2023-02-09 MED ORDER — ACETAMINOPHEN 10 MG/ML IV SOLN
INTRAVENOUS | Status: AC
  Filled 2023-02-09: qty 100

## 2023-02-09 MED ORDER — POVIDONE-IODINE 10 % EX SWAB: 2.0000 | Freq: Once | CUTANEOUS | Status: DC

## 2023-02-09 MED ORDER — COCONUT OIL OIL: 1.0000 | TOPICAL_OIL | Status: DC | PRN

## 2023-02-09 MED ORDER — SENNOSIDES-DOCUSATE SODIUM 8.6-50 MG PO TABS
2.0000 | ORAL_TABLET | Freq: Every day | ORAL | Status: DC
  Administered 2023-02-10 – 2023-02-12 (×3): 2 via ORAL
  Filled 2023-02-09 (×3): qty 2

## 2023-02-09 MED ORDER — OXYTOCIN-SODIUM CHLORIDE 30-0.9 UT/500ML-% IV SOLN
1.0000 m[IU]/min | INTRAVENOUS | Status: DC
Start: 2023-02-09 — End: 2023-02-12
  Administered 2023-02-09: 2 m[IU]/min via INTRAVENOUS
  Filled 2023-02-09: qty 1000

## 2023-02-09 MED ORDER — METOCLOPRAMIDE HCL 5 MG/ML IJ SOLN
10.0000 mg | Freq: Once | INTRAMUSCULAR | Status: AC
  Administered 2023-02-09: 10 mg via INTRAVENOUS
  Filled 2023-02-09: qty 2

## 2023-02-09 MED ORDER — MAGNESIUM HYDROXIDE 400 MG/5ML PO SUSP: 30.0000 mL | ORAL | Status: DC | PRN

## 2023-02-09 MED ORDER — PARAGARD INTRAUTERINE COPPER IU IUD: 1.0000 | INTRAUTERINE_SYSTEM | Freq: Once | INTRAUTERINE | Status: DC

## 2023-02-09 MED ORDER — FENTANYL CITRATE (PF) 100 MCG/2ML IJ SOLN
INTRAMUSCULAR | Status: AC
  Filled 2023-02-09: qty 2

## 2023-02-09 MED ORDER — DIPHENHYDRAMINE HCL 25 MG PO CAPS: 25.0000 mg | ORAL_CAPSULE | Freq: Four times a day (QID) | ORAL | Status: DC | PRN

## 2023-02-09 MED ORDER — PROMETHAZINE (PHENERGAN) 6.25MG IN NS 50ML IVPB
6.2500 mg | Freq: Once | INTRAVENOUS | Status: AC
  Administered 2023-02-09: 6.25 mg via INTRAVENOUS
  Filled 2023-02-09: qty 6.25

## 2023-02-09 MED ORDER — ONDANSETRON HCL 4 MG/2ML IJ SOLN
INTRAMUSCULAR | Status: DC | PRN
  Administered 2023-02-09 (×2): 4 mg via INTRAVENOUS

## 2023-02-09 MED ORDER — OXYTOCIN-SODIUM CHLORIDE 30-0.9 UT/500ML-% IV SOLN
2.5000 [IU]/h | INTRAVENOUS | Status: AC
Start: 2023-02-09 — End: 2023-02-10
  Administered 2023-02-09: 2.5 [IU]/h via INTRAVENOUS

## 2023-02-09 MED ORDER — GABAPENTIN 100 MG PO CAPS
200.0000 mg | ORAL_CAPSULE | Freq: Every day | ORAL | Status: DC
  Administered 2023-02-09 – 2023-02-11 (×3): 200 mg via ORAL
  Filled 2023-02-09 (×3): qty 2

## 2023-02-09 MED ORDER — DEXTROSE IN LACTATED RINGERS 5 % IV SOLN: INTRAVENOUS | Status: DC

## 2023-02-09 MED ORDER — BUPIVACAINE IN DEXTROSE 0.75-8.25 % IT SOLN
INTRATHECAL | Status: DC | PRN
  Administered 2023-02-09: 12 mg via INTRATHECAL

## 2023-02-09 MED ORDER — SODIUM CHLORIDE 0.9 % IV SOLN
500.0000 mg | INTRAVENOUS | Status: AC
  Administered 2023-02-09: 500 mg via INTRAVENOUS

## 2023-02-09 MED ORDER — CEFAZOLIN SODIUM-DEXTROSE 2-4 GM/100ML-% IV SOLN
2.0000 g | INTRAVENOUS | Status: AC
  Administered 2023-02-09: 2 g via INTRAVENOUS

## 2023-02-09 MED ORDER — OXYCODONE HCL 5 MG PO TABS
5.0000 mg | ORAL_TABLET | Freq: Four times a day (QID) | ORAL | Status: DC | PRN
  Administered 2023-02-11: 5 mg via ORAL
  Filled 2023-02-09: qty 1

## 2023-02-09 MED ORDER — LEVOTHYROXINE SODIUM 25 MCG PO TABS: 125.0000 ug | ORAL_TABLET | Freq: Every day | ORAL | Status: DC

## 2023-02-09 MED ORDER — MENTHOL 3 MG MT LOZG: 1.0000 | LOZENGE | OROMUCOSAL | Status: DC | PRN

## 2023-02-09 MED ORDER — MAGNESIUM SULFATE 40 GM/1000ML IV SOLN
1.0000 g/h | INTRAVENOUS | Status: AC
  Administered 2023-02-09: 1 g/h via INTRAVENOUS

## 2023-02-09 MED ORDER — LEVOTHYROXINE SODIUM 25 MCG PO TABS
125.0000 ug | ORAL_TABLET | Freq: Every day | ORAL | Status: DC
  Administered 2023-02-09 – 2023-02-12 (×4): 125 ug via ORAL
  Filled 2023-02-09: qty 1
  Filled 2023-02-09 (×3): qty 5

## 2023-02-09 MED ORDER — LEVONORGESTREL 20 MCG/DAY IU IUD
INTRAUTERINE_SYSTEM | INTRAUTERINE | Status: AC
  Filled 2023-02-09: qty 1

## 2023-02-09 MED ORDER — ONDANSETRON HCL 4 MG/2ML IJ SOLN
INTRAMUSCULAR | Status: AC
  Filled 2023-02-09: qty 2

## 2023-02-09 MED ORDER — DIPHENHYDRAMINE HCL 50 MG/ML IJ SOLN
INTRAMUSCULAR | Status: DC | PRN
  Administered 2023-02-09: 12.5 mg via INTRAVENOUS

## 2023-02-09 MED ORDER — PRENATAL MULTIVITAMIN CH
1.0000 | ORAL_TABLET | Freq: Every day | ORAL | Status: DC
  Administered 2023-02-10 – 2023-02-12 (×3): 1 via ORAL
  Filled 2023-02-09 (×3): qty 1

## 2023-02-09 MED ORDER — NIFEDIPINE ER OSMOTIC RELEASE 60 MG PO TB24
90.0000 mg | ORAL_TABLET | Freq: Every day | ORAL | Status: DC
  Administered 2023-02-09 – 2023-02-12 (×4): 90 mg via ORAL
  Filled 2023-02-09 (×4): qty 1

## 2023-02-09 MED ORDER — EPHEDRINE SULFATE-NACL 50-0.9 MG/10ML-% IV SOSY
PREFILLED_SYRINGE | INTRAVENOUS | Status: DC | PRN
  Administered 2023-02-09: 10 mg via INTRAVENOUS

## 2023-02-09 MED ORDER — LACTATED RINGERS IV BOLUS: 500.0000 mL | Freq: Once | INTRAVENOUS | Status: DC

## 2023-02-09 MED ORDER — FENTANYL CITRATE (PF) 100 MCG/2ML IJ SOLN
INTRAMUSCULAR | Status: DC | PRN
  Administered 2023-02-09: 15 ug via INTRATHECAL

## 2023-02-09 MED ORDER — LACTATED RINGERS IV SOLN: INTRAVENOUS | Status: DC

## 2023-02-09 MED ORDER — MAGNESIUM SULFATE 40 GM/1000ML IV SOLN
INTRAVENOUS | Status: AC
  Filled 2023-02-09: qty 1000

## 2023-02-09 MED ORDER — EPHEDRINE 5 MG/ML INJ
INTRAVENOUS | Status: AC
  Filled 2023-02-09: qty 5

## 2023-02-09 MED ORDER — SIMETHICONE 80 MG PO CHEW
80.0000 mg | CHEWABLE_TABLET | Freq: Three times a day (TID) | ORAL | Status: DC
  Administered 2023-02-10 – 2023-02-12 (×7): 80 mg via ORAL
  Filled 2023-02-09 (×7): qty 1

## 2023-02-09 MED ORDER — MISOPROSTOL 50MCG HALF TABLET
50.0000 ug | ORAL_TABLET | Freq: Once | ORAL | Status: AC
  Administered 2023-02-09: 50 ug via ORAL
  Filled 2023-02-09: qty 1

## 2023-02-09 MED ORDER — DIBUCAINE (PERIANAL) 1 % EX OINT: 1.0000 | TOPICAL_OINTMENT | CUTANEOUS | Status: DC | PRN

## 2023-02-09 MED ORDER — DIPHENHYDRAMINE HCL 50 MG/ML IJ SOLN
INTRAMUSCULAR | Status: AC
Start: 2023-02-09 — End: ?
  Filled 2023-02-09: qty 1

## 2023-02-09 MED ORDER — TRANEXAMIC ACID-NACL 1000-0.7 MG/100ML-% IV SOLN
1000.0000 mg | Freq: Once | INTRAVENOUS | Status: AC
  Administered 2023-02-09: 1000 mg via INTRAVENOUS

## 2023-02-09 MED ORDER — ENOXAPARIN SODIUM 40 MG/0.4ML IJ SOSY
40.0000 mg | PREFILLED_SYRINGE | INTRAMUSCULAR | Status: DC
  Administered 2023-02-10 – 2023-02-12 (×3): 40 mg via SUBCUTANEOUS
  Filled 2023-02-09 (×3): qty 0.4

## 2023-02-09 MED ORDER — KETOROLAC TROMETHAMINE 30 MG/ML IJ SOLN
30.0000 mg | Freq: Four times a day (QID) | INTRAMUSCULAR | Status: AC
Start: 2023-02-09 — End: 2023-02-10
  Administered 2023-02-09 – 2023-02-10 (×3): 30 mg via INTRAVENOUS
  Filled 2023-02-09 (×4): qty 1

## 2023-02-09 MED ORDER — LABETALOL HCL 200 MG PO TABS
200.0000 mg | ORAL_TABLET | Freq: Three times a day (TID) | ORAL | Status: DC
  Administered 2023-02-09: 200 mg via ORAL
  Filled 2023-02-09 (×2): qty 1

## 2023-02-09 MED ORDER — MISOPROSTOL 25 MCG QUARTER TABLET
25.0000 ug | ORAL_TABLET | Freq: Once | ORAL | Status: AC
  Administered 2023-02-09: 25 ug via VAGINAL
  Filled 2023-02-09: qty 1

## 2023-02-09 MED ORDER — ACETAMINOPHEN 500 MG PO TABS
1000.0000 mg | ORAL_TABLET | Freq: Four times a day (QID) | ORAL | Status: DC
  Administered 2023-02-09 – 2023-02-12 (×11): 1000 mg via ORAL
  Filled 2023-02-09 (×12): qty 2

## 2023-02-09 MED ORDER — SIMETHICONE 80 MG PO CHEW: 80.0000 mg | CHEWABLE_TABLET | ORAL | Status: DC | PRN

## 2023-02-09 MED ORDER — LACTATED RINGERS IV SOLN
INTRAVENOUS | Status: AC
Start: 2023-02-09 — End: 2023-02-10

## 2023-02-09 MED ORDER — MORPHINE SULFATE (PF) 0.5 MG/ML IJ SOLN
INTRAMUSCULAR | Status: AC
  Filled 2023-02-09: qty 10

## 2023-02-09 MED ORDER — OXYTOCIN-SODIUM CHLORIDE 30-0.9 UT/500ML-% IV SOLN
INTRAVENOUS | Status: DC | PRN
  Administered 2023-02-09: 30 [IU] via INTRAVENOUS

## 2023-02-09 MED ORDER — IBUPROFEN 600 MG PO TABS: 600.0000 mg | ORAL_TABLET | Freq: Four times a day (QID) | ORAL | Status: DC

## 2023-02-09 MED ORDER — FUROSEMIDE 20 MG PO TABS
20.0000 mg | ORAL_TABLET | Freq: Every day | ORAL | Status: DC
  Administered 2023-02-09 – 2023-02-12 (×4): 20 mg via ORAL
  Filled 2023-02-09 (×4): qty 1

## 2023-02-09 MED ORDER — OXYTOCIN-SODIUM CHLORIDE 30-0.9 UT/500ML-% IV SOLN
INTRAVENOUS | Status: AC
Start: 2023-02-09 — End: ?
  Filled 2023-02-09: qty 500

## 2023-02-09 SURGICAL SUPPLY — 33 items
BARRIER ADHS 3X4 INTERCEED (GAUZE/BANDAGES/DRESSINGS) IMPLANT
BENZOIN TINCTURE PRP APPL 2/3 (GAUZE/BANDAGES/DRESSINGS) IMPLANT
CHLORAPREP W/TINT 26 (MISCELLANEOUS) ×4 IMPLANT
CLAMP UMBILICAL CORD (MISCELLANEOUS) ×2 IMPLANT
CLOTH BEACON ORANGE TIMEOUT ST (SAFETY) ×2 IMPLANT
DRSG OPSITE POSTOP 4X10 (GAUZE/BANDAGES/DRESSINGS) ×2 IMPLANT
ELECT REM PT RETURN 9FT ADLT (ELECTROSURGICAL) ×1 IMPLANT
ELECTRODE REM PT RTRN 9FT ADLT (ELECTROSURGICAL) ×2 IMPLANT
EXTRACTOR VACUUM KIWI (MISCELLANEOUS) IMPLANT
GAUZE SPONGE 4X4 12PLY STRL LF (GAUZE/BANDAGES/DRESSINGS) IMPLANT
GLOVE BIO SURGEON STRL SZ 6.5 (GLOVE) ×2 IMPLANT
GLOVE BIOGEL PI IND STRL 7.0 (GLOVE) ×4 IMPLANT
GOWN STRL REUS W/TWL LRG LVL3 (GOWN DISPOSABLE) ×4 IMPLANT
KIT ABG SYR 3ML LUER SLIP (SYRINGE) IMPLANT
Mirena IMPLANT
NDL HYPO 25X5/8 SAFETYGLIDE (NEEDLE) IMPLANT
NEEDLE HYPO 22GX1.5 SAFETY (NEEDLE) IMPLANT
NEEDLE HYPO 25X5/8 SAFETYGLIDE (NEEDLE) IMPLANT
NS IRRIG 1000ML POUR BTL (IV SOLUTION) ×2 IMPLANT
PACK C SECTION WH (CUSTOM PROCEDURE TRAY) ×2 IMPLANT
PAD OB MATERNITY 4.3X12.25 (PERSONAL CARE ITEMS) ×2 IMPLANT
RETRACTOR WND ALEXIS 25 LRG (MISCELLANEOUS) IMPLANT
RETRACTOR WOUND ALXS 25CM LRG (MISCELLANEOUS) IMPLANT
RTRCTR WOUND ALEXIS 25CM LRG (MISCELLANEOUS) IMPLANT
SUT PLAIN ABS 2-0 CT1 27XMFL (SUTURE) IMPLANT
SUT VIC AB 0 CT1 36 (SUTURE) ×12 IMPLANT
SUT VIC AB 2-0 CT1 TAPERPNT 27 (SUTURE) ×2 IMPLANT
SUT VIC AB 4-0 PS2 27 (SUTURE) ×2 IMPLANT
SYR CONTROL 10ML LL (SYRINGE) IMPLANT
TAPE CLOTH SURG 4X10 WHT LF (GAUZE/BANDAGES/DRESSINGS) IMPLANT
TOWEL OR 17X24 6PK STRL BLUE (TOWEL DISPOSABLE) ×2 IMPLANT
TRAY FOLEY W/BAG SLVR 14FR LF (SET/KITS/TRAYS/PACK) IMPLANT
WATER STERILE IRR 1000ML POUR (IV SOLUTION) ×2 IMPLANT

## 2023-02-09 NOTE — Anesthesia Postprocedure Evaluation (Signed)
Anesthesia Post Note  Patient: Audrey Mcmillan  Procedure(s) Performed: CESAREAN SECTION     Patient location during evaluation: Mother Baby Anesthesia Type: Spinal Level of consciousness: oriented and awake and alert Pain management: pain level controlled Vital Signs Assessment: post-procedure vital signs reviewed and stable Respiratory status: spontaneous breathing and respiratory function stable Cardiovascular status: blood pressure returned to baseline and stable Postop Assessment: no headache, no backache, no apparent nausea or vomiting and able to ambulate Anesthetic complications: no   No notable events documented.  Last Vitals:  Vitals:   02/09/23 2257 02/09/23 2342  BP: 134/79   Pulse: 64   Resp: 18 17  Temp: (!) 36.4 C 36.6 C  SpO2:      Last Pain:  Vitals:   02/09/23 2342  TempSrc: Oral  PainSc:    Pain Goal:                   Trevor Iha

## 2023-02-09 NOTE — Inpatient Diabetes Management (Signed)
Inpatient Diabetes Program Recommendations  Diabetes Treatment Program Recommendations  ADA Standards of Care Diabetes in Pregnancy Target Glucose Ranges:  Fasting: 70 - 95 mg/dL 1 hr postprandial: Less than 140mg /dL (from first bite of meal) 2 hr postprandial: Less than 120 mg/dL (from first bite of meal)      Latest Reference Range & Units 02/09/23 04:09 02/09/23 07:36  Glucose-Capillary 70 - 99 mg/dL 563 (H) 875 (H)    Latest Reference Range & Units 02/08/23 22:01 02/09/23 05:20  Glucose 70 - 99 mg/dL 643 (H) 329 (H)   Review of Glycemic Control  Diabetes history: DM2 Outpatient Diabetes medications: Lantus 24 units at bedtime, Humalog 24 units TID with meals; prior to pregnancy Basaglar 15 units daily, Jardiance 10 mg daily, Trulicity 1.5 mg Qweek Current orders for Inpatient glycemic control: IV insulin  Inpatient Diabetes Program Recommendations:    Insulin: Agree with using IV insulin for glycemic control during labor. Following delivery when provider is ready to transition off IV insulin, please consider ordering Semglee 10  units Q24H , CBGs AC&HS, Novolog 0-15 units TID with meals, and Novolog 0-5 units at bedtime.  Thanks, Orlando Penner, RN, MSN, CDCES Diabetes Coordinator Inpatient Diabetes Program [phone] (Team Pager from 8am to 5pm)

## 2023-02-09 NOTE — Lactation Note (Signed)
This note was copied from a baby's chart. Lactation Consultation Note  Patient Name: Audrey Mcmillan [id number] Date: 02/09/2023 Age:42 hours   P3- LC called to OB speciality care to confirm whether or not MOB wants lactation services because her chart says no. The RN reported that they do not know yet because MOB has been in and out of sleep.  Feeding Nipple Type: Nfant Standard Flow (white)  Dema Severin BS, IBCLC 02/09/2023, 8:45 PM

## 2023-02-09 NOTE — Progress Notes (Signed)
Labor Progress Note Audrey Mcmillan is a 43 y.o. [phone] at [redacted]w[redacted]d presented for IOL due to cHTN< now with SIPE w SF, hx poorly controlled T2DM  S: Feels ctx getting stronger at this time.  O:  BP (!) 162/101   Pulse 76   Temp 97.9 F (36.6 C) (Oral)   Resp 16   Ht 5\' 2"  (1.575 m)   Wt 91.6 kg   LMP  (LMP Unknown)   SpO2 99%   BMI 36.93 kg/m  EFM: 125/mod/-a/+late decels  CVE: Dilation: 3.5 Effacement (%): 50 Cervical Position: Posterior Station: -3 Presentation: Vertex Exam by:: Zabrina Brotherton   A&P: 43 y.o. [id number] [redacted]w[redacted]d here for IOL due to SIPE on cHTN w SF, on mag, and poorly controlled T2DM on Endotool #Labor: Cx unchanged, given persistent cat II strip, now with decreasing variability and inability to augment labor, discussed cesarean delivery w pt in detail, she consents to C/S  Audrey Mcmillan has agreed to proceed with cesarean section due to Non-reassuring FHR. The risks of surgery were discussed with the patient including but were not limited to: bleeding which may require transfusion or reoperation; infection which may require antibiotics; injury to bowel, bladder, ureters or other surrounding organs; injury to the fetus; need for additional procedures including hysterectomy in the event of a life-threatening hemorrhage; formation of adhesions; placental abnormalities wth subsequent pregnancies; incisional problems; thromboembolic phenomenon and other postoperative/anesthesia complications. The patient concurred with the proposed plan, giving informed written consent for the procedure. Patient will remain NPO for procedure. Anesthesia and OR aware. Preoperative prophylactic antibiotics and SCDs ordered on call to the OR. To OR when ready.    #Pain: Per pt request #FWB: Cat II, but has mod variability so reassuring #GBS negative on 01/11/23  #SIPE on cHTN w SF: On Mag  cont home Labetalol and Procardia  Labetalol protocol  seizure  precautions  #Uncontrolled T2DM: Suspect elevated PCR partially secondary to diabetic nephropathy at baseline  BL Cr appears to be 1.0-1.2  on Endotool now  #Hypothyroidism: Cont home Synthroid  #AMA  [name], MD 1:47 PM

## 2023-02-09 NOTE — Progress Notes (Signed)
Labor Progress Note Audrey Mcmillan is a 43 y.o. [phone] at [redacted]w[redacted]d presented for IOL for CHTN w/SIPE S: Sleeping  O:  BP (!) 167/83   Pulse 79   Temp 97.9 F (36.6 C) (Oral)   Resp 16   Ht 5\' 2"  (1.575 m)   Wt 91.6 kg   LMP  (LMP Unknown)   SpO2 99%   BMI 36.93 kg/m   EFM: baseline 125, no accels, no decels, moderate variability TOCO: q2-76min contractions  CVE: Dilation: 2 Effacement (%): Thick Cervical Position: Posterior Station: Ballotable Presentation: Vertex Exam by:: Dr. Leanora Cover   A&P: 43 y.o. G3O7564 [redacted]w[redacted]d admitted for IOL  #Labor: Minimal cervical change after dual cytotec. Periods of low variability and intermittent shallow lates. Unable to place FB due to posterior cervical position and very high ballotable baby. Will trial pitocin to bring baby down. #Pain: Minimal #FWB: Cat II #GBS negative (12/20)  #CHTN w/SIPE - continue q8h labs; stable since admission - s/p 2 doses 20mg  IV labetalol - s/p Mg bolus; continue maintenance Mg until 24hrs postpartum; serum Mg 6.0  #T2DM - CBG q4hr; persistently elevated so will start endotool   Wyn Forster, MD 7:17 AM

## 2023-02-09 NOTE — Progress Notes (Signed)
Patient ID: Audrey Mcmillan, female   DOB: 1980-03-22, 43 y.o.   MRN: [id number] Due to non-reassuring tracing after discussion with her nurses we will not proceed with plan to augment labor with pitocin and will do a cesarean section for fetal intolerance. The risks of surgery were discussed with the patient including but were not limited to: bleeding which may require transfusion or reoperation; infection which may require antibiotics; injury to bowel, bladder, ureters or other surrounding organs; injury to the fetus; need for additional procedures including hysterectomy in the event of a life-threatening hemorrhage; formation of adhesions; placental abnormalities with subsequent pregnancies; incisional problems; thromboembolic phenomenon and other postoperative/anesthesia complications.  The patient concurred with the proposed plan, giving informed written consent for the procedure.   Patient has been NPO since yesterday she will remain NPO for procedure. Anesthesia and OR aware. Preoperative prophylactic antibiotics and SCDs ordered on call to the OR.  To OR when ready.  Adam Phenix, MD

## 2023-02-09 NOTE — Progress Notes (Signed)
Labor Progress Note Audrey Mcmillan is a 43 y.o. [phone] at [redacted]w[redacted]d presented for IOL due to cHTN< now with SIPE w SF, hx poorly controlled T2DM  S: No concerns at this time.  O:  BP 126/72   Pulse 69   Temp 97.9 F (36.6 C) (Oral)   Resp 16   Ht 5\' 2"  (1.575 m)   Wt 91.6 kg   LMP  (LMP Unknown)   SpO2 99%   BMI 36.93 kg/m  EFM: 125/mod/-a/+late decels  CVE: Dilation: 3.5 Effacement (%): 50 Cervical Position: Posterior Station: -3 Presentation: Vertex Exam by:: Brean Carberry   A&P: 43 y.o. A5W0981 [redacted]w[redacted]d here for IOL due to SIPE on cHTN w SF, on mag, and poorly controlled T2DM on Endotool #Labor: Continuing to progress. Introduced role of AROM with patient and she verbally consented. AROM performed w moderate amount of clear fluid, IUPC placed to better assess ctx pattern. #Pain: Per pt reqeust #FWB: Cat II, but has mod variability so reassuring #GBS negative on 01/11/23  #SIPE on cHTN w SF: On Mag  cont home Labetalol and Procardia  Labetalol protocol  seizure precautions  #Uncontrolled T2DM: Suspect elevated PCR partially secondary to diabetic nephropathy at baseline  BL Cr appears to be 1.0-1.2  on Endotool now  #Hypothyroidism: Cont home Synthroid  #AMA  [name], MD 12:03 PM

## 2023-02-09 NOTE — Anesthesia Preprocedure Evaluation (Addendum)
Anesthesia Evaluation  Patient identified by MRN, date of birth, ID band Patient awake    Reviewed: Allergy & Precautions, NPO status , Patient's Chart, lab work & pertinent test results  Airway Mallampati: II  TM Distance: >3 FB Neck ROM: Full    Dental no notable dental hx.    Pulmonary neg pulmonary ROS   Pulmonary exam normal breath sounds clear to auscultation       Cardiovascular hypertension, Pt. on medications Normal cardiovascular exam Rhythm:Regular Rate:Normal     Neuro/Psych  Neuromuscular disease    GI/Hepatic negative GI ROS, Neg liver ROS,,,  Endo/Other  diabetes    Renal/GU negative Renal ROS     Musculoskeletal negative musculoskeletal ROS (+)    Abdominal   Peds  Hematology Lab Results      Component                Value               Date                      WBC                      10.2                02/09/2023                HGB                      11.5 (L)            02/09/2023                HCT                      33.2 (L)            02/09/2023                MCV                      90.7                02/09/2023                PLT                      206                 02/09/2023              Anesthesia Other Findings   Reproductive/Obstetrics negative OB ROS                             Anesthesia Physical Anesthesia Plan  ASA: 3  Anesthesia Plan: Spinal   Post-op Pain Management: Minimal or no pain anticipated and Regional block*   Induction: Intravenous  PONV Risk Score and Plan: 3 and Treatment may vary due to age or medical condition and Ondansetron  Airway Management Planned:   Additional Equipment:   Intra-op Plan:   Post-operative Plan: Extubation in OR  Informed Consent: I have reviewed the patients History and Physical, chart, labs and discussed the procedure including the risks, benefits and alternatives for the proposed anesthesia  with the patient or authorized representative who has indicated his/her understanding and acceptance.  Dental advisory given  Plan Discussed with: CRNA  Anesthesia Plan Comments: (36.3 wk G3P2 w Pre on Mg and gdM w non reassuring FHT for primary c/s under spinal)        Anesthesia Quick Evaluation

## 2023-02-09 NOTE — Transfer of Care (Signed)
Immediate Anesthesia Transfer of Care Note  Patient: Audrey Mcmillan  Procedure(s) Performed: CESAREAN SECTION  Patient Location: PACU  Anesthesia Type:Spinal  Level of Consciousness: awake and alert   Airway & Oxygen Therapy: Patient Spontanous Breathing  Post-op Assessment: Report given to RN and Post -op Vital signs reviewed and stable  Post vital signs: Reviewed and stable  Last Vitals:  Vitals Value Taken Time  BP 155/83 02/09/23 1905  Temp 36.7 C 02/09/23 2046  Pulse 64 02/09/23 1905  Resp 14 02/09/23 1905  SpO2 95 % 02/09/23 2059  Vitals shown include unfiled device data.  Last Pain:  Vitals:   02/09/23 2046  TempSrc: Oral  PainSc:          Complications: No notable events documented.

## 2023-02-09 NOTE — Anesthesia Procedure Notes (Signed)
Spinal  Patient location during procedure: OB Start time: 02/09/2023 2:28 PM End time: 02/09/2023 2:32 PM Reason for block: surgical anesthesia Staffing Performed: anesthesiologist  Anesthesiologist: Trevor Iha, MD Performed by: Trevor Iha, MD Authorized by: Trevor Iha, MD   Preanesthetic Checklist Completed: patient identified, IV checked, risks and benefits discussed, surgical consent, monitors and equipment checked, pre-op evaluation and timeout performed Spinal Block Patient position: sitting Prep: DuraPrep and site prepped and draped Patient monitoring: heart rate, cardiac monitor, continuous pulse ox and blood pressure Approach: midline Location: L3-4 Injection technique: single-shot Needle Needle type: Pencan  Needle gauge: 24 G Needle length: 10 cm Needle insertion depth: 6 cm Assessment Sensory level: T4 Events: CSF return Additional Notes  1Attempt (s). Pt tolerated procedure well.

## 2023-02-09 NOTE — Op Note (Signed)
Audrey Mcmillan Audrey Mcmillan PROCEDURE DATE: 02/09/23    PREOPERATIVE DIAGNOSES: Intrauterine pregnancy at [redacted]w[redacted]d weeks gestation; non-reassuring fetal status  POSTOPERATIVE DIAGNOSES: The same, viable infant delivered  PROCEDURE: Primary Low Transverse Cesarean Section  SURGEON: Dr. Debroah Loop  ASSISTANT:  [name], MD An experienced assistant was required given the standard of surgical care given the complexity of the case.  This assistant was needed for exposure, dissection, suctioning, retraction, instrument exchange, assisting with delivery with administration of fundal pressure, and for overall help during the procedure.  ANESTHESIOLOGY TEAM: Anesthesiologist: Trevor Iha, MD CRNA: Sheppard Evens, CRNA  INDICATIONS: Audrey Mcmillan is a 43 y.o. [phone] at [redacted]w[redacted]d here for cesarean section secondary to the indications listed under preoperative diagnoses; please see preoperative note for further details.  The risks of surgery were discussed with the patient including but were not limited to: bleeding which may require transfusion or reoperation; infection which may require antibiotics; injury to bowel, bladder, ureters or other surrounding organs; injury to the fetus; need for additional procedures including hysterectomy in the event of a life-threatening hemorrhage; formation of adhesions; placental abnormalities wth subsequent pregnancies; incisional problems; thromboembolic phenomenon and other postoperative/anesthesia complications.  The patient concurred with the proposed plan, giving informed written consent for the procedure.    FINDINGS:  Viable female infant in cephalic in direct occiput transverse presentation.  Apgars 7 and 9.  Amniotic fluid: clear.  Intact placenta, three vessel cord.  Normal uterus, fallopian tubes and ovaries bilaterally.  ANESTHESIA: spinal INTRAVENOUS FLUIDS: 1000 ml   ESTIMATED BLOOD LOSS: 402 ml URINE OUTPUT:  150  ml SPECIMENS: Placenta sent to L&D and arterial cord gas collected COMPLICATIONS: None immediate  PROCEDURE IN DETAIL:  The patient preoperatively received intravenous antibiotics and had sequential compression devices applied to her lower extremities.  She was then taken to the operating room where spinal anesthesia was found to be adequate. She was then placed in a dorsal supine position with a leftward tilt, and prepped and draped in a sterile manner.  A foley catheter was  placed into her bladder and attached to constant gravity.  After an adequate timeout was performed, a Pfannenstiel skin incision was made with scalpel and carried through to the underlying layer of fascia. The fascia was incised in the midline, and this incision was extended bluntly. The rectus muscles were separated in the midline and the peritoneum was entered bluntly.   The Alexis self-retaining retractor was introduced into the abdominal cavity.  Attention was turned to the lower uterine segment where a low transverse hysterotomy was made with a scalpel and extended bluntly in caudad and cephalad directions.  The infant was successfully delivered using Kiwi vacuum with one pull and zero pop-offs, the cord was clamped and cut after one minute, and the infant was handed over to the awaiting neonatology team. Uterine massage was then administered, and the placenta delivered intact with a three-vessel cord. The uterus was cleared of clots and debris.  The hysterotomy was closed with 0-Vicryl in a running fashion. A second imbricating layer was placed with 0- Vicryl in running fashion.  The pelvis was cleared of all clot and debris. Hemostasis was confirmed on all surfaces. The uterus was returned to the abdomen and the incision was once again inspected and found to be hemostatic. The retractor was removed.  The peritoneum was closed with a 2-0 Vicryl running stitch. The fascia was then closed using 0-Vicryl in a running fashion.  The  subcutaneous  layer was irrigated, any areas of bleeding were cauterized with the bovie,  was reapproximated with 2-0 plain gut in a running fashion, was found to be hemostatic.. . The skin was closed with a 4-0 Vicryl subcuticular stitch. The patient tolerated the procedure well. Sponge, instrument and needle counts were correct x 3.  She was taken to the recovery room in stable condition.   [name], MD FMOB Fellow, Faculty practice [facility], Center for [facility] 02/09/23  4:47 PM

## 2023-02-09 NOTE — Procedures (Signed)
Procedure note:  Mirena IUD was placed during cesarean delivery -- once hysterotomy was three-quarters closed, a Mirena IUD was removed from the inserting device, grasped between two fingers, and placed through the open hysterotomy into the fundus. Kelly clamps were used to grasp the IUD strings and release them through the cervix.   Serial no.: [id number]   Audrey Aland, MD OB Fellow, Faculty Practice [facility], Center for [facility]

## 2023-02-09 NOTE — Discharge Summary (Signed)
Postpartum Discharge Summary  Date of Service updated***     Patient Name: Callee Druschel DOB: 1980-06-13 MRN: [id number]  Date of admission: 02/08/2023 Delivery date:02/09/2023 Delivering provider: Adam Phenix Date of discharge: 02/09/2023  Admitting diagnosis: Chronic hypertension with superimposed pre-eclampsia [O11.9] Preeclampsia, severe [O14.10] Intrauterine pregnancy: [redacted]w[redacted]d     Secondary diagnosis:  Principal Problem:   Status post primary low transverse cesarean section Active Problems:   Acquired hypothyroidism   Type 2 diabetes mellitus with diabetic polyneuropathy, with long-term current use of insulin (HCC)   AMA (advanced maternal age) multigravida 35+   Preexisting diabetes complicating pregnancy, antepartum   History of proteinuria in pregnancy   History of severe pre-eclampsia   Supervision of high risk pregnancy, antepartum   Chronic hypertension affecting pregnancy   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 yo [phone] at [redacted]w[redacted]d was admitted to the hospital 02/08/2023 for induction of labor. Patient had a labor course significant for persistent category II strip with moderate variability and frequent late decelerations. The patient went for cesarean section due to Non-Reassuring FHR. Delivery details are as follows: Membrane Rupture Time/Date: 9:08 AM,   Delivery Method:C-Section, Low Transverse Operative Delivery:Device used:Kiwi vacuum Details of operation can be found in separate  operative Note.  Patient had a postpartum course complicated by***. She is ambulating, tolerating a regular diet, passing flatus, and urinating well.  Patient is discharged home in stable condition on 02/09/23.      Newborn Data: Birth date:02/09/2023 Birth time:2:59 PM Gender:Female Living status:Living Apgars:7 ,9  Weight:2500 g                               Magnesium Sulfate received: Yes: Seizure prophylaxis BMZ received: No Rhophylac:N/A MMR:N/A T-DaP:Given prenatally Flu: No RSV Vaccine received: No Transfusion:{Transfusion received:30440034}  Immunizations received: Immunization History  Administered Date(s) Administered   Influenza,inj,Quad PF,6+ Mos 02/27/2017, 11/08/2017, 01/02/2021, 02/05/2022   Janssen (J&J) SARS-COV-2 Vaccination 07/09/2019   Pneumococcal Polysaccharide-23 06/27/2017   Tdap 02/27/2017, 12/13/2022    Physical exam  Vitals:   02/09/23 1843 02/09/23 1905 02/09/23 1930 02/09/23 1950  BP: (!) 163/82 (!) 155/83    Pulse: 67 64    Resp:  14    Temp: (!) 96.2 F (35.7 C) (!) 96 F (35.6 C) (!) 97.5 F (36.4 C) 97.6 F (36.4 C)  TempSrc: Axillary Axillary Oral Oral  SpO2:  97%    Weight:      Height:       General: {Exam; general:21111117} Lochia: {Desc; appropriate/inappropriate:30686::"appropriate"} Uterine Fundus: {Desc; firm/soft:30687} Incision: {Exam; incision:21111123} DVT Evaluation: {Exam; dvt:2111122} Labs: Lab Results  Component Value Date   WBC 14.1 (H) 02/09/2023   HGB 10.0 (L) 02/09/2023   HCT 28.2 (L) 02/09/2023   MCV 90.4 02/09/2023   PLT 195 02/09/2023      Latest Ref Rng & Units  02/09/2023    4:24 PM  CMP  Glucose 70 - 99 mg/dL 956   BUN 6 - 20 mg/dL 16   Creatinine 2.13 - 1.00 mg/dL 0.86 - 5.78 mg/dL 4.69    6.29   Sodium 528 - 145 mmol/L 131   Potassium 3.5 - 5.1 mmol/L 4.1   Chloride 98 - 111 mmol/L 107   CO2 22 - 32 mmol/L 17   Calcium 8.9 - 10.3 mg/dL 7.5   Total Protein 6.5 - 8.1 g/dL 4.9   Total  Bilirubin 0.0 - 1.2 mg/dL 0.6   Alkaline Phos 38 - 126 U/L 101   AST 15 - 41 U/L 23   ALT 0 - 44 U/L 19    Edinburgh Score:    01/18/2021    8:24 AM  Edinburgh Postnatal Depression Scale Screening Tool  I have been able to laugh and see the funny side of things. 0  I have looked forward with enjoyment to things. 0  I have blamed myself unnecessarily when things went wrong. 0  I have been anxious or worried for no good reason. 0  I have felt scared or panicky for no good reason. 0  Things have been getting on top of me. 0  I have been so unhappy that I have had difficulty sleeping. 0  I have felt sad or miserable. 0  I have been so unhappy that I have been crying. 0  The thought of harming myself has occurred to me. 0  Edinburgh Postnatal Depression Scale Total 0   No data recorded  After visit meds:  Allergies as of 02/09/2023   No Known Allergies   Med Rec must be completed prior to using this [facility]***        Discharge home in stable condition Infant Feeding: {Baby feeding:23562} Infant Disposition:{CHL IP OB HOME WITH UXLKGM:01027} Discharge instruction: per After Visit Summary and Postpartum booklet. Activity: Advance as tolerated. Pelvic rest for 6 weeks.  Diet: {OB OZDG:64403474} Future Appointments: Future Appointments  Date Time Provider Department Center  02/11/2023  2:15 PM [facility] NURSE [facility]  02/11/2023  2:30 PM [facility] US3 [facility]  02/11/2023  3:55 PM Anyanwu, Jethro Bastos, MD [facility]   Follow up Visit: Message sent to [facility] 1/18  Please schedule this patient for a In person postpartum visit in 4 weeks with the following provider: Any provider. Additional Postpartum F/U:Incision check 1 week and BP check 2-3 days  High risk pregnancy complicated by: GDM and HTN Delivery mode:  C-Section, Low Transverse Anticipated Birth Control:  PP IUD placed   02/09/2023 [name], MD

## 2023-02-09 NOTE — Progress Notes (Signed)
Labor Progress Note Audrey Mcmillan Audrey Mcmillan is a 43 y.o. [phone] at [redacted]w[redacted]d presented for IOL due to cHTN< now with SIPE w SF, hx poorly controlled T2DM  S: No concerns at this time.  O:  BP 135/71   Pulse 68   Temp 97.9 F (36.6 C) (Oral)   Resp 16   Ht 5\' 2"  (1.575 m)   Wt 91.6 kg   LMP  (LMP Unknown)   SpO2 99%   BMI 36.93 kg/m  EFM: 125/mod/-a/+late decels  CVE: Dilation: 3.5 Effacement (%): 50 Cervical Position: Posterior Station: -3 Presentation: Vertex Exam by:: Sloka Volante   A&P: 43 y.o. [id number] [redacted]w[redacted]d here for IOL due to SIPE on cHTN w SF, on mag, and poorly controlled T2DM on Endotool #Labor: Continues to have moderate variability, but repetitive late decels, making slow progress. Plan to recheck around 1300 and reassess plan #Pain: Per pt request #FWB: Cat II, but has mod variability so reassuring #GBS negative on 01/11/23  #SIPE on cHTN w SF: On Mag  cont home Labetalol and Procardia  Labetalol protocol  seizure precautions  #Uncontrolled T2DM: Suspect elevated PCR partially secondary to diabetic nephropathy at baseline  BL Cr appears to be 1.0-1.2  on Endotool now  #Hypothyroidism: Cont home Synthroid  #AMA  [name], MD 12:12 PM

## 2023-02-10 ENCOUNTER — Encounter (HOSPITAL_COMMUNITY): Payer: Self-pay | Admitting: Obstetrics & Gynecology

## 2023-02-10 LAB — CBC
HCT: 29.2 % — ABNORMAL LOW (ref 36.0–46.0)
HCT: 26.6 % — ABNORMAL LOW (ref 36.0–46.0)
Hemoglobin: 10.1 g/dL — ABNORMAL LOW (ref 12.0–15.0)
Hemoglobin: 9.6 g/dL — ABNORMAL LOW (ref 12.0–15.0)
MCH: 31 pg (ref 26.0–34.0)
MCH: 33 pg (ref 26.0–34.0)
MCHC: 34.6 g/dL (ref 30.0–36.0)
MCHC: 36.1 g/dL — ABNORMAL HIGH (ref 30.0–36.0)
MCV: 89.6 fL (ref 80.0–100.0)
MCV: 91.4 fL (ref 80.0–100.0)
Platelets: 209 10*3/uL (ref 150–400)
Platelets: 203 10*3/uL (ref 150–400)
RBC: 3.26 MIL/uL — ABNORMAL LOW (ref 3.87–5.11)
RBC: 2.91 MIL/uL — ABNORMAL LOW (ref 3.87–5.11)
RDW: 12.7 % (ref 11.5–15.5)
RDW: 12.8 % (ref 11.5–15.5)
WBC: 13.7 10*3/uL — ABNORMAL HIGH (ref 4.0–10.5)
WBC: 12.9 10*3/uL — ABNORMAL HIGH (ref 4.0–10.5)
nRBC: 0 % (ref 0.0–0.2)
nRBC: 0 % (ref 0.0–0.2)

## 2023-02-10 LAB — GLUCOSE, CAPILLARY
Glucose-Capillary: 163 mg/dL — ABNORMAL HIGH (ref 70–99)
Glucose-Capillary: 112 mg/dL — ABNORMAL HIGH (ref 70–99)
Glucose-Capillary: 208 mg/dL — ABNORMAL HIGH (ref 70–99)
Glucose-Capillary: 108 mg/dL — ABNORMAL HIGH (ref 70–99)
Glucose-Capillary: 228 mg/dL — ABNORMAL HIGH (ref 70–99)

## 2023-02-10 LAB — COMPREHENSIVE METABOLIC PANEL
ALT: 23 U/L (ref 0–44)
ALT: 25 U/L (ref 0–44)
AST: 26 U/L (ref 15–41)
AST: 34 U/L (ref 15–41)
Albumin: 1.9 g/dL — ABNORMAL LOW (ref 3.5–5.0)
Albumin: 2.1 g/dL — ABNORMAL LOW (ref 3.5–5.0)
Alkaline Phosphatase: 91 U/L (ref 38–126)
Alkaline Phosphatase: 99 U/L (ref 38–126)
Anion gap: 8 (ref 5–15)
Anion gap: 10 (ref 5–15)
BUN: 16 mg/dL (ref 6–20)
BUN: 20 mg/dL (ref 6–20)
CO2: 20 mmol/L — ABNORMAL LOW (ref 22–32)
CO2: 20 mmol/L — ABNORMAL LOW (ref 22–32)
Calcium: 7.4 mg/dL — ABNORMAL LOW (ref 8.9–10.3)
Calcium: 7.4 mg/dL — ABNORMAL LOW (ref 8.9–10.3)
Chloride: 103 mmol/L (ref 98–111)
Chloride: 101 mmol/L (ref 98–111)
Creatinine, Ser: 1.35 mg/dL — ABNORMAL HIGH (ref 0.44–1.00)
Creatinine, Ser: 1.61 mg/dL — ABNORMAL HIGH (ref 0.44–1.00)
GFR, Estimated: 51 mL/min — ABNORMAL LOW (ref >60)
GFR, Estimated: 41 mL/min — ABNORMAL LOW (ref >60)
Glucose, Bld: 127 mg/dL — ABNORMAL HIGH (ref 70–99)
Glucose, Bld: 149 mg/dL — ABNORMAL HIGH (ref 70–99)
Potassium: 4.2 mmol/L (ref 3.5–5.1)
Potassium: 4.2 mmol/L (ref 3.5–5.1)
Sodium: 131 mmol/L — ABNORMAL LOW (ref 135–145)
Sodium: 131 mmol/L — ABNORMAL LOW (ref 135–145)
Total Bilirubin: 0.6 mg/dL (ref 0.0–1.2)
Total Bilirubin: 0.2 mg/dL (ref 0.0–1.2)
Total Protein: 5 g/dL — ABNORMAL LOW (ref 6.5–8.1)
Total Protein: 5.2 g/dL — ABNORMAL LOW (ref 6.5–8.1)

## 2023-02-10 LAB — MAGNESIUM
Magnesium: 6.3 mg/dL (ref 1.7–2.4)
Magnesium: 4.7 mg/dL — ABNORMAL HIGH (ref 1.7–2.4)

## 2023-02-10 MED ORDER — IBUPROFEN 600 MG PO TABS: 600.0000 mg | ORAL_TABLET | Freq: Four times a day (QID) | ORAL | Status: DC

## 2023-02-10 MED ORDER — DEXTROSE INFANT ORAL GEL 40%
ORAL | Status: AC
  Filled 2023-02-10: qty 1.2

## 2023-02-10 MED ORDER — INSULIN GLARGINE-YFGN 100 UNIT/ML ~~LOC~~ SOLN
12.0000 [IU] | Freq: Every day | SUBCUTANEOUS | Status: DC
  Filled 2023-02-10: qty 0.12

## 2023-02-10 NOTE — Lactation Note (Signed)
This note was copied from a baby's chart. Lactation Consultation Note  Patient Name: Audrey Mcmillan [id number] Date: 02/10/2023 Age:42 hours Reason for consult: Initial assessment;Late-preterm 34-36.6wks;Infant < 6lbs;Maternal endocrine disorder  P3- Infant was born at [redacted]w[redacted]d weighing 2500g. Infant has had a weight loss of 3.38%. At this time, infant's feeding flowsheets look like she is formula feeding only, but MOB reports that infant has been latching to the breast sometimes as well. MOB is using a white Nfant nipple to bottle feed infant. Infant is feeding over adequate volumes at this time. LC praised MOB and infant. MOB has already been provided with the LPI/low birth weight feeding guidelines and confirms that she understands them. MOB has been set up with a DEBP and reports that she has used it once so far. LC encouraged MOB to attempt latching infant with every feeding before offer formula, and then pump after infant's bottle feeding. LC reviewed how breast milk has natural laxative properties that may help with infant's jaundice levels. MOB verbalizes understanding and agreement.  LC reviewed the LPI/low birth weight feeding plan, feeding infant on cue 8-12x in 24 hrs, not allowing infant to go over 3 hrs without a feeding, CDC milk storage guidelines and LC services handout. LC encouraged MOB to call for further assistance as needed.  Maternal Data Has patient been taught Hand Expression?: No Does the patient have breastfeeding experience prior to this delivery?: Yes How long did the patient breastfeed?: 2 years with one child and 3-4 months with other child  Feeding Mother's Current Feeding Choice: Breast Milk and Formula Nipple Type: Nfant Standard Flow (white)  Lactation Tools Discussed/Used Tools: Pump;Flanges Breast pump type: Double-Electric Breast Pump;Manual Pump Education: Setup, frequency, and cleaning;Milk Storage Reason for Pumping: LPI/low birth weight feeding  guidelines Pumping frequency: 15-20 min every 3 hrs  Interventions Interventions: Breast feeding basics reviewed;Hand pump;DEBP;Education;LC Services brochure;LPT handout/interventions  Discharge Discharge Education: Warning signs for feeding baby Pump: DEBP;Personal  Consult Status Consult Status: Follow-up Date: 02/11/23 Follow-up type: In-patient    Dema Severin BS, IBCLC 02/10/2023, 4:55 PM

## 2023-02-10 NOTE — Progress Notes (Signed)
Subjective: Postpartum Day 1: Cesarean Delivery Patient reports nausea, incisional pain, and tolerating PO.    Objective: Vital signs in last 24 hours: Temp:  [96 F (35.6 C)-98.5 F (36.9 C)] 98 F (36.7 C) (01/19 0816) Pulse Rate:  [61-76] 71 (01/19 0817) Resp:  [9-19] 17 (01/19 0817) BP: (116-174)/(66-101) 116/72 (01/19 0817) SpO2:  [93 %-98 %] 97 % (01/19 0817)  Physical Exam:  General: alert, cooperative, and no distress Lochia: appropriate Uterine Fundus: firm Incision: no significant drainage DVT Evaluation: No evidence of DVT seen on physical exam.  Recent Labs    02/09/23 2110 02/10/23 0417  HGB 10.6* 10.1*  HCT 30.2* 29.2*    Assessment/Plan: Status post Cesarean section. Doing well postoperatively.  D/C magnesium 1400.  Scheryl Darter, MD 02/10/2023, 10:14 AM

## 2023-02-10 NOTE — Lactation Note (Signed)
This note was copied from a baby's chart. Lactation Consultation Note  Patient Name: Audrey Mcmillan Date: 02/10/2023 Age:42 hours   Spoke with RN regarding mother's desire to see lactation.  RN will speak with mother and let Lactation know.   Audrey Mcmillan 02/10/2023, 10:49 AM

## 2023-02-11 ENCOUNTER — Other Ambulatory Visit: Payer: Self-pay | Admitting: Obstetrics

## 2023-02-11 ENCOUNTER — Encounter (HOSPITAL_COMMUNITY): Payer: Self-pay | Admitting: Obstetrics & Gynecology

## 2023-02-11 ENCOUNTER — Other Ambulatory Visit: Payer: Self-pay

## 2023-02-11 ENCOUNTER — Ambulatory Visit: Payer: Self-pay

## 2023-02-11 ENCOUNTER — Encounter: Payer: Self-pay | Admitting: Obstetrics & Gynecology

## 2023-02-11 DIAGNOSIS — O09299 Supervision of pregnancy with other poor reproductive or obstetric history, unspecified trimester: Secondary | ICD-10-CM

## 2023-02-11 DIAGNOSIS — O24113 Pre-existing diabetes mellitus, type 2, in pregnancy, third trimester: Secondary | ICD-10-CM

## 2023-02-11 DIAGNOSIS — Z98891 History of uterine scar from previous surgery: Secondary | ICD-10-CM

## 2023-02-11 DIAGNOSIS — O10919 Unspecified pre-existing hypertension complicating pregnancy, unspecified trimester: Secondary | ICD-10-CM

## 2023-02-11 DIAGNOSIS — O09523 Supervision of elderly multigravida, third trimester: Secondary | ICD-10-CM

## 2023-02-11 LAB — COMPREHENSIVE METABOLIC PANEL
ALT: 25 U/L (ref 0–44)
AST: 33 U/L (ref 15–41)
Albumin: 1.9 g/dL — ABNORMAL LOW (ref 3.5–5.0)
Alkaline Phosphatase: 97 U/L (ref 38–126)
Anion gap: 8 (ref 5–15)
BUN: 20 mg/dL (ref 6–20)
CO2: 21 mmol/L — ABNORMAL LOW (ref 22–32)
Calcium: 7.8 mg/dL — ABNORMAL LOW (ref 8.9–10.3)
Chloride: 107 mmol/L (ref 98–111)
Creatinine, Ser: 1.4 mg/dL — ABNORMAL HIGH (ref 0.44–1.00)
GFR, Estimated: 48 mL/min — ABNORMAL LOW (ref >60)
Glucose, Bld: 80 mg/dL (ref 70–99)
Potassium: 3.8 mmol/L (ref 3.5–5.1)
Sodium: 136 mmol/L (ref 135–145)
Total Bilirubin: 0.4 mg/dL (ref 0.0–1.2)
Total Protein: 5.2 g/dL — ABNORMAL LOW (ref 6.5–8.1)

## 2023-02-11 LAB — GLUCOSE, CAPILLARY
Glucose-Capillary: 88 mg/dL (ref 70–99)
Glucose-Capillary: 70 mg/dL (ref 70–99)
Glucose-Capillary: 148 mg/dL — ABNORMAL HIGH (ref 70–99)
Glucose-Capillary: 151 mg/dL — ABNORMAL HIGH (ref 70–99)

## 2023-02-11 MED ORDER — INSULIN GLARGINE-YFGN 100 UNIT/ML ~~LOC~~ SOLN
10.0000 [IU] | Freq: Every day | SUBCUTANEOUS | Status: DC
  Administered 2023-02-11: 10 [IU] via SUBCUTANEOUS
  Filled 2023-02-11 (×2): qty 0.1

## 2023-02-11 MED ORDER — METFORMIN HCL ER 500 MG PO TB24
500.0000 mg | ORAL_TABLET | Freq: Two times a day (BID) | ORAL | Status: DC
Start: 2023-02-11 — End: 2023-02-11

## 2023-02-11 MED ORDER — METFORMIN HCL 500 MG PO TABS
500.0000 mg | ORAL_TABLET | Freq: Two times a day (BID) | ORAL | Status: DC
Start: 2023-02-11 — End: 2023-02-12
  Administered 2023-02-11 – 2023-02-12 (×2): 500 mg via ORAL
  Filled 2023-02-11 (×2): qty 1

## 2023-02-11 NOTE — Progress Notes (Signed)
POSTPARTUM PROGRESS NOTE  POD # 2  Subjective:  Audrey Mcmillan is a 43 y.o. [phone] s/p primary LTCS at [redacted]w[redacted]d.  She reports she doing well. No acute events overnight.  She denies any problems with ambulating, voiding or po intake. Denies nausea or vomiting. She has  passed flatus. Pain is well controlled.  Lochia is normal.  Objective: Blood pressure 138/67, pulse 73, temperature 98.4 F (36.9 C), temperature source Oral, resp. rate 18, height 5\' 2"  (1.575 m), weight 91.6 kg, SpO2 97%, unknown if currently breastfeeding.  Physical Exam:  General: alert, cooperative and no distress Chest: no respiratory distress Heart:regular rate, distal pulses intact Abdomen: soft, nontender, nondistended, positive bowel sounds Uterine Fundus: firm, appropriately tender DVT Evaluation: No calf swelling or tenderness Extremities: 2+ edema Skin: warm, dry; incision clean/dry/intact w/ honeycomb dressing in place  Recent Labs    02/10/23 0417 02/10/23 1653  HGB 10.1* 9.6*  HCT 29.2* 26.6*    Assessment/Plan: Audrey Mcmillan is a 43 y.o. [phone] s/p primary LTCS at [redacted]w[redacted]d for nonreassuring fetal status.  POD#2 -  Contraception: IUD Feeding: breast  Semglee decreased to 10 units per day, talked to diabetic counselor to see if patient needs timed regular insulin for meals in preparation for possible d/c in the AM  Dispo: Anticipate discharge on 02/12/23.   LOS: 3 days   Mariel Aloe, Md Faculty Attending, [facility] [employer] 02/11/2023, 10:04 AM

## 2023-02-11 NOTE — Lactation Note (Signed)
This note was copied from a baby's chart. Lactation Consultation Note  Patient Name: Audrey Mcmillan [id number] Date: 02/11/2023 Age:42 hours Reason for consult: Follow-up assessment;Late-preterm 34-36.6wks;Infant < 6lbs;Maternal endocrine disorder  P3 mom of 16 hour old LPT infant consulted for follow up. Mom reports to [facility] that she is following the LPI/LBW feeding guidelines and has successfully placed baby to the breast for 15 minutes followed by the supplementation of formula as per feeding policy. Mom denies having any discomfort with the latch and reports that she pumps after feeding baby. She verbalized that she feels as if her milk is transitioning. Has a personal pump at home and denies need of any latching assistance at this time.   LC informed mom to call for lactation assistance as needed.    Maternal Data Has patient been taught Hand Expression?: Yes  Feeding Mother's Current Feeding Choice: Breast Milk and Formula Nipple Type: Nfant Standard Flow (white)  LATCH Score Infant being held STS with mom at [facility] entry  Lactation Tools Discussed/Used Flange Size: 24 Breast pump type: Double-Electric Breast Pump Pump Education: Setup, frequency, and cleaning Reason for Pumping: LPI/LBW Pumping frequency: reports yielding drops of colostrum, but expresses more via hand expression Pumped volume: 0 mL  Interventions Interventions: Breast feeding basics reviewed;Education  Discharge Pump: DEBP;Personal  Consult Status Consult Status: Follow-up Date: 02/12/23 Follow-up type: In-patient    Su Grand 02/11/2023, 12:40 PM

## 2023-02-11 NOTE — Inpatient Diabetes Management (Signed)
Inpatient Diabetes Program Recommendations  AACE/ADA: New Consensus Statement on Inpatient Glycemic Control (2015)  Target Ranges:  Prepandial:   less than 140 mg/dL      Peak postprandial:   less than 180 mg/dL (1-2 hours)      Critically ill patients:  140 - 180 mg/dL   Lab Results  Component Value Date   GLUCAP 148 (H) 02/11/2023   HGBA1C 7.3 (H) 08/28/2022    Review of Glycemic Control  Latest Reference Range & Units 02/10/23 09:35 02/10/23 12:54 02/10/23 19:37 02/10/23 23:26 02/11/23 08:04 02/11/23 08:43 02/11/23 12:18  Glucose-Capillary 70 - 99 mg/dL 829 (H) 562 (H) 130 (H) 228 (H) 88 70 148 (H)  (H): Data is abnormally high  Diabetes history: DM2  Outpatient Diabetes medications:  Lantus 24 every day, Humalog 24 units TID (on admission for delivery) Basaglar 10 units every day, Trulicity 1.5 mg weekly and Jardiance (prior to admission)  Current orders for Inpatient glycemic control: Semglee 10 units every day, Novolog 0-15 units TID  Met with patient at bedside.  She confirms above home medications.  This is her 3rd baby; she had diabetes with her 2nd baby.  She remembers her insulin needs went down after delivery.  We discussed the pathophysiology of DM and delivery of placenta.  Spoke with Dr. Donavan Foil this morning.  He is considering sending her home on a sliding scale of Humalog because she has some CBGs in the 200's.  Educated her on how to use a sliding scale.  Trulicity and [place] Pepper are not recommended during pregnancy or while breastfeeding.    She follows up with [facility] and she obtains her insulins there as well at a discounted cost.  Might consider Lantus 10 units every day and Metformin 500 mg BID at discharge.  Could start Metformin 500 mg BID now while in the hospital.    We discussed hypoglycemia, signs, symptoms and treatments.    Will continue to follow while inpatient.  Thank you, Dulce Sellar, MSN, CDCES Diabetes Coordinator Inpatient  Diabetes Program [phone] (team pager from 8a-5p)

## 2023-02-12 ENCOUNTER — Other Ambulatory Visit (HOSPITAL_COMMUNITY): Payer: Self-pay

## 2023-02-12 ENCOUNTER — Inpatient Hospital Stay (HOSPITAL_COMMUNITY): Payer: MEDICAID

## 2023-02-12 LAB — BASIC METABOLIC PANEL
Anion gap: 7 (ref 5–15)
BUN: 21 mg/dL — ABNORMAL HIGH (ref 6–20)
CO2: 22 mmol/L (ref 22–32)
Calcium: 8.1 mg/dL — ABNORMAL LOW (ref 8.9–10.3)
Chloride: 109 mmol/L (ref 98–111)
Creatinine, Ser: 1.52 mg/dL — ABNORMAL HIGH (ref 0.44–1.00)
GFR, Estimated: 44 mL/min — ABNORMAL LOW (ref >60)
Glucose, Bld: 121 mg/dL — ABNORMAL HIGH (ref 70–99)
Potassium: 3.8 mmol/L (ref 3.5–5.1)
Sodium: 138 mmol/L (ref 135–145)

## 2023-02-12 LAB — GLUCOSE, CAPILLARY
Glucose-Capillary: 110 mg/dL — ABNORMAL HIGH (ref 70–99)
Glucose-Capillary: 106 mg/dL — ABNORMAL HIGH (ref 70–99)

## 2023-02-12 LAB — URINALYSIS, ROUTINE W REFLEX MICROSCOPIC
Bilirubin Urine: NEGATIVE
Glucose, UA: 50 mg/dL — AB
Ketones, ur: NEGATIVE mg/dL
Leukocytes,Ua: NEGATIVE
Nitrite: NEGATIVE
Protein, ur: >=300 mg/dL — AB
RBC / HPF: >50 RBC/hpf (ref 0–5)
Specific Gravity, Urine: 1.011 (ref 1.005–1.030)
pH: 6 (ref 5.0–8.0)

## 2023-02-12 MED ORDER — INSULIN GLARGINE 100 UNIT/ML SOLOSTAR PEN
10.0000 [IU] | PEN_INJECTOR | Freq: Every day | SUBCUTANEOUS | 0 refills | Status: DC
  Filled 2023-02-12 (×2): qty 15, 30d supply, fill #0

## 2023-02-12 MED ORDER — FUROSEMIDE 20 MG PO TABS
20.0000 mg | ORAL_TABLET | Freq: Every day | ORAL | 0 refills | Status: DC
  Filled 2023-02-12: qty 5, 5d supply, fill #0

## 2023-02-12 MED ORDER — NIFEDIPINE ER 60 MG PO TB24
60.0000 mg | ORAL_TABLET | Freq: Two times a day (BID) | ORAL | 1 refills | Status: DC
  Filled 2023-02-12: qty 60, 30d supply, fill #0

## 2023-02-12 MED ORDER — ACETAMINOPHEN 500 MG PO TABS
1000.0000 mg | ORAL_TABLET | Freq: Four times a day (QID) | ORAL | 0 refills | Status: AC
  Filled 2023-02-12: qty 30, 4d supply, fill #0

## 2023-02-12 MED ORDER — METFORMIN HCL 500 MG PO TABS
500.0000 mg | ORAL_TABLET | Freq: Two times a day (BID) | ORAL | 1 refills | Status: DC
  Filled 2023-02-12: qty 60, 30d supply, fill #0

## 2023-02-12 MED ORDER — OXYCODONE HCL 5 MG PO TABS
5.0000 mg | ORAL_TABLET | ORAL | 0 refills | Status: DC | PRN
  Filled 2023-02-12: qty 12, 2d supply, fill #0

## 2023-02-12 NOTE — TOC Initial Note (Signed)
Transition of Care [facility]) - Initial/Assessment Note    Patient Details  Name: Audrey Mcmillan MRN: [id number] Date of Birth: [birth year]  Transition of [facility]) CM/SW Contact:    Geoffery Lyons, RN Phone [phone] 02/12/2023, 10:17 AM  Clinical Narrative:                  Post Partum Day 3 s/p pLTCS NRFHT    Patient does not have insurance. CM placed MATCH in system and notified TOC pharmacy at [facility] and they will fill medications at no cost to the patient. Patient has PCP and also will follow up with OBGYN clinic after discharge.   Activities of Daily Living   ADL Screening (condition at time of admission) Independently performs ADLs?: Yes (appropriate for developmental age) Is the patient deaf or have difficulty hearing?: No Does the patient have difficulty seeing, even when wearing glasses/contacts?: No Does the patient have difficulty concentrating, remembering, or making decisions?: No    Admission diagnosis:  Chronic hypertension with superimposed pre-eclampsia [O11.9] Preeclampsia, severe [O14.10] Patient Active Problem List   Diagnosis Date Noted   Preeclampsia, severe 02/09/2023   Status post primary low transverse cesarean section 02/09/2023   Chronic hypertension with superimposed pre-eclampsia 02/08/2023   Elevated serum creatinine 01/18/2023   Urinary tract infection in mother during first trimester of pregnancy 07/25/2022   Supervision of high risk pregnancy, antepartum 07/18/2022   Chronic hypertension affecting pregnancy 07/18/2022   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 [place] [address] Phone: [phone] Fax: [phone]  [facility] [address] [place] [address] Phone: [phone] Fax: [phone]  [id number] - [place], [place] - [address] AT [facility] [address] [place] [address] Phone: [phone] Fax: [phone]  [id number] - [place], [place] - [address] AT [facility] [address] [place] [address] Phone: [phone] Fax: [phone]     Social Drivers of Health (SDOH) Social History: SDOH Screenings   Food Insecurity: No Food Insecurity (02/09/2023)  Housing: Low Risk  (02/09/2023)  Transportation Needs: No Transportation Needs (02/09/2023)  Utilities: Not At Risk (02/09/2023)  Depression (PHQ2-9): Low Risk  (07/20/2022)  Financial Resource Strain: Medium Risk (06/18/2022)  Physical Activity: Insufficiently Active (06/18/2022)  Social Connections: Patient Declined (02/09/2023)  Stress: No Stress Concern Present (06/18/2022)  Tobacco Use: Low Risk  (02/09/2023)   SDOH Interventions:     Readmission Risk Interventions     No data to display

## 2023-02-12 NOTE — Lactation Note (Signed)
This note was copied from a baby's chart. Lactation Consultation Note  Patient Name: Audrey Mcmillan [id number] Date: 02/12/2023 Age:43 years Reason for consult: Follow-up assessment;Infant weight loss;Late-preterm 34-36.6wks;Infant < 6lbs;Maternal endocrine disorder (4 % weight loss,) Per mom latching the baby to the breast for a short time and then bottle feeding.  LC recommended if the baby latches feed 10 -20 mins, supplement with EBM or formula 30 ml or more and then post pump both breast for 15 -20 mns , save milk for the next feeding.  LC reviewed engorgement prevention and tx .  Mom mentioned with her 39 year old she had low milk supply.  LC reassured mom with the 2nd baby is potentially can be different.  LC stressed the importance of building the milk supply up by doing the above plan and as the baby grows over 6 pounds can give the more time at the breast with latching.  Per mom has a DEBP Medela at home.  LC offered to request and LC O/P and mom declined. ( See below for details )  Serum Bilirubin increased back up to 11.3. D/C has not been written.  LC spoke with the nurse caring for the dyad and she felt they would go home.  Maternal Data Has patient been taught Hand Expression?: Yes  Feeding Mother's Current Feeding Choice: Breast Milk and Formula Nipple Type: Nfant Standard Flow (white)  LATCH Score                    Lactation Tools Discussed/Used Tools: Pump;Flanges Flange Size: 24 Breast pump type: Manual;Double-Electric Breast Pump Pump Education: Milk Storage;Setup, frequency, and cleaning;Other (comment) (mom has been pumping) Pumped volume: 30 mL (per mom total)  Interventions    Discharge Discharge Education: Engorgement and breast care;Warning signs for feeding baby;Outpatient recommendation;Other (comment) (mom declined LC O/P when ofered. LC provided the [facility] resources and reminded mom if she needed it to call.) Pump: Personal;DEBP  (per mom Medela)  Consult Status Consult Status: Complete Date: 02/12/23    Audrey Mcmillan 02/12/2023, 12:49 PM

## 2023-02-12 NOTE — Progress Notes (Signed)
Post Partum Day 3 s/p pLTCS NRFHT Subjective: no complaints, up ad lib, voiding, tolerating PO, and + flatus  Objective: Blood pressure (!) 147/69, pulse 78, temperature 98.3 F (36.8 C), temperature source Oral, resp. rate 18, height 5\' 2"  (1.575 m), weight 91.6 kg, SpO2 98%, unknown if currently breastfeeding.  Physical Exam:  General: alert, cooperative, and no distress Lochia: appropriate Uterine Fundus: firm Incision: honeycomb in place c/d/i DVT Evaluation: No evidence of DVT seen on physical exam. Bilateral pitting edema of lower extremities  Recent Labs    02/10/23 0417 02/10/23 1653  HGB 10.1* 9.6*  HCT 29.2* 26.6*    Assessment/Plan: Postpartum Meeting appropriate post op milestones Anticipate discharge home today if renal testing normal (see below)  Neonatal Female infant, doing well at bedside  3. T2DM DM coordinator following Continue metformin 500 BID and semglee 10u at bedtime  4. SIPE w/ SF (BP, Cr) -S/p mag -Mild range pressures on procardia 90 daily -- will increase to 60 BID -Labs notable for worsening Cr this AM. I/Os not being collected, but pt reports subjectively normal UOP and denies issues voiding.  - Will obtain UA and RP [place]. If normal, anticipate discharge home with repeat Cr within 1 week.    LOS: 4 days   Audrey Mcmillan 02/12/2023, 8:20 AM

## 2023-02-14 ENCOUNTER — Ambulatory Visit: Payer: Self-pay

## 2023-02-14 ENCOUNTER — Other Ambulatory Visit: Payer: Self-pay

## 2023-02-14 VITALS — BP 139/72 | HR 82 | Ht 62.0 in | Wt 184.1 lb

## 2023-02-14 DIAGNOSIS — Z5189 Encounter for other specified aftercare: Secondary | ICD-10-CM

## 2023-02-14 NOTE — Progress Notes (Signed)
Pt here for incision check s/p primary c-section on 02/09/23.  With Spanish Interpreter pt reports mild pain and no bleeding at site.  Pt denies headaches and visual changes.  Last dose of Procardia at 1100.   Pt states that last dose.  BP LA 142/83.  Rpt BP RA 139/72 Incision observed with honeycomb dressing applied.  Dressing removed.  Incision observed with no odor, no drainage, no edema, and no redness.   Pt advised to continue to monitor for sx's of infection and elevated BP.   Pt encouraged to contact office with questions/complaints.   PP visit scheduled on 03/21/23 and pt agreed to appt.     Leonette Nutting  02/14/23

## 2023-02-15 ENCOUNTER — Inpatient Hospital Stay (HOSPITAL_COMMUNITY): Admission: RE | Admit: 2023-02-15 | Payer: Self-pay | Source: Home / Self Care | Admitting: Obstetrics & Gynecology

## 2023-02-15 ENCOUNTER — Inpatient Hospital Stay (HOSPITAL_COMMUNITY): Payer: Self-pay

## 2023-02-20 ENCOUNTER — Other Ambulatory Visit: Payer: Self-pay | Admitting: Obstetrics and Gynecology

## 2023-02-20 DIAGNOSIS — E039 Hypothyroidism, unspecified: Secondary | ICD-10-CM

## 2023-02-21 ENCOUNTER — Other Ambulatory Visit: Payer: Self-pay

## 2023-02-21 MED ORDER — LEVOTHYROXINE SODIUM 125 MCG PO TABS
125.0000 ug | ORAL_TABLET | Freq: Every day | ORAL | 1 refills | Status: DC
  Filled 2023-02-21: qty 30, 30d supply, fill #0
  Filled 2023-03-31: qty 30, 30d supply, fill #1
  Filled 2023-05-13 – 2023-05-17 (×2): qty 24, 24d supply, fill #2

## 2023-02-25 ENCOUNTER — Other Ambulatory Visit: Payer: Self-pay

## 2023-03-21 ENCOUNTER — Other Ambulatory Visit: Payer: Self-pay

## 2023-03-21 ENCOUNTER — Encounter: Payer: Self-pay | Admitting: Obstetrics and Gynecology

## 2023-03-21 ENCOUNTER — Ambulatory Visit: Payer: Self-pay | Admitting: Obstetrics and Gynecology

## 2023-03-21 DIAGNOSIS — Z975 Presence of (intrauterine) contraceptive device: Secondary | ICD-10-CM

## 2023-03-21 DIAGNOSIS — Z1231 Encounter for screening mammogram for malignant neoplasm of breast: Secondary | ICD-10-CM

## 2023-03-21 NOTE — Progress Notes (Signed)
 Post Partum Visit Note  Audrey Mcmillan is a 43 y.o. [phone] female who presents for a postpartum visit. She is 5 weeks postpartum following a primary cesarean section.  I have fully reviewed the prenatal and intrapartum course. The delivery was at 36/3 gestational weeks.  Anesthesia: spinal. Postpartum course has been uncomplicated. Baby is doing well. Baby is feeding by bottle - Similac Neosure. Bleeding thin lochia. Bowel function is normal. Bladder function is normal. Patient is not sexually active. Contraception method is IUD. Postpartum depression screening: negative.   The pregnancy intention screening data noted above was reviewed. Potential methods of contraception were discussed. The patient elected to proceed with No data recorded.   Edinburgh Postnatal Depression Scale - 03/21/23 1627       Edinburgh Postnatal Depression Scale:  In the Past 7 Days   I have been able to laugh and see the funny side of things. 0    I have looked forward with enjoyment to things. 0    I have blamed myself unnecessarily when things went wrong. 0    I have been anxious or worried for no good reason. 0    I have felt scared or panicky for no good reason. 0    Things have been getting on top of me. 1    I have been so unhappy that I have had difficulty sleeping. 0    I have felt sad or miserable. 0    I have been so unhappy that I have been crying. 0    The thought of harming myself has occurred to me. 0    Edinburgh Postnatal Depression Scale Total 1             Health Maintenance Due  Topic Date Due   OPHTHALMOLOGY EXAM  Never done   Pneumococcal Vaccine 53-69 Years old (2 of 2 - PCV) 06/28/2018   COVID-19 Vaccine (2 - 2024-25 season) 09/23/2022   FOOT EXAM  02/06/2023   HEMOGLOBIN A1C  02/28/2023    The following portions of the patient's history were reviewed and updated as appropriate: allergies, current medications, past family history, past medical history, past  social history, past surgical history, and problem list.  Review of Systems Pertinent items are noted in HPI.  Objective:  BP 136/80   Ht 5\' 2"  (1.575 m)   Wt 178 lb 3.2 oz (80.8 kg)   LMP  (LMP Unknown)   Breastfeeding No   BMI 32.59 kg/m    General:  alert, cooperative, and no distress   Breasts:  not indicated  Lungs: clear to auscultation bilaterally  Heart:  regular rate and rhythm  Abdomen: soft, non-tender; bowel sounds normal; no masses,  no organomegaly   Wound well approximated incision  GU exam:  normal      IUD strings were very long, per pt request, strings cut down to 2-3 cm Assessment:   Encounter for postpartum care  normal postpartum exam.   Plan:   Essential components of care per ACOG recommendations:  1.  Mood and well being: Patient with negative depression screening today. Reviewed local resources for support.  - Patient tobacco use? No.   - hx of drug use? No.    2. Infant care and feeding:  -Patient currently breastmilk feeding? No.  -Social determinants of health (SDOH) reviewed in EPIC. No concerns.  3. Sexuality, contraception and birth spacing - Patient does not want a pregnancy in the next year.  Desired family  size is 3 children.  - Reviewed reproductive life planning. Reviewed contraceptive methods based on pt preferences and effectiveness.  Patient desired IUD or IUS today.   - Discussed birth spacing of 18 months  4. Sleep and fatigue -Encouraged family/partner/community support of 4 hrs of uninterrupted sleep to help with mood and fatigue  5. Physical Recovery  - Discussed patients delivery and complications. She describes her labor as good. - Patient had a  cesarean section, nonreassuring fetal tracing . Patient expressed understanding - Patient has urinary incontinence? No. - Patient is safe to resume physical and sexual activity  6.  Health Maintenance - HM due items addressed Yes - Last pap smear  Diagnosis  Date Value  Ref Range Status  03/08/2021   Final   - Negative for intraepithelial lesion or malignancy (NILM)   Pap smear not done at today's visit.  -Breast Cancer screening indicated? Yes. Patient referred today for mammogram.   7. Chronic Disease/Pregnancy Condition follow ZO:XWRU 2 diabetes  - PCP follow up, pt advised to follow up with PCP to reassume management of DM  Warden Fillers, MD [facility] [employer]

## 2023-04-01 ENCOUNTER — Other Ambulatory Visit: Payer: Self-pay

## 2023-04-05 ENCOUNTER — Other Ambulatory Visit: Payer: Self-pay

## 2023-04-05 ENCOUNTER — Telehealth: Payer: Self-pay

## 2023-04-05 NOTE — Telephone Encounter (Signed)
 Telephoned patient using interpreter#[phone]. Left a voice message with [facility] contact information.

## 2023-05-06 IMAGING — US US MFM OB FOLLOW-UP
1 series · 13 of 28 positions shown · non-contrast
Comparison: none

[Series 1: us mfm ob follow-up · 68 acquisitions, 13 frames shown]
[im 3/68]
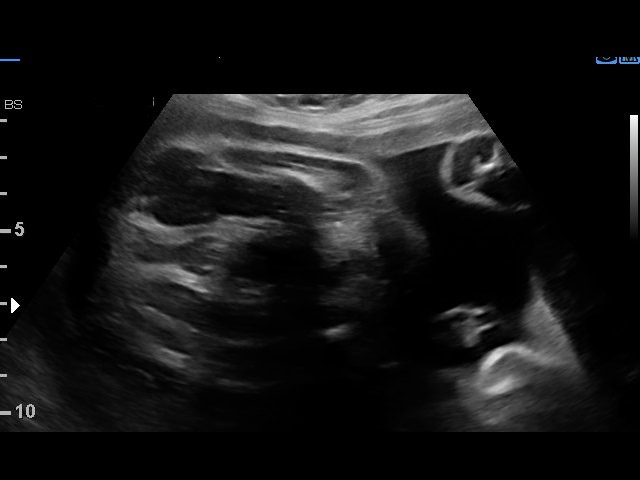
[im 8/68]
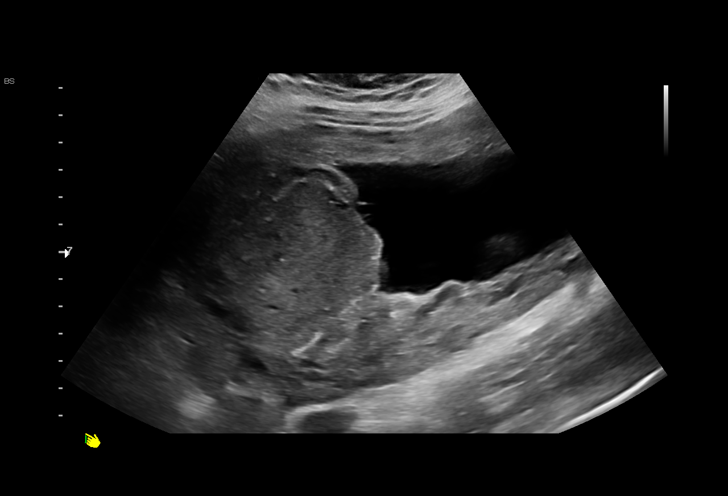
[im 13/68]
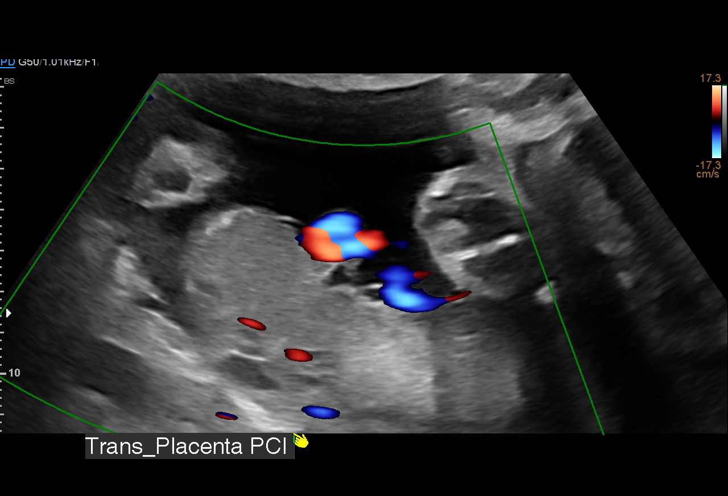
[im 18/68]
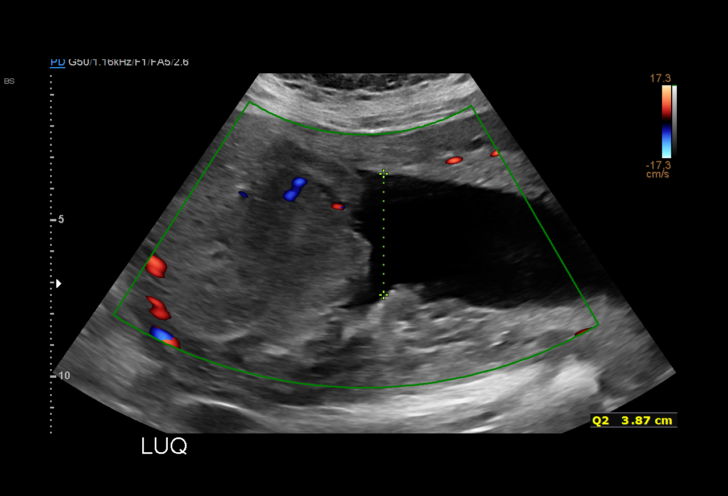
[im 23/68]
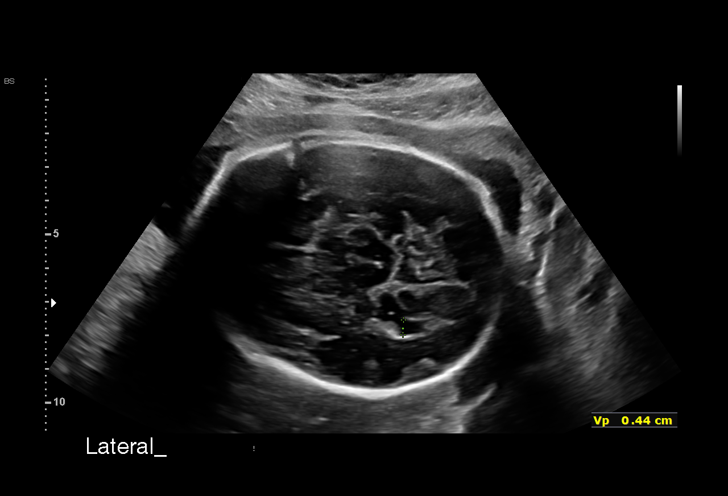
[im 28/68]
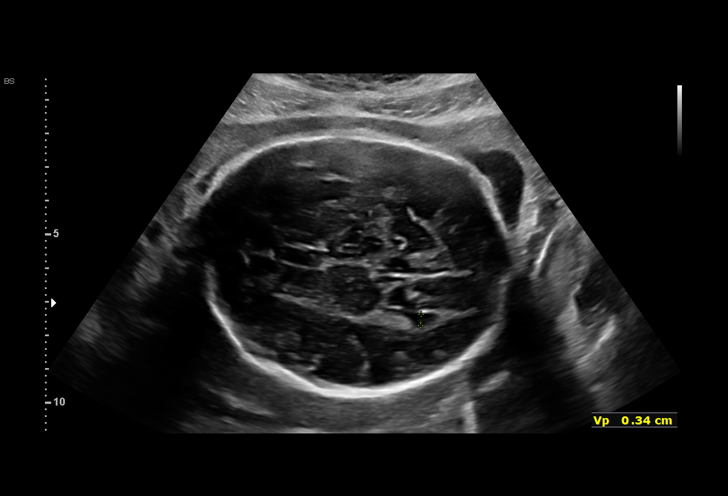
[im 35/68]
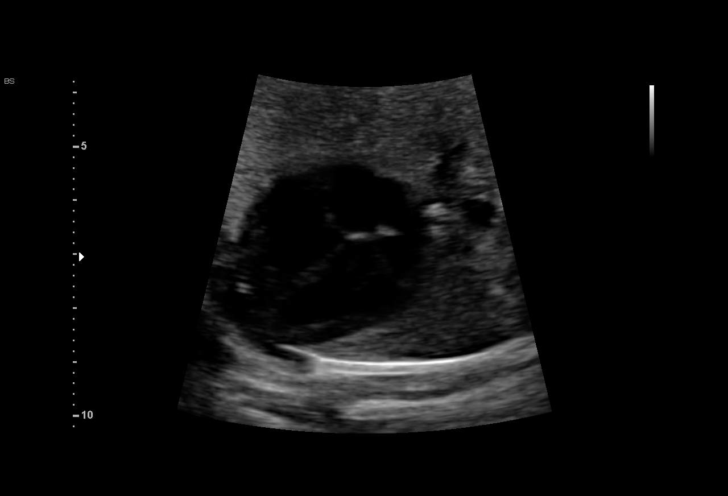
[im 40/68]
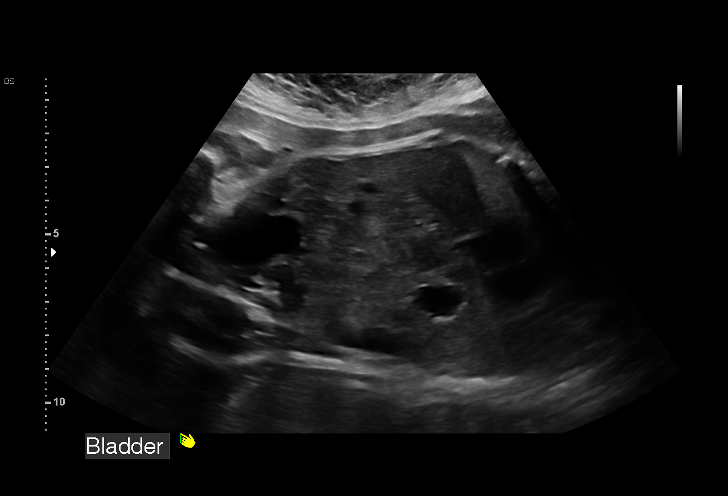
[im 45/68]
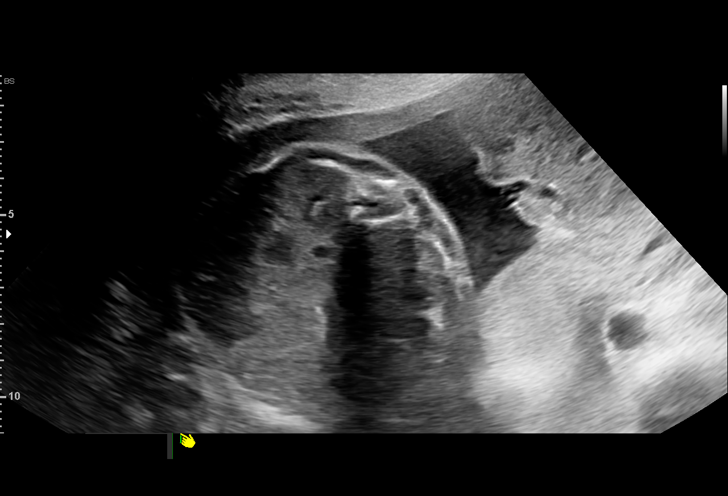
[im 50/68]
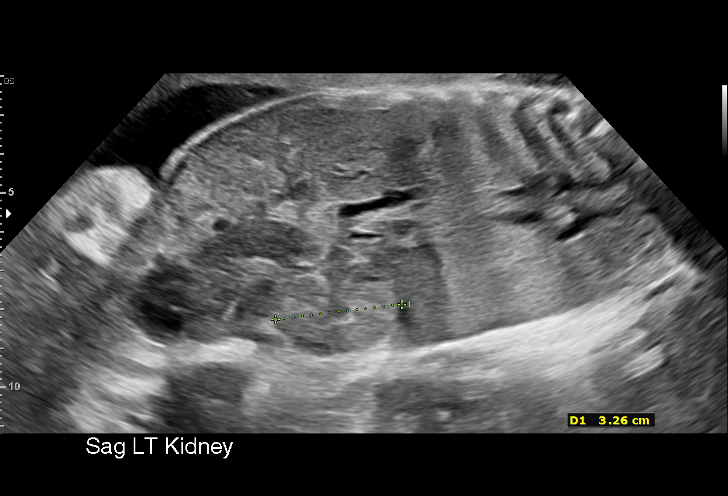
[im 55/68]
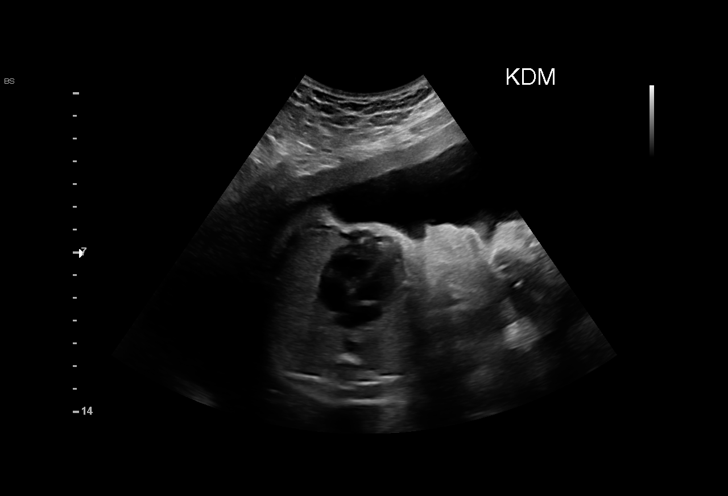
[im 60/68]
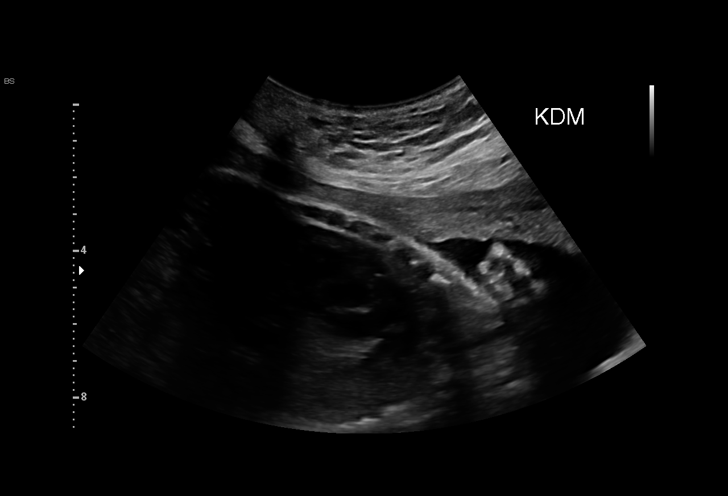
[im 65/68]
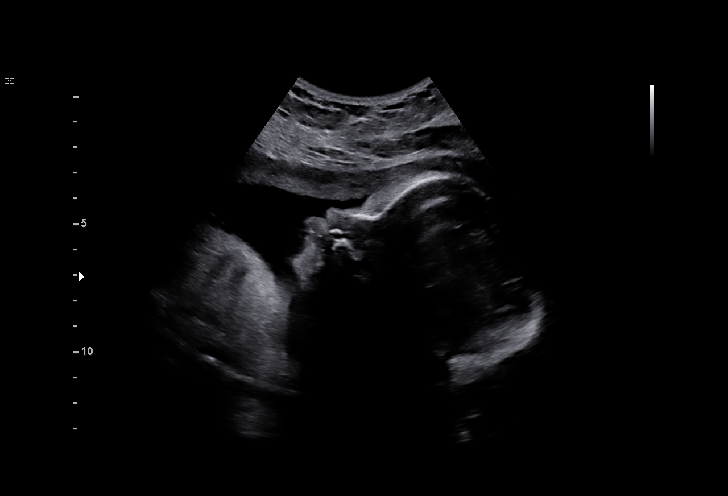

[13 of 28 positions shown; findings below may reference images not displayed]

MOLAY AHMED

                                                      ROZITA FERNANDES

Indications

 Pre-existing diabetes, type 2, in pregnancy,
 third trimester
 Advanced maternal age multigravida 35+,
 third trimester
 Hypothyroid (Synthroid)
 31 weeks gestation of pregnancy
 Low risk NIPS, Neg Horizon
Fetal Evaluation

 Num Of Fetuses:         1
 Fetal Heart Rate(bpm):  133
 Cardiac Activity:       Observed
 Presentation:           Cephalic
 Placenta:               Posterior
 P. Cord Insertion:      Marginal insertion

 Amniotic Fluid
 AFI FV:      Within normal limits

 AFI Sum(cm)     %Tile       Largest Pocket(cm)
 13.45           43

 RUQ(cm)       RLQ(cm)       LUQ(cm)        LLQ(cm)

Biophysical Evaluation

 Amniotic F.V:   Within normal limits       F. Tone:        Observed
 F. Movement:    Observed                   Score:          [DATE]
 F. Breathing:   Observed
Biometry

 BPD:      72.4  mm     G. Age:  29w 0d        < 1  %    CI:        69.64   %    70 - 86
                                                         FL/HC:      20.5   %    19.1 -
 HC:      276.9  mm     G. Age:  30w 2d        1.9  %    HC/AC:      1.00        0.96 -
 AC:      276.2  mm     G. Age:  31w 5d         51  %    FL/BPD:     78.5   %    71 - 87
 FL:       56.8  mm     G. Age:  29w 6d        4.7  %    FL/AC:      20.6   %    20 - 24
 LV:        3.9  mm

 Est. FW:    6019  gm    3 lb 10 oz      16  %
OB History

 Gravidity:    2         Term:   1        Prem:   0        SAB:   0
 TOP:          0       Ectopic:  0        Living: 1
Gestational Age

 LMP:           38w 4d        Date:  02/11/20                 EDD:   11/17/20
 U/S Today:     30w 2d                                        EDD:   01/14/21
 Best:          31w 4d     Det. By:  Early Ultrasound         EDD:   01/05/21
                                     (06/05/20)
Anatomy

 Cranium:               Appears normal         LVOT:                   Previously seen
 Cavum:                 Appears normal         Aortic Arch:            Previously seen
 Ventricles:            Appears normal         Ductal Arch:            Previously seen
 Choroid Plexus:        Previously seen        Diaphragm:              Appears normal
 Cerebellum:            Previously seen        Stomach:                Appears normal, left
                                                                       sided
 Posterior Fossa:       Previously seen        Abdomen:                Appears normal
 Nuchal Fold:           Previously seen        Abdominal Wall:         Previously seen
 Face:                  Orbits and profile     Cord Vessels:           Previously seen
                        previously seen
 Lips:                  Previously seen        Kidneys:                Appear normal
 Palate:                Previously seen        Bladder:                Appears normal
 Thoracic:              Appears normal         Spine:                  Previously seen
 Heart:                 Appears normal         Upper Extremities:      Previously seen
                        (4CH, axis, and
                        situs)
 RVOT:                  Previously seen        Lower Extremities:      Previously seen

 Other:  Fetus appears to be a male. 3VV and 3VTV visualized previously.
         Technically difficult due to advanced GA and fetal position.
Cervix Uterus Adnexa

 Cervix
 Not visualized (advanced GA >84wks)
 Uterus
 No abnormality visualized.

 Right Ovary
 Not visualized.

 Left Ovary
 Not visualized.

 Cul De Sac
 No free fluid seen.

 Adnexa
 No adnexal mass visualized.
Comments

 This patient was seen for a follow up growth scan due to
 advanced maternal age, pregestational diabetes treated with
 insulin, and hypothyroidism treated with Synthroid.  She
 denies any problems since her last exam.  She had a normal
 fetal echocardiogram performed with [HOSPITAL] pediatric
 cardiology.
 A biophysical profile performed today was [DATE].
 She was informed that the fetal growth and amniotic fluid
 level appears appropriate for her gestational age.
 Due to her underlying medical conditions, we will continue to
 follow her with weekly fetal testing until delivery.
 Another biophysical profile scheduled in 1 week.
 All conversations were held with the patient today with the
 help of a Spanish interpreter.

## 2023-05-14 ENCOUNTER — Other Ambulatory Visit: Payer: Self-pay

## 2023-05-17 ENCOUNTER — Other Ambulatory Visit: Payer: Self-pay

## 2023-05-23 ENCOUNTER — Other Ambulatory Visit: Payer: Self-pay

## 2023-05-31 MED FILL — Levonorgestrel IUD 20 MCG/DAY (Initial) (52 MG Total): INTRAUTERINE | Qty: 1 | Status: AC

## 2023-06-20 ENCOUNTER — Encounter (INDEPENDENT_AMBULATORY_CARE_PROVIDER_SITE_OTHER): Payer: Self-pay | Admitting: Primary Care

## 2023-06-20 ENCOUNTER — Ambulatory Visit (INDEPENDENT_AMBULATORY_CARE_PROVIDER_SITE_OTHER): Payer: Self-pay | Admitting: Primary Care

## 2023-06-20 ENCOUNTER — Other Ambulatory Visit: Payer: Self-pay

## 2023-06-20 VITALS — BP 152/90 | HR 76 | Resp 16 | Ht 62.0 in | Wt 184.8 lb

## 2023-06-20 DIAGNOSIS — E1142 Type 2 diabetes mellitus with diabetic polyneuropathy: Secondary | ICD-10-CM

## 2023-06-20 DIAGNOSIS — D509 Iron deficiency anemia, unspecified: Secondary | ICD-10-CM

## 2023-06-20 DIAGNOSIS — Z7985 Long-term (current) use of injectable non-insulin antidiabetic drugs: Secondary | ICD-10-CM

## 2023-06-20 DIAGNOSIS — Z794 Long term (current) use of insulin: Secondary | ICD-10-CM

## 2023-06-20 DIAGNOSIS — E785 Hyperlipidemia, unspecified: Secondary | ICD-10-CM

## 2023-06-20 DIAGNOSIS — I10 Essential (primary) hypertension: Secondary | ICD-10-CM

## 2023-06-20 DIAGNOSIS — E039 Hypothyroidism, unspecified: Secondary | ICD-10-CM

## 2023-06-20 DIAGNOSIS — E612 Magnesium deficiency: Secondary | ICD-10-CM

## 2023-06-20 DIAGNOSIS — E559 Vitamin D deficiency, unspecified: Secondary | ICD-10-CM

## 2023-06-20 LAB — POCT URINALYSIS DIP (CLINITEK)
Bilirubin, UA: NEGATIVE
Glucose, UA: >=1000 mg/dL — AB
Leukocytes, UA: NEGATIVE
Nitrite, UA: NEGATIVE
POC PROTEIN,UA: >=300 — AB
Spec Grav, UA: 1.02 (ref 1.010–1.025)
Urobilinogen, UA: 0.2 U/dL
pH, UA: 7 (ref 5.0–8.0)

## 2023-06-20 LAB — GLUCOSE, POCT (MANUAL RESULT ENTRY)
POC Glucose: 320 mg/dL — AB (ref 70–99)
POC Glucose: 361 mg/dL — AB (ref 70–99)

## 2023-06-20 LAB — POCT GLYCOSYLATED HEMOGLOBIN (HGB A1C): HbA1c, POC (controlled diabetic range): 10.9 % — AB (ref 0.0–7.0)

## 2023-06-20 MED ORDER — BASAGLAR KWIKPEN 100 UNIT/ML ~~LOC~~ SOPN
10.0000 [IU] | PEN_INJECTOR | Freq: Every day | SUBCUTANEOUS | 1 refills | Status: DC
  Filled 2023-06-20: qty 3, 30d supply, fill #0

## 2023-06-20 MED ORDER — INSULIN ASPART 100 UNIT/ML IJ SOLN
15.0000 [IU] | Freq: Once | INTRAMUSCULAR | Status: AC
  Administered 2023-06-20: 15 [IU] via SUBCUTANEOUS

## 2023-06-20 MED ORDER — VALSARTAN 40 MG PO TABS
40.0000 mg | ORAL_TABLET | Freq: Every day | ORAL | 3 refills | Status: DC
  Filled 2023-06-20: qty 90, 90d supply, fill #0

## 2023-06-20 MED ORDER — TRULICITY 0.75 MG/0.5ML ~~LOC~~ SOAJ
0.7500 mg | SUBCUTANEOUS | 1 refills | Status: DC
  Filled 2023-06-20: qty 2, 28d supply, fill #0
  Filled 2023-07-16: qty 2, 28d supply, fill #1

## 2023-06-20 NOTE — Progress Notes (Signed)
 [facility]  Audrey Mcmillan, is a 43 y.o. female  WGN:[id number]  HQI:[id number]  DOB - [birth year]  Chief Complaint  Patient presents with   Diabetes    CBG-361 A1C-10.9   Hypertension   Thyroid  Problem       Subjective:   Audrey Mcmillan is a 43 y.o. female here today to resume care last visit 06/19/2022.  PCP was not made aware by [facility] that she was pregnant and referral placed to OB/GYN.  She has not been on any medication due to finances unable to afford visits or medications.  She has a history of hypertension blood pressure is elevated.  Today is the first day that she has felt dizzy.  Patient has No headache, No chest pain, No abdominal pain - No Nausea, No new weakness tingling or numbness, No Cough - shortness of breath.  A1c is 10 and her urine has a trace of ketones blood sugar 361-she will drink 64 ounces of water  and treated with regular insulin  and reached out to clinical pharmacy for availability of medications.Denies polyuria, polydipsia, polyphasia or vision changes.  Does not check blood sugars at home.  After this visit she will be scheduled for, financial assistance paperwork will be given today and schedule a follow-up appointment next Wednesday are the following depending on her availability.  No problems updated.  Comprehensive ROS Pertinent positive and negative noted in HPI   No Known Allergies  Past Medical History:  Diagnosis Date   Diabetes mellitus without complication (HCC)    type 2   Gestational diabetes    Gestational hypertension, third trimester 11/14/2020   High cholesterol    Hyperthyroidism    Kidney stone complicating pregnancy, first trimester 06/06/2020   Marginal insertion of umbilical cord affecting management of mother 08/14/2020   Current Outpatient Medications on File Prior to Visit  Medication Sig Dispense Refill   acetaminophen  (TYLENOL ) 500 MG tablet Take 2 tablets (1,000  mg total) by mouth every 6 (six) hours. 30 tablet 0   glucose blood (TRUE METRIX BLOOD GLUCOSE TEST) test strip Use as instructed. Check blood glucose levels twice per day. 100 each 12   insulin  glargine (LANTUS ) 100 UNIT/ML Solostar Pen Inject 10 Units into the skin at bedtime. 15 mL 0   Insulin  Pen Needle (TECHLITE PEN NEEDLES) 32G X 4 MM MISC Use to inject insulin  up to 4 times daily as directed. 100 each 3   Insulin  Pen Needle 32G X 4 MM MISC Use three times daily with Humalog . 100 each 11   levothyroxine  (SYNTHROID ) 125 MCG tablet Take 1 tablet (125 mcg total) by mouth daily before breakfast. 42 tablet 1   TRUEplus Lancets 28G MISC Use as instructed. Check blood glucose levels twice per day. 100 each 3   [DISCONTINUED] insulin  aspart (NOVOLOG ) 100 UNIT/ML FlexPen Inject 8 Units into the skin 3 (three) times daily with meals. 15 mL 11   No current facility-administered medications on file prior to visit.   Health Maintenance  Topic Date Due   Eye exam for diabetics  Never done   Pneumococcal Vaccination (2 of 2 - PCV) 06/28/2018   COVID-19 Vaccine (2 - 2024-25 season) 09/23/2022   Yearly kidney health urinalysis for diabetes  02/06/2023   Complete foot exam   02/06/2023   Hemoglobin A1C  02/28/2023   Flu Shot  08/23/2023   Yearly kidney function blood test for diabetes  02/12/2024   Pap with HPV screening  03/08/2026  DTaP/Tdap/Td vaccine (3 - Td or Tdap) 12/12/2032   Hepatitis C Screening  Completed   HIV Screening  Completed   HPV Vaccine  Aged Out   Meningitis B Vaccine  Aged Out    Objective:   Vitals:   06/20/23 1008 06/20/23 1009  BP: (!) 159/96 (!) 159/97  Pulse: 76   Resp: 16   SpO2: 96%   Weight: 184 lb 12.8 oz (83.8 kg)   Height: 5\' 2"  (1.575 m)    BP Readings from Last 3 Encounters:  06/20/23 (!) 159/97  03/21/23 136/80  02/14/23 139/72      Physical Exam Vitals reviewed.  Constitutional:      Appearance: Normal appearance. She is obese.  HENT:      Head: Normocephalic.     Right Ear: Tympanic membrane, ear canal and external ear normal.     Left Ear: Tympanic membrane, ear canal and external ear normal.     Nose: Nose normal.     Mouth/Throat:     Mouth: Mucous membranes are moist.  Eyes:     Extraocular Movements: Extraocular movements intact.     Pupils: Pupils are equal, round, and reactive to light.  Cardiovascular:     Rate and Rhythm: Normal rate.  Pulmonary:     Effort: Pulmonary effort is normal.     Breath sounds: Normal breath sounds.  Abdominal:     General: Bowel sounds are normal.     Palpations: Abdomen is soft.  Musculoskeletal:        General: Normal range of motion.     Cervical back: Normal range of motion.  Skin:    General: Skin is warm and dry.  Neurological:     Mental Status: She is alert and oriented to person, place, and time.  Psychiatric:        Mood and Affect: Mood normal.        Behavior: Behavior normal.        Thought Content: Thought content normal.     Assessment & Plan  [name] was seen today for diabetes, hypertension and thyroid  problem.  Diagnoses and all orders for this visit:  Acquired hypothyroidism -     T3, free -     TSH + free T4  Type 2 diabetes mellitus with diabetic polyneuropathy, with long-term current use of insulin  (HCC)  Complications from uncontrolled diabetes -diabetic retinopathy leading to blindness, diabetic nephropathy leading to dialysis, decrease in circulation decrease in sores or wound healing which may lead to amputations and increase of heart attack and stroke . Effects on the liver- non-alcoholic fatty liver disease (NAFLD), which can progress to non-alcoholic steatohepatitis (NASH), cirrhosis, liver failure, and even liver cancer , lead to fat accumulation in the liver, causing NAFLD, and if left untreated, can lead to more severe liver damage.   Basaglar  10 units daily and Trulicity  0.75 mg weekly   -     CBC with Differential/Platelet -     POCT  glycosylated hemoglobin (Hb A1C) -     Microalbumin / creatinine urine ratio -     POCT glucose (manual entry) -     POCT URINALYSIS DIP (CLINITEK) -     insulin  aspart (novoLOG ) injection 15 Units  Essential hypertension BP goal - < 130/80 Explained that having normal blood pressure is the goal and medications are helping to get to goal and maintain normal blood pressure. DIET: Limit salt intake, read nutrition labels to check salt content, limit fried and  high fatty foods  Avoid using multisymptom OTC cold preparations that generally contain sudafed which can rise BP. Consult with pharmacist on best cold relief products to use for persons with HTN EXERCISE Discussed incorporating exercise such as walking - 30 minutes most days of the week and can do in 10 minute intervals    -     CMP14+EGFR  Hyperlipidemia, unspecified hyperlipidemia type -     Lipid panel  Vitamin D  deficiency VITAMIN D  25 Hydroxy (Vit-D Deficiency, Fractures)   Magnesium  deficiency -     Magnesium   Iron deficiency anemia, unspecified iron deficiency anemia type -     Iron, TIBC and Ferritin Panel     Patient have been counseled extensively about nutrition and exercise. Other issues discussed during this visit include: low cholesterol diet, weight control and daily exercise, foot care, annual eye examinations at Ophthalmology, importance of adherence with medications and regular follow-up. We also discussed long term complications of uncontrolled diabetes and hypertension.   Return in about 3 months (around 09/20/2023).  The patient was given clear instructions to go to ER or return to medical center if symptoms don't improve, worsen or new problems develop. The patient verbalized understanding. The patient was told to call to get lab results if they haven't heard anything in the next week.   This note has been created with Education officer, environmental. Any transcriptional  errors are unintentional.   Marius Siemens, NP 06/20/2023, 10:35 AM

## 2023-06-21 ENCOUNTER — Other Ambulatory Visit (INDEPENDENT_AMBULATORY_CARE_PROVIDER_SITE_OTHER): Payer: Self-pay | Admitting: Primary Care

## 2023-06-21 ENCOUNTER — Telehealth (INDEPENDENT_AMBULATORY_CARE_PROVIDER_SITE_OTHER): Payer: Self-pay

## 2023-06-21 ENCOUNTER — Encounter (INDEPENDENT_AMBULATORY_CARE_PROVIDER_SITE_OTHER): Payer: Self-pay | Admitting: Primary Care

## 2023-06-21 ENCOUNTER — Ambulatory Visit (INDEPENDENT_AMBULATORY_CARE_PROVIDER_SITE_OTHER): Payer: Self-pay

## 2023-06-21 ENCOUNTER — Other Ambulatory Visit: Payer: Self-pay

## 2023-06-21 DIAGNOSIS — E039 Hypothyroidism, unspecified: Secondary | ICD-10-CM

## 2023-06-21 DIAGNOSIS — E782 Mixed hyperlipidemia: Secondary | ICD-10-CM

## 2023-06-21 DIAGNOSIS — E559 Vitamin D deficiency, unspecified: Secondary | ICD-10-CM

## 2023-06-21 MED ORDER — ERGOCALCIFEROL 1.25 MG (50000 UT) PO CAPS
50000.0000 [IU] | ORAL_CAPSULE | ORAL | 0 refills | Status: DC
  Filled 2023-06-21: qty 5, 35d supply, fill #0
  Filled 2023-07-10: qty 4, 28d supply, fill #0
  Filled 2023-08-05: qty 4, 28d supply, fill #1

## 2023-06-21 MED ORDER — GEMFIBROZIL 600 MG PO TABS
600.0000 mg | ORAL_TABLET | Freq: Two times a day (BID) | ORAL | 1 refills | Status: DC
  Filled 2023-06-21 – 2023-07-10 (×2): qty 60, 30d supply, fill #0

## 2023-06-21 MED ORDER — LEVOTHYROXINE SODIUM 75 MCG PO TABS
75.0000 ug | ORAL_TABLET | Freq: Every day | ORAL | 3 refills | Status: DC
  Filled 2023-06-21: qty 90, 90d supply, fill #0
  Filled 2023-07-10: qty 30, 30d supply, fill #0

## 2023-06-21 MED ORDER — OMEGA-3-ACID ETHYL ESTERS 1 G PO CAPS
2.0000 g | ORAL_CAPSULE | Freq: Two times a day (BID) | ORAL | 1 refills | Status: DC
  Filled 2023-06-21 – 2023-07-10 (×2): qty 120, 30d supply, fill #0

## 2023-06-21 MED ORDER — PRAVASTATIN SODIUM 80 MG PO TABS
80.0000 mg | ORAL_TABLET | Freq: Every day | ORAL | 3 refills | Status: DC
  Filled 2023-06-21 – 2023-07-10 (×2): qty 30, 30d supply, fill #0

## 2023-06-21 NOTE — Telephone Encounter (Signed)
Contacted pt to go over lab results pt didn't answer lvm asking pt to give a call back at her earliest convenience  

## 2023-06-22 LAB — CMP14+EGFR
ALT: 29 IU/L (ref 0–32)
AST: 15 IU/L (ref 0–40)
Albumin: 3.8 g/dL — ABNORMAL LOW (ref 3.9–4.9)
Alkaline Phosphatase: 122 IU/L — ABNORMAL HIGH (ref 44–121)
BUN/Creatinine Ratio: 18 (ref 9–23)
BUN: 15 mg/dL (ref 6–24)
Bilirubin Total: 0.5 mg/dL (ref 0.0–1.2)
CO2: 21 mmol/L (ref 20–29)
Calcium: 9.3 mg/dL (ref 8.7–10.2)
Chloride: 98 mmol/L (ref 96–106)
Creatinine, Ser: 0.85 mg/dL (ref 0.57–1.00)
Globulin, Total: 2.8 g/dL (ref 1.5–4.5)
Glucose: 281 mg/dL — ABNORMAL HIGH (ref 70–99)
Potassium: 4.1 mmol/L (ref 3.5–5.2)
Sodium: 135 mmol/L (ref 134–144)
Total Protein: 6.6 g/dL (ref 6.0–8.5)
eGFR: 88 mL/min/{1.73_m2} (ref >59)

## 2023-06-22 LAB — CBC WITH DIFFERENTIAL/PLATELET
Basophils Absolute: 0 10*3/uL (ref 0.0–0.2)
Basos: 0 %
EOS (ABSOLUTE): 0.2 10*3/uL (ref 0.0–0.4)
Eos: 2 %
Hematocrit: 48 % — ABNORMAL HIGH (ref 34.0–46.6)
Hemoglobin: 15.9 g/dL (ref 11.1–15.9)
Immature Grans (Abs): 0 10*3/uL (ref 0.0–0.1)
Immature Granulocytes: 0 %
Lymphocytes Absolute: 2.2 10*3/uL (ref 0.7–3.1)
Lymphs: 24 %
MCH: 29.9 pg (ref 26.6–33.0)
MCHC: 33.1 g/dL (ref 31.5–35.7)
MCV: 90 fL (ref 79–97)
Monocytes Absolute: 0.5 10*3/uL (ref 0.1–0.9)
Monocytes: 5 %
Neutrophils Absolute: 6.3 10*3/uL (ref 1.4–7.0)
Neutrophils: 69 %
Platelets: 314 10*3/uL (ref 150–450)
RBC: 5.32 x10E6/uL — ABNORMAL HIGH (ref 3.77–5.28)
RDW: 14.4 % (ref 11.7–15.4)
WBC: 9.1 10*3/uL (ref 3.4–10.8)

## 2023-06-22 LAB — LIPID PANEL
Chol/HDL Ratio: 12.7 ratio — ABNORMAL HIGH (ref 0.0–4.4)
Cholesterol, Total: 433 mg/dL — ABNORMAL HIGH (ref 100–199)
HDL: 34 mg/dL — ABNORMAL LOW (ref >39)
Triglycerides: 1164 mg/dL (ref 0–149)

## 2023-06-22 LAB — T3, FREE: T3, Free: 1.6 pg/mL — ABNORMAL LOW (ref 2.0–4.4)

## 2023-06-22 LAB — VITAMIN D 25 HYDROXY (VIT D DEFICIENCY, FRACTURES): Vit D, 25-Hydroxy: 14.5 ng/mL — ABNORMAL LOW (ref 30.0–100.0)

## 2023-06-22 LAB — MAGNESIUM: Magnesium: 2.1 mg/dL (ref 1.6–2.3)

## 2023-06-22 LAB — IRON,TIBC AND FERRITIN PANEL
Ferritin: 59 ng/mL (ref 15–150)
Iron Saturation: 34 % (ref 15–55)
Iron: 102 ug/dL (ref 27–159)
Total Iron Binding Capacity: 298 ug/dL (ref 250–450)
UIBC: 196 ug/dL (ref 131–425)

## 2023-06-22 LAB — TSH+FREE T4
Free T4: 0.68 ng/dL — ABNORMAL LOW (ref 0.82–1.77)
TSH: 38.9 u[IU]/mL — ABNORMAL HIGH (ref 0.450–4.500)

## 2023-06-22 LAB — MICROALBUMIN / CREATININE URINE RATIO
Creatinine, Urine: 49.8 mg/dL
Microalb/Creat Ratio: 4985 mg/g{creat} — ABNORMAL HIGH (ref 0–29)
Microalbumin, Urine: 2482.5 ug/mL

## 2023-06-26 ENCOUNTER — Ambulatory Visit (INDEPENDENT_AMBULATORY_CARE_PROVIDER_SITE_OTHER): Payer: Self-pay

## 2023-06-26 ENCOUNTER — Telehealth (INDEPENDENT_AMBULATORY_CARE_PROVIDER_SITE_OTHER): Payer: Self-pay

## 2023-06-26 ENCOUNTER — Ambulatory Visit (INDEPENDENT_AMBULATORY_CARE_PROVIDER_SITE_OTHER): Payer: Self-pay | Admitting: Primary Care

## 2023-06-26 NOTE — Telephone Encounter (Signed)
 Copied from CRM [id number]. Topic: Appointments - Scheduling Inquiry for Clinic >> Jun 26, 2023  1:43 PM Elle L wrote: Reason for CRM: The patient is requesting to reschedule her financial counseling appointment. The patient's call back number is [phone].

## 2023-07-03 ENCOUNTER — Other Ambulatory Visit: Payer: Self-pay

## 2023-07-09 ENCOUNTER — Telehealth (INDEPENDENT_AMBULATORY_CARE_PROVIDER_SITE_OTHER): Payer: Self-pay | Admitting: Primary Care

## 2023-07-09 NOTE — Telephone Encounter (Signed)
 Called pt to confirm appt.Pt did not answer and could not LVM.

## 2023-07-10 ENCOUNTER — Ambulatory Visit (INDEPENDENT_AMBULATORY_CARE_PROVIDER_SITE_OTHER): Payer: Self-pay | Admitting: Primary Care

## 2023-07-10 ENCOUNTER — Encounter (INDEPENDENT_AMBULATORY_CARE_PROVIDER_SITE_OTHER): Payer: Self-pay | Admitting: Primary Care

## 2023-07-10 ENCOUNTER — Other Ambulatory Visit: Payer: Self-pay

## 2023-07-10 VITALS — BP 136/84 | HR 95 | Resp 16

## 2023-07-10 DIAGNOSIS — Z794 Long term (current) use of insulin: Secondary | ICD-10-CM

## 2023-07-10 DIAGNOSIS — I10 Essential (primary) hypertension: Secondary | ICD-10-CM

## 2023-07-10 DIAGNOSIS — Z7985 Long-term (current) use of injectable non-insulin antidiabetic drugs: Secondary | ICD-10-CM

## 2023-07-10 DIAGNOSIS — E1142 Type 2 diabetes mellitus with diabetic polyneuropathy: Secondary | ICD-10-CM

## 2023-07-10 DIAGNOSIS — E039 Hypothyroidism, unspecified: Secondary | ICD-10-CM

## 2023-07-10 MED ORDER — LEVOTHYROXINE SODIUM 137 MCG PO TABS
75.0000 ug | ORAL_TABLET | Freq: Every day | ORAL | 1 refills | Status: DC
  Filled 2023-07-10: qty 45, 90d supply, fill #0

## 2023-07-10 NOTE — Progress Notes (Signed)
 [facility]  Audrey Mcmillan, is a 43 y.o. female  ZOX:[id number]  WJX:[id number]  DOB - Jan 06, 1981  Chief Complaint  Patient presents with   Blood Pressure Check       Subjective:   Audrey Mcmillan is a 43 y.o. female here today for an acute.  Blood pressure is 136/84 in proving.  She is taking valsartan  40 mg in the morning.  Patient voices concerns about what medication she needed to be on for her diabetes.  Did she still need to do sliding scale.  No medication has not been ordered recently.  Only medication she should be taking for diabetes is Trulicity  and Basaglar  patient verbalizes understanding.  Question patient about her levothyroxine  75 mcg she takes daily patient informed that medication was increased to 125 mcg during pregnancy and she just took her last pill today.  She she endorses she takes her medication every day and on an empty stomach levels of thyroid  are elevated.  Will increase levothyroxine  to 137 mcg as prescribed recheck in 6 weeks-to 8 HPI  No problems updated.  Comprehensive ROS Pertinent positive and negative noted in HPI   No Known Allergies  Past Medical History:  Diagnosis Date   Diabetes mellitus without complication (HCC)    type 2   Gestational diabetes    Gestational hypertension, third trimester 11/14/2020   High cholesterol    Hyperthyroidism    Kidney stone complicating pregnancy, first trimester 06/06/2020   Marginal insertion of umbilical cord affecting management of mother 08/14/2020    Current Outpatient Medications on File Prior to Visit  Medication Sig Dispense Refill   acetaminophen  (TYLENOL ) 500 MG tablet Take 2 tablets (1,000 mg total) by mouth every 6 (six) hours. 30 tablet 0   Dulaglutide  (TRULICITY ) 0.75 MG/0.5ML SOAJ Inject 0.75 mg into the skin once a week. 2 mL 1   ergocalciferol  (VITAMIN D2) 1.25 MG (50000 UT) capsule Take 1 capsule (50,000 Units total) by mouth once a week. 10 capsule 0    gemfibrozil  (LOPID ) 600 MG tablet Take 1 tablet (600 mg total) by mouth 2 (two) times daily before a meal. 180 tablet 1   glucose blood (TRUE METRIX BLOOD GLUCOSE TEST) test strip Use as instructed. Check blood glucose levels twice per day. 100 each 12   Insulin  Glargine (BASAGLAR  KWIKPEN) 100 UNIT/ML Inject 10 Units into the skin daily. 15 mL 1   Insulin  Pen Needle (TECHLITE PEN NEEDLES) 32G X 4 MM MISC Use to inject insulin  up to 4 times daily as directed. 100 each 3   Insulin  Pen Needle 32G X 4 MM MISC Use three times daily with Humalog . 100 each 11   levothyroxine  (SYNTHROID ) 75 MCG tablet Take 1 tablet (75 mcg total) by mouth daily. 90 tablet 3   omega-3 acid ethyl esters (LOVAZA ) 1 g capsule Take 2 capsules (2 g total) by mouth 2 (two) times daily. 180 capsule 1   pravastatin  (PRAVACHOL ) 80 MG tablet Take 1 tablet (80 mg total) by mouth daily. 90 tablet 3   TRUEplus Lancets 28G MISC Use as instructed. Check blood glucose levels twice per day. 100 each 3   valsartan  (DIOVAN ) 40 MG tablet Take 1 tablet (40 mg total) by mouth daily. 90 tablet 3   [DISCONTINUED] insulin  aspart (NOVOLOG ) 100 UNIT/ML FlexPen Inject 8 Units into the skin 3 (three) times daily with meals. 15 mL 11   No current facility-administered medications on file prior to visit.   Health Maintenance  Topic Date  Due   Eye exam for diabetics  Never done   HPV Vaccine (1 - 3-dose SCDM series) Never done   Pneumococcal Vaccination (2 of 2 - PCV) 06/28/2018   COVID-19 Vaccine (2 - 2024-25 season) 09/23/2022   Complete foot exam   02/06/2023   Flu Shot  08/23/2023   Hemoglobin A1C  12/21/2023   Yearly kidney function blood test for diabetes  06/19/2024   Yearly kidney health urinalysis for diabetes  06/19/2024   Pap with HPV screening  03/08/2026   DTaP/Tdap/Td vaccine (3 - Td or Tdap) 12/12/2032   Hepatitis C Screening  Completed   HIV Screening  Completed   Meningitis B Vaccine  Aged Out    Objective:   Vitals:    07/10/23 1545 07/10/23 1546  BP: (!) 145/86 136/84  Pulse: 95   Resp: 16   SpO2: 96%    BP Readings from Last 3 Encounters:  07/10/23 136/84  06/20/23 (!) 152/90  03/21/23 136/80      Physical Exam Vitals reviewed.  Constitutional:      Appearance: Normal appearance. She is obese.  HENT:     Head: Normocephalic.     Right Ear: External ear normal.     Left Ear: External ear normal.     Nose: Nose normal.     Mouth/Throat:     Mouth: Mucous membranes are moist.   Eyes:     Extraocular Movements: Extraocular movements intact.    Cardiovascular:     Rate and Rhythm: Normal rate and regular rhythm.  Pulmonary:     Effort: Pulmonary effort is normal.     Breath sounds: Normal breath sounds.  Abdominal:     General: Bowel sounds are normal. There is distension.     Palpations: Abdomen is soft.   Musculoskeletal:        General: Normal range of motion.     Cervical back: Normal range of motion and neck supple.   Skin:    General: Skin is warm and dry.   Neurological:     Mental Status: She is alert and oriented to person, place, and time.   Psychiatric:        Mood and Affect: Mood normal.        Behavior: Behavior normal.        Thought Content: Thought content normal.       Assessment & Plan   There are no diagnoses linked to this encounter.   Patient have been counseled extensively about nutrition and exercise. Other issues discussed during this visit include: low cholesterol diet, weight control and daily exercise, foot care, annual eye examinations at Ophthalmology, importance of adherence with medications and regular follow-up. We also discussed long term complications of uncontrolled diabetes and hypertension.  Audrey Mcmillan was seen today for blood pressure check.  Diagnoses and all orders for this visit:  Essential hypertension Improving on valsartan  BP goal - < 130/80 Explained that having normal blood pressure is the goal and medications are helping to  get to goal and maintain normal blood pressure. DIET: Limit salt intake, read nutrition labels to check salt content, limit fried and high fatty foods  Avoid using multisymptom OTC cold preparations that generally contain sudafed which can rise BP. Consult with pharmacist on best cold relief products to use for persons with HTN EXERCISE Discussed incorporating exercise such as walking - 30 minutes most days of the week and can do in 10 minute intervals     Acquired hypothyroidism  Increased levothyroxine  to 137 mcg daily was not aware that patient was taking 125 mcg until today.  Levels are elevated.  Patient is aware that thyroid  controls everything in her body  Type 2 diabetes mellitus with diabetic polyneuropathy, with long-term current use of insulin  (HCC) Question was concerning what insulin  she was supposed to be on no sliding scale only Basaglar  and Trulicity   The patient was given clear instructions to go to ER or return to medical center if symptoms don't improve, worsen or new problems develop. The patient verbalized understanding. The patient was told to call to get lab results if they haven't heard anything in the next week.   This note has been created with Education officer, environmental. Any transcriptional errors are unintentional.   Marius Siemens, NP 07/10/2023, 4:11 PM

## 2023-07-12 ENCOUNTER — Other Ambulatory Visit: Payer: Self-pay

## 2023-07-17 ENCOUNTER — Other Ambulatory Visit: Payer: Self-pay

## 2023-07-17 ENCOUNTER — Other Ambulatory Visit (HOSPITAL_COMMUNITY): Payer: Self-pay

## 2023-07-18 ENCOUNTER — Other Ambulatory Visit: Payer: Self-pay

## 2023-07-22 ENCOUNTER — Encounter: Payer: Self-pay | Admitting: Pharmacist

## 2023-07-22 ENCOUNTER — Other Ambulatory Visit: Payer: Self-pay

## 2023-07-22 ENCOUNTER — Encounter: Payer: Self-pay | Admitting: Primary Care

## 2023-07-22 ENCOUNTER — Ambulatory Visit: Payer: Self-pay | Attending: Family Medicine | Admitting: Pharmacist

## 2023-07-22 DIAGNOSIS — E039 Hypothyroidism, unspecified: Secondary | ICD-10-CM

## 2023-07-22 DIAGNOSIS — Z7985 Long-term (current) use of injectable non-insulin antidiabetic drugs: Secondary | ICD-10-CM

## 2023-07-22 DIAGNOSIS — I10 Essential (primary) hypertension: Secondary | ICD-10-CM

## 2023-07-22 DIAGNOSIS — Z794 Long term (current) use of insulin: Secondary | ICD-10-CM

## 2023-07-22 DIAGNOSIS — E1142 Type 2 diabetes mellitus with diabetic polyneuropathy: Secondary | ICD-10-CM

## 2023-07-22 MED ORDER — FENOFIBRATE 145 MG PO TABS
145.0000 mg | ORAL_TABLET | Freq: Every day | ORAL | 1 refills | Status: DC
  Filled 2023-07-22: qty 30, 30d supply, fill #0
  Filled 2023-08-23: qty 30, 30d supply, fill #1
  Filled 2023-09-27: qty 30, 30d supply, fill #2

## 2023-07-22 MED ORDER — ROSUVASTATIN CALCIUM 5 MG PO TABS
5.0000 mg | ORAL_TABLET | Freq: Every day | ORAL | 1 refills | Status: DC
  Filled 2023-07-22: qty 90, 90d supply, fill #0

## 2023-07-22 NOTE — Progress Notes (Signed)
 S:    PCP: Audrey Mcmillan   No chief complaint on file.  PMH significant for T2DM with neuropathy, HTN, HLD/hypertriglyceridemia/elevated lp(a), hypothyroidism.   Patient arrives well and in good spirits.  Presents for diabetes evaluation, education, and management.   Patient was seen by her PCP after ~1 year of not having routine primary care. BP was 152/90 mmHg at that visit, A1c was 10.9 (up from 7.3 prior), and TG levels were >1000! At that time, PCP restarted insulin  and added Trulicity . She also placed the patient on valsartan . Finally, she started the patient on pravastatin  + gemfibrozil  + Lovaza  for lipid control.   Subsequently, pt was seen on 07/10/2023. BP showed better control. She endorsed adherence to her medication regimen. She was referred to me for further management.   Today, patient reports adherence to medications. Tolerating Trulicity  well. Denies NV, abdominal pain. No changes in vision. She tells me she does note a decreased appetite. Patient denies hypoglycemic events. She eats 3 meals/day and has incorporated more non-starchy vegetables in her diet since last visit. Has never had a taste for sweets or sugar-sweetened beverage. In terms of exercise, she is walking 30-40 minutes daily. Since restarting medications, her polyuria and polydipsia has resolved. Neuropathy is stable. Denies any changes in vision. She reports glucose readings from 143-190 mg/dL.  Regarding her HTN, she denies any chest pains, dyspnea, or HA. She is having some dizziness but attributes this to getting back on her medications after not being on them for a while. Adherent to her valsartan  daily. BP at last appt showed improvement at 136/84 (down from 152/90 in May).   Regarding her HLD, she is adherent to her pravastatin , Lovaza , and gemfibrozil . Denies any muscle pain or fatigue.   Lastly, her thyroid  levels were out of range with Audrey Mcmillan. Her levothyroxine  dose was adjusted and she is due for  recheck of labs today.   Family/Social History:  -Fhx: unknown by the patient -Tobacco: never smoker -Alcohol: none reported  Insurance coverage/medication affordability: Self pay  Medication adherence reported. Current diabetes medications include: Basaglar  10 units daily, Trulicity  0.75 mg weekly  Current HTN medications include: valsartan  40 mg daily Current antihyperlipidemic medications include: gemfibrozil  600 mg BID, Lovaza  2g BID, pravastatin  80 mg daily  O:   Lab Results  Component Value Date   HGBA1C 10.9 (A) 06/20/2023   There were no vitals filed for this visit.  Lipid Panel     Component Value Date/Time   CHOL 433 (H) 06/20/2023 1115   TRIG 1,164 (HH) 06/20/2023 1115   HDL 34 (L) 06/20/2023 1115   CHOLHDL 12.7 (H) 06/20/2023 1115   LDLCALC Comment (A) 06/20/2023 1115   Clinical Atherosclerotic Cardiovascular Disease (ASCVD): No  The ASCVD Risk score (Arnett DK, et al., 2019) failed to calculate for the following reasons:   The valid total cholesterol range is 130 to 320 mg/dL    A/P: Diabetes longstanding currently uncontrolled, however, reported home CBG values reveal improvement! Commended patient on this. Patient is able to verbalize appropriate hypoglycemia management plan. Medication adherence is now optimal -Continue Basaglar  10units once daily in the morning.  -Continue Trulicity  to 0.75 mg once weekly for now to allow her to further adjust.  -Extensively discussed pathophysiology of diabetes, recommended lifestyle interventions, dietary effects on blood sugar control -Counseled on s/sx of and management of hypoglycemia -Next A1C anticipated 08/2023  Hypertension longstanding, currently above goal but improving. Goal BP is <130/80 mmHg. She is adherent to her valsartan .  Will recheck CMP today and address titration at next visit.  - Continue valsartan  40 mg daily.  - Reviewed long term cardiovascular and renal outcomes of uncontrolled blood pressure -  CMP14+eGFR.   ASCVD risk - primary prevention in patient with diabetes. Last LDL was not calculated due to severe hypertriglyceridemia. LDL is not at goal of <70 mg/dL. At this point, fenofibrate would be safer than gemfibrozil  and is recommended in this setting mainly to protect her from pancreatitis. Will stop gemfibrozil  and Lovaza  and start fenofibrate instead. Additionally, I recommend to stop pravastatin  and start rosuvastatin instead. We will look to elevate her to high intensity statin therapy based on labs next month.  -Stop pravastatin , Lovaza , and gemfibrozil .  -Start rosuvastatin 5 mg daily.  -Start fenofibrate 145 mg daily. -Anticipate lipid at follow-up.  Written patient instructions provided.  Total time in face to face counseling 20 minutes.   Follow up PCP Clinic Visit in one month.   Audrey Mcmillan, PharmD, Audrey Mcmillan, CPP Clinical Pharmacist [facility] [phone]

## 2023-07-23 LAB — THYROID PANEL WITH TSH
Free Thyroxine Index: 2.1 (ref 1.2–4.9)
T3 Uptake Ratio: 26 % (ref 24–39)
T4, Total: 8 ug/dL (ref 4.5–12.0)
TSH: 14.7 u[IU]/mL — ABNORMAL HIGH (ref 0.450–4.500)

## 2023-07-23 LAB — CMP14+EGFR
ALT: 58 IU/L — ABNORMAL HIGH (ref 0–32)
AST: 35 IU/L (ref 0–40)
Albumin: 4.4 g/dL (ref 3.9–4.9)
Alkaline Phosphatase: 151 IU/L — ABNORMAL HIGH (ref 44–121)
BUN/Creatinine Ratio: 14 (ref 9–23)
BUN: 17 mg/dL (ref 6–24)
Bilirubin Total: 0.6 mg/dL (ref 0.0–1.2)
CO2: 18 mmol/L — ABNORMAL LOW (ref 20–29)
Calcium: 10.2 mg/dL (ref 8.7–10.2)
Chloride: 97 mmol/L (ref 96–106)
Creatinine, Ser: 1.21 mg/dL — ABNORMAL HIGH (ref 0.57–1.00)
Globulin, Total: 2.9 g/dL (ref 1.5–4.5)
Glucose: 442 mg/dL — ABNORMAL HIGH (ref 70–99)
Potassium: 5 mmol/L (ref 3.5–5.2)
Sodium: 133 mmol/L — ABNORMAL LOW (ref 134–144)
Total Protein: 7.3 g/dL (ref 6.0–8.5)
eGFR: 58 mL/min/{1.73_m2} — ABNORMAL LOW (ref >59)

## 2023-07-24 ENCOUNTER — Telehealth: Payer: Self-pay | Admitting: Pharmacist

## 2023-07-24 NOTE — Telephone Encounter (Signed)
 Routing to provider covering for [place].

## 2023-07-24 NOTE — Telephone Encounter (Signed)
 Please see results from my encounter 07/22/23. If you could assist me by contacting the patient. Her thyroid  situation is complex - I am summarizing below. I have several recommendations:   TSH/Thyroid :  - Saw PCP 06/20/23. Results revealed hypothyroid. She was taking levothyroxine  125 mcg daily at the time. - PCP increased to 137 mcg daily but the rxn was sent in for her to take a 1/2 tablet. I'm not sure if this was a mistake. As she was hypothyroid it would not make sense that her dose be decreased. - I saw her 07/22/23. She reported adherence to her thyroid  medication but did not pick-up and start new dose until 07/12/2023.  - When I saw her, she did not tell me if she was taking a whole or a half tablet of the 137 mcg.  - I rechecked levels and her TSH showed improvement, but she is still subtherapeutic.  - Since there was improvement, I would assume pt is taking a full 137 mcg tablet. But we need to confirm. - Can we confirm that she has been taking a whole tablet of the 137 mcg vs 1/2 tablet? If she has been taking the whole tablet (137 mcg) I recommend to continue this dose since she just started on 07/12/2023.  - Please let me know what she reports and I will update her rxn.   CMP:  - Glucose elevated. - She was not fasting and had not taken her insulin  yet that day.  - She recently started Trulicity  and Basaglar .  - I will follow-up with further DM medication modification in 1 month, however, I recommend for her to increase Basaglar  to 16 units daily and continue Trulicity  once a week.   - Creatinine increased to 1.21 (up from 0.85). - Creatinine blip is expected since she recently started valsartan .  - While this is ~42% increase from 06/20/2023, her readings in Jan of this year in the hospital ranged from 1.09 - 1.52.  - Therefore, I recommend to continue valsartan  and increase hydration to 48 - 64 oz of water  daily. Better hydration and blood sugar control should help her Scr improve.  -  I will plan on rechecking this when I see her at follow-up.   - Liver enzymes increased, likely due to the combination of gemfibrozil  + statin + poor lipid/glycemic control.  - I changed her to a lower dose rosuvastatin, stopped gemfibrozil , and started fenofibrate. This should help her LFTs stabilize.  - I recommend to continue with therapy for now and recheck LFTs at follow-up with me.

## 2023-07-24 NOTE — Telephone Encounter (Signed)
 [place] please confirm the dose she is taking if a whole tablet and which dosage she has. I agree with luke. We will need to follow up with new labs for Thyroid  level in 4 weeks and determine if it needs to be adjusted or not.

## 2023-07-25 ENCOUNTER — Encounter (INDEPENDENT_AMBULATORY_CARE_PROVIDER_SITE_OTHER): Payer: Self-pay

## 2023-07-25 NOTE — Telephone Encounter (Signed)
 Looks like pt had thyroid  levels checked on 07/22/23

## 2023-07-25 NOTE — Telephone Encounter (Signed)
 Yes however she just started taking the new dose of thyroid  medication on the 20th so the labs drawn on the 30th would not be accurate in regard to adjusting her dosage at this point and time. Once we find out if she is taking a whole or half tablet she will needs labs repeated in 4 weeks on the dosage she just started on the 20th. Thanks!

## 2023-07-25 NOTE — Telephone Encounter (Signed)
 Reached out to pt to confirm medication dosage pt didn't answer lvm  Sent MyChart message

## 2023-07-27 ENCOUNTER — Ambulatory Visit: Payer: Self-pay | Admitting: Primary Care

## 2023-08-02 NOTE — Telephone Encounter (Signed)
 [place] interperter: angela Id: [id number]  Contacted pt to go over provider message pt didn't answer lvm

## 2023-08-05 ENCOUNTER — Encounter (INDEPENDENT_AMBULATORY_CARE_PROVIDER_SITE_OTHER): Payer: Self-pay

## 2023-08-05 ENCOUNTER — Other Ambulatory Visit (INDEPENDENT_AMBULATORY_CARE_PROVIDER_SITE_OTHER): Payer: Self-pay | Admitting: Primary Care

## 2023-08-05 NOTE — Telephone Encounter (Signed)
 Tried contacting pt again pt didn't answer lvm  Will mail letter  Audrey Mcmillan pt has an appt with you 7/31 could you recheck thyroid  levels

## 2023-08-06 ENCOUNTER — Other Ambulatory Visit: Payer: Self-pay

## 2023-08-07 ENCOUNTER — Other Ambulatory Visit: Payer: Self-pay

## 2023-08-07 MED ORDER — TRULICITY 0.75 MG/0.5ML ~~LOC~~ SOAJ
0.7500 mg | SUBCUTANEOUS | 0 refills | Status: DC
  Filled 2023-08-07: qty 2, 28d supply, fill #0

## 2023-08-07 NOTE — Telephone Encounter (Signed)
 Requested Prescriptions  Pending Prescriptions Disp Refills   Dulaglutide  (TRULICITY ) 0.75 MG/0.5ML SOAJ 2 mL 0    Sig: Inject 0.75 mg into the skin once a week.     Endocrinology:  Diabetes - GLP-1 Receptor Agonists Failed - 08/07/2023 11:59 AM      Failed - HBA1C is between 0 and 7.9 and within 180 days    HbA1c, POC (controlled diabetic range)  Date Value Ref Range Status  06/20/2023 10.9 (A) 0.0 - 7.0 % Final         Passed - Valid encounter within last 6 months    Recent Outpatient Visits           2 weeks ago Acquired hypothyroidism   [facility] [place] [place] Fleeta Morris, Garnette CROME, RPH-CPP   4 weeks ago Essential hypertension   [place] [facility] Celestia Rosaline SQUIBB, NP   1 month ago Acquired hypothyroidism   [place] [facility] Celestia Rosaline SQUIBB, NP   1 year ago Type 2 diabetes mellitus with diabetic polyneuropathy, with long-term current use of insulin  [facility])   [place] [facility] Celestia Rosaline SQUIBB, NP   1 year ago Type 2 diabetes mellitus with diabetic polyneuropathy, with long-term current use of insulin  [facility])   [place] [facility] Celestia Rosaline SQUIBB, NP

## 2023-08-09 ENCOUNTER — Other Ambulatory Visit: Payer: Self-pay

## 2023-08-21 ENCOUNTER — Telehealth: Payer: Self-pay | Admitting: Primary Care

## 2023-08-21 NOTE — Telephone Encounter (Signed)
 Confirmed appt for 7*/31

## 2023-08-22 ENCOUNTER — Other Ambulatory Visit: Payer: Self-pay

## 2023-08-22 ENCOUNTER — Ambulatory Visit: Payer: Self-pay | Attending: Primary Care | Admitting: Pharmacist

## 2023-08-22 ENCOUNTER — Encounter: Payer: Self-pay | Admitting: Pharmacist

## 2023-08-22 VITALS — BP 143/87 | HR 73

## 2023-08-22 DIAGNOSIS — I10 Essential (primary) hypertension: Secondary | ICD-10-CM

## 2023-08-22 DIAGNOSIS — Z7985 Long-term (current) use of injectable non-insulin antidiabetic drugs: Secondary | ICD-10-CM

## 2023-08-22 DIAGNOSIS — Z975 Presence of (intrauterine) contraceptive device: Secondary | ICD-10-CM

## 2023-08-22 DIAGNOSIS — E1142 Type 2 diabetes mellitus with diabetic polyneuropathy: Secondary | ICD-10-CM

## 2023-08-22 DIAGNOSIS — E039 Hypothyroidism, unspecified: Secondary | ICD-10-CM

## 2023-08-22 DIAGNOSIS — Z794 Long term (current) use of insulin: Secondary | ICD-10-CM

## 2023-08-22 MED ORDER — LEVOTHYROXINE SODIUM 137 MCG PO TABS
137.0000 ug | ORAL_TABLET | Freq: Every day | ORAL | 1 refills | Status: DC
  Filled 2023-08-22: qty 90, 90d supply, fill #0

## 2023-08-22 MED ORDER — BASAGLAR KWIKPEN 100 UNIT/ML ~~LOC~~ SOPN
16.0000 [IU] | PEN_INJECTOR | Freq: Every day | SUBCUTANEOUS | 1 refills | Status: DC
  Filled 2023-08-22: qty 6, 37d supply, fill #0

## 2023-08-22 NOTE — Progress Notes (Signed)
 S:    PCP: Rosaline   No chief complaint on file.  PMH significant for T2DM with neuropathy, HTN, HLD/hypertriglyceridemia/elevated lp(a), hypothyroidism.   Patient arrives well and in good spirits.  Presents for diabetes evaluation, education, and management.   Patient was seen by her PCP on 07/10/2023 after ~1 year of not having routine primary care. At that time, PCP restarted insulin  and added Trulicity  due to Audrey Mcmillan's A1c being 10.9. She also placed the patient on valsartan  for HTN. Finally, she started the patient on pravastatin  + gemfibrozil  + Lovaza  for lipid control.   Subsequently, pt was seen on 07/10/2023. BP showed better control. She endorsed adherence to her medication regimen. She was referred to me for further management.   I saw her on 07/22/2023. I did not make any changes to her DM medications. I did not change her valsartan . Labs done at  that visit showed a Scr up to 1.21 (from 0.85 1 month prior). Her liver enzymes were also elevated. Of note, I changed her lipid management medications to rosuvastatin  + fenofibrate  (she was on a combination of Lovaza  + gemfibrozil  + pravastatin ) prior to that appointment. Lastly, I had her continue her 137 mcg dose of levothyroxine  and got a TSH panel. Her TSH had improved but showed that she was still hypothyroid. As there was question as to whether she was taking a full tablet vs a half, I had her continue with 137 mcg daily (full tablet).  Today, patient reports adherence to medications. Tolerating Trulicity  well. She has been taking this now for a couple of months. No changes in vision. She tells me she does note a decreased appetite. Patient denies any hypoglycemia or symptoms of hyperglycemia. She reports glucose readings at home in the 150 - 190 mg/dL range.   Regarding her HTN, she denies any chest pains, dyspnea, or HA. Adherent to her valsartan  daily. BP at last appt showed improvement at 136/84 (down from 152/90 in May).    Regarding her HLD, she is adherent to her rosuvastatin  + fenofibrate . Denies any muscle pain or fatigue.   Lastly, her thyroid  levels were out of range last month but improved (14.7 down from 38.9). She confirms today she has been adherent to her levothyroxine  dose. She takes 137 mcg (1 full tablet) daily. Still endorses fatigue.   In addition to the above, Audrey Mcmillan endorses some nausea that is new. Endorses 2-3 episodes of emesis over the past 2 weeks. Denies any abdominal pain but endorses some cramping. She has noticed increased flow and tells me she is currently menstruating. Her flow is heavier than normal and she attributes her increased fatigue to this. She is now 6-7 months postpartum following a cesarean section. [facility]. She had a Mirena  IUD placed during cesarean delivery in Jan of this year.   Family/Social History:  -Fhx: unknown by the patient -Tobacco: never smoker -Alcohol: none reported  Insurance coverage/medication affordability: Self pay  Medication adherence reported. Current diabetes medications include: Basaglar  16 units daily (takes 10 units daily), Trulicity  0.75 mg weekly  Current HTN medications include: valsartan  40 mg daily Current antihyperlipidemic medications include: fenofibrate  145 mg daily, rosuvastatin  5 mg   O:   Lab Results  Component Value Date   HGBA1C 10.9 (A) 06/20/2023   Vitals:   08/22/23 1024  BP: (!) 143/87  Pulse: 73   Lipid Panel     Component Value Date/Time   CHOL 433 (H) 06/20/2023  1115   TRIG 1,164 (HH) 06/20/2023 1115   HDL 34 (L) 06/20/2023 1115   CHOLHDL 12.7 (H) 06/20/2023 1115   LDLCALC Comment (A) 06/20/2023 1115   Clinical Atherosclerotic Cardiovascular Disease (ASCVD): No  The ASCVD Risk score (Arnett DK, et al., 2019) failed to calculate for the following reasons:   The valid total cholesterol range is 130 to 320 mg/dL   A/P: Diabetes longstanding currently uncontrolled,  however, reported home CBG values reveal improvement! Commended patient on this. Patient is able to verbalize appropriate hypoglycemia management plan. Medication adherence is suboptimal. -Increase Basaglar  to 16 units once daily in the morning.  -Continue Trulicity  to 0.75 mg once weekly for now. -Extensively discussed pathophysiology of diabetes, recommended lifestyle interventions, dietary effects on blood sugar control -Counseled on s/sx of and management of hypoglycemia -Next A1C anticipated 08/2023  Hypertension longstanding, currently above goal. Goal BP is <130/80 mmHg. She is adherent to her valsartan . Will recheck CMP today. If Scr and K allow, we can increase valsartan  dose. If not, we will need to add amlodipine instead. - Continue valsartan  40 mg daily.  - Reviewed long term cardiovascular and renal outcomes of uncontrolled blood pressure - CMP14+eGFR.   ASCVD risk - primary prevention in patient with diabetes. Last LDL was not calculated due to severe hypertriglyceridemia. LDL is not at goal of <70 mg/dL. She is adherent to rosuvastatin  + fenfofibrate. Will check liver function and lipid panel today.  -Continue rosuvastatin  5 mg daily.  -Continue fenofibrate  145 mg daily. -Lipid, CMP14+eGFR.  Thyroid  - patient  is clinically hypothyroid with last TSH of 14.7 (07/22/2023). Will check another thyroid  panel today and call her with results. Confirmed that she has been taking levothyroxine  137 mcg for at least the last 8 weeks.  -Thyroid  panel w/ TSH.  IUD (intra-uterine device) in place - pt with new onset menstrual bleeding increased in volume over the past 2 weeks. Endorses fatigue, abdominal cramping.  -Encouraged her to contact and schedule with [facility]   Written patient instructions provided.  Total time in face to face counseling 45 minutes.   Follow up PCP Clinic Visit: 09/04/2023 Follow up with me: 09/27/2023  Audrey Mcmillan, PharmD, BCACP, CPP Clinical  Pharmacist [facility] [phone]

## 2023-08-23 ENCOUNTER — Telehealth: Payer: Self-pay | Admitting: Pharmacist

## 2023-08-23 ENCOUNTER — Ambulatory Visit: Payer: Self-pay | Admitting: Primary Care

## 2023-08-23 ENCOUNTER — Other Ambulatory Visit: Payer: Self-pay | Admitting: Primary Care

## 2023-08-23 ENCOUNTER — Other Ambulatory Visit: Payer: Self-pay

## 2023-08-23 DIAGNOSIS — E039 Hypothyroidism, unspecified: Secondary | ICD-10-CM

## 2023-08-23 LAB — CMP14+EGFR
ALT: 30 IU/L (ref 0–32)
AST: 16 IU/L (ref 0–40)
Albumin: 4.2 g/dL (ref 3.9–4.9)
Alkaline Phosphatase: 139 IU/L — ABNORMAL HIGH (ref 44–121)
BUN/Creatinine Ratio: 9 (ref 9–23)
BUN: 10 mg/dL (ref 6–24)
Bilirubin Total: 0.3 mg/dL (ref 0.0–1.2)
CO2: 19 mmol/L — ABNORMAL LOW (ref 20–29)
Calcium: 10.2 mg/dL (ref 8.7–10.2)
Chloride: 96 mmol/L (ref 96–106)
Creatinine, Ser: 1.13 mg/dL — ABNORMAL HIGH (ref 0.57–1.00)
Globulin, Total: 2.9 g/dL (ref 1.5–4.5)
Glucose: 475 mg/dL — ABNORMAL HIGH (ref 70–99)
Potassium: 4.7 mmol/L (ref 3.5–5.2)
Sodium: 131 mmol/L — ABNORMAL LOW (ref 134–144)
Total Protein: 7.1 g/dL (ref 6.0–8.5)
eGFR: 63 mL/min/1.73 (ref >59)

## 2023-08-23 LAB — THYROID PANEL WITH TSH
Free Thyroxine Index: 2 (ref 1.2–4.9)
T3 Uptake Ratio: 27 % (ref 24–39)
T4, Total: 7.4 ug/dL (ref 4.5–12.0)
TSH: 18.8 u[IU]/mL — ABNORMAL HIGH (ref 0.450–4.500)

## 2023-08-23 LAB — CBC
Hematocrit: 43.9 % (ref 34.0–46.6)
Hemoglobin: 14.3 g/dL (ref 11.1–15.9)
MCH: 31 pg (ref 26.6–33.0)
MCHC: 32.6 g/dL (ref 31.5–35.7)
MCV: 95 fL (ref 79–97)
Platelets: 289 x10E3/uL (ref 150–450)
RBC: 4.62 x10E6/uL (ref 3.77–5.28)
RDW: 12.6 % (ref 11.7–15.4)
WBC: 8.3 x10E3/uL (ref 3.4–10.8)

## 2023-08-23 LAB — LIPID PANEL
Chol/HDL Ratio: 8.3 ratio — ABNORMAL HIGH (ref 0.0–4.4)
Cholesterol, Total: 258 mg/dL — ABNORMAL HIGH (ref 100–199)
HDL: 31 mg/dL — ABNORMAL LOW (ref >39)
Triglycerides: 857 mg/dL (ref 0–149)

## 2023-08-23 MED ORDER — ROSUVASTATIN CALCIUM 40 MG PO TABS
40.0000 mg | ORAL_TABLET | Freq: Every day | ORAL | 1 refills | Status: DC
  Filled 2023-08-23: qty 90, 90d supply, fill #0

## 2023-08-23 MED ORDER — ROSUVASTATIN CALCIUM 20 MG PO TABS
20.0000 mg | ORAL_TABLET | Freq: Every day | ORAL | 3 refills | Status: DC
  Filled 2023-08-23: qty 30, 30d supply, fill #0

## 2023-08-23 MED ORDER — VALSARTAN 80 MG PO TABS
80.0000 mg | ORAL_TABLET | Freq: Every day | ORAL | 3 refills | Status: AC
  Filled 2023-08-23: qty 30, 30d supply, fill #0
  Filled 2023-09-27: qty 30, 30d supply, fill #1
  Filled 2023-12-16: qty 30, 30d supply, fill #2
  Filled 2024-01-09: qty 30, 30d supply, fill #3

## 2023-08-23 MED ORDER — LEVOTHYROXINE SODIUM 150 MCG PO TABS
150.0000 ug | ORAL_TABLET | Freq: Every day | ORAL | 1 refills | Status: DC
  Filled 2023-08-23: qty 30, 30d supply, fill #0
  Filled 2023-09-27 (×2): qty 30, 30d supply, fill #1

## 2023-08-23 NOTE — Telephone Encounter (Signed)
 Labs reviewed. Glucose is elevated despite reported home levels being close to goal. I had her increase her Lantus  from 10 units to 16 units yesterday. TSH shows continued hypothyroid and she has been compliant with 137 mcg daily. I recommend dose increase to 150 mcg once daily. If appropriate, can you send this in? Or give me the okay to send? She wishes to pick up today. BP was elevated yesterday. Scr and K are nl and stable. I sent an increased valsartan  dose to her pharmacy this morning. Lastly, lipids are improving but still elevated. Sent in high intensity statin and encouraged adherence to this and fenofibrate .

## 2023-08-26 ENCOUNTER — Encounter (INDEPENDENT_AMBULATORY_CARE_PROVIDER_SITE_OTHER): Payer: Self-pay

## 2023-09-03 ENCOUNTER — Telehealth (INDEPENDENT_AMBULATORY_CARE_PROVIDER_SITE_OTHER): Payer: Self-pay | Admitting: Primary Care

## 2023-09-03 NOTE — Telephone Encounter (Signed)
 Called pt to confirm appt. Pt will be present.

## 2023-09-04 ENCOUNTER — Ambulatory Visit (INDEPENDENT_AMBULATORY_CARE_PROVIDER_SITE_OTHER): Payer: Self-pay | Admitting: Primary Care

## 2023-09-11 ENCOUNTER — Other Ambulatory Visit (INDEPENDENT_AMBULATORY_CARE_PROVIDER_SITE_OTHER): Payer: Self-pay | Admitting: Primary Care

## 2023-09-12 ENCOUNTER — Other Ambulatory Visit: Payer: Self-pay

## 2023-09-12 ENCOUNTER — Telehealth: Payer: Self-pay

## 2023-09-12 MED ORDER — TRULICITY 0.75 MG/0.5ML ~~LOC~~ SOAJ
0.7500 mg | SUBCUTANEOUS | 2 refills | Status: DC
  Filled 2023-09-12: qty 2, 28d supply, fill #0

## 2023-09-12 NOTE — Telephone Encounter (Signed)
 Patient telephoned, used interpreter, Julie Sowell. Mailed patient a mammogram scholarship application.

## 2023-09-12 NOTE — Telephone Encounter (Signed)
 Requested Prescriptions  Pending Prescriptions Disp Refills   Dulaglutide  (TRULICITY ) 0.75 MG/0.5ML SOAJ 2 mL 2    Sig: Inject 0.75 mg into the skin once a week.     Endocrinology:  Diabetes - GLP-1 Receptor Agonists Failed - 09/12/2023  4:37 PM      Failed - HBA1C is between 0 and 7.9 and within 180 days    HbA1c, POC (controlled diabetic range)  Date Value Ref Range Status  06/20/2023 10.9 (A) 0.0 - 7.0 % Final         Passed - Valid encounter within last 6 months    Recent Outpatient Visits           3 weeks ago Type 2 diabetes mellitus with diabetic polyneuropathy, with long-term current use of insulin  (HCC)   [facility] [employer]. [facility] Fleeta Tonia Garnette LITTIE, RPH-CPP   1 month ago Acquired hypothyroidism   [facility] Dept Of [place]. [facility] Fleeta Tonia Garnette LITTIE, RPH-CPP   2 months ago Essential hypertension   [place] [facility] Celestia Rosaline SQUIBB, NP   2 months ago Acquired hypothyroidism   [place] [facility] Celestia Rosaline SQUIBB, NP   1 year ago Type 2 diabetes mellitus with diabetic polyneuropathy, with long-term current use of insulin  [facility])   [place] [facility] Celestia Rosaline SQUIBB, NP

## 2023-09-13 ENCOUNTER — Other Ambulatory Visit: Payer: Self-pay

## 2023-09-24 ENCOUNTER — Ambulatory Visit (INDEPENDENT_AMBULATORY_CARE_PROVIDER_SITE_OTHER): Payer: Self-pay | Admitting: Primary Care

## 2023-09-25 ENCOUNTER — Telehealth: Payer: Self-pay | Admitting: Primary Care

## 2023-09-25 NOTE — Telephone Encounter (Signed)
 Patient confirmed appointment for 09/27/2023, through volunteer call.

## 2023-09-26 NOTE — Progress Notes (Unsigned)
 S:    PCP: Rosaline Bohr   No chief complaint on file.  PMH significant for T2DM with neuropathy, HTN, HLD/hypertriglyceridemia/elevated lp(a), hypothyroidism.   Patient arrives well and in good spirits.  Presents for diabetes evaluation, education, and management. Patient is due for an A1c check today.   Patient was seen by her PCP on 07/10/2023 after ~1 year of not having routine primary care. At that time, PCP restarted insulin  and added Trulicity  due to A1c being 10.9. She also started the patient on valsartan  for HTN. Finally, she started the patient on pravastatin  + gemfibrozil  + Lovaza  for lipid control. Subsequently, pt was seen on 07/10/2023. BP showed better control. She endorsed adherence to her medication regimen. She was referred to me for further management.   Pharmacy saw her on 07/22/2023 and we did not make any changes to her DM medications or HTN medications. Labs done at  that visit showed a Scr up to 1.21 (from 0.85 1 month prior). Her liver enzymes were also elevated. Of note, we changed her lipid management medications to rosuvastatin  + fenofibrate  (she was on a combination of Lovaza  + gemfibrozil  + pravastatin ) prior to that appointment. Lastly, we  continued her 137 mcg dose of levothyroxine  and got a TSH panel. Her TSH had improved but showed that she was still hypothyroid. As there was question as to whether she was taking a full tablet vs a half, I had her continue with 137 mcg daily (full tablet). Confirmed with patient that they have been taking the 137 mcg dose for at least 8 weeks.   At last visit on 08/22/23, her thyroid  levels were out of range but improved (14.7 down from 38.9). She confirmed adherence to her levothyroxine  dose (137 mcg (1 full tablet) daily). Still endorses fatigue. In addition to the above, Ms. Carmilla endorsed some nausea that is new. Endorsed 2-3 episodes of emesis over the past 2 weeks. Denied any abdominal pain but was experiencing some  cramping. She has noticed increased flow and tells me she is currently menstruating. Her flow is heavier than normal and she attributes her increased fatigue to this. [facility]. She had a Mirena  IUD placed during cesarean delivery in Jan of this year. Patient reported adherence to diabetes medication and has had no hypo/hyperglycemia symptoms. Reported glucose readings in th 150-190 range.   Due to patient's recent lab work, clinical pharmacist increased levothyroxine  to 150 mcg daily, changed basaglar  to 16 units daily, increased valsartan  to 80 mg daily, and continued on rosuvastatin  40 mg daily and fenofibrate  145 mg daily. Trulicity  0.75 mg weekly was continued at the current dose.   Family/Social History:  -Fhx: unknown by the patient -Tobacco: never smoker -Alcohol: none reported  Insurance coverage/medication affordability: Self pay  Medication adherence reported. Current diabetes medications include: Basaglar  16 units daily, Trulicity  0.75 mg weekly  Current HTN medications include: valsartan  80 mg daily Current antihyperlipidemic medications include: fenofibrate  145 mg daily, rosuvastatin  40 mg   Patient-reported glucose readings:  Fasting) Post-prandial)   Hypoglycemia symptoms?  Polyuria? Neuropathy? Changes in vision? Foot exams?   Any other reported side effects?     O:   Lab Results  Component Value Date   HGBA1C 10.9 (A) 06/20/2023   There were no vitals filed for this visit.  Lipid Panel     Component Value Date/Time   CHOL 258 (H) 08/22/2023 0951   TRIG 857 (HH) 08/22/2023 0951   HDL 31 (  L) 08/22/2023 0951   CHOLHDL 8.3 (H) 08/22/2023 0951   LDLCALC Comment (A) 08/22/2023 0951   Clinical Atherosclerotic Cardiovascular Disease (ASCVD): No  The 10-year ASCVD risk score (Arnett DK, et al., 2019) is: 9.3%   Values used to calculate the score:     Age: 41 years     Clincally relevant sex: Female     Is Non-Hispanic African  American: No     Diabetic: Yes     Tobacco smoker: No     Systolic Blood Pressure: 143 mmHg     Is BP treated: Yes     HDL Cholesterol: 31 mg/dL     Total Cholesterol: 258 mg/dL   A/P: Diabetes longstanding currently uncontrolled, however, reported home CBG values reveal improvement! Commended patient on this. Patient is able to verbalize appropriate hypoglycemia management plan. Medication adherence is suboptimal. -Increase Basaglar  to 16 units once daily in the morning.  -Continue Trulicity  to 0.75 mg once weekly for now. -Extensively discussed pathophysiology of diabetes, recommended lifestyle interventions, dietary effects on blood sugar control -Counseled on s/sx of and management of hypoglycemia -Next A1C anticipated ________  Hypertension longstanding, currently above goal with a BP today of ______ (Goal BP is <130/80 mmHg). She is adherent to her valsartan .  - Continue valsartan  80 mg daily.  - Reviewed long term cardiovascular and renal outcomes of uncontrolled blood pressure   ASCVD risk - primary prevention in patient with diabetes. Last LDL was not calculated due to severe hypertriglyceridemia. LDL is not at goal of <70 mg/dL. She is adherent to rosuvastatin  + fenofibrate .  -Continue rosuvastatin  40 mg daily.  -Continue fenofibrate  145 mg daily.   Written patient instructions provided.  Total time in face to face counseling 45 minutes.   Follow up PCP Clinic Visit: 09/04/2023 Follow up with pharmacy in 1 month   Patient seen with: Tinnie Norrie, PharmD Candidate  Herlene Fleeta Morris, PharmD, BCACP, CPP Clinical Pharmacist [facility] & [facility] [phone]

## 2023-09-27 ENCOUNTER — Ambulatory Visit: Payer: Self-pay | Attending: Family Medicine | Admitting: Pharmacist

## 2023-09-27 ENCOUNTER — Other Ambulatory Visit: Payer: Self-pay

## 2023-09-27 ENCOUNTER — Encounter: Payer: Self-pay | Admitting: Pharmacist

## 2023-09-27 VITALS — BP 119/76 | HR 72

## 2023-09-27 DIAGNOSIS — I10 Essential (primary) hypertension: Secondary | ICD-10-CM

## 2023-09-27 DIAGNOSIS — Z7985 Long-term (current) use of injectable non-insulin antidiabetic drugs: Secondary | ICD-10-CM

## 2023-09-27 DIAGNOSIS — E1142 Type 2 diabetes mellitus with diabetic polyneuropathy: Secondary | ICD-10-CM

## 2023-09-27 DIAGNOSIS — Z794 Long term (current) use of insulin: Secondary | ICD-10-CM

## 2023-09-27 LAB — POCT GLYCOSYLATED HEMOGLOBIN (HGB A1C): HbA1c, POC (controlled diabetic range): 14.4 % — AB (ref 0.0–7.0)

## 2023-09-27 MED ORDER — TRULICITY 1.5 MG/0.5ML ~~LOC~~ SOAJ
1.5000 mg | SUBCUTANEOUS | 1 refills | Status: AC
  Filled 2023-09-27: qty 2, 28d supply, fill #0
  Filled 2023-10-24: qty 2, 28d supply, fill #1
  Filled 2023-11-21: qty 2, 28d supply, fill #2
  Filled 2023-12-16: qty 2, 28d supply, fill #3
  Filled 2024-01-09: qty 2, 28d supply, fill #4
  Filled 2024-02-12: qty 2, 28d supply, fill #5

## 2023-09-27 MED ORDER — BASAGLAR KWIKPEN 100 UNIT/ML ~~LOC~~ SOPN
22.0000 [IU] | PEN_INJECTOR | Freq: Every day | SUBCUTANEOUS | 1 refills | Status: DC
  Filled 2023-09-27: qty 6, 27d supply, fill #0

## 2023-09-27 MED ORDER — INSULIN LISPRO (1 UNIT DIAL) 100 UNIT/ML (KWIKPEN)
4.0000 [IU] | PEN_INJECTOR | Freq: Every day | SUBCUTANEOUS | 2 refills | Status: AC
  Filled 2023-09-27: qty 3, 28d supply, fill #0
  Filled 2023-12-16: qty 3, 28d supply, fill #1
  Filled 2024-01-09: qty 3, 28d supply, fill #2

## 2023-09-28 LAB — CMP14+EGFR
ALT: 29 IU/L (ref 0–32)
AST: 22 IU/L (ref 0–40)
Albumin: 4.2 g/dL (ref 3.9–4.9)
Alkaline Phosphatase: 89 IU/L (ref 44–121)
BUN/Creatinine Ratio: 15 (ref 9–23)
BUN: 18 mg/dL (ref 6–24)
Bilirubin Total: 0.3 mg/dL (ref 0.0–1.2)
CO2: 20 mmol/L (ref 20–29)
Calcium: 9.9 mg/dL (ref 8.7–10.2)
Chloride: 98 mmol/L (ref 96–106)
Creatinine, Ser: 1.23 mg/dL — ABNORMAL HIGH (ref 0.57–1.00)
Globulin, Total: 2.8 g/dL (ref 1.5–4.5)
Glucose: 394 mg/dL — ABNORMAL HIGH (ref 70–99)
Potassium: 5 mmol/L (ref 3.5–5.2)
Sodium: 134 mmol/L (ref 134–144)
Total Protein: 7 g/dL (ref 6.0–8.5)
eGFR: 57 mL/min/1.73 — ABNORMAL LOW (ref >59)

## 2023-09-28 LAB — CK: Total CK: 161 U/L (ref 32–182)

## 2023-09-29 ENCOUNTER — Ambulatory Visit (INDEPENDENT_AMBULATORY_CARE_PROVIDER_SITE_OTHER): Payer: Self-pay | Admitting: Primary Care

## 2023-09-30 ENCOUNTER — Other Ambulatory Visit: Payer: Self-pay | Admitting: Pharmacist

## 2023-09-30 ENCOUNTER — Other Ambulatory Visit: Payer: Self-pay

## 2023-09-30 ENCOUNTER — Telehealth: Payer: Self-pay | Admitting: Pharmacist

## 2023-09-30 MED ORDER — ROSUVASTATIN CALCIUM 20 MG PO TABS
20.0000 mg | ORAL_TABLET | Freq: Every day | ORAL | 3 refills | Status: AC
  Filled 2023-09-30 – 2024-01-09 (×2): qty 90, 90d supply, fill #0

## 2023-09-30 NOTE — Telephone Encounter (Signed)
 Call placed to patient to review results. Renal function stable. Good to continue valsartan  at 80 mg daily. Liver enzymes have normalized. Glucose continues to be elevated but we are addressing this and insulin  regimen was modified in clinic Friday, 09/27/23.   Of note, pt endorsed muscle cramping. CK levels were checked an nl. She is on rosuvastatin  + fenofibrate . I asked her to stop these over the weekend to see if symptoms improved. She did not experience cramps on Saturday but did yesterday. I will have her restart rosuvastatin  at a reduced dose of 20 mg daily. Rxn sent. Pt to continue this and the fenofibrate . We will continue this combination to decrease both LDL and TG levels. Once TG levels are at goal, we can trial her on the statin alone to see if this assists in mitigating muscle pain/fatigue.

## 2023-10-09 ENCOUNTER — Other Ambulatory Visit: Payer: Self-pay

## 2023-10-22 ENCOUNTER — Ambulatory Visit (INDEPENDENT_AMBULATORY_CARE_PROVIDER_SITE_OTHER): Payer: Self-pay | Admitting: Primary Care

## 2023-10-25 ENCOUNTER — Other Ambulatory Visit: Payer: Self-pay

## 2023-11-07 ENCOUNTER — Other Ambulatory Visit: Payer: Self-pay | Admitting: Primary Care

## 2023-11-07 DIAGNOSIS — E039 Hypothyroidism, unspecified: Secondary | ICD-10-CM

## 2023-11-08 ENCOUNTER — Ambulatory Visit: Payer: Self-pay | Admitting: Pharmacist

## 2023-11-08 NOTE — Telephone Encounter (Signed)
 Will forward to provider

## 2023-11-11 MED ORDER — LEVOTHYROXINE SODIUM 150 MCG PO TABS
150.0000 ug | ORAL_TABLET | Freq: Every day | ORAL | 0 refills | Status: DC
  Filled 2023-11-11: qty 30, 30d supply, fill #0

## 2023-11-12 ENCOUNTER — Other Ambulatory Visit: Payer: Self-pay

## 2023-11-22 ENCOUNTER — Other Ambulatory Visit: Payer: Self-pay

## 2023-11-26 ENCOUNTER — Ambulatory Visit: Payer: Self-pay | Admitting: Pharmacist

## 2023-12-02 ENCOUNTER — Encounter (INDEPENDENT_AMBULATORY_CARE_PROVIDER_SITE_OTHER): Payer: Self-pay | Admitting: Primary Care

## 2023-12-02 ENCOUNTER — Ambulatory Visit (INDEPENDENT_AMBULATORY_CARE_PROVIDER_SITE_OTHER): Payer: Self-pay | Admitting: Primary Care

## 2023-12-02 VITALS — BP 140/88 | HR 74 | Resp 16 | Wt 166.4 lb

## 2023-12-02 DIAGNOSIS — Z794 Long term (current) use of insulin: Secondary | ICD-10-CM

## 2023-12-02 DIAGNOSIS — E1142 Type 2 diabetes mellitus with diabetic polyneuropathy: Secondary | ICD-10-CM

## 2023-12-02 DIAGNOSIS — E039 Hypothyroidism, unspecified: Secondary | ICD-10-CM

## 2023-12-02 DIAGNOSIS — E559 Vitamin D deficiency, unspecified: Secondary | ICD-10-CM

## 2023-12-02 DIAGNOSIS — N289 Disorder of kidney and ureter, unspecified: Secondary | ICD-10-CM

## 2023-12-02 DIAGNOSIS — E782 Mixed hyperlipidemia: Secondary | ICD-10-CM

## 2023-12-02 DIAGNOSIS — I10 Essential (primary) hypertension: Secondary | ICD-10-CM

## 2023-12-02 DIAGNOSIS — Z7985 Long-term (current) use of injectable non-insulin antidiabetic drugs: Secondary | ICD-10-CM

## 2023-12-02 NOTE — Progress Notes (Signed)
 [facility]  Jaiyla Granados, is a 43 y.o. female  RDW:[id number]  FMW:[id number]  DOB - 11/23/1980  Chief Complaint  Patient presents with   Diabetes   Hypertension   Hypothyroidism       Subjective:   Sabah Zucco is a 43 y.o. Hispanic female speaks and understands English well.  She is here today for a follow up visit for the management of hypothyroidism currently on 150 mcg on empty stomach daily.  She denies  decreased memory/brain fog. We are going to start you on a new medication for thyroid . Please take it on an empty stomach 63mins-1 hour before food to have the most effective absorption.  .  Hypertension patient has No headache, No chest pain, No abdominal pain - No Nausea, No new weakness tingling or numbness, No Cough - shortness of breath   No problems updated.  Comprehensive ROS Pertinent positive and negative noted in HPI   No Known Allergies  Past Medical History:  Diagnosis Date   Diabetes mellitus without complication (HCC)    type 2   Gestational diabetes    Gestational hypertension, third trimester 11/14/2020   High cholesterol    Hyperthyroidism    Kidney stone complicating pregnancy, first trimester 06/06/2020   Marginal insertion of umbilical cord affecting management of mother 08/14/2020    Current Outpatient Medications on File Prior to Visit  Medication Sig Dispense Refill   acetaminophen  (TYLENOL ) 500 MG tablet Take 2 tablets (1,000 mg total) by mouth every 6 (six) hours. 30 tablet 0   Dulaglutide  (TRULICITY ) 1.5 MG/0.5ML SOAJ Inject 1.5 mg into the skin once a week. 6 mL 1   ergocalciferol  (VITAMIN D2) 1.25 MG (50000 UT) capsule Take 1 capsule (50,000 Units total) by mouth once a week. 10 capsule 0   fenofibrate  (TRICOR ) 145 MG tablet Take 1 tablet (145 mg total) by mouth daily.Stop Lovaza . Stop gemfibrozil . 90 tablet 1   glucose blood (TRUE METRIX BLOOD GLUCOSE TEST) test strip Use as instructed. Check blood  glucose levels twice per day. 100 each 12   Insulin  Glargine (BASAGLAR  KWIKPEN) 100 UNIT/ML Inject 22 Units into the skin daily. 15 mL 1   insulin  lispro (HUMALOG  KWIKPEN) 100 UNIT/ML KwikPen Inject 4 Units into the skin daily before supper. 3 mL 2   Insulin  Pen Needle (TECHLITE PEN NEEDLES) 32G X 4 MM MISC Use to inject insulin  up to 4 times daily as directed. 100 each 3   Insulin  Pen Needle 32G X 4 MM MISC Use three times daily with Humalog . 100 each 11   levothyroxine  (SYNTHROID ) 150 MCG tablet Take 1 tablet (150 mcg total) by mouth daily before breakfast. 30 tablet 0   rosuvastatin  (CRESTOR ) 20 MG tablet Take 1 tablet (20 mg total) by mouth daily. 90 tablet 3   TRUEplus Lancets 28G MISC Use as instructed. Check blood glucose levels twice per day. 100 each 3   valsartan  (DIOVAN ) 80 MG tablet Take 1 tablet (80 mg total) by mouth daily. 30 tablet 3   [DISCONTINUED] insulin  aspart (NOVOLOG ) 100 UNIT/ML FlexPen Inject 8 Units into the skin 3 (three) times daily with meals. 15 mL 11   No current facility-administered medications on file prior to visit.   Health Maintenance  Topic Date Due   Eye exam for diabetics  Never done   Hepatitis B Vaccine (1 of 3 - 19+ 3-dose series) Never done   HPV Vaccine (1 - 3-dose SCDM series) Never done   Pneumococcal Vaccine (2 of  2 - PCV) 06/28/2018   Breast Cancer Screening  Never done   Complete foot exam   02/06/2023   Flu Shot  08/23/2023   COVID-19 Vaccine (2 - 2025-26 season) 09/23/2023   Hemoglobin A1C  03/26/2024   Yearly kidney health urinalysis for diabetes  06/19/2024   Yearly kidney function blood test for diabetes  09/26/2024   Pap with HPV screening  03/08/2026   DTaP/Tdap/Td vaccine (3 - Td or Tdap) 12/12/2032   Hepatitis C Screening  Completed   HIV Screening  Completed   Meningitis B Vaccine  Aged Out    Objective:   Vitals:   12/02/23 1115  BP: (!) 147/86  Pulse: 74  Resp: 16  SpO2: 99%  Weight: 166 lb 6.4 oz (75.5 kg)   BP  Readings from Last 3 Encounters:  12/02/23 (!) 140/88  10/08/23 119/76  08/22/23 (!) 143/87      Physical Exam Vitals reviewed.  Constitutional:      Appearance: Normal appearance.  HENT:     Head: Normocephalic.     Right Ear: Tympanic membrane, ear canal and external ear normal.     Left Ear: Tympanic membrane, ear canal and external ear normal.     Nose: Nose normal.     Mouth/Throat:     Mouth: Mucous membranes are moist.  Eyes:     Extraocular Movements: Extraocular movements intact.     Pupils: Pupils are equal, round, and reactive to light.  Cardiovascular:     Rate and Rhythm: Normal rate.  Pulmonary:     Effort: Pulmonary effort is normal.     Breath sounds: Normal breath sounds.  Abdominal:     General: Bowel sounds are normal.     Palpations: Abdomen is soft.  Musculoskeletal:        General: Normal range of motion.     Cervical back: Normal range of motion.  Skin:    General: Skin is warm and dry.  Neurological:     Mental Status: She is alert and oriented to person, place, and time.  Psychiatric:        Mood and Affect: Mood normal.        Behavior: Behavior normal.        Thought Content: Thought content normal.      Assessment & Plan  Maebelle was seen today for diabetes, hypertension and hypothyroidism.  Diagnoses and all orders for this visit:  Abnormal kidney function -     CMP14+EGFR  Type 2 diabetes mellitus with diabetic polyneuropathy, with long-term current use of insulin  (HCC) - educated on lifestyle modifications, including but not limited to diet choices and adding exercise to daily routine.    Essential hypertension BP goal - < 130/80 Explained that having normal blood pressure is the goal and medications are helping to get to goal and maintain normal blood pressure. DIET: Limit salt intake, read nutrition labels to check salt content, limit fried and high fatty foods  Avoid using multisymptom OTC cold preparations that generally  contain sudafed which can rise BP. Consult with pharmacist on best cold relief products to use for persons with HTN EXERCISE Discussed incorporating exercise such as walking - 30 minutes most days of the week and can do in 10 minute intervals    -     CMP14+EGFR  Acquired hypothyroidism -     TSH + free T4  Mixed hyperlipidemia -     Lipid Panel  Vitamin D  deficiency -  VITAMIN D  25 Hydroxy (Vit-D Deficiency, Fractures); Future    Patient have been counseled extensively about nutrition and exercise. Other issues discussed during this visit include: low cholesterol diet, weight control and daily exercise, foot care, annual eye examinations at Ophthalmology, importance of adherence with medications and regular follow-up. We also discussed long term complications of uncontrolled diabetes and hypertension.   No follow-ups on file.  The patient was given clear instructions to go to ER or return to medical center if symptoms don't improve, worsen or new problems develop. The patient verbalized understanding. The patient was told to call to get lab results if they haven't heard anything in the next week.   This note has been created with Education officer, environmental. Any transcriptional errors are unintentional.   Rosaline SHAUNNA Bohr, NP 12/02/2023, 12:17 PM

## 2023-12-03 ENCOUNTER — Other Ambulatory Visit: Payer: Self-pay

## 2023-12-10 ENCOUNTER — Encounter: Payer: Self-pay | Admitting: Pharmacist

## 2023-12-10 ENCOUNTER — Ambulatory Visit: Payer: Self-pay | Attending: Primary Care | Admitting: Pharmacist

## 2023-12-10 VITALS — BP 118/78 | HR 79

## 2023-12-10 DIAGNOSIS — Z7985 Long-term (current) use of injectable non-insulin antidiabetic drugs: Secondary | ICD-10-CM

## 2023-12-10 DIAGNOSIS — I10 Essential (primary) hypertension: Secondary | ICD-10-CM

## 2023-12-10 DIAGNOSIS — E1142 Type 2 diabetes mellitus with diabetic polyneuropathy: Secondary | ICD-10-CM

## 2023-12-10 DIAGNOSIS — Z794 Long term (current) use of insulin: Secondary | ICD-10-CM

## 2023-12-10 DIAGNOSIS — E559 Vitamin D deficiency, unspecified: Secondary | ICD-10-CM

## 2023-12-10 DIAGNOSIS — E039 Hypothyroidism, unspecified: Secondary | ICD-10-CM

## 2023-12-10 NOTE — Progress Notes (Signed)
 S:    PCP: Rosaline Bohr   No chief complaint on file.  PMH significant for T2DM with neuropathy, HTN, HLD/hypertriglyceridemia/elevated lp(a), hypothyroidism.   Patient arrives well and in good spirits.  Presents for diabetes evaluation, education, and management. Patient is due for an A1c check today.   Patient was seen by her PCP on 12/02/23.   Pharmacy last saw her on 09/27/23. Her A1c was 14.4% at that visit. I increased both Basaglar  and Trulicity  doses. I also added Humalog  for her to take prior to her largest meal. CMP showed slight decline in eGFR after initiating valsartan  therapy. Of note, we also got a CK level given her muscle cramps and statin + fenofibrate  use. This level came back normal.   Today, pt reports in good spirits. Regarding DM, she continues to endorse readings in the 140s-160s at home. Denies any symptoms of hyperglycemia or hypoglycemia. She continues to take her fenofibrate  and rosuvastatin . Muscle cramping has resolved.   Regarding her HTN, she has taken valsartan  today. BP is at goal. Denies any side effects to the medication.   Lastly, she endorses adherence to her levothyroxine . She reports taking this consistently each day. She is due for thyroid  labs today.   Family/Social History:  -Fhx: unknown by the patient -Tobacco: never smoker -Alcohol: none reported  Insurance coverage/medication affordability: Self pay  Medication adherence reported.  Current diabetes medications include: Basaglar  22 units daily (at nighttime), Humalog  4 units before largest meal, Trulicity  1.5 mg weekly   Current HTN medications include: valsartan  80 mg daily Current antihyperlipidemic medications include: fenofibrate  145 mg daily, rosuvastatin  40 mg  Current hypothyroidism medications: levothyroxine  150 mcg daily   Patient-reported glucose readings:  Fasting) 140s - 160s  Post-prandial) does not check   Patient denies hypoglycemia symptoms.   Patient reports  polyuria (frequent urination). Going to the bathroom a lot, drinks a lot of water . Not a lot of changes from baseline.  Patient denies neuropathy.  Patient denies changes in vision. Sees an eye doctor frequently.  Patient reports foot exams.   Exercise: Walks, 30-45 minutes a few times a week.   Diet: 2-3 meals a day. Largest meal is lunch. No changes in appetite.   O:   Lab Results  Component Value Date   HGBA1C 14.4 (A) 09/27/2023   Vitals:   12/10/23 1610  BP: 118/78  Pulse: 79    Lipid Panel     Component Value Date/Time   CHOL 258 (H) 08/22/2023 0951   TRIG 857 (HH) 08/22/2023 0951   HDL 31 (L) 08/22/2023 0951   CHOLHDL 8.3 (H) 08/22/2023 0951   LDLCALC Comment (A) 08/22/2023 0951   Clinical Atherosclerotic Cardiovascular Disease (ASCVD): No  The 10-year ASCVD risk score (Arnett DK, et al., 2019) is: 6.4%   Values used to calculate the score:     Age: 43 years     Clincally relevant sex: Female     Is Non-Hispanic African American: No     Diabetic: Yes     Tobacco smoker: No     Systolic Blood Pressure: 118 mmHg     Is BP treated: Yes     HDL Cholesterol: 31 mg/dL     Total Cholesterol: 258 mg/dL   A/P: Diabetes longstanding currently uncontrolled, with an A1c today of 14.4%. Patient has been adherent to the current regimen; however, their A1c appears to be driven primarily by post-prandial glucose spikes. Initiation of bolus insulin  may help address these spikes and  further optimize glycemic control. Patient is able to verbalize appropriate hypoglycemia management plan. Medication adherence is optimal.  -Increase Basaglar  to 22 units once daily in the morning.  -Increase to Trulicity  1.5 mg once weekly.  -START Humalog  4 units daily before lunch.  -Labs: CMP+eGFR today.  -Extensively discussed pathophysiology of diabetes, recommended lifestyle interventions, dietary effects on blood sugar control -Counseled on s/sx of and management of hypoglycemia -Next A1C  anticipated 12/2023  Hypertension longstanding, currently at goal with her BP today. Commended patient for this! She is adherent to her valsartan  and reports taking it in the afternoons.  - Continue valsartan  80 mg daily.  - CMP14+eGFR  ASCVD risk - primary prevention in patient with diabetes. Last LDL was not calculated due to severe hypertriglyceridemia. LDL is not at goal of <70 mg/dL. She is adherent to rosuvastatin  + fenofibrate .  - Continue fenofibrate  145 mg daily - Continue rosuvastatin  40 mg daily  - Lipid.   Checking TSH today per PCP.     Written patient instructions provided.  Total time in face to face counseling 45 minutes.   Follow up PCP Clinic Visit: 1 month  Herlene Fleeta Morris, PharmD, [place], CPP Clinical Pharmacist [facility] & [facility] [phone]

## 2023-12-11 LAB — LIPID PANEL
Chol/HDL Ratio: 4.1 ratio (ref 0.0–4.4)
Cholesterol, Total: 195 mg/dL (ref 100–199)
HDL: 47 mg/dL (ref >39)
LDL Chol Calc (NIH): 120 mg/dL — ABNORMAL HIGH (ref 0–99)
Triglycerides: 159 mg/dL — ABNORMAL HIGH (ref 0–149)
VLDL Cholesterol Cal: 28 mg/dL (ref 5–40)

## 2023-12-11 LAB — CMP14+EGFR
ALT: 22 IU/L (ref 0–32)
AST: 18 IU/L (ref 0–40)
Albumin: 4.3 g/dL (ref 3.9–4.9)
Alkaline Phosphatase: 82 IU/L (ref 41–116)
BUN/Creatinine Ratio: 22 (ref 9–23)
BUN: 22 mg/dL (ref 6–24)
Bilirubin Total: 0.3 mg/dL (ref 0.0–1.2)
CO2: 21 mmol/L (ref 20–29)
Calcium: 9.6 mg/dL (ref 8.7–10.2)
Chloride: 103 mmol/L (ref 96–106)
Creatinine, Ser: 0.99 mg/dL (ref 0.57–1.00)
Globulin, Total: 2.7 g/dL (ref 1.5–4.5)
Glucose: 266 mg/dL — ABNORMAL HIGH (ref 70–99)
Potassium: 4.6 mmol/L (ref 3.5–5.2)
Sodium: 138 mmol/L (ref 134–144)
Total Protein: 7 g/dL (ref 6.0–8.5)
eGFR: 73 mL/min/1.73 (ref >59)

## 2023-12-11 LAB — TSH+FREE T4
Free T4: 1.35 ng/dL (ref 0.82–1.77)
TSH: 2.06 u[IU]/mL (ref 0.450–4.500)

## 2023-12-11 LAB — VITAMIN D 25 HYDROXY (VIT D DEFICIENCY, FRACTURES): Vit D, 25-Hydroxy: 24.7 ng/mL — ABNORMAL LOW (ref 30.0–100.0)

## 2023-12-16 ENCOUNTER — Ambulatory Visit (INDEPENDENT_AMBULATORY_CARE_PROVIDER_SITE_OTHER): Payer: Self-pay | Admitting: Primary Care

## 2023-12-16 ENCOUNTER — Other Ambulatory Visit (INDEPENDENT_AMBULATORY_CARE_PROVIDER_SITE_OTHER): Payer: Self-pay | Admitting: Primary Care

## 2023-12-16 ENCOUNTER — Other Ambulatory Visit: Payer: Self-pay

## 2023-12-16 DIAGNOSIS — E039 Hypothyroidism, unspecified: Secondary | ICD-10-CM

## 2023-12-16 MED ORDER — FENOFIBRATE 145 MG PO TABS
145.0000 mg | ORAL_TABLET | Freq: Every day | ORAL | 1 refills | Status: AC
  Filled 2023-12-16: qty 90, 90d supply, fill #0

## 2023-12-17 ENCOUNTER — Other Ambulatory Visit: Payer: Self-pay

## 2023-12-17 MED ORDER — LEVOTHYROXINE SODIUM 150 MCG PO TABS
150.0000 ug | ORAL_TABLET | Freq: Every day | ORAL | 3 refills | Status: AC
  Filled 2023-12-17: qty 60, 60d supply, fill #0
  Filled 2024-02-12: qty 60, 60d supply, fill #1

## 2023-12-17 NOTE — Telephone Encounter (Signed)
 Requested Prescriptions  Pending Prescriptions Disp Refills   levothyroxine  (SYNTHROID ) 150 MCG tablet 30 tablet 0    Sig: Take 1 tablet (150 mcg total) by mouth daily before breakfast.     Endocrinology:  Hypothyroid Agents Passed - 12/17/2023  4:31 PM      Passed - TSH in normal range and within 360 days    TSH  Date Value Ref Range Status  12/10/2023 2.060 0.450 - 4.500 uIU/mL Final         Passed - Valid encounter within last 12 months    Recent Outpatient Visits           1 week ago Type 2 diabetes mellitus with diabetic polyneuropathy, with long-term current use of insulin  (HCC)   [facility] Dept Of [place]. [facility] Fleeta Morris, [place] L, RPH-CPP   2 weeks ago Abnormal kidney function   [place] [facility] Celestia Rosaline SQUIBB, NP   2 months ago Type 2 diabetes mellitus with diabetic polyneuropathy, with long-term current use of insulin  [facility])   [facility] Dept Of [place]. [facility] Fleeta Morris, Serena L, RPH-CPP   3 months ago Type 2 diabetes mellitus with diabetic polyneuropathy, with long-term current use of insulin  [facility])   [facility] A Dept Of [place]. [facility] Fleeta Morris Garnette LITTIE, RPH-CPP   4 months ago Acquired hypothyroidism   [facility] - A Dept Of [place]. [facility] Fleeta Morris Garnette LITTIE, RPH-CPP

## 2023-12-18 ENCOUNTER — Other Ambulatory Visit: Payer: Self-pay

## 2023-12-18 ENCOUNTER — Other Ambulatory Visit: Payer: Self-pay | Admitting: Pharmacist

## 2023-12-18 DIAGNOSIS — E559 Vitamin D deficiency, unspecified: Secondary | ICD-10-CM

## 2023-12-18 MED ORDER — ERGOCALCIFEROL 1.25 MG (50000 UT) PO CAPS
50000.0000 [IU] | ORAL_CAPSULE | ORAL | 0 refills | Status: AC
  Filled 2023-12-18: qty 10, 70d supply, fill #0

## 2023-12-26 ENCOUNTER — Other Ambulatory Visit: Payer: Self-pay

## 2023-12-27 ENCOUNTER — Encounter (INDEPENDENT_AMBULATORY_CARE_PROVIDER_SITE_OTHER): Payer: Self-pay

## 2024-01-09 ENCOUNTER — Other Ambulatory Visit: Payer: Self-pay

## 2024-01-09 ENCOUNTER — Encounter: Payer: Self-pay | Admitting: Pharmacist

## 2024-01-09 ENCOUNTER — Ambulatory Visit: Payer: Self-pay | Attending: Family Medicine | Admitting: Pharmacist

## 2024-01-09 DIAGNOSIS — Z7985 Long-term (current) use of injectable non-insulin antidiabetic drugs: Secondary | ICD-10-CM

## 2024-01-09 DIAGNOSIS — I10 Essential (primary) hypertension: Secondary | ICD-10-CM

## 2024-01-09 DIAGNOSIS — Z794 Long term (current) use of insulin: Secondary | ICD-10-CM

## 2024-01-09 DIAGNOSIS — I2510 Atherosclerotic heart disease of native coronary artery without angina pectoris: Secondary | ICD-10-CM

## 2024-01-09 DIAGNOSIS — E1142 Type 2 diabetes mellitus with diabetic polyneuropathy: Secondary | ICD-10-CM

## 2024-01-09 DIAGNOSIS — E119 Type 2 diabetes mellitus without complications: Secondary | ICD-10-CM

## 2024-01-09 DIAGNOSIS — E039 Hypothyroidism, unspecified: Secondary | ICD-10-CM

## 2024-01-09 LAB — POCT GLYCOSYLATED HEMOGLOBIN (HGB A1C): HbA1c, POC (controlled diabetic range): 10.6 % — AB (ref 0.0–7.0)

## 2024-01-09 MED ORDER — BASAGLAR KWIKPEN 100 UNIT/ML ~~LOC~~ SOPN
28.0000 [IU] | PEN_INJECTOR | Freq: Every day | SUBCUTANEOUS | 1 refills | Status: AC
  Filled 2024-01-09: qty 9, 32d supply, fill #0

## 2024-01-09 NOTE — Progress Notes (Signed)
 S:    PCP: Rosaline Bohr   No chief complaint on file.  PMH significant for T2DM with neuropathy, HTN, HLD/hypertriglyceridemia/elevated lp(a), hypothyroidism.   Patient arrives well and in good spirits.  Presents for diabetes evaluation, education, and management.   Patient was seen by her PCP on 12/02/23. I last saw her on 12/10/2023. Labs at that visit showed normal renal function. TSH and T4 labs also showed euthyroid with levothyroxine  150 mcg daily. BP showed good control.   Today, pt reports in good spirits. Regarding DM, her A1c is 10.6%, down from 14.4% prior. She continues to endorse fasting readings in the 140s-160s at home. Denies any symptoms of hyperglycemia or hypoglycemia. She continues to take her fenofibrate  and rosuvastatin  for HLD and hypertriglyceridemia.   She endorses adherence to her levothyroxine . She reports taking this consistently each day.   Family/Social History:  -Fhx: unknown by the patient -Tobacco: never smoker -Alcohol: none reported  Insurance coverage/medication affordability: Self pay  Medication adherence reported.  Current diabetes medications include: Basaglar  22 units daily (at nighttime), Humalog  4 units before largest meal, Trulicity  1.5 mg weekly   Current HTN medications include: valsartan  80 mg daily Current antihyperlipidemic medications include: fenofibrate  145 mg daily, rosuvastatin  40 mg  Current hypothyroidism medications: levothyroxine  150 mcg daily   Patient-reported glucose readings:  Fasting) 150s - 160s. Lowest is 145  Post-prandial) does not check   Patient denies hypoglycemia symptoms.   Patient reports polyuria (frequent urination). Going to the bathroom a lot, drinks a lot of water . Not a lot of changes from baseline.  Patient denies neuropathy.  Patient denies changes in vision. Sees an eye doctor frequently.  Patient reports foot exams.   Exercise: Walks, 30-45 minutes a few times a week.   Diet: 2-3  meals a day. Largest meal is lunch. No changes in appetite.   O:   Lab Results  Component Value Date   HGBA1C 10.6 (A) 01/09/2024   There were no vitals filed for this visit.   Lipid Panel     Component Value Date/Time   CHOL 195 12/10/2023 1619   TRIG 159 (H) 12/10/2023 1619   HDL 47 12/10/2023 1619   CHOLHDL 4.1 12/10/2023 1619   LDLCALC 120 (H) 12/10/2023 1619   Clinical Atherosclerotic Cardiovascular Disease (ASCVD): No  The 10-year ASCVD risk score (Arnett DK, et al., 2019) is: 1.8%   Values used to calculate the score:     Age: 43 years     Clinically relevant sex: Female     Is Non-Hispanic African American: No     Diabetic: Yes     Tobacco smoker: No     Systolic Blood Pressure: 118 mmHg     Is BP treated: Yes     HDL Cholesterol: 47 mg/dL     Total Cholesterol: 195 mg/dL   A/P: Diabetes longstanding currently uncontrolled, with an A1c today of 10.6%. This is down from 14.4% in September. Patient has been adherent to the current regimen. Will further optimize basal insulin  given her fasting sugar readings at home. Will continue current dose of bolus insulin  and Trulicity  once weekly. Patient is able to verbalize appropriate hypoglycemia management plan. Medication adherence is optimal.  -INCREASE Basaglar  to 28 units once daily in the morning.  -Continue Trulicity  1.5 mg once weekly.  -Continue Humalog  4 units daily before lunch.  -Extensively discussed pathophysiology of diabetes, recommended lifestyle interventions, dietary effects on blood sugar control -Counseled on s/sx of and management of  hypoglycemia -Next A1C anticipated 03/2024  ASCVD risk - primary prevention in patient with diabetes. Last LDL was 120mg /dL. LDL goal of <70 mg/dL. TG level has improved from 1164 mg/dL to 840 mg/dL from 4/70 - 88/81/7974. She is adherent to rosuvastatin  + fenofibrate .  - Continue fenofibrate  145 mg daily - Continue rosuvastatin  40 mg daily   Written patient instructions  provided.  Total time in face to face counseling 45 minutes.    Follow up w/ me in 6-8 weeks.   Audrey Mcmillan, PharmD, [name], CPP Clinical Pharmacist [facility] [phone]

## 2024-02-13 ENCOUNTER — Other Ambulatory Visit: Payer: Self-pay

## 2024-02-14 ENCOUNTER — Other Ambulatory Visit: Payer: Self-pay

## 2024-03-12 ENCOUNTER — Ambulatory Visit: Payer: Self-pay | Admitting: Pharmacist
